# Patient Record
Sex: Female | Born: 1935 | Race: White | Hispanic: No | Smoking: Never smoker
Health system: Southern US, Community
[De-identification: ages and names within clinical notes are randomized; demographics above are authoritative.]

## PROBLEM LIST (undated history)

## (undated) DIAGNOSIS — I10 Essential (primary) hypertension: Secondary | ICD-10-CM

## (undated) DIAGNOSIS — E785 Hyperlipidemia, unspecified: Secondary | ICD-10-CM

## (undated) DIAGNOSIS — H3530 Unspecified macular degeneration: Secondary | ICD-10-CM

## (undated) DIAGNOSIS — M810 Age-related osteoporosis without current pathological fracture: Secondary | ICD-10-CM

## (undated) DIAGNOSIS — K922 Gastrointestinal hemorrhage, unspecified: Secondary | ICD-10-CM

## (undated) DIAGNOSIS — M48061 Spinal stenosis, lumbar region without neurogenic claudication: Secondary | ICD-10-CM

## (undated) HISTORY — PX: JOINT REPLACEMENT: SHX530

## (undated) HISTORY — PX: ABDOMINAL HYSTERECTOMY: SHX81

---

## 2004-11-13 ENCOUNTER — Ambulatory Visit: Payer: Self-pay

## 2005-06-28 ENCOUNTER — Ambulatory Visit: Payer: Self-pay | Admitting: Internal Medicine

## 2006-07-04 ENCOUNTER — Ambulatory Visit: Payer: Self-pay | Admitting: Internal Medicine

## 2007-07-09 ENCOUNTER — Ambulatory Visit: Payer: Self-pay | Admitting: Internal Medicine

## 2008-07-13 ENCOUNTER — Ambulatory Visit: Payer: Self-pay | Admitting: Internal Medicine

## 2009-06-27 ENCOUNTER — Ambulatory Visit: Payer: Self-pay | Admitting: Internal Medicine

## 2009-08-02 ENCOUNTER — Ambulatory Visit: Payer: Self-pay | Admitting: Pain Medicine

## 2009-08-30 ENCOUNTER — Ambulatory Visit: Payer: Self-pay | Admitting: Pain Medicine

## 2009-09-09 ENCOUNTER — Ambulatory Visit: Payer: Self-pay | Admitting: Internal Medicine

## 2009-09-12 ENCOUNTER — Ambulatory Visit: Payer: Self-pay | Admitting: Pain Medicine

## 2010-01-03 ENCOUNTER — Ambulatory Visit: Payer: Self-pay | Admitting: Pain Medicine

## 2010-01-19 ENCOUNTER — Ambulatory Visit: Payer: Self-pay | Admitting: Pain Medicine

## 2010-09-27 ENCOUNTER — Ambulatory Visit: Payer: Self-pay | Admitting: Internal Medicine

## 2011-12-20 LAB — COMPREHENSIVE METABOLIC PANEL
Alkaline Phosphatase: 86 U/L (ref 50–136)
BUN: 23 mg/dL — ABNORMAL HIGH (ref 7–18)
Bilirubin,Total: 0.6 mg/dL (ref 0.2–1.0)
Calcium, Total: 9.9 mg/dL (ref 8.5–10.1)
Co2: 27 mmol/L (ref 21–32)
EGFR (Non-African Amer.): >60
Glucose: 108 mg/dL — ABNORMAL HIGH (ref 65–99)
Osmolality: 284 (ref 275–301)
SGOT(AST): 20 U/L (ref 15–37)
SGPT (ALT): 18 U/L
Total Protein: 8.3 g/dL — ABNORMAL HIGH (ref 6.4–8.2)

## 2011-12-20 LAB — CBC
HCT: 44.7 % (ref 35.0–47.0)
MCV: 93 fL (ref 80–100)

## 2011-12-21 ENCOUNTER — Observation Stay: Payer: Self-pay | Admitting: Internal Medicine

## 2011-12-21 LAB — URINALYSIS, COMPLETE
Blood: NEGATIVE
Glucose,UR: NEGATIVE mg/dL (ref 0–75)
Nitrite: POSITIVE
RBC,UR: 5 /HPF (ref 0–5)
Specific Gravity: 1.009 (ref 1.003–1.030)
Squamous Epithelial: 2

## 2011-12-22 LAB — CBC WITH DIFFERENTIAL/PLATELET
Basophil %: 0.2 %
Eosinophil #: 0 10*3/uL (ref 0.0–0.7)
Eosinophil %: 0.1 %
HCT: 35.2 % (ref 35.0–47.0)
Lymphocyte #: 2 10*3/uL (ref 1.0–3.6)
MCHC: 34.1 g/dL (ref 32.0–36.0)
MCV: 93 fL (ref 80–100)
Monocyte #: 0.8 x10 3/mm (ref 0.2–0.9)
Neutrophil %: 75.3 %
Platelet: 320 10*3/uL (ref 150–440)
RBC: 3.8 10*6/uL (ref 3.80–5.20)
WBC: 11.6 10*3/uL — ABNORMAL HIGH (ref 3.6–11.0)

## 2012-06-03 ENCOUNTER — Inpatient Hospital Stay: Payer: Self-pay | Admitting: Orthopedic Surgery

## 2012-06-03 DIAGNOSIS — I10 Essential (primary) hypertension: Secondary | ICD-10-CM

## 2012-06-03 LAB — BASIC METABOLIC PANEL
Anion Gap: 10 (ref 7–16)
BUN: 29 mg/dL — ABNORMAL HIGH (ref 7–18)
Calcium, Total: 8.9 mg/dL (ref 8.5–10.1)
EGFR (Non-African Amer.): >60
Glucose: 107 mg/dL — ABNORMAL HIGH (ref 65–99)
Osmolality: 284 (ref 275–301)
Potassium: 3.5 mmol/L (ref 3.5–5.1)
Sodium: 139 mmol/L (ref 136–145)

## 2012-06-03 LAB — URINALYSIS, COMPLETE
Bilirubin,UR: NEGATIVE
Blood: NEGATIVE
Glucose,UR: NEGATIVE mg/dL (ref 0–75)
Ketone: NEGATIVE
Leukocyte Esterase: NEGATIVE
Nitrite: POSITIVE
Squamous Epithelial: NONE SEEN

## 2012-06-03 LAB — CBC
MCH: 31.5 pg (ref 26.0–34.0)
MCHC: 34.3 g/dL (ref 32.0–36.0)
Platelet: 251 10*3/uL (ref 150–440)
RBC: 4.6 10*6/uL (ref 3.80–5.20)

## 2012-06-04 LAB — CBC WITH DIFFERENTIAL/PLATELET
Basophil #: 0.1 10*3/uL (ref 0.0–0.1)
Eosinophil %: 0.1 %
HCT: 34.2 % — ABNORMAL LOW (ref 35.0–47.0)
HGB: 12.2 g/dL (ref 12.0–16.0)
Lymphocyte #: 0.8 10*3/uL — ABNORMAL LOW (ref 1.0–3.6)
Lymphocyte %: 7.1 %
MCH: 32.6 pg (ref 26.0–34.0)
MCHC: 35.6 g/dL (ref 32.0–36.0)
Monocyte %: 6.4 %
Neutrophil #: 10.2 10*3/uL — ABNORMAL HIGH (ref 1.4–6.5)
Neutrophil %: 85.9 %
Platelet: 203 10*3/uL (ref 150–440)
RBC: 3.74 10*6/uL — ABNORMAL LOW (ref 3.80–5.20)

## 2012-06-04 LAB — BASIC METABOLIC PANEL
Anion Gap: 10 (ref 7–16)
BUN: 21 mg/dL — ABNORMAL HIGH (ref 7–18)
Creatinine: 0.84 mg/dL (ref 0.60–1.30)
EGFR (African American): >60
EGFR (Non-African Amer.): 59 — ABNORMAL LOW
Glucose: 162 mg/dL — ABNORMAL HIGH (ref 65–99)
Sodium: 142 mmol/L (ref 136–145)

## 2012-06-06 LAB — URINE CULTURE

## 2012-06-07 ENCOUNTER — Encounter: Payer: Self-pay | Admitting: Internal Medicine

## 2012-06-20 LAB — URINALYSIS, COMPLETE
Bilirubin,UR: NEGATIVE
Blood: NEGATIVE
Hyaline Cast: 3
Ketone: NEGATIVE
Ph: 5 (ref 4.5–8.0)
RBC,UR: 6 /HPF (ref 0–5)
WBC UR: 42 /HPF (ref 0–5)

## 2012-06-22 LAB — URINE CULTURE

## 2012-06-27 ENCOUNTER — Encounter: Payer: Self-pay | Admitting: Internal Medicine

## 2012-07-13 LAB — URINALYSIS, COMPLETE
Bacteria: NONE SEEN
Bilirubin,UR: NEGATIVE
Blood: NEGATIVE
Glucose,UR: NEGATIVE mg/dL (ref 0–75)
Nitrite: NEGATIVE
Ph: 7 (ref 4.5–8.0)
RBC,UR: 1 /HPF (ref 0–5)
Specific Gravity: 1.016 (ref 1.003–1.030)
Squamous Epithelial: 3

## 2012-07-14 LAB — URINE CULTURE

## 2012-07-27 ENCOUNTER — Encounter: Payer: Self-pay | Admitting: Internal Medicine

## 2013-01-03 ENCOUNTER — Emergency Department: Payer: Self-pay | Admitting: Emergency Medicine

## 2013-06-04 ENCOUNTER — Emergency Department: Payer: Self-pay | Admitting: Emergency Medicine

## 2014-03-10 ENCOUNTER — Emergency Department: Payer: Self-pay | Admitting: Emergency Medicine

## 2014-12-14 NOTE — Consult Note (Signed)
PATIENT NAME:  SIEDAH, SEDOR MR#:  [id number] DATE OF BIRTH:  [birth year]  DATE OF CONSULTATION:  06/03/2012  REFERRING PHYSICIAN:  Dr. Rosita Kea  CONSULTING PHYSICIAN:  Shaune Pollack, MD  PRIMARY CARE PHYSICIAN: Yates Decamp, III, MD   REASON FOR CONSULTATION: Preop evaluation and medical management.   HISTORY OF PRESENT ILLNESS: The patient is a 81 year old Caucasian female with history of hypertension, hyperlipidemia, osteoporosis, osteoarthritis, glaucoma, and lumbar stenosis who was brought to the ED due to fall and she was noted to have a right hip fracture and Dr. Rosita Kea requested preop evaluation and medical management. I examined the patient at bedside. According to the patient's home healthcare giver, the patient fell by accident this morning. The patient usually uses a walker. She is living alone. Has a good appetite and daily activity recently. The patient denies any fever, chills, headache, or dizziness. No incontinence. No loss of consciousness. No dysphagia, slurred speech, or weakness. The patient has right hip pain. Denies any other symptoms.   PAST MEDICAL HISTORY:  1. Hypertension. 2. Hyperlipidemia.  3. Sarcoidosis.  4. Osteoporosis.  5. Glaucoma. 6. Macular degeneration. 7. Lumbar stenosis.  8. Degenerative disk disease.  9. Chronic back pain.   PAST SURGICAL HISTORY: 1. Repair of vaginal prolapse. 2. Laminectomy in 1995. 3. Bilateral knee replacements.  4. Lens implant.  5. Ovarian tumor resection, unknown benign or malignant.   SOCIAL HISTORY: No smoking, drinking, or illicit drugs. Living alone.   FAMILY HISTORY: Hypertension.   REVIEW OF SYSTEMS: CONSTITUTIONAL: The patient denies any fever, chills. No headache or dizziness. No weakness. EYES: No double vision or blurred vision. ENT: No postnasal drip, slurred speech, or dysphagia. CARDIOVASCULAR: No chest pain, palpitation, orthopnea, or nocturnal dyspnea. PULMONARY: No cough, sputum, shortness of breath, or  hemoptysis. GI: No abdominal pain, nausea, vomiting, or diarrhea. No melena or bloody stools. SKIN: No rash or jaundice. GU: No dysuria, hematuria, or incontinence. NEUROLOGY: No syncope, loss of consciousness, or seizure. HEMATOLOGY: No easy bruising or bleeding.   HOME MEDICATIONS:  1. PreserVision oral capsule one capsule twice daily.   2. Moexipril 15 mg p.o. daily.  3. Latanoprost 0.005% one drop to each eye at bedtime.  4. Fish Oil 1 cap b.i.d.  5. Chlorthalidone 25 mg p.o. once daily.  6. Centrum Silver p.o. tablet 1 tablet p.o. daily.  7. Aspirin 81 mg p.o. daily.  8. Norvasc 5 mg p.o. daily.  9. Acetaminophen/hydrocodone 325 mg/5 mg p.o. tablet 1 tablet once daily at bedtime p.r.n.   PHYSICAL EXAMINATION:   VITAL SIGNS: Temperature 97.6, blood pressure 136/60, pulse 74, respirations 18, oxygen saturation 94% on room air.   GENERAL: The patient is alert, awake, oriented in no acute distress.   HEENT: Pupils round, equal, reactive to light and accommodation. Mild dry oral mucosa. Clear oropharynx.   NECK: Supple. No JVD or carotid bruits. No lymphadenopathy. No thyromegaly.   CARDIOVASCULAR: S1, S2 regular rate and rhythm. No murmurs or gallops.   PULMONARY: Bilateral air entry. No wheezing or rales. No accessory muscles to breathe.   ABDOMEN: Soft. No distention or tenderness. No organomegaly. Bowel sounds present.   EXTREMITIES: No edema, clubbing, or cyanosis. No calf tenderness. The patient cannot move right lower extremity but can move left lower extremity. Sensation intact.   NEUROLOGY: Alert and oriented x3. No focal deficits.   LABORATORY, DIAGNOSTIC, AND RADIOLOGICAL DATA: WBC 17.1, hemoglobin 14.5, platelets 251, glucose 107, BUN 29, creatinine 0.72. Electrolytes are normal. Urinalysis is nitrite positive.  Chest x-ray no acute cardiopulmonary disease.   Pelvis x-ray showed right hip fracture.   Right hip x-ray showed right hip fracture.   CAT scan of head  showed atrophy with chronic microangiopathy. No acute intracranial abnormality.   EKG showed normal sinus rhythm with left axis deviation, right bundle branch block.   IMPRESSION:  1. Right hip fracture.  2. Fall.  3. Dehydration.  4. Leukocytosis.  5. Urinary tract infection.  6. Hypertension.  7. Hyperlipidemia.  8. Osteoarthritis.  9. Osteoporosis.   RECOMMENDATIONS:  1. The patient has hypertension, hyperlipidemia, urinary tract infection, has a moderate risk for hip fracture surgery.   2. The patient is to hold aspirin.  3. Since the patient has dehydration, we will hold chlorthalidone and give normal saline IV and follow-up BMP.  4. For urinary tract infection, leukocytosis, we will give Rocephin IVPB and follow-up CBC, urine culture.  5. For hypertension, will continue Norvasc and moexipril. 6. The patient needs DVT prophylaxis after surgery.  Discussed the patient's situation and recommendations with the patient and the patient's nephew.   TIME SPENT: About 60 minutes.   ____________________________ Shaune PollackQing [name], MD qc:drc D: 06/03/2012 16:20:33 ET T: 06/03/2012 16:46:01 ET JOB#: [id number]  cc: Shaune PollackQing Geselle Cardosa, MD, <Dictator> Shaune PollackQING Ramone Gander MD ELECTRONICALLY SIGNED 06/03/2012 20:34

## 2014-12-14 NOTE — H&P (Signed)
PATIENT NAME:  [name], [name] MR#:  [id number] DATE OF BIRTH:  June 01, 1913  DATE OF ADMISSION:  06/03/2012  CHIEF COMPLAINT: Right hip pain.  HISTORY OF PRESENT ILLNESS: The patient is a 81 year old who has been ambulatory around her home with a walker for some time. She is very alert and active. She has been generally healthy. She does not know why she fell. She thinks the hip may have broken first. She has had problems in the past with hip pain and bilateral hip pain. She had admission back in April because of concerns about possible hip fracture that was negative at that time. She had fall at home. Denies any loss of consciousness, chest pain, and has been evaluated by the ER staff and was found to have a minimally displaced intertrochanteric right hip fracture.   PAST MEDICAL HISTORY:  1. Sarcoidosis.  2. Hyperlipidemia.  3. Hypertension.  4. Osteoporosis.  5. Lumbar spinal stenosis. 6. Glaucoma.   7. Macular degeneration.  8. Prior bilateral knee replacement.  9. Eye surgery.   10. Prior laminectomy.  11. She had ovarian tumor resection in the past.   ALLERGIES: Septra, Celebrex, Naldecon.   MEDICATIONS ON ADMISSION:  1. PreserVision oral capsule one p.o. b.i.d.  2. Xalatan 0.005% eyedrops one drop to each eye at bedtime. 3. Moexipril 15 mg p.o. daily.  4. Fish Oil daily.   5. Chlorthalidone 25 mg daily.  6. Multivitamin daily.  7. Aspirin 81 mg p.o. daily.  8. Amlodipine 5 mg daily.  9. Vicodin 5 one p.o. as needed for pain.   SOCIAL HISTORY: She is a nonsmoker and lives alone.  REVIEW OF SYSTEMS: Positive just for the hip pain. Denies any other problem.   PHYSICAL EXAMINATION:   GENERAL: Slender white female who appears younger than her stated age in mild distress secondary to hip pain.   HEENT: Remarkable for teeth in good condition.   LUNGS: Clear.   HEART: Regular rate and rhythm.   ABDOMEN: Soft, nontender.  EXTREMITIES: Remarkable for external rotation of  the right lower extremity. She does not have significant edema of the right foot. She does have intact pulses and is able to flex and extend the toes. Skin over the right hip is intact.   X-rays show nondisplaced intertrochanteric hip fracture.   CLINICAL IMPRESSION: Right intertrochanteric hip fracture in a previously ambulatory woman.   RECOMMENDATION: Preop medical evaluation and ORIF today. She has not eaten pretty much all day and we could mobilize her tomorrow. Risks, benefits, and possible complications were discussed. Recommend repair so that she can return home in 3 to 6 weeks after rehab stay.    ____________________________ Leitha SchullerMichael J. Dquan Cortopassi, MD mjm:drc D: 06/03/2012 16:49:46 ET T: 06/03/2012 17:27:08 ET JOB#: [id number]  cc: Leitha SchullerMichael J. Almarie Kurdziel, MD, <Dictator> Leitha SchullerMICHAEL J Kaili Castille MD ELECTRONICALLY SIGNED 06/03/2012 17:55

## 2014-12-14 NOTE — Op Note (Signed)
PATIENT NAME:  [name], [name] MR#:  [id number] DATE OF BIRTH:  1913/04/15  DATE OF Della GooROCEDURE:  06/03/2012  PREOPERATIVE DIAGNOSIS: Right intertrochanteric hip fracture.   POSTOPERATIVE DIAGNOSIS: Right intertrochanteric hip fracture.   PROCEDURE: Right femur open reduction and internal fixation with intramedullary rod and screw.   SURGEON: Leitha SchullerMichael J. Ennis Delpozo, M.D.   ANESTHESIA: General.   DESCRIPTION OF PROCEDURE:  The patient was brought to the operating room and after adequate general anesthesia was obtained, the patient was transferred to the fracture table. The left leg was in the well legholder, right leg in the traction boot with minimal traction applied. The C-arm was brought in and essentially anatomic reduction was obtained with closed means. The hip was prepped and draped using the barrier drape method. Appropriate patient identification and time-out procedures were completed. A proximal incision was made proximal to the greater trochanter. A guidewire was inserted in a center position on the lateral to the tip of the trochanter. Proximal reaming was carried out over this with a soft tissue protector in place. A long guidewire was then inserted, and measuring off this the long [employer] rod was obtained. Reaming was carried out to 12 mm and then the right 130-degree 11 x 360-mm rod was inserted to the appropriate depth. A small lateral incision was made and through the guide a guidewire was inserted up into the center of the head in AP and lateral projections. This was measured, drilled, and 100-mm screw was inserted. After this set to the appropriate depth the guidewire was removed and the set screw tightened and then released to allow for compression and loading of the fracture site. The wounds were then thoroughly irrigated. The insertion handle was removed. The deep fascia was closed using #1 Vicryl, 2-0 Vicryl subcutaneously, and skin staples. Xeroform, 4 x 4's, abdomen, and tape were  applied. The patient was sent to the recovery room in stable condition.   ESTIMATED BLOOD LOSS: 100 mL.   COMPLICATIONS: None.   SPECIMEN: None.     ____________________________ Leitha SchullerMichael J. Miria Cappelli, MD mjm:bjt D: 06/03/2012 22:14:18 ET T: 06/04/2012 08:36:32 ET JOB#: [id number]  cc: Leitha SchullerMichael J. Authur Cubit, MD, <Dictator> Leitha SchullerMICHAEL J Philopater Mucha MD ELECTRONICALLY SIGNED 06/04/2012 13:12

## 2014-12-14 NOTE — Discharge Summary (Signed)
PATIENT NAME:  [name], Uriyah L MR#:  [id number] DATE OF BIRTH:  12-16-1912  DATE OF ADMISSION:  06/03/2012 DATE OF DISCHARGE:  06/06/2012  ADMITTING DIAGNOSIS: Right intertrochanteric hip fracture.   DISCHARGE DIAGNOSIS: Right intertrochanteric hip fracture.   PROCEDURE: Right femur open reduction internal fixation with intramedullary rod and screw.   SURGEON: Leitha SchullerMichael J. Menz, MD   HISTORY: The patient is a 81 year old who had been ambulatory around her home with a walker for sometime. She was very alert and active. She was generally healthy. She does not know why she fell. She thinks her hip may have broken first. She has had problems in the past with hip pain and bilateral hip pain. She had admission back in April because of concerns about possible hip fracture that was negative at the time. She had a fall home. She denies any loss of consciousness or chest pain and has been evaluated by the ER staff and was found to have a minimally displaced intertrochanteric right hip fracture.   PHYSICAL EXAMINATION: GENERAL: Slender white female who appears younger than her stated age, in mild distress secondary to hip pain. HEENT: Remarkable for teeth in good condition. LUNGS: Clear. HEART: Regular rate and rhythm. ABDOMEN: Soft, nontender. EXTREMITIES: Remarkable for external rotation of the right lower extremity. She does not have significant edema of the right foot. She does have intact pulses and is able to flex and extend the toes. Skin over the right hip is intact.   HOSPITAL COURSE: The patient was admitted to the hospital on 06/03/2012. She had surgery that same day and was brought to the orthopedic floor from the PAC-U in stable condition. for the next three days the patient had slow progress with physical therapy. Her labs and vital signs remained stable. Her pain was well controlled. On 06/06/2012, the patient was stable and ready for discharge to rehab.   CONDITION AT DISCHARGE: Stable.    DISCHARGE INSTRUCTIONS: The patient should wear TED hose thigh-high bilaterally. She should have log roll every two hours. She should keep the foot of the bed elevated at 35 degrees with knees slightly bent. Physical therapy should consult and evaluate for difficulty walking, right weight-bearing as tolerated. OT should evaluate and treat for muscle weakness and assistance with activities of daily living. She should resume a regular diet and she [facility]   DISCHARGE MEDICATIONS:  1. Multivitamin with minerals tablet 1 tablet oral daily.  2. Vicodin 5/325 mg 1 to 2 tablets oral every 6 hours p.r.n. pain.  3. Tylenol 500 to 1000 mg oral every 4 hours p.r.n. pain or temperature greater than 100.4.  4. Docusate calcium capsule 240 mg oral at bedtime. 5. Dulcolax suppository 10 mg rectal every 6 hours p.r.n. constipation.  6. Latanoprost 0.005% ophthalmic drops one drop to both eyes at bedtime.  7. Amlodipine tablet 5 mg oral daily. 8. Chlorthalidone tablet 25 mg oral daily. 9. Moexipril 15 mg oral daily. 10. Cipro 250 mg oral every 12 hours, stop after seven days.  11. Lovenox 40 mg daily x14 days.  ____________________________ Evon Slackhomas C. Dimples Probus, PA-C tcg:slb D: 06/06/2012 07:50:29 ET T: 06/06/2012 13:02:16 ET JOB#: [id number]  cc: Evon Slackhomas C. Juston Goheen, PA-C, <Dictator> Evon SlackHOMAS C Asia Dusenbury GeorgiaPA ELECTRONICALLY SIGNED 06/10/2012 12:36

## 2014-12-19 NOTE — Consult Note (Signed)
Brief Consult Note: Diagnosis: left hip bursitis.   Patient was seen by consultant.   Recommend further assessment or treatment.   Comments: 81 year old with increased left hip pain yesterday causing inability to walk.  Admitted for evaluation and treatment.  Had injection left hip bursa this AM by Dr Bethann PunchesMark Mardene Lessig and pain has resolved.  Saw Dr Rosita KeaMenz last week and had been taking otc naproxyn sodium bid.   Exam:  alert, oriented.  full range of motion of left hip without pain.  non tender both trochanteric regions now.  circulation/sensation/motor function good distally.  no pelvic or back tenderness.    X-rays: ct scan of hip showed no fractures and mild arthritis.   Rx:  Ice        Switch to mobic 7.5 mg/ day or Celebrex 200 mg/ day.         ambulate with Physical Therapy.        return to  clinic to see me or Dr Rosita KeaMenz as needed.  Electronic Signatures: Valinda HoarMiller, Malike Foglio E (MD)  (Signed 26-Apr-13 14:17)  Authored: Brief Consult Note   Last Updated: 26-Apr-13 14:17 by Valinda HoarMiller, Tressy Kunzman E (MD)

## 2014-12-19 NOTE — Discharge Summary (Signed)
PATIENT NAME:  [name], Leah L MR#:  Yu DATE OF BIRTH:  [birth year]  DATE OF ADMISSION:  12/21/2011 DATE OF DISCHARGE:  12/23/2011  NOTE: Patient was placed in observation, she was not admitted. She was placed in observation 12/21/2011, she was discharged 12/23/2011.   DISCHARGE DIAGNOSES:  1. Hip pain.  2. Urinary tract infection.   DISCHARGE MEDICATIONS:  1. Aspirin daily.  2. Multivitamin daily.  3. Fish oil daily.  4. Naproxen over-the-counter p.r.n.  5. Ceftin 250 b.i.d. for five days for urinary tract infection.   HISTORY AND PHYSICAL: Please see detailed history done on admission.   HOSPITAL COURSE: Patient was admitted by Dr. Hyacinth MeekerMiller and Dr. Tilman NeatWalker's patient. I saw her for a day or two on the weekend. She did well after injection of her hip by Dr. Hyacinth MeekerMiller. CT of her hip was negative for fracture. Urinalysis was done on admission but urine culture was never done. She is ambulating without difficulty by the time of discharge. No other complications.   ____________________________ Marya AmslerMarshall W. Dareen PianoAnderson, MD mwa:cms D: 01/01/2012 07:26:20 ET T: 01/01/2012 12:06:49 ET JOB#: [id number]  cc: Marya AmslerMarshall W. Dareen PianoAnderson, MD, <Dictator> Lauro RegulusMARSHALL W ANDERSON MD ELECTRONICALLY SIGNED 01/07/2012 7:00

## 2014-12-19 NOTE — H&P (Signed)
PATIENT NAME:  [name], [name] MR#:  [id number] DATE OF BIRTH:  04-16-1913  DATE OF ADMISSION:  12/21/2011  REFERRING PHYSICIAN: Dr. Buford Yu PRIMARY CARE PHYSICIAN: Dr. Yates DecampJohn Yu  PRESENTING COMPLAINT: Bilateral hip pain.   HISTORY OF PRESENT ILLNESS: Ms. [name] is a pleasant 81 year old woman with history of lumbar stenosis and degenerative disk disease with chronic low back pain, osteoarthritis, hypertension, hyperlipidemia, sarcoidosis, osteoporosis who presents with reports of bilateral hip pain. Patient reports that approximately 3 to 4 weeks ago she started having right knee pain with extension up to her right hip. She has been tolerating her pain and has been progressively worsened. She presented to Dr. Rosita Yu' office on 12/14/2011 for evaluation of hip pain who recommended symptom control with hydrocodone. Patient reports that last night she was attempting to get up from her chair when her right leg gave out. She was able to support herself where she did not fall to the floor and was able to position herself back on the couch but from that patient had significant pain to her left hip which was the inciting factor for making the EMS call. She reports that typically she ambulates with a Yu and is even able to drive herself to the bank and to the gas station but due to her recent issues has not done that. Currently she is still having left leg and hip pain with some low back pain. Patient denied any chest pain, shortness of breath, presyncope, or syncope.   PAST MEDICAL HISTORY:  1. Lumbar stenosis/degenerative disk disease.  2. Osteoarthritis.  3. Glaucoma.  4. Chronic low back pain.  5. Hypertension.  6. Hyperlipidemia.  7. Sarcoidosis.  8. Osteoporosis.  9. Macular degeneration.   PAST SURGICAL HISTORY:  1. Repair of vaginal prolapse.  2. Laminectomy in 1995.  3. Bilateral knee replacement.  4. Lens implant.  5. Ovarian tumor resection, unknown benign or malignant.   ALLERGIES:  Septra Celebrex, Naldecon.    MEDICATIONS:  1. Hydrocodone as needed.  2. Amlodipine 5 mg daily.  3. Aspirin 81 mg daily.  4. Chlorthalidone 25 mg b.i.d.  5. Moexipril 15 mg daily.  6. Centrum Silver daily.  7. Fish oil 2 tablets daily.  8. Xalatan 0.005% one drop both eyes at bedtime.   FAMILY HISTORY: Mother with hypertension.   SOCIAL HISTORY: She lives in [name] alone. She ambulates with a Yu. Two of her neighbors are in the room. Reports that her nephew who lives in [name] is her only surviving family and is her power of attorney.   REVIEW OF SYSTEMS: CONSTITUTIONAL: No fevers, nausea, or vomiting. EYES: History of glaucoma. ENT: No epistaxis or discharge. She wears a hearing aid. RESPIRATORY: No cough, wheezing, hemoptysis. CARDIOVASCULAR: No chest pain, orthopnea, palpitations, or syncope. GASTROINTESTINAL: No nausea, vomiting, diarrhea. She actually has constipation. No hematemesis or melena. GENITOURINARY: No dysuria, hematuria. ENDO: No polyuria or polydipsia. HEMATOLOGIC: She has easy bruising. SKIN: She has a chronic rash on her right anterior shin. NEUROLOGIC: No history of strokes or seizures. PSYCH: Denies any depression or suicidal ideation.   PHYSICAL EXAMINATION:  VITAL SIGNS: Temperature 98.3, pulse 93, respiratory rate 18, blood pressure 159/72, sating 97% on room air.   GENERAL: Lying in bed in no apparent distress.   HEENT: Normocephalic, atraumatic. Pupils equal, symmetric, nonicteric. Nares without discharge. Moist mucous membrane.   NECK: Soft and supple. No adenopathy or JVP.   CARDIOVASCULAR: Non-tachy. No murmurs, rubs, or gallops.   LUNGS: Clear  to auscultation bilaterally. No use of accessory muscles or increased respiratory effort.   ABDOMEN: Soft. Positive bowel sounds. No mass appreciated.   EXTREMITIES: Edema of the left leg greater than the right which patient reports is chronic. She has decreased mobilization and with  manipulation of her hips has reproducible pain. Dorsal pedis pulses intact.   NEUROLOGIC: No dysarthria or aphasia. Symmetrical strength of upper extremities.   PSYCH: She is alert and oriented. Patient is cooperative.   LABORATORY, DIAGNOSTIC AND RADIOLOGICAL DATA: CT of the hip without any evidence of fracture. Glucose 108, BUN 23, creatinine 0.70, sodium 140, potassium 3.5, chloride 104, carbon dioxide 27, calcium 9.9. LFTs within normal limits. Total protein 8.3, WBC 12.8, hemoglobin 14.9, hematocrit 44.7, platelets 402, MCV 93. Pelvic x-ray with minimal sclerosis in the inferior pubic region on the right. Degenerative changes noted in the lower lumbar spine and lumbosacral region as well as the sacroiliac joints degenerative changes are noted.   ASSESSMENT AND PLAN: Ms. [name] is a 81 year old woman with history of chronic back pain, degenerative disk disease and spinal stenosis, osteoarthritis, glaucoma, hypertension, hyperlipidemia, sarcoidosis, osteoporosis presenting with bilateral hip pain and poor ambulatory effort.  1. Ambulatory dysfunction. Her pain is in the setting of her baseline chronic issues with osteoarthritis and now with acute injury and likely bursitis of the left hip. She will need to be evaluated with PT and obtain care management consultation. Continue with pain control. CT scan does not show any fracture but will get Dr. Rosita Kea to evaluate patient for possibility of joint injection to help with pain control. For now restart her on her Naprosyn and hydrocodone she has been taking this to help with her symptoms and has not had any complications even though she is allergic to Celebrex in [facility] notes. Will also add on morphine as needed. Patient would likely benefit from rehabilitation prior to returning home as she lives alone.  2. Hypertension. Restart amlodipine, chlorthalidone and moexipril.  3. Hyperlipidemia. Restart her fish oil.  4. Glaucoma. Restart Xalatan.   5. Prophylaxis with Lovenox and aspirin   TIME SPENT: Approximately 45 minutes spent on patient care.   ____________________________ Reuel Derby, MD ap:cms D: 12/21/2011 01:26:49 ET T: 12/21/2011 09:31:50 ET JOB#: [id number]  cc: Pearlean Brownie Nakaya Mishkin, MD, <Dictator> [place] B. Danne [place], MD Reuel Derby MD ELECTRONICALLY SIGNED 12/24/2011 0:43

## 2015-01-31 ENCOUNTER — Emergency Department: Payer: Medicare Other

## 2015-01-31 ENCOUNTER — Encounter: Payer: Self-pay | Admitting: Emergency Medicine

## 2015-01-31 ENCOUNTER — Emergency Department
Admission: EM | Admit: 2015-01-31 | Discharge: 2015-01-31 | Disposition: A | Discharge status: Home / Self Care | Payer: Medicare Other | Attending: Emergency Medicine | Admitting: Emergency Medicine

## 2015-01-31 DIAGNOSIS — S0003XA Contusion of scalp, initial encounter: Secondary | ICD-10-CM

## 2015-01-31 DIAGNOSIS — W01198A Fall on same level from slipping, tripping and stumbling with subsequent striking against other object, initial encounter: Secondary | ICD-10-CM | POA: Diagnosis not present

## 2015-01-31 DIAGNOSIS — S51001A Unspecified open wound of right elbow, initial encounter: Secondary | ICD-10-CM | POA: Insufficient documentation

## 2015-01-31 DIAGNOSIS — S81812A Laceration without foreign body, left lower leg, initial encounter: Secondary | ICD-10-CM | POA: Diagnosis not present

## 2015-01-31 DIAGNOSIS — S81811A Laceration without foreign body, right lower leg, initial encounter: Secondary | ICD-10-CM | POA: Diagnosis not present

## 2015-01-31 DIAGNOSIS — Y9389 Activity, other specified: Secondary | ICD-10-CM | POA: Insufficient documentation

## 2015-01-31 DIAGNOSIS — Z90710 Acquired absence of both cervix and uterus: Secondary | ICD-10-CM | POA: Insufficient documentation

## 2015-01-31 DIAGNOSIS — I10 Essential (primary) hypertension: Secondary | ICD-10-CM | POA: Diagnosis not present

## 2015-01-31 DIAGNOSIS — E785 Hyperlipidemia, unspecified: Secondary | ICD-10-CM | POA: Insufficient documentation

## 2015-01-31 DIAGNOSIS — M4806 Spinal stenosis, lumbar region: Secondary | ICD-10-CM | POA: Insufficient documentation

## 2015-01-31 DIAGNOSIS — M810 Age-related osteoporosis without current pathological fracture: Secondary | ICD-10-CM | POA: Insufficient documentation

## 2015-01-31 DIAGNOSIS — Y9289 Other specified places as the place of occurrence of the external cause: Secondary | ICD-10-CM | POA: Diagnosis not present

## 2015-01-31 DIAGNOSIS — S0083XA Contusion of other part of head, initial encounter: Secondary | ICD-10-CM | POA: Diagnosis not present

## 2015-01-31 DIAGNOSIS — H3530 Unspecified macular degeneration: Secondary | ICD-10-CM | POA: Insufficient documentation

## 2015-01-31 DIAGNOSIS — Y998 Other external cause status: Secondary | ICD-10-CM | POA: Diagnosis not present

## 2015-01-31 DIAGNOSIS — S8991XA Unspecified injury of right lower leg, initial encounter: Secondary | ICD-10-CM | POA: Diagnosis present

## 2015-01-31 DIAGNOSIS — S51011A Laceration without foreign body of right elbow, initial encounter: Secondary | ICD-10-CM

## 2015-01-31 HISTORY — DX: Age-related osteoporosis without current pathological fracture: M81.0

## 2015-01-31 HISTORY — DX: Essential (primary) hypertension: I10

## 2015-01-31 HISTORY — DX: Unspecified macular degeneration: H35.30

## 2015-01-31 HISTORY — DX: Spinal stenosis, lumbar region without neurogenic claudication: M48.061

## 2015-01-31 HISTORY — DX: Hyperlipidemia, unspecified: E78.5

## 2015-01-31 MED ORDER — OXYCODONE-ACETAMINOPHEN 5-325 MG PO TABS
0.5000 | ORAL_TABLET | Freq: Once | ORAL | Status: AC
  Administered 2015-01-31: 0.5 via ORAL

## 2015-01-31 MED ORDER — ACETAMINOPHEN 500 MG PO TABS
500.0000 mg | ORAL_TABLET | Freq: Once | ORAL | Status: AC
  Administered 2015-01-31: 500 mg via ORAL

## 2015-01-31 MED ORDER — CEPHALEXIN 500 MG PO CAPS
500.0000 mg | ORAL_CAPSULE | Freq: Once | ORAL | Status: AC
  Administered 2015-01-31: 500 mg via ORAL

## 2015-01-31 MED ORDER — CEPHALEXIN 500 MG PO CAPS
ORAL_CAPSULE | ORAL | Status: AC
  Administered 2015-01-31: 500 mg via ORAL
  Filled 2015-01-31: qty 1

## 2015-01-31 MED ORDER — LIDOCAINE HCL (PF) 1 % IJ SOLN
10.0000 mL | Freq: Once | INTRAMUSCULAR | Status: AC
  Administered 2015-01-31: 10 mL via INTRADERMAL

## 2015-01-31 MED ORDER — CEPHALEXIN 500 MG PO CAPS: 500.0000 mg | ORAL_CAPSULE | Freq: Three times a day (TID) | ORAL | Status: AC

## 2015-01-31 MED ORDER — ACETAMINOPHEN 500 MG PO TABS
ORAL_TABLET | ORAL | Status: AC
  Administered 2015-01-31: 500 mg via ORAL
  Filled 2015-01-31: qty 1

## 2015-01-31 MED ORDER — LIDOCAINE HCL (PF) 1 % IJ SOLN
INTRAMUSCULAR | Status: AC
  Administered 2015-01-31: 10 mL via INTRADERMAL
  Filled 2015-01-31: qty 10

## 2015-01-31 MED ORDER — OXYCODONE-ACETAMINOPHEN 5-325 MG PO TABS
ORAL_TABLET | ORAL | Status: AC
  Administered 2015-01-31: 0.5 via ORAL
  Filled 2015-01-31: qty 1

## 2015-01-31 NOTE — ED Notes (Signed)
Pt here [facility] [place] after fall; laundry door got stuck on walker. Pt with abrasions to bilateral shins, hematoma to forehead, skin tear to right elbow, swelling and bruising to left lower leg. EMS reports pt did not have LOC.

## 2015-01-31 NOTE — ED Notes (Signed)
[facility] regarding transport to home. Spoke with Lanora Manislizabeth who states that they do not transport. We will arrange transport from here. Called POA and updated him on the patient's status.

## 2015-01-31 NOTE — ED Notes (Signed)
Pt using bed pan voided large amount

## 2015-01-31 NOTE — Discharge Instructions (Signed)
Take Keflex 3 times a day for 5 days. Change dressings once a day. The dressing should be breathable and loose. Return to the emergency Department or see her regular doctor for a wound check in 2-3 days. Return sooner if the wounds begin to look red or you have other concerns. The sutures should be removed in approximately 10 days. You may return to the emergency department for suture removal at that time as well.  Laceration Care, Adult A laceration is a cut or lesion that goes through all layers of the skin and into the tissue just beneath the skin. TREATMENT  Some lacerations may not require closure. Some lacerations may not be able to be closed due to an increased risk of infection. It is important to see your caregiver as soon as possible after an injury to minimize the risk of infection and maximize the opportunity for successful closure. If closure is appropriate, pain medicines may be given, if needed. The wound will be cleaned to help prevent infection. Your caregiver will use stitches (sutures), staples, wound glue (adhesive), or skin adhesive strips to repair the laceration. These tools bring the skin edges together to allow for faster healing and a better cosmetic outcome. However, all wounds will heal with a scar. Once the wound has healed, scarring can be minimized by covering the wound with sunscreen during the day for 1 full year. HOME CARE INSTRUCTIONS  For sutures or staples:  Keep the wound clean and dry.  If you were given a bandage (dressing), you should change it at least once a day. Also, change the dressing if it becomes wet or dirty, or as directed by your caregiver.  Wash the wound with soap and water 2 times a day. Rinse the wound off with water to remove all soap. Pat the wound dry with a clean towel.  After cleaning, apply a thin layer of the antibiotic ointment as recommended by your caregiver. This will help prevent infection and keep the dressing from sticking.  You  may shower as usual after the first 24 hours. Do not soak the wound in water until the sutures are removed.  Only take over-the-counter or prescription medicines for pain, discomfort, or fever as directed by your caregiver.  Get your sutures or staples removed as directed by your caregiver. For skin adhesive strips:  Keep the wound clean and dry.  Do not get the skin adhesive strips wet. You may bathe carefully, using caution to keep the wound dry.  If the wound gets wet, pat it dry with a clean towel.  Skin adhesive strips will fall off on their own. You may trim the strips as the wound heals. Do not remove skin adhesive strips that are still stuck to the wound. They will fall off in time. For wound adhesive:  You may briefly wet your wound in the shower or bath. Do not soak or scrub the wound. Do not swim. Avoid periods of heavy perspiration until the skin adhesive has fallen off on its own. After showering or bathing, gently pat the wound dry with a clean towel.  Do not apply liquid medicine, cream medicine, or ointment medicine to your wound while the skin adhesive is in place. This may loosen the film before your wound is healed.  If a dressing is placed over the wound, be careful not to apply tape directly over the skin adhesive. This may cause the adhesive to be pulled off before the wound is healed.  Avoid prolonged  exposure to sunlight or tanning lamps while the skin adhesive is in place. Exposure to ultraviolet light in the first year will darken the scar.  The skin adhesive will usually remain in place for 5 to 10 days, then naturally fall off the skin. Do not pick at the adhesive film. You may need a tetanus shot if:  You cannot remember when you had your last tetanus shot.  You have never had a tetanus shot. If you get a tetanus shot, your arm may swell, get red, and feel warm to the touch. This is common and not a problem. If you need a tetanus shot and you choose not to  have one, there is a rare chance of getting tetanus. Sickness from tetanus can be serious. SEEK MEDICAL CARE IF:   You have redness, swelling, or increasing pain in the wound.  You see a red line that goes away from the wound.  You have yellowish-white fluid (pus) coming from the wound.  You have a fever.  You notice a bad smell coming from the wound or dressing.  Your wound breaks open before or after sutures have been removed.  You notice something coming out of the wound such as wood or glass.  Your wound is on your hand or foot and you cannot move a finger or toe. SEEK IMMEDIATE MEDICAL CARE IF:   Your pain is not controlled with prescribed medicine.  You have severe swelling around the wound causing pain and numbness or a change in color in your arm, hand, leg, or foot.  Your wound splits open and starts bleeding.  You have worsening numbness, weakness, or loss of function of any joint around or beyond the wound.  You develop painful lumps near the wound or on the skin anywhere on your body. MAKE SURE YOU:   Understand these instructions.  Will watch your condition.  Will get help right away if you are not doing well or get worse. Document Released: 08/13/2005 Document Revised: 11/05/2011 Document Reviewed: 02/06/2011 [facility] Patient Information 2015 [place] [place]. This information is not intended to replace advice given to you by your health care provider. Make sure you discuss any questions you have with your health care provider.

## 2015-01-31 NOTE — ED Provider Notes (Signed)
[facility] Emergency Department Provider Note  ____________________________________________  Time seen: 1741  I have reviewed the triage vital signs and the nursing notes.   HISTORY  Chief Complaint Fall     HPI [name] is a 81 y.o. female who fell after her walker became stuck on the door of the laundry machine. With the fall, she hit her shins on her walker leading to lacerations of both shins. She has a skin tear of the right elbow, and she has a notable hematoma of her right forehead. She denies loss of consciousness. She is alert and communicative currently in the emergency department. She denies chest pain or palpitations or shortness of breath. The patient is quite interactive and humerus. She is in no acute distress.    Past Medical History  Diagnosis Date  . Macular degeneration   . Osteoporosis   . Hypertension   . Spinal stenosis of lumbar region   . Hyperlipidemia     There are no active problems to display for this patient.   Past Surgical History  Procedure Laterality Date  . Joint replacement Right     hip  . Abdominal hysterectomy      Current Outpatient Rx  Name  Route  Sig  Dispense  Refill  . cephALEXin (KEFLEX) 500 MG capsule   Oral   Take 1 capsule (500 mg total) by mouth 3 (three) times daily.   15 capsule   0     Allergies Celebrex  No family history on file.  Social History History  Substance Use Topics  . Smoking status: Never Smoker   . Smokeless tobacco: Not on file  . Alcohol Use: No    Review of Systems  Constitutional: Negative for fever. ENT: Negative for facial contusion or malalignment. Positive for hematoma of the right forehead. See history of present illness Cardiovascular: Negative for chest pain. Respiratory: Negative for shortness of breath. Gastrointestinal: Negative for abdominal pain, vomiting and diarrhea. Genitourinary: Negative for dysuria. Musculoskeletal: Negative for  back pain. Skin: Negative for rash. Positive for lacerations of both shins and her right elbow. See history of present illness Neurological: Negative for headaches   10-point ROS otherwise negative.  ____________________________________________   PHYSICAL EXAM:  VITAL SIGNS: ED Triage Vitals  Enc Vitals Group     BP --      Pulse --      Resp --      Temp --      Temp src --      SpO2 --      Weight --      Height --      Head Cir --      Peak Flow --      Pain Score 01/31/15 1737 0     Pain Loc --      Pain Edu? --      Excl. in GC? --     Constitutional: Alert and communicative. She is no acute distress. ENT   Head: Normocephalic and atraumatic.   Nose: No congestion/rhinnorhea.   Mouth/Throat: Mucous membranes are moist. Cardiovascular: Normal rate, regular rhythm. Respiratory: Normal respiratory effort without tachypnea. Breath sounds are clear and equal bilaterally. No wheezes/rales/rhonchi. Gastrointestinal: Soft and nontender. No distention.  Back: No muscle spasm, no tenderness, no CVA tenderness. Musculoskeletal: Nontender with normal range of motion in all extremities.  No noted edema. Neurologic:  Normal speech and language. No gross focal neurologic deficits are appreciated.  Skin:  There  are lacerations on both shins as well as a skin tear on the right elbow The laceration on the right shin has 3 branches to it. These range from 2-1/2 cm to 6 cm. The edges are somewhat irregular. The strength of the skin in the area appears fairly good especially considering her age. The cuts are deep enough to include some adipose and subcutaneous tissue. There are 2 lacerations on the left shin. The upper one is angled across the anterior shin and is approximately 5-6 cm long. The other laceration is just below this one and is approximately 2-3 cm long. The skin tear on the right elbow is fairly minimal and linear. It approximates well. It is approximately 4 cm  long. Psychiatric: Mood and affect are normal. Speech and behavior are normal.  ____________________________________________   RADIOLOGY  CT head IMPRESSION: Large scalp hematoma RIGHT frontal region. No skull fracture or intracranial hemorrhage.  ____________________________________________   PROCEDURES  LACERATION REPAIR Performed by: Darien RamusKAMINSKI,Lorrin Bodner W Authorized by: Darien RamusKAMINSKI,Cameo Schmiesing W Consent: Verbal consent obtained. Risks and benefits: risks, benefits and alternatives were discussed Consent given by: patient Patient identity confirmed: provided demographic data Prepped and Draped in normal sterile fashion Wound explored  Laceration Location: Right lower leg anterior  Laceration Length: 3 links totaling 12 cm  No Foreign Bodies seen or palpated  Anesthesia: local infiltration  Local anesthetic: lidocaine 1% Anesthetic total: 5 ml  Irrigation method: syringe Amount of cleaning: standard  Skin closure: 4-0 nylon   Number of sutures: 9   Technique: Simple interrupted sutures   Patient tolerance: Patient tolerated the procedure well with no immediate complications.  LACERATION REPAIR Performed by: Darien RamusKAMINSKI,Alle Difabio W Authorized by: Darien RamusKAMINSKI,Abdulloh Ullom W Consent: Verbal consent obtained. Risks and benefits: risks, benefits and alternatives were discussed Consent given by: patient Patient identity confirmed: provided demographic data Prepped and Draped in normal sterile fashion Wound explored  Laceration Location: Left anterior lower leg, 2 lacerations  Laceration Length: one laceration is approximately 6 cm, the lower laceration is 3 cm   No Foreign Bodies seen or palpated  Anesthesia: local infiltration  Local anesthetic: lidocaine 1%   Anesthetic total:  5 ml  Irrigation method: syringe Amount of cleaning: standard  Skin closure: 4-0 nylon   Number of sutures: 10   Technique: Simple interrupted sutures   Patient tolerance: Patient tolerated the  procedure well with no immediate complications.  Laceration repair: Right elbow Small skin tear 4 cm Closure with skin adhesive The area was examined and explored. The wound was irrigated. The wound was closed with wound adhesive.   ____________________________________________   INITIAL IMPRESSION / ASSESSMENT AND PLAN / ED COURSE  Patient with mechanical fall. No loss of consciousness. She is alert and has no neuro deficits. She does have a large hematoma on her right frontal scalp. We'll obtain a CT scan to ensure no bleeding underneath given her age [age]).  We will irrigate explore and close the respective lacerations. Emory LogantonEdwards, [place] student, has worked with me during for this patient including assisting with laceration closure.  ----------------------------------------- 8:57 PM on 01/31/2015 -----------------------------------------  Patient has been alert and pleasant throughout her stay. She is no acute distress. The wounds are closed. We will place her on Keflex for 5 days due to the fact that we are not able to do a tight closure on the complex lacerations in her lower legs.  ____________________________________________   FINAL CLINICAL IMPRESSION(S) / ED DIAGNOSES  Final diagnoses:  Laceration of right lower leg, initial encounter  Laceration of left lower leg, initial encounter  Skin tear of right elbow without complication, initial encounter  Right temporal frontal scalp contusions, initial encounter      Darien Ramus, MD 01/31/15 [phone]

## 2015-03-03 ENCOUNTER — Encounter: Payer: Medicare Other | Attending: Surgery | Admitting: Surgery

## 2015-03-03 DIAGNOSIS — M1990 Unspecified osteoarthritis, unspecified site: Secondary | ICD-10-CM | POA: Insufficient documentation

## 2015-03-03 DIAGNOSIS — M810 Age-related osteoporosis without current pathological fracture: Secondary | ICD-10-CM | POA: Insufficient documentation

## 2015-03-03 DIAGNOSIS — L97212 Non-pressure chronic ulcer of right calf with fat layer exposed: Secondary | ICD-10-CM | POA: Diagnosis present

## 2015-03-03 DIAGNOSIS — I10 Essential (primary) hypertension: Secondary | ICD-10-CM | POA: Insufficient documentation

## 2015-03-03 DIAGNOSIS — L97222 Non-pressure chronic ulcer of left calf with fat layer exposed: Secondary | ICD-10-CM | POA: Diagnosis not present

## 2015-03-03 DIAGNOSIS — K219 Gastro-esophageal reflux disease without esophagitis: Secondary | ICD-10-CM | POA: Insufficient documentation

## 2015-03-03 DIAGNOSIS — I890 Lymphedema, not elsewhere classified: Secondary | ICD-10-CM | POA: Diagnosis not present

## 2015-03-03 NOTE — Progress Notes (Addendum)
EIMAN, MARET ([id number]) Visit Report for 03/03/2015 Chief Complaint Document Details Patient Name: [name] Date of Service: 03/03/2015 9:00 AM Medical Record Number: [id number] Patient Account Number: [id number] Date of Birth/Sex: June 18, 1913 (81 y.o. Female) [name]: Primary Care Physician: PATIENT, NO Other Clinician: Referring Physician: Marisue Ivan Treating Physician/Extender: Rudene Re in Treatment: 0 Information Obtained from: Patient Chief Complaint Patient seen for complaints of Non-Healing Wound. pleasant 81 year old patient who had a fall and injured both lower extremities on 01/31/2015 Electronic Signature(s) Signed: 03/03/2015 10:51:55 AM By: Evlyn Kanner MD, FACS Entered By: Evlyn Kanner on 03/03/2015 10:51:55 [name] ([id number]) -------------------------------------------------------------------------------- Debridement Details Patient Name: [name] Date of Service: 03/03/2015 9:00 AM Medical Record Number: [id number] Patient Account Number: [id number] Date of Birth/Sex: 12/19/1912 (81 y.o. Female) [name]: Primary Care Physician: PATIENT, NO Other Clinician: Referring Physician: Marisue Ivan Treating Physician/Extender: Rudene Re in Treatment: 0 Debridement Performed for Wound #1 Left,Anterior Lower Leg Assessment: Performed By: Physician Tristan Schroeder., MD Debridement: Debridement Pre-procedure Yes Verification/Time Out Taken: Start Time: 09:50 Pain Control: Lidocaine 4% Topical Solution Level: Skin/Subcutaneous Tissue Total Area Debrided (L x 8 (cm) x 5.7 (cm) = 45.6 (cm) W): Tissue and other Viable, Non-Viable, Eschar, Exudate, Fibrin/Slough, Subcutaneous material debrided: Instrument: Curette Bleeding: Moderate Hemostasis Achieved: Pressure End Time: 09:54 Procedural Pain: 0 Post Procedural Pain: 0 Response to Treatment: Procedure was tolerated well Post Debridement  Measurements of Total Wound Length: (cm) 8 Width: (cm) 5.7 Depth: (cm) 0.3 Volume: (cm) 10.744 Electronic Signature(s) Signed: 03/03/2015 10:51:15 AM By: Evlyn Kanner MD, FACS Entered By: Evlyn Kanner on 03/03/2015 10:51:15 [name] ([id number]) -------------------------------------------------------------------------------- Debridement Details Patient Name: [name] Date of Service: 03/03/2015 9:00 AM Medical Record Number: [id number] Patient Account Number: [id number] Date of Birth/Sex: Apr 10, 1913 (81 y.o. Female) [name]: Primary Care Physician: PATIENT, NO Other Clinician: Referring Physician: Marisue Ivan Treating Physician/Extender: Rudene Re in Treatment: 0 Debridement Performed for Wound #2 Right,Anterior Lower Leg Assessment: Performed By: Physician Tristan Schroeder., MD Debridement: Debridement Pre-procedure Yes Verification/Time Out Taken: Start Time: 09:46 Pain Control: Lidocaine 4% Topical Solution Level: Skin/Subcutaneous Tissue Total Area Debrided (L x 3.2 (cm) x 2 (cm) = 6.4 (cm) W): Tissue and other Non-Viable, Eschar, Exudate, Fibrin/Slough, Subcutaneous material debrided: Instrument: Curette Bleeding: Moderate Hemostasis Achieved: Pressure End Time: 09:50 Procedural Pain: 0 Post Procedural Pain: 0 Response to Treatment: Procedure was tolerated well Post Debridement Measurements of Total Wound Length: (cm) 3.2 Width: (cm) 2 Depth: (cm) 0.3 Volume: (cm) 1.508 Electronic Signature(s) Signed: 03/03/2015 10:51:23 AM By: Evlyn Kanner MD, FACS Entered By: Evlyn Kanner on 03/03/2015 10:51:23 [name] ([id number]) -------------------------------------------------------------------------------- HPI Details Patient Name: [name] Date of Service: 03/03/2015 9:00 AM Medical Record Number: [id number] Patient Account Number: [id number] Date of Birth/Sex: 1912-10-09 (81 y.o. Female) [name]: Primary  Care Physician: PATIENT, NO Other Clinician: Referring Physician: Marisue Ivan Treating Physician/Extender: Rudene Re in Treatment: 0 History of Present Illness Location: both lower extremities Quality: Patient reports experiencing a dull pain to affected area(s). Severity: Patient states wound (s) are getting better. Duration: Patient has had the wound for < 4 weeks prior to presenting for treatment Timing: Pain in wound is Intermittent (comes and goes Context: The wound occurred when the patient had a fall and lacerated both lower extremities. Modifying Factors: Other treatment(s) tried include:she had sutures applied there for a while and when they were removed the wound opened up. Associated Signs and Symptoms: Patient reports  having difficulty standing for long periods. HPI Description: 81 year old female who fell and had an injury to her right forehead, right elbow and both shins on 01/31/2015. She went to the ER at [facility] and had been treated appropriately with sutures being placed to her right and left lower extremity. Also had a CT scan which showed no intracranial hemorrhage and only a large scalp hematoma on the right frontal region.she was put on Keflex for 5 days. she had also received Bactrim for 14 days.Since then she has been seen by her PCP and he had put her on Augmentin for 14 days and wound culture was obtained. culture reports were reviewed -- she grew an MRSA sensitive to tetracycline, Bactrim and vancomycin. Her past medical history significant for osteoporosis, hypertension, spinal stenosis, right hip replacement, appendectomy and abdomen hysterectomy for ovarian cancer. She has been seen in the past by the vascular surgery group at [facility] and she has an appointment to see them again. She is known to use lymphedema pumps while she has been in her assisted living home but has not been using them for the last month. She does also  have compression stockings which she uses intermittently. Electronic Signature(s) Signed: 03/03/2015 10:53:30 AM By: Evlyn Kanner MD, FACS Previous Signature: 03/03/2015 9:29:43 AM Version By: Evlyn Kanner MD, FACS Previous Signature: 03/03/2015 9:23:20 AM Version By: Evlyn Kanner MD, FACS Previous Signature: 03/03/2015 9:20:17 AM Version By: Evlyn Kanner MD, FACS Entered By: Evlyn Kanner on 03/03/2015 10:53:30 [name] ([id number]) -------------------------------------------------------------------------------- Physical Exam Details Patient Name: [name] Date of Service: 03/03/2015 9:00 AM Medical Record Number: [id number] Patient Account Number: [id number] Date of Birth/Sex: [birth year] (81 y.o. Female) [name]: Primary Care Physician: PATIENT, NO Other Clinician: Referring Physician: Marisue Ivan Treating Physician/Extender: Rudene Re in Treatment: 0 Eyes Nonicteric. Reactive to light. Ears, Nose, Mouth, and Throat Lips, teeth, and gums WNL.Marland Kitchen Moist mucosa without lesions . Neck supple and nontender. No palpable supraclavicular or cervical adenopathy. Normal sized without goiter. Respiratory WNL. No retractions.. Cardiovascular Pedal Pulses WNL. Doppler signals present monophasic and ABI could not be measured.. she has bilateral lymphedema +2 pitting. Gastrointestinal (GI) Abdomen without masses or tenderness.. No liver or spleen enlargement or tenderness.. Musculoskeletal Adexa without tenderness or enlargement.. Digits and nails w/o clubbing, cyanosis, infection, petechiae, ischemia, or inflammatory conditions.. Integumentary (Hair, Skin) No suspicious lesions. No crepitus or fluctuance. No peri-wound warmth or erythema. No masses.Marland Kitchen Psychiatric Judgement and insight Intact.. No evidence of depression, anxiety, or agitation.. Notes bilateral mid shin ulcerations with fat layer exposed to a lot of eschar and slough. Electronic  Signature(s) Signed: 03/03/2015 10:58:25 AM By: Evlyn Kanner MD, FACS Previous Signature: 03/03/2015 10:54:28 AM Version By: Evlyn Kanner MD, FACS Entered By: Evlyn Kanner on 03/03/2015 10:58:24 [name] ([id number]) -------------------------------------------------------------------------------- Physician Orders Details Patient Name: [name] Date of Service: 03/03/2015 9:00 AM Medical Record Patient Account Number: [id number] Number: [name], [name], [name]: [birth year] (81 y.o. [place] Date of Birth/Sex: Female) Other Clinician: Primary Care Physician: PATIENT, NO Treating Trayven Lumadue Referring Physician: Marisue Ivan Physician/Extender: Tania Ade in Treatment: 0 Verbal / Phone Orders: Yes Clinician: [name], [name], [name] Read Back and Verified: Yes Diagnosis Coding Wound Cleansing Wound #1 Left,Anterior Lower Leg o Cleanse wound with mild soap and water o May Shower, gently pat wound dry prior to applying new dressing. o May shower with protection. Wound #2 Right,Anterior Lower Leg o Cleanse wound with mild soap and water o May  Shower, gently pat wound dry prior to applying new dressing. o May shower with protection. Anesthetic Wound #1 Left,Anterior Lower Leg o Topical Lidocaine 4% cream applied to wound bed prior to debridement Wound #2 Right,Anterior Lower Leg o Topical Lidocaine 4% cream applied to wound bed prior to debridement Skin Barriers/Peri-Wound Care Wound #1 Left,Anterior Lower Leg o Skin Prep Wound #2 Right,Anterior Lower Leg o Skin Prep Primary Wound Dressing Wound #1 Left,Anterior Lower Leg o Santyl Ointment Wound #2 Right,Anterior Lower Leg o Santyl Ointment Secondary Dressing Wound #1 Left,Anterior Lower Leg Petz, Wrenly L. ([id number]) o Gauze and Kerlix/Conform Wound #2 Right,Anterior Lower Leg o Gauze and Kerlix/Conform Dressing Change Frequency Wound #1 Left,Anterior Lower Leg o  Change dressing every day. Wound #2 Right,Anterior Lower Leg o Change dressing every day. Follow-up Appointments Wound #1 Left,Anterior Lower Leg o Return Appointment in 1 week. Wound #2 Right,Anterior Lower Leg o Return Appointment in 1 week. Edema Control Wound #1 Left,Anterior Lower Leg o Patient to wear own compression stockings Wound #2 Right,Anterior Lower Leg o Patient to wear own compression stockings Medications-please add to medication list. Wound #1 Left,Anterior Lower Leg o Santyl Enzymatic Ointment Wound #2 Right,Anterior Lower Leg o Santyl Enzymatic Ointment Consults o Vascular - Patient has an appoint today at 1045am to see Dr. Lorretta Harp oooo Patient Medications Allergies: Celebrex Notifications Medication Indication Start End Santyl 03/03/2015 [name], Leah L. ([id number]) Notifications Medication Indication Start End DOSE topical 250 unit/gram ointment - ointment topical as directed Electronic Signature(s) Signed: 03/03/2015 10:57:39 AM By: Evlyn Kanner MD, FACS Entered By: Evlyn Kanner on 03/03/2015 10:57:38 Daffin, [name] ([id number]) -------------------------------------------------------------------------------- Prescription 03/03/2015 Patient Name: [name] Physician: Evlyn Kanner MD Date of Birth: 03/16/1913 NPI#: [id number] Sex: F DEA#: [id number] Phone #: [phone] License #: Patient Address: [facility] [address] [place], [place] [address] [facility] [address], [address] [place], [place] [address] [phone] Allergies Celebrex Physician's Orders Santyl Enzymatic Ointment Signature(s): Date(s): Electronic Signature(s) Signed: 03/03/2015 12:23:30 PM By: Evlyn Kanner MD, FACS Entered By: Evlyn Kanner on 03/03/2015 10:57:41 Runion, [name]  ([id number]) --------------------------------------------------------------------------------  Problem List Details Patient Name: [name] Date of Service: 03/03/2015 9:00 AM Medical Record Number: [id number] Patient Account Number: [id number] Date of Birth/Sex: 25-Jun-1913 (81 y.o. Female) [name]: Primary Care Physician: PATIENT, NO Other Clinician: Referring Physician: Marisue Ivan Treating Physician/Extender: Rudene Re in Treatment: 0 Active Problems ICD-10 Encounter Code Description Active Date Diagnosis L97.212 Non-pressure chronic ulcer of right calf with fat layer 03/03/2015 Yes exposed L97.222 Non-pressure chronic ulcer of left calf with fat layer 03/03/2015 Yes exposed I89.0 Lymphedema, not elsewhere classified 03/03/2015 Yes Inactive Problems Resolved Problems Electronic Signature(s) Signed: 03/03/2015 10:51:03 AM By: Evlyn Kanner MD, FACS Entered By: Evlyn Kanner on 03/03/2015 10:51:03 Mcmorris, [name] ([id number]) -------------------------------------------------------------------------------- Progress Note Details Patient Name: [name] Date of Service: 03/03/2015 9:00 AM Medical Record Number: [id number] Patient Account Number: [id number] Date of Birth/Sex: Apr 10, 1913 (81 y.o. Female) [name]: Primary Care Physician: PATIENT, NO Other Clinician: Referring Physician: Marisue Ivan Treating Physician/Extender: Rudene Re in Treatment: 0 Subjective Chief Complaint Information obtained from Patient Patient seen for complaints of Non-Healing Wound. pleasant 81 year old patient who had a fall and injured both lower extremities on 01/31/2015 History of Present Illness (HPI) The following HPI elements were documented for the patient's wound: Location: both lower extremities Quality: Patient reports experiencing a dull pain to affected area(s). Severity: Patient states wound (s) are  getting better. Duration: Patient has  had the wound for < 4 weeks prior to presenting for treatment Timing: Pain in wound is Intermittent (comes and goes Context: The wound occurred when the patient had a fall and lacerated both lower extremities. Modifying Factors: Other treatment(s) tried include:she had sutures applied there for a while and when they were removed the wound opened up. Associated Signs and Symptoms: Patient reports having difficulty standing for long periods. 81 year old female who fell and had an injury to her right forehead, right elbow and both shins on 01/31/2015. She went to the ER at [facility] and had been treated appropriately with sutures being placed to her right and left lower extremity. Also had a CT scan which showed no intracranial hemorrhage and only a large scalp hematoma on the right frontal region.she was put on Keflex for 5 days. she had also received Bactrim for 14 days.Since then she has been seen by her PCP and he had put her on Augmentin for 14 days and wound culture was obtained. culture reports were reviewed -- she grew an MRSA sensitive to tetracycline, Bactrim and vancomycin. Her past medical history significant for osteoporosis, hypertension, spinal stenosis, right hip replacement, appendectomy and abdomen hysterectomy for ovarian cancer. She has been seen in the past by the vascular surgery group at [facility] and she has an appointment to see them again. She is known to use lymphedema pumps while she has been in her assisted living home but has not been using them for the last month. She does also have compression stockings which she uses intermittently. Wound History Patient presents with 2 open wounds that have been present for approximately 59month. Patient has been treating wounds in the following manner: dry dressing. Laboratory tests have not been performed in the last month. Patient reportedly has not tested positive for an antibiotic resistant organism. Patient  reportedly has had testing performed to evaluate circulation in the legs. Patient experiences the following problems associated with their wounds: infection, swelling. [name] ([id number]) Patient History Information obtained from Patient, Caregiver. Allergies Celebrex Family History Hypertension - Father, No family history of Cancer, Diabetes, Heart Disease, Hereditary Spherocytosis, Kidney Disease, Lung Disease, Seizures, Stroke, Thyroid Problems, Tuberculosis. Social History Never smoker, Marital Status - Single, Alcohol Use - Never, Drug Use - No History, Caffeine Use - Never. Medical History Eyes Denies history of Cataracts, Glaucoma, Optic Neuritis Hematologic/Lymphatic Denies history of Anemia, Hemophilia, Human Immunodeficiency Virus, Lymphedema, Sickle Cell Disease Respiratory Denies history of Aspiration, Asthma, Chronic Obstructive Pulmonary Disease (COPD), Pneumothorax, Sleep Apnea, Tuberculosis Cardiovascular Patient has history of Hypertension Endocrine Denies history of Type I Diabetes, Type II Diabetes Immunological Denies history of Lupus Erythematosus, Raynaud s, Scleroderma Integumentary (Skin) Denies history of History of Burn, History of pressure wounds Musculoskeletal Patient has history of Osteoarthritis Denies history of Gout, Rheumatoid Arthritis, Osteomyelitis Neurologic Denies history of Dementia, Neuropathy, Quadriplegia, Paraplegia, Seizure Disorder Oncologic Denies history of Received Chemotherapy, Received Radiation Psychiatric Denies history of Anorexia/bulimia, Confinement Anxiety Medical And Surgical History Notes Gastrointestinal GERD Genitourinary bladder dysfunction Review of Systems (ROS) Constitutional Symptoms (General Health) The patient has no complaints or symptoms. Barbary, Cynethia L. ([id number]) Eyes Complains or has symptoms of Glasses / Contacts. Ear/Nose/Mouth/Throat The patient has no complaints or symptoms, Hard  of hearing Hematologic/Lymphatic The patient has no complaints or symptoms. Respiratory The patient has no complaints or symptoms. Cardiovascular The patient has no complaints or symptoms. Gastrointestinal The patient has no complaints or symptoms. Endocrine The patient  has no complaints or symptoms. Genitourinary The patient has no complaints or symptoms. Immunological The patient has no complaints or symptoms. Integumentary (Skin) Complains or has symptoms of Wounds, Swelling. Musculoskeletal The patient has no complaints or symptoms. Neurologic The patient has no complaints or symptoms. Oncologic The patient has no complaints or symptoms. Psychiatric The patient has no complaints or symptoms. Medications Mapap (acetaminophen) 325 mg tablet oral 2 2 tablet oral hydrocodone 5 mg-acetaminophen 325 mg tablet oral tablet oral moexipril 15 mg tablet oral tablet oral PreserVision AREDS 2 250 mg-200 unit-40 mg-1 mg capsule oral capsule oral benzonatate 100 mg capsule oral capsule oral Artificial Tears eye drops ophthalmic drops ophthalmic Systane 0.4 %-0.3 % eye drops ophthalmic drops ophthalmic amlodipine 5 mg tablet oral tablet oral Ensure oral liquid oral liquid oral Miralax 17 gram oral powder packet oral powder in packet oral latanoprost 0.005 % eye drops ophthalmic drops ophthalmic Hydromet 5 mg-1.5 mg/5 mL syrup oral syrup oral naproxen 250 mg tablet oral tablet oral amoxicillin 875 mg-potassium clavulanate 125 mg tablet oral 1 1 tablet oral every 12 hours to stop in 14 days omeprazole 20 mg capsule,delayed release oral capsule,delayed release(DR/EC) oral Anusol-HC 2.5 % rectal cream rectal cream rectal [name], Leah L. ([id number]) chlorthalidone 25 mg tablet oral tablet oral triamcinolone acetonide 0.1 % topical cream topical cream topical lidocaine 5 % adhesive patch topical adhesive patch,medicated topical oxybutynin chloride ER 10 mg tablet,extended release 24 hr  oral tablet extended release 24hr oral Vitamin D3 2,000 unit capsule oral capsule oral Objective Constitutional Vitals Time Taken: 9:18 AM, Height: 64 in, Source: Stated, Weight: 147 lbs, Source: Stated, BMI: 25.2, Temperature: 97.8 F, Pulse: 66 bpm, Respiratory Rate: 16 breaths/min, Blood Pressure: 143/60 mmHg. Eyes Nonicteric. Reactive to light. Ears, Nose, Mouth, and Throat Lips, teeth, and gums WNL.Marland Kitchen Moist mucosa without lesions . Neck supple and nontender. No palpable supraclavicular or cervical adenopathy. Normal sized without goiter. Respiratory WNL. No retractions.. Cardiovascular Pedal Pulses WNL. Doppler signals present monophasic and ABI could not be measured.. she has bilateral lymphedema +2 pitting. Gastrointestinal (GI) Abdomen without masses or tenderness.. No liver or spleen enlargement or tenderness.. Musculoskeletal Adexa without tenderness or enlargement.. Digits and nails w/o clubbing, cyanosis, infection, petechiae, ischemia, or inflammatory conditions.Marland Kitchen Psychiatric Judgement and insight Intact.. No evidence of depression, anxiety, or agitation.. General Notes: bilateral mid shin ulcerations with fat layer exposed to a lot of eschar and slough. Integumentary (Hair, Skin) No suspicious lesions. No crepitus or fluctuance. No peri-wound warmth or erythema. No masses.. Wound #1 status is Open. Original cause of wound was Trauma. The wound is located on the Left,Anterior Freeland, Leah L. ([id number]) Lower Leg. The wound measures 8cm length x 5.7cm width x 0.3cm depth; 35.814cm^2 area and 10.744cm^3 volume. The wound is limited to skin breakdown. There is no tunneling or undermining noted. There is a medium amount of serosanguineous drainage noted. The wound margin is distinct with the outline attached to the wound base. There is no granulation within the wound bed. There is a large (67- 100%) amount of necrotic tissue within the wound bed including Eschar and  Adherent Slough. The periwound skin appearance exhibited: Localized Edema, Moist. The periwound skin appearance did not exhibit: Callus, Crepitus, Excoriation, Fluctuance, Friable, Induration, Rash, Scarring, Dry/Scaly, Maceration, Atrophie Blanche, Cyanosis, Ecchymosis, Hemosiderin Staining, Mottled, Pallor, Rubor, Erythema. Periwound temperature was noted as No Abnormality. Wound #2 status is Open. Original cause of wound was Trauma. The wound is located on the Right,Anterior Lower Leg. The wound measures  3.2cm length x 2cm width x 0.3cm depth; 5.027cm^2 area and 1.508cm^3 volume. The wound is limited to skin breakdown. There is no tunneling or undermining noted. There is a medium amount of serosanguineous drainage noted. The wound margin is distinct with the outline attached to the wound base. There is no granulation within the wound bed. There is a large (67- 100%) amount of necrotic tissue within the wound bed including Adherent Slough. The periwound skin appearance exhibited: Localized Edema. The periwound skin appearance did not exhibit: Callus, Crepitus, Excoriation, Fluctuance, Friable, Induration, Rash, Scarring, Dry/Scaly, Maceration, Moist, Atrophie Blanche, Cyanosis, Ecchymosis, Hemosiderin Staining, Mottled, Pallor, Rubor, Erythema. Periwound temperature was noted as No Abnormality. The periwound has tenderness on palpation. Assessment Active Problems ICD-10 L97.212 - Non-pressure chronic ulcer of right calf with fat layer exposed L97.222 - Non-pressure chronic ulcer of left calf with fat layer exposed I89.0 - Lymphedema, not elsewhere classified I have recommended Santyl ointment locally to be applied to the area daily and a bordered foam dressing. Have also recommended she wears her compression stockings and have discussed elevation in great detail with a caregiver. She will be seen back on regular basis on weekly intervals and all management has been discussed with her. She  is also urged to keep her vascular appointment. We will try and obtain the notes from the vascular office. Procedures Wound #1 Wound #1 is a Skin Tear located on the Left,Anterior Lower Leg . There was a Skin/Subcutaneous Tissue Debridement ([id number]) debridement with total area of 45.6 sq cm performed by Tristan Schroeder., MD. [name] ([id number]) with the following instrument(s): Curette to remove Viable and Non-Viable tissue/material including Exudate, Fibrin/Slough, Eschar, and Subcutaneous after achieving pain control using Lidocaine 4% Topical Solution. A time out was conducted prior to the start of the procedure. A Moderate amount of bleeding was controlled with Pressure. The procedure was tolerated well with a pain level of 0 throughout and a pain level of 0 following the procedure. Post Debridement Measurements: 8cm length x 5.7cm width x 0.3cm depth; 10.744cm^3 volume. Wound #2 Wound #2 is a Skin Tear located on the Right,Anterior Lower Leg . There was a Skin/Subcutaneous Tissue Debridement ([id number]) debridement with total area of 6.4 sq cm performed by Tristan Schroeder., MD. with the following instrument(s): Curette to remove Non-Viable tissue/material including Exudate, Fibrin/Slough, Eschar, and Subcutaneous after achieving pain control using Lidocaine 4% Topical Solution. A time out was conducted prior to the start of the procedure. A Moderate amount of bleeding was controlled with Pressure. The procedure was tolerated well with a pain level of 0 throughout and a pain level of 0 following the procedure. Post Debridement Measurements: 3.2cm length x 2cm width x 0.3cm depth; 1.508cm^3 volume. Plan Wound Cleansing: Wound #1 Left,Anterior Lower Leg: Cleanse wound with mild soap and water May Shower, gently pat wound dry prior to applying new dressing. May shower with protection. Wound #2 Right,Anterior Lower Leg: Cleanse wound with mild soap and water May Shower,  gently pat wound dry prior to applying new dressing. May shower with protection. Anesthetic: Wound #1 Left,Anterior Lower Leg: Topical Lidocaine 4% cream applied to wound bed prior to debridement Wound #2 Right,Anterior Lower Leg: Topical Lidocaine 4% cream applied to wound bed prior to debridement Skin Barriers/Peri-Wound Care: Wound #1 Left,Anterior Lower Leg: Skin Prep Wound #2 Right,Anterior Lower Leg: Skin Prep Primary Wound Dressing: Wound #1 Left,Anterior Lower Leg: Santyl Ointment Wound #2 Right,Anterior Lower Leg: Santyl Ointment Secondary Dressing: Wound #1 Left,Anterior Lower  Leg: Gauze and Kerlix/Conform Wound #2 Right,Anterior Lower Leg: Nemes, Hasini L. ([id number]) Gauze and Kerlix/Conform Dressing Change Frequency: Wound #1 Left,Anterior Lower Leg: Change dressing every day. Wound #2 Right,Anterior Lower Leg: Change dressing every day. Follow-up Appointments: Wound #1 Left,Anterior Lower Leg: Return Appointment in 1 week. Wound #2 Right,Anterior Lower Leg: Return Appointment in 1 week. Edema Control: Wound #1 Left,Anterior Lower Leg: Patient to wear own compression stockings Wound #2 Right,Anterior Lower Leg: Patient to wear own compression stockings Medications-please add to medication list.: Wound #1 Left,Anterior Lower Leg: Santyl Enzymatic Ointment Wound #2 Right,Anterior Lower Leg: Santyl Enzymatic Ointment Consults ordered were: Vascular - Patient has an appoint today at 1045am to see Dr. Lorretta Harp The following medication(s) was prescribed: Santyl topical 250 unit/gram ointment ointment topical as directed starting 03/03/2015 I have recommended Santyl ointment locally to be applied to the area daily and a bordered foam dressing. Have also recommended she wears her compression stockings and have discussed elevation in great detail with a caregiver. She will be seen back on regular basis on weekly intervals and all management has been discussed with  her. She is also urged to keep her vascular appointment. We will try and obtain the notes from the vascular office. Electronic Signature(s) Signed: 03/03/2015 10:58:39 AM By: Evlyn Kanner MD, FACS Previous Signature: 03/03/2015 10:55:47 AM Version By: Evlyn Kanner MD, FACS Previous Signature: 03/03/2015 10:55:29 AM Version By: Evlyn Kanner MD, FACS Entered By: Evlyn Kanner on 03/03/2015 10:58:39 [name] ([id number]) -------------------------------------------------------------------------------- ROS/PFSH Details Patient Name: [name] Date of Service: 03/03/2015 9:00 AM Medical Record Number: [id number] Patient Account Number: [id number] Date of Birth/Sex: 1913/03/16 (81 y.o. Female) [name]: Primary Care Physician: PATIENT, NO Other Clinician: Referring Physician: Marisue Ivan Treating Physician/Extender: Rudene Re in Treatment: 0 Information Obtained From Patient Caregiver Wound History Do you currently have one or more open woundso Yes How many open wounds do you currently haveo 2 Approximately how long have you had your woundso 72month How have you been treating your wound(s) until nowo dry dressing Has your wound(s) ever healed and then re-openedo No Have you had any lab work done in the past montho No Have you tested positive for an antibiotic resistant organism (MRSA, VRE)o No Have you had any tests for circulation on your legso Yes Who ordered the testo Dr Edrick Kins Have you had other problems associated with your woundso Infection, Swelling Constitutional Symptoms (General Health) Complaints and Symptoms: No Complaints or Symptoms Complaints and Symptoms: Negative for: Fatigue; Fever; Chills; Marked Weight Change Eyes Complaints and Symptoms: Positive for: Glasses / Contacts Medical History: Negative for: Cataracts; Glaucoma; Optic Neuritis Integumentary (Skin) Complaints and Symptoms: Positive for: Wounds; Swelling Medical  History: Negative for: History of Burn; History of pressure wounds Ear/Nose/Mouth/Throat Complaints and Symptoms: No Complaints or Symptoms Grills, Saudia L. ([id number]) Complaints and Symptoms: Review of System Notes: Hard of hearing Hematologic/Lymphatic Complaints and Symptoms: No Complaints or Symptoms Medical History: Negative for: Anemia; Hemophilia; Human Immunodeficiency Virus; Lymphedema; Sickle Cell Disease Respiratory Complaints and Symptoms: No Complaints or Symptoms Medical History: Negative for: Aspiration; Asthma; Chronic Obstructive Pulmonary Disease (COPD); Pneumothorax; Sleep Apnea; Tuberculosis Cardiovascular Complaints and Symptoms: No Complaints or Symptoms Medical History: Positive for: Hypertension Gastrointestinal Complaints and Symptoms: No Complaints or Symptoms Medical History: Past Medical History Notes: GERD Endocrine Complaints and Symptoms: No Complaints or Symptoms Medical History: Negative for: Type I Diabetes; Type II Diabetes Genitourinary Complaints and Symptoms: No Complaints or Symptoms Karger, Breyonna L. ([id number]) Medical History: Past  Medical History Notes: bladder dysfunction Immunological Complaints and Symptoms: No Complaints or Symptoms Medical History: Negative for: Lupus Erythematosus; Raynaudos; Scleroderma Musculoskeletal Complaints and Symptoms: No Complaints or Symptoms Medical History: Positive for: Osteoarthritis Negative for: Gout; Rheumatoid Arthritis; Osteomyelitis Neurologic Complaints and Symptoms: No Complaints or Symptoms Medical History: Negative for: Dementia; Neuropathy; Quadriplegia; Paraplegia; Seizure Disorder Oncologic Complaints and Symptoms: No Complaints or Symptoms Medical History: Negative for: Received Chemotherapy; Received Radiation Psychiatric Complaints and Symptoms: No Complaints or Symptoms Medical History: Negative for: Anorexia/bulimia; Confinement Anxiety Family and  Social History Cancer: No; Diabetes: No; Heart Disease: No; Hereditary Spherocytosis: No; Hypertension: Yes - Father; Kidney Disease: No; Lung Disease: No; Seizures: No; Stroke: No; Thyroid Problems: No; Tuberculosis: No; Never smoker; Marital Status - Single; Alcohol Use: Never; Drug Use: No History; Caffeine Use: Never; Financial Concerns: No; Food, Clothing or Shelter Needs: No; Support System Lacking: No; Transportation Concerns: No; Advanced Directives: Yes; Living Will: Yes Dehoyos, Huda L. ([id number]) Physician Affirmation I have reviewed and agree with the above information. Electronic Signature(s) Signed: 03/03/2015 9:48:22 AM By: Elpidio EricAfful, [name] BSN, [name] Signed: 03/03/2015 12:23:30 PM By: Evlyn KannerBritto, Jemila Camille MD, FACS Previous Signature: 03/03/2015 9:43:12 AM Version By: Evlyn KannerBritto, Dev Dhondt MD, FACS Previous Signature: 03/03/2015 9:33:40 AM Version By: Evlyn KannerBritto, Babita Amaker MD, FACS Entered By: Elpidio EricAfful, [name] on 03/03/2015 09:48:22 Venson, Leah HuhNANCY L. ([id number]) -------------------------------------------------------------------------------- SuperBill Details Patient Name: Leah GooPENDER, Conna L. Date of Service: 03/03/2015 Medical Record Number: [id number] Patient Account Number: [id number] Date of Birth/Sex: 05-25-1913 (81 y.o. 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 I89.0 Lymphedema, not elsewhere classified Facility Procedures CPT4 Code: [id number] Description: 99213 - WOUND CARE VISIT-LEV 3 EST PT Modifier: Quantity: 1 CPT4 Code: 3244010236100012 Description: 11042 - DEB SUBQ TISSUE 20 SQ CM/< ICD-10 Description Diagnosis L97.212 Non-pressure chronic ulcer of right calf with fat L97.222  Non-pressure chronic ulcer of left calf with fat l I89.0 Lymphedema, not elsewhere classified Modifier: layer exposed ayer exposed Quantity: 1 CPT4 Code: [id number] Description: 11045 - DEB SUBQ TISS EA ADDL 20CM ICD-10 Description Diagnosis L97.212 Non-pressure chronic ulcer of right calf with fat L97.222 Non-pressure chronic ulcer of left calf with fat l I89.0 Lymphedema, not elsewhere classified Modifier: layer exposed ayer exposed Quantity: 2 Physician Procedures CPT4 Code: [id number] Description: 99204 - WC PHYS LEVEL 4 - NEW PT ICD-10 Description Diagnosis L97.212 Non-pressure chronic ulcer of right calf with fat l L97.222 Non-pressure chronic ulcer of left calf with fat la I89.0 Lymphedema, not elsewhere classified Modifier: ayer exposed yer exposed Quantity: 1 CPT4 Code: [id number] Dillenburg, Hayla Description: 11042 - WC PHYS SUBQ TISS 20 SQ CM L. ([id number]) Modifier: Quantity: 1 Electronic Signature(s) Signed: 03/03/2015 10:56:20 AM By: Evlyn KannerBritto, Rynlee Lisbon MD, FACS Entered By: Evlyn KannerBritto, Landry Lookingbill on 03/03/2015 10:56:20

## 2015-03-04 NOTE — Progress Notes (Signed)
ADALIN, VANDERPLOEG ([id number]) Visit Report for 03/03/2015 Allergy List Details Patient Name: [name], [name] Date of Service: 03/03/2015 9:00 AM Medical Record Number: [id number] Patient Account Number: [id number] Date of Birth/Sex: September 19, 1912 (81 y.o. Female) Treating RN: Clover Mealy, RN, BSN, [employer] Primary Care Physician: PATIENT, NO Other Clinician: Referring Physician: Marisue Ivan Treating Physician/Extender: Rudene Re in Treatment: 0 Allergies Active Allergies Celebrex Allergy Notes Electronic Signature(s) Signed: 03/03/2015 4:18:25 PM By: Elpidio Eric BSN, RN Entered By: Elpidio Eric on 03/03/2015 09:33:18 [name], [name] ([id number]) -------------------------------------------------------------------------------- Arrival Information Details Patient Name: [name] Date of Service: 03/03/2015 9:00 AM Medical Record Number: [id number] Patient Account Number: [id number] Date of Birth/Sex: 02-Jun-1913 (81 y.o. Female) Treating RN: [name], RN, BSN, [employer] Primary Care Physician: PATIENT, NO Other Clinician: Referring Physician: Marisue Ivan Treating Physician/Extender: Rudene Re in Treatment: 0 Visit Information Patient Arrived: Wheel Chair Arrival Time: 09:13 Accompanied By: self Transfer Assistance: None Patient Identification Verified: Yes Secondary Verification Process Yes Completed: Patient Requires Transmission- No Based Precautions: Patient Has Alerts: Yes Patient Alerts: ABI not done due to pain. Electronic Signature(s) Signed: 03/03/2015 4:18:25 PM By: Elpidio Eric BSN, RN Entered By: Elpidio Eric on 03/03/2015 09:55:42 [name], [name] ([id number]) -------------------------------------------------------------------------------- Clinic Level of Care Assessment Details Patient Name: [name] Date of Service: 03/03/2015 9:00 AM Medical Record Number: [id number] Patient Account Number: [id number] Date of Birth/Sex: 02-18-1913 (81 y.o.  Female) Treating RN: [name], RN, BSN, [place] [employer] Primary Care Physician: PATIENT, NO Other Clinician: Referring Physician: Marisue Ivan Treating Physician/Extender: Rudene Re in Treatment: 0 Clinic Level of Care Assessment Items TOOL 1 Quantity Score []  - Use when EandM and Procedure is performed on INITIAL visit 0 ASSESSMENTS - Nursing Assessment / Reassessment X - General Physical Exam (combine w/ comprehensive assessment (listed just 1 20 below) when performed on new pt. evals) X - Comprehensive Assessment (HX, ROS, Risk Assessments, Wounds Hx, etc.) 1 25 ASSESSMENTS - Wound and Skin Assessment / Reassessment []  - Dermatologic / Skin Assessment (not related to wound area) 0 ASSESSMENTS - Ostomy and/or Continence Assessment and Care []  - Incontinence Assessment and Management 0 []  - Ostomy Care Assessment and Management (repouching, etc.) 0 PROCESS - Coordination of Care X - Simple Patient / Family Education for ongoing care 1 15 []  - Complex (extensive) Patient / Family Education for ongoing care 0 X - Staff obtains Chiropractor, Records, Test Results / Process Orders 1 10 []  - Staff telephones HHA, Nursing Homes / Clarify orders / etc 0 []  - Routine Transfer to another Facility (non-emergent condition) 0 []  - Routine Hospital Admission (non-emergent condition) 0 X - New Admissions / Manufacturing engineer / Ordering NPWT, Apligraf, etc. 1 15 []  - Emergency Hospital Admission (emergent condition) 0 PROCESS - Special Needs []  - Pediatric / Minor Patient Management 0 []  - Isolation Patient Management 0 Tener, Tabetha L. ([id number]) []  - Hearing / Language / Visual special needs 0 []  - Assessment of Community assistance (transportation, D/C planning, etc.) 0 []  - Additional assistance / Altered mentation 0 []  - Support Surface(s) Assessment (bed, cushion, seat, etc.) 0 INTERVENTIONS - Miscellaneous []  - External ear exam 0 []  - Patient Transfer (multiple staff / Water quality scientist / Similar devices) 0 []  - Simple Staple / Suture removal (25 or less) 0 []  - Complex Staple / Suture removal (26 or more) 0 []  - Hypo/Hyperglycemic Management (do not check if billed separately) 0 []  - Ankle / Brachial Index (ABI) - do not check if billed  separately 0 Has the patient been seen at the hospital within the last three years: Yes Total Score: 85 Level Of Care: New/Established - Level 3 Electronic Signature(s) Signed: 03/03/2015 4:18:25 PM By: Elpidio Eric BSN, RN Entered By: Elpidio Eric on 03/03/2015 10:23:42 [name], [name] ([id number]) -------------------------------------------------------------------------------- Encounter Discharge Information Details Patient Name: [name] Date of Service: 03/03/2015 9:00 AM Medical Record Number: [id number] Patient Account Number: [id number] Date of Birth/Sex: Feb 01, 1913 (81 y.o. Female) Treating RN: Clover Mealy, RN, BSN, [employer] Primary Care Physician: PATIENT, NO Other Clinician: Referring Physician: Marisue Ivan Treating Physician/Extender: Rudene Re in Treatment: 0 Encounter Discharge Information Items Discharge Pain Level: 0 Discharge Condition: Stable Ambulatory Status: Wheelchair Nursing Discharge Destination: Home Transportation: Private Auto Accompanied By: caregiver Schedule Follow-up Appointment: No Medication Reconciliation completed No and provided to Patient/Care Inita Uram: Clinical Summary of Care: Electronic Signature(s) Signed: 03/03/2015 4:18:25 PM By: Elpidio Eric BSN, RN Entered By: Elpidio Eric on 03/03/2015 10:35:28 [name], [name] ([id number]) -------------------------------------------------------------------------------- Lower Extremity Assessment Details Patient Name: [name] Date of Service: 03/03/2015 9:00 AM Medical Record Number: [id number] Patient Account Number: [id number] Date of Birth/Sex: 1913/05/12 (81 y.o. Female) Treating RN: [name], RN, BSN, [employer] Primary Care  Physician: PATIENT, NO Other Clinician: Referring Physician: Marisue Ivan Treating Physician/Extender: Rudene Re in Treatment: 0 Edema Assessment Assessed: [Left: No] [Right: No] E[Left: dema] [Right: :] Calf Left: Right: Point of Measurement: 35 cm From Medial Instep 38.2 cm 35.6 cm Ankle Left: Right: Point of Measurement: 8 cm From Medial Instep 26.6 cm 27.2 cm Vascular Assessment Pulses: Posterior Tibial Palpable: [Left:No] [Right:No] Dorsalis Pedis Palpable: [Left:No] [Right:No] Doppler: [Left:Monophasic] [Right:Monophasic] Extremity colors, hair growth, and conditions: Extremity Color: [Left:Mottled] [Right:Mottled] Hair Growth on Extremity: [Left:No] [Right:No] Temperature of Extremity: [Left:Warm] [Right:Warm] Capillary Refill: [Left:< 3 seconds] [Right:< 3 seconds] Dependent Rubor: [Left:No] [Right:No] Blanched when Elevated: [Left:No] [Right:No] Lipodermatosclerosis: [Left:No] [Right:No] Toe Nail Assessment Left: Right: Thick: Yes Yes Discolored: Yes Yes Deformed: No No Improper Length and Hygiene: No No Viele, Jachelle LMarland Kitchen ([id number]) Electronic Signature(s) Signed: 03/03/2015 4:18:25 PM By: Elpidio Eric BSN, RN Entered By: Elpidio Eric on 03/03/2015 09:22:59 [name], [name] ([id number]) -------------------------------------------------------------------------------- Multi Wound Chart Details Patient Name: [name] Date of Service: 03/03/2015 9:00 AM Medical Record Number: [id number] Patient Account Number: [id number] Date of Birth/Sex: 1913/07/27 (81 y.o. Female) Treating RN: Clover Mealy, RN, BSN, [employer] Primary Care Physician: PATIENT, NO Other Clinician: Referring Physician: Marisue Ivan Treating Physician/Extender: Rudene Re in Treatment: 0 Vital Signs Height(in): 64 Pulse(bpm): 66 Weight(lbs): 147 Blood Pressure 143/60 (mmHg): Body Mass Index(BMI): 25 Temperature(F): 97.8 Respiratory Rate 16 (breaths/min): Photos:  [1:No Photos] [2:No Photos] [N/A:N/A] Wound Location: [1:Left Lower Leg - Anterior] [2:Right Lower Leg - Anterior N/A] Wounding Event: [1:Trauma] [2:Trauma] [N/A:N/A] Primary Etiology: [1:Skin Tear] [2:Skin Tear] [N/A:N/A] Date Acquired: [1:01/31/2015] [2:01/31/2015] [N/A:N/A] Weeks of Treatment: [1:0] [2:0] [N/A:N/A] Wound Status: [1:Open] [2:Open] [N/A:N/A] Measurements L x W x D 8x5.7x0.3 [2:3.2x2x0.3] [N/A:N/A] (cm) Area (cm) : [1:35.814] [2:5.027] [N/A:N/A] Volume (cm) : [1:10.744] [2:1.508] [N/A:N/A] % Reduction in Area: [1:0.00%] [2:0.00%] [N/A:N/A] % Reduction in Volume: 0.00% [2:0.00%] [N/A:N/A] Classification: [1:Full Thickness Without Exposed Support Structures] [2:Full Thickness Without Exposed Support Structures] [N/A:N/A] Exudate Amount: [1:Medium] [2:Medium] [N/A:N/A] Exudate Type: [1:Serosanguineous] [2:Serosanguineous] [N/A:N/A] Exudate Color: [1:red, brown] [2:red, brown] [N/A:N/A] Wound Margin: [1:Distinct, outline attached] [2:Distinct, outline attached] [N/A:N/A] Granulation Amount: [1:None Present (0%)] [2:None Present (0%)] [N/A:N/A] Necrotic Amount: [1:Large (67-100%)] [2:Large (67-100%)] [N/A:N/A] Necrotic Tissue: [1:Eschar, Adherent Slough] [2:Adherent Slough] [N/A:N/A] Exposed Structures: [1:Fascia: No Fat: No  Tendon: No Muscle: No Joint: No Bone: No] [2:Fascia: No Fat: No Tendon: No Muscle: No Joint: No Bone: No] [N/A:N/A] Limited to Skin Limited to Skin Breakdown Breakdown Epithelialization: None None N/A Debridement: Debridement (16109- Debridement (60454- N/A 11047) 11047) Time-Out Taken: Yes Yes N/A Pain Control: Lidocaine 4% Topical Lidocaine 4% Topical N/A Solution Solution Tissue Debrided: Necrotic/Eschar, Necrotic/Eschar, N/A Fibrin/Slough, Exudates, Fibrin/Slough, Exudates, Subcutaneous Subcutaneous Level: Skin/Subcutaneous Skin/Subcutaneous N/A Tissue Tissue Debridement Area (sq 45.6 6.4 N/A cm): Instrument: Curette Curette N/A Bleeding:  Moderate Moderate N/A Hemostasis Achieved: Pressure Pressure N/A Procedural Pain: 0 0 N/A Post Procedural Pain: 0 0 N/A Debridement Treatment Procedure was tolerated Procedure was tolerated N/A Response: well well Post Debridement 8x5.7x0.3 3.2x2x0.3 N/A Measurements L x W x D (cm) Post Debridement 10.744 1.508 N/A Volume: (cm) Periwound Skin Texture: Edema: Yes Edema: Yes N/A Excoriation: No Excoriation: No Induration: No Induration: No Callus: No Callus: No Crepitus: No Crepitus: No Fluctuance: No Fluctuance: No Friable: No Friable: No Rash: No Rash: No Scarring: No Scarring: No Periwound Skin Moist: Yes Maceration: No N/A Moisture: Maceration: No Moist: No [name]/Scaly: No [name]/Scaly: No Periwound Skin Color: Atrophie Blanche: No Atrophie Blanche: No N/A Cyanosis: No Cyanosis: No Ecchymosis: No Ecchymosis: No Erythema: No Erythema: No Hemosiderin Staining: No Hemosiderin Staining: No Mottled: No Mottled: No Pallor: No Pallor: No Rubor: No Rubor: No Temperature: No Abnormality No Abnormality N/A Tenderness on No Yes N/A Palpation: [name], Beola L. ([id number]) Wound Preparation: Ulcer Cleansing: Ulcer Cleansing: N/A Rinsed/Irrigated with Rinsed/Irrigated with Saline Saline Topical Anesthetic Topical Anesthetic Applied: Other: Lidocaine Applied: Other: lidocaine 4% 4% ceam Procedures Performed: Debridement Debridement N/A Treatment Notes Electronic Signature(s) Signed: 03/03/2015 4:18:25 PM By: Elpidio Eric BSN, RN Entered By: Elpidio Eric on 03/03/2015 10:22:49 [name], [name] ([id number]) -------------------------------------------------------------------------------- Multi-Disciplinary Care Plan Details Patient Name: [name] Date of Service: 03/03/2015 9:00 AM Medical Record Number: [id number] Patient Account Number: [id number] Date of Birth/Sex: 06-13-1913 (81 y.o. Female) Treating RN: [name], RN, BSN, [employer] Primary Care Physician: PATIENT,  NO Other Clinician: Referring Physician: Marisue Ivan Treating Physician/Extender: Rudene Re in Treatment: 0 Active Inactive Abuse / Safety / Falls / Self Care Management Nursing Diagnoses: Impaired home maintenance Impaired physical mobility Potential for falls Self care deficit: actual or potential Goals: Patient will remain injury free Date Initiated: 03/03/2015 Goal Status: Active Patient/caregiver will verbalize understanding of skin care regimen Date Initiated: 03/03/2015 Goal Status: Active Patient/caregiver will verbalize/demonstrate measure taken to improve self care Date Initiated: 03/03/2015 Goal Status: Active Patient/caregiver will verbalize/demonstrate measures taken to improve the patient's personal safety Date Initiated: 03/03/2015 Goal Status: Active Patient/caregiver will verbalize/demonstrate measures taken to prevent injury and/or falls Date Initiated: 03/03/2015 Goal Status: Active Patient/caregiver will verbalize/demonstrate understanding of what to do in case of emergency Date Initiated: 03/03/2015 Goal Status: Active Interventions: Assess: immobility, friction, shearing, incontinence upon admission and as needed Assess impairment of mobility on admission and as needed per policy Provide education on basic hygiene Provide education on personal and home safety Provide education on safe transfers ALLYNE, HEBERT ([id number]) Notes: Orientation to the Wound Care Program Nursing Diagnoses: Knowledge deficit related to the wound healing center program Goals: Patient/caregiver will verbalize understanding of the Wound Healing Center Program Date Initiated: 03/03/2015 Goal Status: Active Interventions: Provide education on orientation to the wound center Notes: Venous Leg Ulcer Nursing Diagnoses: Knowledge deficit related to disease process and management Potential for venous Insuffiency (use before diagnosis confirmed) Goals: Patient will  maintain optimal edema control Date Initiated:  03/03/2015 Goal Status: Active Patient/caregiver will verbalize understanding of disease process and disease management Date Initiated: 03/03/2015 Goal Status: Active Verify adequate tissue perfusion prior to therapeutic compression application Date Initiated: 03/03/2015 Goal Status: Active Interventions: Assess peripheral edema status every visit. Compression as ordered Provide education on venous insufficiency Treatment Activities: Test ordered outside of clinic : 03/03/2015 Notes: Wound/Skin Impairment Whisnant, Tamirra L. ([id number]) Nursing Diagnoses: Impaired tissue integrity Knowledge deficit related to ulceration/compromised skin integrity Goals: Patient/caregiver will verbalize understanding of skin care regimen Date Initiated: 03/03/2015 Goal Status: Active Ulcer/skin breakdown will have a volume reduction of 30% by week 4 Date Initiated: 03/03/2015 Goal Status: Active Ulcer/skin breakdown will have a volume reduction of 50% by week 8 Date Initiated: 03/03/2015 Goal Status: Active Ulcer/skin breakdown will have a volume reduction of 80% by week 12 Date Initiated: 03/03/2015 Goal Status: Active Ulcer/skin breakdown will heal within 14 weeks Date Initiated: 03/03/2015 Goal Status: Active Interventions: Assess patient/caregiver ability to perform ulcer/skin care regimen upon admission and as needed Assess ulceration(s) every visit Provide education on ulcer and skin care Notes: Electronic Signature(s) Signed: 03/03/2015 4:18:25 PM By: Elpidio EricAfful, Rita BSN, RN Entered By: Elpidio EricAfful, Rita on 03/03/2015 10:22:19 [name], Leah HuhNANCY L. ([id number]) -------------------------------------------------------------------------------- Pain Assessment Details Patient Name: Leah GooPENDER, Alsie L. Date of Service: 03/03/2015 9:00 AM Medical Record Number: [id number] Patient Account Number: [id number] Date of Birth/Sex: 10-01-1912 (81 y.o. Female) Treating RN: Clover MealyAfful, RN,  BSN, [employer] Primary Care Physician: PATIENT, NO Other Clinician: Referring Physician: Marisue IvanLinthavong, Kanhka Treating Physician/Extender: Rudene ReBritto, Errol Weeks in Treatment: 0 Active Problems Location of Pain Severity and Description of Pain Patient Has Paino No Site Locations Pain Management and Medication Current Pain Management: Electronic Signature(s) Signed: 03/03/2015 4:18:25 PM By: Elpidio EricAfful, Rita BSN, RN Entered By: Elpidio EricAfful, Rita on 03/03/2015 09:17:51 [name], Leah HuhNANCY L. ([id number]) -------------------------------------------------------------------------------- Wound Assessment Details Patient Name: Leah GooPENDER, Luma L. Date of Service: 03/03/2015 9:00 AM Medical Record Number: [id number] Patient Account Number: [id number] Date of Birth/Sex: 10-01-1912 (81 y.o. Female) Treating RN: [name], RN, BSN, Psychologist, clinicalita Primary Care Physician: PATIENT, NO Other Clinician: Referring Physician: Marisue IvanLinthavong, Kanhka Treating Physician/Extender: Rudene ReBritto, Errol Weeks in Treatment: 0 Wound Status Wound Number: 1 Primary Etiology: Skin Tear Wound Location: Left Lower Leg - Anterior Wound Status: Open Wounding Event: Trauma Date Acquired: 01/31/2015 Weeks Of Treatment: 0 Clustered Wound: No Photos Photo Uploaded By: Elpidio EricAfful, Rita on 03/03/2015 16:16:34 Wound Measurements Length: (cm) 8 Width: (cm) 5.7 Depth: (cm) 0.3 Area: (cm) 35.814 Volume: (cm) 10.744 % Reduction in Area: 0% % Reduction in Volume: 0% Epithelialization: None Tunneling: No Undermining: No Wound Description Full Thickness Without Exposed Classification: Support Structures Wound Margin: Distinct, outline attached Exudate Medium Amount: Exudate Type: Serosanguineous Exudate Color: red, brown Foul Odor After Cleansing: No Wound Bed Granulation Amount: None Present (0%) Exposed Structure Necrotic Amount: Large (67-100%) Fascia Exposed: No Necrotic Quality: Eschar, Adherent Slough Fat Layer Exposed: No Mashaw, Zakari L.  ([id number]) Tendon Exposed: No Muscle Exposed: No Joint Exposed: No Bone Exposed: No Limited to Skin Breakdown Periwound Skin Texture Texture Color No Abnormalities Noted: No No Abnormalities Noted: No Callus: No Atrophie Blanche: No Crepitus: No Cyanosis: No Excoriation: No Ecchymosis: No Fluctuance: No Erythema: No Friable: No Hemosiderin Staining: No Induration: No Mottled: No Localized Edema: Yes Pallor: No Rash: No Rubor: No Scarring: No Temperature / Pain Moisture Temperature: No Abnormality No Abnormalities Noted: No [name] / Scaly: No Maceration: No Moist: Yes Wound Preparation Ulcer Cleansing: Rinsed/Irrigated with Saline Topical Anesthetic Applied: Other: Lidocaine 4%, Treatment Notes Wound #1 (Left,  Anterior Lower Leg) 1. Cleansed with: Clean wound with Normal Saline 3. Peri-wound Care: Skin Prep 4. Dressing Applied: Santyl Ointment 5. Secondary Dressing Applied Gauze and Kerlix/Conform 7. Secured with Secretary/administrator) Signed: 03/03/2015 4:18:25 PM By: Elpidio Eric BSN, RN Entered By: Elpidio Eric on 03/03/2015 09:31:33 [name], [name] ([id number]) -------------------------------------------------------------------------------- Wound Assessment Details Patient Name: [name] Date of Service: 03/03/2015 9:00 AM Medical Record Number: [id number] Patient Account Number: [id number] Date of Birth/Sex: [birth year] (81 y.o. Female) Treating RN: [name], RN, BSN, Psychologist, clinical Primary Care Physician: PATIENT, NO Other Clinician: Referring Physician: Marisue Ivan Treating Physician/Extender: Rudene Re in Treatment: 0 Wound Status Wound Number: 2 Primary Etiology: Skin Tear Wound Location: Right Lower Leg - Anterior Wound Status: Open Wounding Event: Trauma Date Acquired: 01/31/2015 Weeks Of Treatment: 0 Clustered Wound: No Photos Photo Uploaded By: Elpidio Eric on 03/03/2015 16:16:50 Wound Measurements Length: (cm) 3.2 Width:  (cm) 2 Depth: (cm) 0.3 Area: (cm) 5.027 Volume: (cm) 1.508 % Reduction in Area: 0% % Reduction in Volume: 0% Epithelialization: None Tunneling: No Undermining: No Wound Description Full Thickness Without Exposed Classification: Support Structures Wound Margin: Distinct, outline attached Exudate Medium Amount: Exudate Type: Serosanguineous Exudate Color: red, brown Foul Odor After Cleansing: No Wound Bed Granulation Amount: None Present (0%) Exposed Structure Necrotic Amount: Large (67-100%) Fascia Exposed: No Necrotic Quality: Adherent Slough Fat Layer Exposed: No Neidlinger, Lucely L. ([id number]) Tendon Exposed: No Muscle Exposed: No Joint Exposed: No Bone Exposed: No Limited to Skin Breakdown Periwound Skin Texture Texture Color No Abnormalities Noted: No No Abnormalities Noted: No Callus: No Atrophie Blanche: No Crepitus: No Cyanosis: No Excoriation: No Ecchymosis: No Fluctuance: No Erythema: No Friable: No Hemosiderin Staining: No Induration: No Mottled: No Localized Edema: Yes Pallor: No Rash: No Rubor: No Scarring: No Temperature / Pain Moisture Temperature: No Abnormality No Abnormalities Noted: No Tenderness on Palpation: Yes [name] / Scaly: No Maceration: No Moist: No Wound Preparation Ulcer Cleansing: Rinsed/Irrigated with Saline Topical Anesthetic Applied: Other: lidocaine 4% ceam, Treatment Notes Wound #2 (Right, Anterior Lower Leg) 1. Cleansed with: Clean wound with Normal Saline 3. Peri-wound Care: Skin Prep 4. Dressing Applied: Santyl Ointment 5. Secondary Dressing Applied Gauze and Kerlix/Conform 7. Secured with Secretary/administrator) Signed: 03/03/2015 4:18:25 PM By: Elpidio Eric BSN, RN Entered By: Elpidio Eric on 03/03/2015 09:33:01 [name], [name] ([id number]) -------------------------------------------------------------------------------- Vitals Details Patient Name: [name] Date of Service: 03/03/2015 9:00  AM Medical Record Number: [id number] Patient Account Number: [id number] Date of Birth/Sex: 1913-04-16 (81 y.o. Female) Treating RN: [name], RN, BSN, Rita Primary Care Physician: PATIENT, NO Other Clinician: Referring Physician: Marisue Ivan Treating Physician/Extender: Rudene Re in Treatment: 0 Vital Signs Time Taken: 09:18 Temperature (F): 97.8 Height (in): 64 Pulse (bpm): 66 Source: Stated Respiratory Rate (breaths/min): 16 Weight (lbs): 147 Blood Pressure (mmHg): 143/60 Source: Stated Reference Range: 80 - 120 mg / dl Body Mass Index (BMI): 25.2 Electronic Signature(s) Signed: 03/03/2015 4:18:25 PM By: Elpidio Eric BSN, RN Entered By: Elpidio Eric on 03/03/2015 09:18:40

## 2015-03-04 NOTE — Progress Notes (Signed)
[name], Ritu L. ([id number]) Visit Report for 03/03/2015 Abuse/Suicide Risk Screen Details Patient Name: [name], Leah L. Date of Service: 03/03/2015 9:00 AM Medical Record Patient Account Number: [id number] Number: [name], RN, BSN, Treating RN: 1913/08/14 (81 y.o. [place] Date of Birth/Sex: Female) Other Clinician: Primary Care Physician: PATIENT, NO Treating Britto, Errol Referring Physician: Marisue IvanLinthavong, Kanhka Physician/Extender: Weeks in Treatment: 0 Abuse/Suicide Risk Screen Items Answer ABUSE/SUICIDE RISK SCREEN: Has anyone close to you tried to hurt or harm you recentlyo No Do you feel uncomfortable with anyone in your familyo No Has anyone forced you do things that you didnot want to doo No Do you have any thoughts of harming yourselfo No Patient displays signs or symptoms of abuse and/or neglect. No Electronic Signature(s) Signed: 03/03/2015 4:18:25 PM By: Elpidio EricAfful, Rita BSN, RN Entered By: Elpidio EricAfful, Rita on 03/03/2015 09:44:00 Macioce, Lelon HuhNANCY L. ([id number]) -------------------------------------------------------------------------------- Activities of Daily Living Details Patient Name: [name], Leah L. Date of Service: 03/03/2015 9:00 AM Medical Record Patient Account Number: [id number] Number: [name], RN, BSN, Treating RN: 1913/08/14 (81 y.o. [place] Date of Birth/Sex: Female) Other Clinician: Primary Care Physician: PATIENT, NO Treating Britto, Errol Referring Physician: Marisue IvanLinthavong, Kanhka Physician/Extender: Weeks in Treatment: 0 Activities of Daily Living Items Answer Activities of Daily Living (Please select one for each item) Drive Automobile Not Able Take Medications Need Assistance Use Telephone Need Assistance Care for Appearance Need Assistance Use Toilet Need Assistance Bath / Shower Need Assistance Dress Self Need Assistance Feed Self Need Assistance Walk Need Assistance Get In / Out Bed Need Assistance Housework Need Assistance Prepare Meals  Need Assistance Handle Money Need Assistance Shop for Self Need Assistance Electronic Signature(s) Signed: 03/03/2015 4:18:25 PM By: Elpidio EricAfful, Rita BSN, RN Entered By: Elpidio EricAfful, Rita on 03/03/2015 09:17:09 Sam, Lelon HuhNANCY L. ([id number]) -------------------------------------------------------------------------------- Education Assessment Details Patient Name: [name], Kailene L. Date of Service: 03/03/2015 9:00 AM Medical Record Patient Account Number: [id number] Number: [name], RN, BSN, Treating RN: 1913/08/14 (81 y.o. [place] S[place]ita Date of Birth/Sex: Female) Other Clinician: Primary Care Physician: PATIENT, NO Treating Britto, Errol Referring Physician: Marisue IvanLinthavong, Kanhka Physician/Extender: Tania AdeWeeks in Treatment: 0 Primary Learner Assessed: Patient Learning Preferences/Education Level/Primary Language Learning Preference: Explanation Highest Education Level: High School Preferred Language: English Cognitive Barrier Assessment/Beliefs Language Barrier: No Physical Barrier Assessment Impaired Vision: Yes Glasses Impaired Hearing: Yes Hearing Aid Decreased Hand dexterity: No Knowledge/Comprehension Assessment Knowledge Level: Medium Comprehension Level: Medium Ability to understand written Medium instructions: Ability to understand verbal Medium instructions: Motivation Assessment Anxiety Level: Calm Cooperation: Cooperative Education Importance: Acknowledges Need Interest in Health Problems: Asks Questions Perception: Coherent Willingness to Engage in Self- Medium Management Activities: Readiness to Engage in Self- Medium Management Activities: Electronic Signature(s) Signed: 03/03/2015 4:18:25 PM By: Elpidio EricAfful, Rita BSN, RN Entered By: Elpidio EricAfful, Rita on 03/03/2015 09:16:35 Ha, Lelon HuhNANCY L. ([id number]) Gawron, Lelon HuhNANCY L. ([id number]) -------------------------------------------------------------------------------- Fall Risk Assessment Details Patient Name: [name], Leah L. Date of  Service: 03/03/2015 9:00 AM Medical Record Patient Account Number: [id number] Number: [name], RN, BSN, Treating RN: 1913/08/14 (81 y.o. [place] Date of Birth/Sex: Female) Other Clinician: Primary Care Physician: PATIENT, NO Treating Britto, Errol Referring Physician: Marisue IvanLinthavong, Kanhka Physician/Extender: Weeks in Treatment: 0 Fall Risk Assessment Items FALL RISK ASSESSMENT: History of falling - immediate or within 3 months 25 Yes Secondary diagnosis 0 No Ambulatory aid None/bed rest/wheelchair/nurse 0 Yes Crutches/cane/walker 0 No Furniture 0 No IV Access/Saline Lock 0 No Gait/Training Normal/bed rest/immobile 0 Yes Weak 10 Yes Impaired 20 Yes Mental Status Oriented to own ability 0 Yes Electronic Signature(s) Signed: 03/03/2015  4:18:25 PM By: Elpidio Eric BSN, RN Entered By: Elpidio Eric on 03/03/2015 09:15:35 Sohm, [name] ([id number]) -------------------------------------------------------------------------------- Foot Assessment Details Patient Name: [name] Date of Service: 03/03/2015 9:00 AM Medical Record Patient Account Number: [id number] Number: [name], RN, BSN, Treating RN: Jan 13, 1913 (81 y.o. [place] Date of Birth/Sex: Female) Other Clinician: Primary Care Physician: PATIENT, NO Treating Britto, Errol Referring Physician: Marisue Ivan Physician/Extender: Weeks in Treatment: 0 Foot Assessment Items Site Locations + = Sensation present, - = Sensation absent, C = Callus, U = Ulcer R = Redness, W = Warmth, M = Maceration, PU = Pre-ulcerative lesion F = Fissure, S = Swelling, D = Dryness Assessment Right: Left: Other Deformity: No No Prior Foot Ulcer: No No Prior Amputation: No No Charcot Joint: No No Ambulatory Status: Ambulatory With Help Assistance Device: Wheelchair Gait: Surveyor, mining) Signed: 03/03/2015 4:18:25 PM By: Elpidio Eric BSN, RN Entered By: Elpidio Eric on 03/03/2015 09:15:02 Stevick, [name]  ([id number]) Tani, [name] ([id number]) -------------------------------------------------------------------------------- Nutrition Risk Assessment Details Patient Name: [name] Date of Service: 03/03/2015 9:00 AM Medical Record Patient Account Number: [id number] Number: [name], RN, BSN, Treating RN: 29-Mar-1913 (81 y.o. [place] Date of Birth/Sex: Female) Other Clinician: Primary Care Physician: PATIENT, NO Treating Britto, Errol Referring Physician: Marisue Ivan Physician/Extender: Weeks in Treatment: 0 Height (in): Weight (lbs): Body Mass Index (BMI): Nutrition Risk Assessment Items NUTRITION RISK SCREEN: I have an illness or condition that made me change the kind and/or 0 No amount of food I eat I eat fewer than two meals per day 0 No I eat few fruits and vegetables, or milk products 0 No I have three or more drinks of beer, liquor or wine almost every day 0 No I have tooth or mouth problems that make it hard for me to eat 0 No I don't always have enough money to buy the food I need 0 No I eat alone most of the time 0 No I take three or more different prescribed or over-the-counter drugs a 0 No day Without wanting to, I have lost or gained 10 pounds in the last six 0 No months I am not always physically able to shop, cook and/or feed myself 0 No Nutrition Protocols Good Risk Protocol 0 No interventions needed Moderate Risk Protocol Electronic Signature(s) Signed: 03/03/2015 4:18:25 PM By: Elpidio Eric BSN, RN Entered By: Elpidio Eric on 03/03/2015 09:15:11

## 2015-03-10 ENCOUNTER — Encounter: Payer: Medicare Other | Admitting: Surgery

## 2015-03-10 DIAGNOSIS — L97212 Non-pressure chronic ulcer of right calf with fat layer exposed: Secondary | ICD-10-CM | POA: Diagnosis not present

## 2015-03-10 NOTE — Progress Notes (Signed)
VONITA, CALLOWAY ([id number]) Visit Report for 03/10/2015 Arrival Information Details Patient Name: [name], [name] Date of Service: 03/10/2015 1:45 PM Medical Record Number: [id number] Patient Account Number: [id number] Date of Birth/Sex: 28-Jan-1913 (81 y.o. Female) Treating RN: [name], RN, BSN, [employer] Primary Care Physician: PATIENT, NO Other Clinician: Referring Physician: Marisue Ivan Treating Physician/Extender: Rudene Re in Treatment: 1 Visit Information History Since Last Visit Any new allergies or adverse reactions: No Patient Arrived: Wheel Chair Had a fall or experienced change in No activities of daily living that may affect Arrival Time: 14:01 risk of falls: Accompanied By: caregiver Signs or symptoms of abuse/neglect since last No Transfer Assistance: Manual visito Patient Identification Verified: Yes Hospitalized since last visit: No Secondary Verification Process Yes Has Dressing in Place as Prescribed: Yes Completed: Pain Present Now: No Patient Requires Transmission-Based No Precautions: Patient Has Alerts: Yes Notes PATIENT takes longer to move from wheelchair to treatment chair due to fear of falling. Needs 2 person assist. Electronic Signature(s) Signed: 03/10/2015 3:08:25 PM By: Elpidio Eric BSN, RN Previous Signature: 03/10/2015 2:01:47 PM Version By: Elpidio Eric BSN, RN Entered By: Elpidio Eric on 03/10/2015 15:08:25 Mccook, [name] ([id number]) -------------------------------------------------------------------------------- Encounter Discharge Information Details Patient Name: [name] Date of Service: 03/10/2015 1:45 PM Medical Record Number: [id number] Patient Account Number: [id number] Date of Birth/Sex: Aug 16, 1913 (81 y.o. Female) Treating RN: Clover Mealy, RN, BSN, [place] [employer] Primary Care Physician: PATIENT, NO Other Clinician: Referring Physician: Marisue Ivan Treating Physician/Extender: Rudene Re in Treatment:  1 Encounter Discharge Information Items Discharge Pain Level: 0 Discharge Condition: Stable Ambulatory Status: Wheelchair Nursing Discharge Destination: Home Transportation: Private Auto Accompanied By: caregiver Schedule Follow-up Appointment: No Medication Reconciliation completed No and provided to Patient/Care Zadkiel Dragan: Clinical Summary of Care: Electronic Signature(s) Signed: 03/10/2015 2:48:28 PM By: Elpidio Eric BSN, RN Entered By: Elpidio Eric on 03/10/2015 14:48:28 Fullenwider, [name] ([id number]) -------------------------------------------------------------------------------- Lower Extremity Assessment Details Patient Name: [name] Date of Service: 03/10/2015 1:45 PM Medical Record Number: [id number] Patient Account Number: [id number] Date of Birth/Sex: Jan 31, 1913 (81 y.o. Female) Treating RN: [name], RN, BSN, [employer] Primary Care Physician: PATIENT, NO Other Clinician: Referring Physician: Marisue Ivan Treating Physician/Extender: Rudene Re in Treatment: 1 Edema Assessment Assessed: [Left: No] [Right: No] E[Left: dema] [Right: :] Calf Left: Right: Point of Measurement: 35 cm From Medial Instep 35 cm 35.2 cm Ankle Left: Right: Point of Measurement: 8 cm From Medial Instep 25.2 cm 25.2 cm Vascular Assessment Pulses: Posterior Tibial Extremity colors, hair growth, and conditions: Extremity Color: [Left:Mottled] [Right:Mottled] Hair Growth on Extremity: [Left:No] [Right:No] Temperature of Extremity: [Left:Warm] [Right:Cool] Capillary Refill: [Left:< 3 seconds] [Right:< 3 seconds] Toe Nail Assessment Left: Right: Thick: Yes Yes Discolored: Yes Yes Deformed: No No Improper Length and Hygiene: No No Electronic Signature(s) Signed: 03/10/2015 2:11:31 PM By: Elpidio Eric BSN, RN Entered By: Elpidio Eric on 03/10/2015 14:11:31 Arenz, [name] ([id number]) -------------------------------------------------------------------------------- Multi Wound  Chart Details Patient Name: [name] Date of Service: 03/10/2015 1:45 PM Medical Record Number: [id number] Patient Account Number: [id number] Date of Birth/Sex: 06/13/1913 (81 y.o. Female) Treating RN: Clover Mealy, RN, BSN, [employer] Primary Care Physician: PATIENT, NO Other Clinician: Referring Physician: Marisue Ivan Treating Physician/Extender: Rudene Re in Treatment: 1 Vital Signs Height(in): 64 Pulse(bpm): 63 Weight(lbs): 147 Blood Pressure 147/60 (mmHg): Body Mass Index(BMI): 25 Temperature(F): 97.7 Respiratory Rate 16 (breaths/min): Photos: [1:No Photos] [2:No Photos] [N/A:N/A] Wound Location: [1:Left Lower Leg - Anterior] [2:Right Lower Leg - Anterior N/A] Wounding Event: [1:Trauma] [2:Trauma] [N/A:N/A] Primary Etiology: [  1:Skin Tear] [2:Skin Tear] [N/A:N/A] Comorbid History: [1:Hypertension, Osteoarthritis] [2:Hypertension, Osteoarthritis] [N/A:N/A] Date Acquired: [1:01/31/2015] [2:01/31/2015] [N/A:N/A] Weeks of Treatment: [1:1] [2:1] [N/A:N/A] Wound Status: [1:Open] [2:Open] [N/A:N/A] Measurements L x W x D 6.5x6x0.4 [2:3x1.5x0.2] [N/A:N/A] (cm) Area (cm) : [1:30.631] [2:3.534] [N/A:N/A] Volume (cm) : [1:12.252] [2:0.707] [N/A:N/A] % Reduction in Area: [1:14.50%] [2:29.70%] [N/A:N/A] % Reduction in Volume: -14.00% [2:53.10%] [N/A:N/A] Classification: [1:Full Thickness Without Exposed Support Structures] [2:Full Thickness Without Exposed Support Structures] [N/A:N/A] Exudate Amount: [1:Medium] [2:Medium] [N/A:N/A] Exudate Type: [1:Serosanguineous] [2:Serosanguineous] [N/A:N/A] Exudate Color: [1:red, brown] [2:red, brown] [N/A:N/A] Wound Margin: [1:Distinct, outline attached] [2:Distinct, outline attached] [N/A:N/A] Granulation Amount: [1:Small (1-33%)] [2:None Present (0%)] [N/A:N/A] Granulation Quality: [1:Pink, Pale] [2:N/A] [N/A:N/A] Necrotic Amount: [1:Large (67-100%)] [2:Large (67-100%)] [N/A:N/A] Necrotic Tissue: [1:Eschar, Adherent Slough]  [2:Adherent Slough] [N/A:N/A] Exposed Structures: [1:Fascia: No Fat: No Tendon: No Muscle: No] [2:Fascia: No Fat: No Tendon: No Muscle: No] [N/A:N/A] Joint: No Joint: No Bone: No Bone: No Limited to Skin Limited to Skin Breakdown Breakdown Epithelialization: Small (1-33%) Small (1-33%) N/A Periwound Skin Texture: Edema: Yes Edema: Yes N/A Excoriation: No Excoriation: No Induration: No Induration: No Callus: No Callus: No Crepitus: No Crepitus: No Fluctuance: No Fluctuance: No Friable: No Friable: No Rash: No Rash: No Scarring: No Scarring: No Periwound Skin Moist: Yes Moist: Yes N/A Moisture: Maceration: No Maceration: No Dry/Scaly: No Dry/Scaly: No Periwound Skin Color: Atrophie Blanche: No Atrophie Blanche: No N/A Cyanosis: No Cyanosis: No Ecchymosis: No Ecchymosis: No Erythema: No Erythema: No Hemosiderin Staining: No Hemosiderin Staining: No Mottled: No Mottled: No Pallor: No Pallor: No Rubor: No Rubor: No Temperature: No Abnormality No Abnormality N/A Tenderness on No Yes N/A Palpation: Wound Preparation: Ulcer Cleansing: Ulcer Cleansing: N/A Rinsed/Irrigated with Rinsed/Irrigated with Saline Saline Topical Anesthetic Topical Anesthetic Applied: Other: Lidocaine Applied: Other: lidocaine 4% 4% ceam Treatment Notes Electronic Signature(s) Signed: 03/10/2015 2:41:30 PM By: Elpidio Eric BSN, RN Entered By: Elpidio Eric on 03/10/2015 14:41:30 Biller, [name] ([id number]) -------------------------------------------------------------------------------- Multi-Disciplinary Care Plan Details Patient Name: [name] Date of Service: 03/10/2015 1:45 PM Medical Record Number: [id number] Patient Account Number: [id number] Date of Birth/Sex: Jul 30, 1913 (81 y.o. Female) Treating RN: [name], RN, BSN, [employer] Primary Care Physician: PATIENT, NO Other Clinician: Referring Physician: Marisue Ivan Treating Physician/Extender: Rudene Re in  Treatment: 1 Active Inactive Abuse / Safety / Falls / Self Care Management Nursing Diagnoses: Impaired home maintenance Impaired physical mobility Potential for falls Self care deficit: actual or potential Goals: Patient will remain injury free Date Initiated: 03/03/2015 Goal Status: Active Patient/caregiver will verbalize understanding of skin care regimen Date Initiated: 03/03/2015 Goal Status: Active Patient/caregiver will verbalize/demonstrate measure taken to improve self care Date Initiated: 03/03/2015 Goal Status: Active Patient/caregiver will verbalize/demonstrate measures taken to improve the patient's personal safety Date Initiated: 03/03/2015 Goal Status: Active Patient/caregiver will verbalize/demonstrate measures taken to prevent injury and/or falls Date Initiated: 03/03/2015 Goal Status: Active Patient/caregiver will verbalize/demonstrate understanding of what to do in case of emergency Date Initiated: 03/03/2015 Goal Status: Active Interventions: Assess: immobility, friction, shearing, incontinence upon admission and as needed Assess impairment of mobility on admission and as needed per policy Provide education on basic hygiene Provide education on personal and home safety Provide education on safe transfers CLARRISA, KAYLOR ([id number]) Notes: Orientation to the Wound Care Program Nursing Diagnoses: Knowledge deficit related to the wound healing center program Goals: Patient/caregiver will verbalize understanding of the Wound Healing Center Program Date Initiated: 03/03/2015 Goal Status: Active Interventions: Provide education on orientation to the wound center Notes: Venous Leg  Ulcer Nursing Diagnoses: Knowledge deficit related to disease process and management Potential for venous Insuffiency (use before diagnosis confirmed) Goals: Patient will maintain optimal edema control Date Initiated: 03/03/2015 Goal Status: Active Patient/caregiver will verbalize  understanding of disease process and disease management Date Initiated: 03/03/2015 Goal Status: Active Verify adequate tissue perfusion prior to therapeutic compression application Date Initiated: 03/03/2015 Goal Status: Active Interventions: Assess peripheral edema status every visit. Compression as ordered Provide education on venous insufficiency Treatment Activities: Test ordered outside of clinic : 03/10/2015 Notes: Wound/Skin Impairment Renfro, Dustyn L. ([id number]) Nursing Diagnoses: Impaired tissue integrity Knowledge deficit related to ulceration/compromised skin integrity Goals: Patient/caregiver will verbalize understanding of skin care regimen Date Initiated: 03/03/2015 Goal Status: Active Ulcer/skin breakdown will have a volume reduction of 30% by week 4 Date Initiated: 03/03/2015 Goal Status: Active Ulcer/skin breakdown will have a volume reduction of 50% by week 8 Date Initiated: 03/03/2015 Goal Status: Active Ulcer/skin breakdown will have a volume reduction of 80% by week 12 Date Initiated: 03/03/2015 Goal Status: Active Ulcer/skin breakdown will heal within 14 weeks Date Initiated: 03/03/2015 Goal Status: Active Interventions: Assess patient/caregiver ability to perform ulcer/skin care regimen upon admission and as needed Assess ulceration(s) every visit Provide education on ulcer and skin care Notes: Electronic Signature(s) Signed: 03/10/2015 2:40:59 PM By: Elpidio EricAfful, Rita BSN, RN Entered By: Elpidio EricAfful, Rita on 03/10/2015 14:40:59 Tiger, Lelon HuhNANCY L. ([id number]) -------------------------------------------------------------------------------- Pain Assessment Details Patient Name: Leah GooPENDER, Lailani L. Date of Service: 03/10/2015 1:45 PM Medical Record Number: [id number] Patient Account Number: [id number] Date of Birth/Sex: Sep 11, 1912 (81 y.o. Female) Treating RN: Clover MealyAfful, RN, BSN, [place] [employer] Primary Care Physician: PATIENT, NO Other Clinician: Referring Physician: Marisue IvanLinthavong,  Kanhka Treating Physician/Extender: Rudene ReBritto, Errol Weeks in Treatment: 1 Active Problems Location of Pain Severity and Description of Pain Patient Has Paino No Site Locations Pain Management and Medication Current Pain Management: Electronic Signature(s) Signed: 03/10/2015 2:01:54 PM By: Elpidio EricAfful, Rita BSN, RN Entered By: Elpidio EricAfful, Rita on 03/10/2015 14:01:53 Mozer, Lelon HuhNANCY L. ([id number]) -------------------------------------------------------------------------------- Patient/Caregiver Education Details Patient Name: Leah GooPENDER, Erilyn L. Date of Service: 03/10/2015 1:45 PM Medical Record Number: [id number] Patient Account Number: [id number] Date of Birth/Gender: Sep 11, 1912 (81 y.o. Female) Treating RN: Clover MealyAfful, RN, BSN, [employer] Primary Care Physician: PATIENT, NO Other Clinician: Referring Physician: Marisue IvanLinthavong, Kanhka Treating Physician/Extender: Rudene ReBritto, Errol Weeks in Treatment: 1 Education Assessment Education Provided To: Patient and Caregiver Education Topics Provided Basic Hygiene: Methods: Explain/Verbal Safety: Methods: Explain/Verbal Responses: State content correctly Venous: Methods: Explain/Verbal Responses: State content correctly Welcome To The Wound Care Center: Methods: Explain/Verbal Wound/Skin Impairment: Methods: Explain/Verbal Responses: State content correctly Electronic Signature(s) Signed: 03/10/2015 2:49:02 PM By: Elpidio EricAfful, Rita BSN, RN Entered By: Elpidio EricAfful, Rita on 03/10/2015 14:49:01 Shankman, Lelon HuhNANCY L. ([id number]) -------------------------------------------------------------------------------- Wound Assessment Details Patient Name: Leah GooPENDER, Khadejah L. Date of Service: 03/10/2015 1:45 PM Medical Record Number: [id number] Patient Account Number: [id number] Date of Birth/Sex: Sep 11, 1912 (81 y.o. Female) Treating RN: [name], RN, BSN, Psychologist, clinicalita Primary Care Physician: PATIENT, NO Other Clinician: Referring Physician: Marisue IvanLinthavong, Kanhka Treating Physician/Extender: Rudene ReBritto,  Errol Weeks in Treatment: 1 Wound Status Wound Number: 1 Primary Etiology: Skin Tear Wound Location: Left Lower Leg - Anterior Wound Status: Open Wounding Event: Trauma Comorbid History: Hypertension, Osteoarthritis Date Acquired: 01/31/2015 Weeks Of Treatment: 1 Clustered Wound: No Photos Photo Uploaded By: Elpidio EricAfful, Rita on 03/10/2015 17:37:42 Wound Measurements Length: (cm) 6.5 Width: (cm) 6 Depth: (cm) 0.4 Area: (cm) 30.631 Volume: (cm) 12.252 % Reduction in Area: 14.5% % Reduction in Volume: -14% Epithelialization: Small (1-33%) Tunneling: No Undermining: No Wound Description Full Thickness Without  Exposed Classification: Support Structures Wound Margin: Distinct, outline attached Exudate Medium Amount: Exudate Type: Serosanguineous Exudate Color: red, brown Foul Odor After Cleansing: No Wound Bed Granulation Amount: Small (1-33%) Exposed Structure Granulation Quality: Pink, Pale Fascia Exposed: No Necrotic Amount: Large (67-100%) Fat Layer Exposed: No Gut, Zalma L. ([id number]) Necrotic Quality: Eschar, Adherent Slough Tendon Exposed: No Muscle Exposed: No Joint Exposed: No Bone Exposed: No Limited to Skin Breakdown Periwound Skin Texture Texture Color No Abnormalities Noted: No No Abnormalities Noted: No Callus: No Atrophie Blanche: No Crepitus: No Cyanosis: No Excoriation: No Ecchymosis: No Fluctuance: No Erythema: No Friable: No Hemosiderin Staining: No Induration: No Mottled: No Localized Edema: Yes Pallor: No Rash: No Rubor: No Scarring: No Temperature / Pain Moisture Temperature: No Abnormality No Abnormalities Noted: No Dry / Scaly: No Maceration: No Moist: Yes Wound Preparation Ulcer Cleansing: Rinsed/Irrigated with Saline Topical Anesthetic Applied: Other: Lidocaine 4%, Treatment Notes Wound #1 (Left, Anterior Lower Leg) 1. Cleansed with: Clean wound with Normal Saline 3. Peri-wound Care: Skin Prep 4. Dressing  Applied: Santyl Ointment 5. Secondary Dressing Applied Gauze and Kerlix/Conform 7. Secured with Secretary/administrator) Signed: 03/10/2015 2:13:57 PM By: Elpidio Eric BSN, RN Entered By: Elpidio Eric on 03/10/2015 14:13:57 Hemsley, [name] ([id number]) -------------------------------------------------------------------------------- Wound Assessment Details Patient Name: [name] Date of Service: 03/10/2015 1:45 PM Medical Record Number: [id number] Patient Account Number: [id number] Date of Birth/Sex: Nov 30, 1912 (81 y.o. Female) Treating RN: [name], RN, BSN, Psychologist, clinical Primary Care Physician: PATIENT, NO Other Clinician: Referring Physician: Marisue Ivan Treating Physician/Extender: Rudene Re in Treatment: 1 Wound Status Wound Number: 2 Primary Etiology: Skin Tear Wound Location: Right Lower Leg - Anterior Wound Status: Open Wounding Event: Trauma Comorbid History: Hypertension, Osteoarthritis Date Acquired: 01/31/2015 Weeks Of Treatment: 1 Clustered Wound: No Photos Photo Uploaded By: Elpidio Eric on 03/10/2015 17:37:43 Wound Measurements Length: (cm) 3 Width: (cm) 1.5 Depth: (cm) 0.2 Area: (cm) 3.534 Volume: (cm) 0.707 % Reduction in Area: 29.7% % Reduction in Volume: 53.1% Epithelialization: Small (1-33%) Tunneling: No Undermining: No Wound Description Full Thickness Without Exposed Classification: Support Structures Wound Margin: Distinct, outline attached Exudate Medium Amount: Exudate Type: Serosanguineous Exudate Color: red, brown Foul Odor After Cleansing: No Wound Bed Granulation Amount: None Present (0%) Exposed Structure Necrotic Amount: Large (67-100%) Fascia Exposed: No Necrotic Quality: Adherent Slough Fat Layer Exposed: No Leis, Manette L. ([id number]) Tendon Exposed: No Muscle Exposed: No Joint Exposed: No Bone Exposed: No Limited to Skin Breakdown Periwound Skin Texture Texture Color No Abnormalities Noted: No No  Abnormalities Noted: No Callus: No Atrophie Blanche: No Crepitus: No Cyanosis: No Excoriation: No Ecchymosis: No Fluctuance: No Erythema: No Friable: No Hemosiderin Staining: No Induration: No Mottled: No Localized Edema: Yes Pallor: No Rash: No Rubor: No Scarring: No Temperature / Pain Moisture Temperature: No Abnormality No Abnormalities Noted: No Tenderness on Palpation: Yes Dry / Scaly: No Maceration: No Moist: Yes Wound Preparation Ulcer Cleansing: Rinsed/Irrigated with Saline Topical Anesthetic Applied: Other: lidocaine 4% ceam, Treatment Notes Wound #2 (Right, Anterior Lower Leg) 1. Cleansed with: Clean wound with Normal Saline 3. Peri-wound Care: Skin Prep 4. Dressing Applied: Santyl Ointment 5. Secondary Dressing Applied Gauze and Kerlix/Conform 7. Secured with Secretary/administrator) Signed: 03/10/2015 2:14:12 PM By: Elpidio Eric BSN, RN Entered By: Elpidio Eric on 03/10/2015 14:14:12 Furey, [name] ([id number]) -------------------------------------------------------------------------------- Vitals Details Patient Name: [name] Date of Service: 03/10/2015 1:45 PM Medical Record Number: [id number] Patient Account Number: [id number] Date of Birth/Sex: 05-May-1913 (81 y.o. Female) Treating RN: [name],  RN, BSN, Psychologist, clinical Primary Care Physician: PATIENT, NO Other Clinician: Referring Physician: Marisue Ivan Treating Physician/Extender: Rudene Re in Treatment: 1 Vital Signs Time Taken: 14:11 Temperature (F): 97.7 Height (in): 64 Pulse (bpm): 63 Weight (lbs): 147 Respiratory Rate (breaths/min): 16 Body Mass Index (BMI): 25.2 Blood Pressure (mmHg): 147/60 Reference Range: 80 - 120 mg / dl Electronic Signature(s) Signed: 03/10/2015 2:12:00 PM By: Elpidio Eric BSN, RN Entered By: Elpidio Eric on 03/10/2015 14:12:00

## 2015-03-11 NOTE — Progress Notes (Signed)
LETTI, TOWELL ([id number]) Visit Report for 03/10/2015 Chief Complaint Document Details Patient Name: [name], [name] Date of Service: 03/10/2015 1:45 PM Medical Record Patient Account Number: [id number] Number: [name], RN, BSN, Treating RN: [birth year] [phone] y.o. [place] Yu Date of Birth/Sex: Female) Other Clinician: Primary Care Physician: PATIENT, NO Treating Elleni Mozingo Referring Physician: Marisue Ivan Physician/Extender: Weeks in Treatment: 1 Information Obtained from: Patient Chief Complaint Patient seen for complaints of Non-Healing Wound. pleasant 81 year old patient who had a fall and injured both lower extremities on 01/31/2015 Electronic Signature(s) Signed: 03/10/2015 3:24:11 PM By: Evlyn Kanner MD, FACS Entered By: Evlyn Kanner on 03/10/2015 15:24:11 [name] ([id number]) -------------------------------------------------------------------------------- Debridement Details Patient Name: [name] Date of Service: 03/10/2015 1:45 PM Medical Record Patient Account Number: [id number] Number: [name], RN, BSN, Treating RN: 1913/02/09 (81 y.o. [place] Yu Date of Birth/Sex: Female) Other Clinician: Primary Care Physician: PATIENT, NO Treating Madoline Bhatt Referring Physician: Marisue Ivan Physician/Extender: Weeks in Treatment: 1 Debridement Performed for Wound #1 Left,Anterior Lower Leg Assessment: Performed By: Physician Tristan Schroeder., MD Debridement: Debridement Pre-procedure Yes Verification/Time Out Taken: Start Time: 14:45 Pain Control: Lidocaine 4% Topical Solution Level: Skin/Subcutaneous Tissue Total Area Debrided (L x 6.5 (cm) x 6 (cm) = 39 (cm) W): Tissue and other Viable, Non-Viable, Exudate, Fibrin/Slough, Subcutaneous material debrided: Instrument: Curette Bleeding: Minimum Hemostasis Achieved: Pressure End Time: 14:50 Procedural Pain: 0 Post Procedural Pain: 0 Response to Treatment: Procedure was  tolerated well Post Debridement Measurements of Total Wound Length: (cm) 6.5 Width: (cm) 6 Depth: (cm) 0.4 Volume: (cm) 12.252 Electronic Signature(s) Signed: 03/10/2015 3:23:53 PM By: Evlyn Kanner MD, FACS Signed: 03/10/2015 5:42:40 PM By: Elpidio Eric BSN, RN Previous Signature: 03/10/2015 2:46:11 PM Version By: Elpidio Eric BSN, RN Entered By: Evlyn Kanner on 03/10/2015 15:23:53 [name] ([id number]) -------------------------------------------------------------------------------- Debridement Details Patient Name: [name] Date of Service: 03/10/2015 1:45 PM Medical Record Patient Account Number: [id number] Number: [name], RN, BSN, Treating RN: Jan 10, 1913 (81 y.o. [place] Yu Date of Birth/Sex: Female) Other Clinician: Primary Care Physician: PATIENT, NO Treating Helmuth Recupero Referring Physician: Marisue Ivan Physician/Extender: Weeks in Treatment: 1 Debridement Performed for Wound #2 Right,Anterior Lower Leg Assessment: Performed By: Physician Tristan Schroeder., MD Debridement: Debridement Pre-procedure Yes Verification/Time Out Taken: Start Time: 14:42 Pain Control: Lidocaine 4% Topical Solution Level: Skin/Subcutaneous Tissue Total Area Debrided (L x 3 (cm) x 1.5 (cm) = 4.5 (cm) W): Tissue and other Viable, Non-Viable, Exudate, Fibrin/Slough, Subcutaneous material debrided: Instrument: Curette Bleeding: Minimum Hemostasis Achieved: Pressure End Time: 14:44 Procedural Pain: 0 Post Procedural Pain: 0 Response to Treatment: Procedure was tolerated well Post Debridement Measurements of Total Wound Length: (cm) 3 Width: (cm) 1.5 Depth: (cm) 0.3 Volume: (cm) 1.06 Electronic Signature(s) Signed: 03/10/2015 3:24:05 PM By: Evlyn Kanner MD, FACS Signed: 03/10/2015 5:42:40 PM By: Elpidio Eric BSN, RN Previous Signature: 03/10/2015 2:44:44 PM Version By: Elpidio Eric BSN, RN Entered By: Evlyn Kanner on 03/10/2015 15:24:05 Reither, Lelon Yu  ([id number]) -------------------------------------------------------------------------------- HPI Details Patient Name: [name] Date of Service: 03/10/2015 1:45 PM Medical Record Patient Account Number: [id number] Number: [name], RN, BSN, Treating RN: 16-Mar-1913 (81 y.o. [name] Date of Birth/Sex: Female) Other Clinician: Primary Care Physician: PATIENT, NO Treating Charley Miske Referring Physician: Marisue Ivan Physician/Extender: Weeks in Treatment: 1 History of Present Illness Location: both lower extremities Quality: Patient reports experiencing a dull pain to affected area(s). Severity: Patient states wound (s) are getting better. Duration: Patient has had the wound for < 4 weeks prior to  presenting for treatment Timing: Pain in wound is Intermittent (comes and goes Context: The wound occurred when the patient had a fall and lacerated both lower extremities. Modifying Factors: Other treatment(s) tried include:she had sutures applied there for a while and when they were removed the wound opened up. Associated Signs and Symptoms: Patient reports having difficulty standing for long periods. HPI Description: 81 year old female who fell and had an injury to her right forehead, right elbow and both shins on 01/31/2015. She went to the ER at [facility] and had been treated appropriately with sutures being placed to her right and left lower extremity. Also had a CT scan which showed no intracranial hemorrhage and only a large scalp hematoma on the right frontal region.she was put on Keflex for 5 days. she had also received Bactrim for 14 days.Since then she has been seen by her PCP and he had put her on Augmentin for 14 days and wound culture was obtained. culture reports were reviewed -- she grew an MRSA sensitive to tetracycline, Bactrim and vancomycin. Her past medical history significant for osteoporosis, hypertension, spinal stenosis, right hip  replacement, appendectomy and abdomen hysterectomy for ovarian cancer. She has been seen in the past by the vascular surgery group at [facility] and she has an appointment to see them again. She is known to use lymphedema pumps while she has been in her assisted living home but has not been using them for the last month. She does also have compression stockings which she uses intermittently. Electronic Signature(s) Signed: 03/10/2015 3:24:16 PM By: Evlyn Kanner MD, FACS Entered By: Evlyn Kanner on 03/10/2015 15:24:16 Mickiewicz, Lelon Yu ([id number]) -------------------------------------------------------------------------------- Physical Exam Details Patient Name: [name] Date of Service: 03/10/2015 1:45 PM Medical Record Patient Account Number: [id number] Number: [name], RN, BSN, Treating RN: 1913-03-25 (81 y.o. [place] Yu Date of Birth/Sex: Female) Other Clinician: Primary Care Physician: PATIENT, NO Treating Tonnie Stillman Referring Physician: Marisue Ivan Physician/Extender: Weeks in Treatment: 1 Constitutional . Pulse regular. Respirations normal and unlabored. Afebrile. . Eyes Nonicteric. Reactive to light. Ears, Nose, Mouth, and Throat Lips, teeth, and gums WNL.Marland Kitchen Moist mucosa without lesions . Neck supple and nontender. No palpable supraclavicular or cervical adenopathy. Normal sized without goiter. Respiratory WNL. No retractions.. Cardiovascular Pedal Pulses WNL. No clubbing, cyanosis or edema. Musculoskeletal Adexa without tenderness or enlargement.. Digits and nails w/o clubbing, cyanosis, infection, petechiae, ischemia, or inflammatory conditions.. Integumentary (Hair, Skin) No suspicious lesions. No crepitus or fluctuance. No peri-wound warmth or erythema. No masses.Marland Kitchen Psychiatric Judgement and insight Intact.. No evidence of depression, anxiety, or agitation.. Notes some of the slough was quite deep and will need sharp debridement with a  curette. Electronic Signature(s) Signed: 03/10/2015 3:24:47 PM By: Evlyn Kanner MD, FACS Entered By: Evlyn Kanner on 03/10/2015 15:24:46 Valade, Lelon Yu ([id number]) -------------------------------------------------------------------------------- Physician Orders Details Patient Name: [name] Date of Service: 03/10/2015 1:45 PM Medical Record Patient Account Number: [id number] Number: [name], RN, BSN, Treating RN: February 07, 1913 (81 y.o.  Yu Date of Birth/Sex: Female) Other Clinician: Primary Care Physician: PATIENT, NO Treating Romulus Hanrahan Referring Physician: Marisue Ivan Physician/Extender: Tania Ade in Treatment: 1 Verbal / Phone Orders: Yes Clinician: [name], RN, BSN, Rita Read Back and Verified: Yes Diagnosis Coding Wound Cleansing Wound #1 Left,Anterior Lower Leg o Cleanse wound with mild soap and water o May Shower, gently pat wound dry prior to applying new dressing. o May shower with protection. Wound #2 Right,Anterior Lower Leg o Cleanse wound with mild soap and water o May  Shower, gently pat wound dry prior to applying new dressing. o May shower with protection. Anesthetic Wound #1 Left,Anterior Lower Leg o Topical Lidocaine 4% cream applied to wound bed prior to debridement Wound #2 Right,Anterior Lower Leg o Topical Lidocaine 4% cream applied to wound bed prior to debridement Skin Barriers/Peri-Wound Care Wound #1 Left,Anterior Lower Leg o Skin Prep Wound #2 Right,Anterior Lower Leg o Skin Prep Primary Wound Dressing Wound #1 Left,Anterior Lower Leg o Santyl Ointment Wound #2 Right,Anterior Lower Leg o Santyl Ointment Secondary Dressing Wound #1 Left,Anterior Lower Leg Shrestha, Ngozi L. ([id number]) o Gauze and Kerlix/Conform Wound #2 Right,Anterior Lower Leg o Gauze and Kerlix/Conform Dressing Change Frequency Wound #1 Left,Anterior Lower Leg o Change dressing every day. Wound #2 Right,Anterior Lower  Leg o Change dressing every day. Follow-up Appointments Wound #1 Left,Anterior Lower Leg o Return Appointment in 1 week. Wound #2 Right,Anterior Lower Leg o Return Appointment in 1 week. Edema Control Wound #1 Left,Anterior Lower Leg o Patient to wear own compression stockings Wound #2 Right,Anterior Lower Leg o Patient to wear own compression stockings Medications-please add to medication list. Wound #1 Left,Anterior Lower Leg o Santyl Enzymatic Ointment Wound #2 Right,Anterior Lower Leg o Santyl Enzymatic Ointment Electronic Signature(s) Signed: 03/10/2015 2:47:05 PM By: Elpidio Eric BSN, RN Signed: 03/10/2015 5:01:54 PM By: Evlyn Kanner MD, FACS Entered By: Elpidio Eric on 03/10/2015 14:47:05 Moncrief, Lelon Yu ([id number]) -------------------------------------------------------------------------------- Prescription 03/10/2015 Patient Name: [name] Physician: Evlyn Kanner MD Date of Birth: 09-02-1912 NPI#: [id number] Sex: F DEA#: [id number] Phone #: [phone] License #: Patient Address: [facility] [address] [place], [place] [address] [facility] [address], [address] [place], [place] [address] [phone] Allergies Celebrex Physician's Orders Santyl Enzymatic Ointment Signature(s): Date(s): Electronic Signature(s) Signed: 03/10/2015 5:01:54 PM By: Evlyn Kanner MD, FACS Signed: 03/10/2015 5:42:40 PM By: Elpidio Eric BSN, RN Entered By: Elpidio Eric on 03/10/2015 14:47:05 Gibas, Lelon Yu ([id number]) --------------------------------------------------------------------------------  Problem List Details Patient Name: [name] Date of Service: 03/10/2015 1:45 PM Medical Record Patient Account Number: [id number] Number: [name], RN, BSN, Treating RN: [birth year] (81 y.o. [place] Yu Date of Birth/Sex: Female) Other Clinician: Primary Care Physician: PATIENT, NO  Treating Chester Sibert Referring Physician: Marisue Ivan Physician/Extender: Weeks in Treatment: 1 Active Problems ICD-10 Encounter Code Description Active Date Diagnosis L97.212 Non-pressure chronic ulcer of right calf with fat layer 03/03/2015 Yes exposed L97.222 Non-pressure chronic ulcer of left calf with fat layer 03/03/2015 Yes exposed I89.0 Lymphedema, not elsewhere classified 03/03/2015 Yes Inactive Problems Resolved Problems Electronic Signature(s) Signed: 03/10/2015 3:23:41 PM By: Evlyn Kanner MD, FACS Entered By: Evlyn Kanner on 03/10/2015 15:23:41 Withrow, Lelon Yu ([id number]) -------------------------------------------------------------------------------- Progress Note Details Patient Name: [name] Date of Service: 03/10/2015 1:45 PM Medical Record Patient Account Number: [id number] Number: [name], RN, BSN, Treating RN: Aug 06, 1913 (81 y.o. [place] Yu Date of Birth/Sex: Female) Other Clinician: Primary Care Physician: PATIENT, NO Treating Sharian Delia Referring Physician: Marisue Ivan Physician/Extender: Weeks in Treatment: 1 Subjective Chief Complaint Information obtained from Patient Patient seen for complaints of Non-Healing Wound. pleasant 81 year old patient who had a fall and injured both lower extremities on 01/31/2015 History of Present Illness (HPI) The following HPI elements were documented for the patient's wound: Location: both lower extremities Quality: Patient reports experiencing a dull pain to affected area(s). Severity: Patient states wound (s) are getting better. Duration: Patient has had the wound for < 4 weeks prior to presenting for treatment Timing: Pain in  wound is Intermittent (comes and goes Context: The wound occurred when the patient had a fall and lacerated both lower extremities. Modifying Factors: Other treatment(s) tried include:she had sutures applied there for a while and when they were removed the wound  opened up. Associated Signs and Symptoms: Patient reports having difficulty standing for long periods. 81 year old female who fell and had an injury to her right forehead, right elbow and both shins on 01/31/2015. She went to the ER at [facility] and had been treated appropriately with sutures being placed to her right and left lower extremity. Also had a CT scan which showed no intracranial hemorrhage and only a large scalp hematoma on the right frontal region.she was put on Keflex for 5 days. she had also received Bactrim for 14 days.Since then she has been seen by her PCP and he had put her on Augmentin for 14 days and wound culture was obtained. culture reports were reviewed -- she grew an MRSA sensitive to tetracycline, Bactrim and vancomycin. Her past medical history significant for osteoporosis, hypertension, spinal stenosis, right hip replacement, appendectomy and abdomen hysterectomy for ovarian cancer. She has been seen in the past by the vascular surgery group at [facility] and she has an appointment to see them again. She is known to use lymphedema pumps while she has been in her assisted living home but has not been using them for the last month. She does also have compression stockings which she uses intermittently. Sevcik, Tinie L. ([id number]) Objective Constitutional Pulse regular. Respirations normal and unlabored. Afebrile. Vitals Time Taken: 2:11 PM, Height: 64 in, Weight: 147 lbs, BMI: 25.2, Temperature: 97.7 F, Pulse: 63 bpm, Respiratory Rate: 16 breaths/min, Blood Pressure: 147/60 mmHg. Eyes Nonicteric. Reactive to light. Ears, Nose, Mouth, and Throat Lips, teeth, and gums WNL.Marland Kitchen. Moist mucosa without lesions . Neck supple and nontender. No palpable supraclavicular or cervical adenopathy. Normal sized without goiter. Respiratory WNL. No retractions.. Cardiovascular Pedal Pulses WNL. No clubbing, cyanosis or edema. Musculoskeletal Adexa without  tenderness or enlargement.. Digits and nails w/o clubbing, cyanosis, infection, petechiae, ischemia, or inflammatory conditions.Marland Kitchen. Psychiatric Judgement and insight Intact.. No evidence of depression, anxiety, or agitation.. General Notes: some of the slough was quite deep and will need sharp debridement with a curette. Integumentary (Hair, Skin) No suspicious lesions. No crepitus or fluctuance. No peri-wound warmth or erythema. No masses.. Wound #1 status is Open. Original cause of wound was Trauma. The wound is located on the Left,Anterior Lower Leg. The wound measures 6.5cm length x 6cm width x 0.4cm depth; 30.631cm^2 area and 12.252cm^3 volume. The wound is limited to skin breakdown. There is no tunneling or undermining noted. There is a medium amount of serosanguineous drainage noted. The wound margin is distinct with the outline attached to the wound base. There is small (1-33%) pink, pale granulation within the wound bed. There is a large (67-100%) amount of necrotic tissue within the wound bed including Eschar and Adherent Slough. The periwound skin appearance exhibited: Localized Edema, Moist. The periwound skin appearance did not exhibit: Callus, Crepitus, Excoriation, Fluctuance, Friable, Induration, Rash, Scarring, Dry/Scaly, Maceration, Atrophie Blanche, Cyanosis, Ecchymosis, Hemosiderin Staining, Mottled, Pallor, Rubor, Erythema. Periwound temperature was noted as No Abnormality. Wound #2 status is Open. Original cause of wound was Trauma. The wound is located on the [facility], Harriett SineANCY L. ([id number]) Right,Anterior Lower Leg. The wound measures 3cm length x 1.5cm width x 0.2cm depth; 3.534cm^2 area and 0.707cm^3 volume. The wound is limited to skin breakdown. There is no tunneling or  undermining noted. There is a medium amount of serosanguineous drainage noted. The wound margin is distinct with the outline attached to the wound base. There is no granulation within the wound bed. There  is a large (67- 100%) amount of necrotic tissue within the wound bed including Adherent Slough. The periwound skin appearance exhibited: Localized Edema, Moist. The periwound skin appearance did not exhibit: Callus, Crepitus, Excoriation, Fluctuance, Friable, Induration, Rash, Scarring, Dry/Scaly, Maceration, Atrophie Blanche, Cyanosis, Ecchymosis, Hemosiderin Staining, Mottled, Pallor, Rubor, Erythema. Periwound temperature was noted as No Abnormality. The periwound has tenderness on palpation. 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 She is encouraged to walk slowly with a walker but not cold long distances. She will come back and see as next week. Procedures Wound #1 Wound #1 is a Skin Tear located on the Left,Anterior Lower Leg . There was a Skin/Subcutaneous Tissue Debridement ([id number]) debridement with total area of 39 sq cm performed by Tristan Schroeder., MD. with the following instrument(s): Curette to remove Viable and Non-Viable tissue/material including Exudate, Fibrin/Slough, and Subcutaneous after achieving pain control using Lidocaine 4% Topical Solution. A time out was conducted prior to the start of the procedure. A Minimum amount of bleeding was controlled with Pressure. The procedure was tolerated well with a pain level of 0 throughout and a pain level of 0 following the procedure. Post Debridement Measurements: 6.5cm length x 6cm width x 0.4cm depth; 12.252cm^3 volume. Wound #2 Wound #2 is a Skin Tear located on the Right,Anterior Lower Leg . There was a Skin/Subcutaneous Tissue Debridement ([id number]) debridement with total area of 4.5 sq cm performed by Laria Grimmett, Ignacia Felling., MD. with the following  instrument(s): Curette to remove Viable and Non-Viable tissue/material including Exudate, Fibrin/Slough, and Subcutaneous after achieving pain control using Lidocaine 4% Topical Solution. A time Soots, Alsha L. ([id number]) out was conducted prior to the start of the procedure. A Minimum amount of bleeding was controlled with Pressure. The procedure was tolerated well with a pain level of 0 throughout and a pain level of 0 following the procedure. Post Debridement Measurements: 3cm length x 1.5cm width x 0.3cm depth; 1.06cm^3 volume. Plan Wound Cleansing: Wound #1 Left,Anterior Lower Leg: Cleanse wound with mild soap and water May Shower, gently pat wound dry prior to applying new dressing. May shower with protection. Wound #2 Right,Anterior Lower Leg: Cleanse wound with mild soap and water May Shower, gently pat wound dry prior to applying new dressing. May shower with protection. Anesthetic: Wound #1 Left,Anterior Lower Leg: Topical Lidocaine 4% cream applied to wound bed prior to debridement Wound #2 Right,Anterior Lower Leg: Topical Lidocaine 4% cream applied to wound bed prior to debridement Skin Barriers/Peri-Wound Care: Wound #1 Left,Anterior Lower Leg: Skin Prep Wound #2 Right,Anterior Lower Leg: Skin Prep Primary Wound Dressing: Wound #1 Left,Anterior Lower Leg: Santyl Ointment Wound #2 Right,Anterior Lower Leg: Santyl Ointment Secondary Dressing: Wound #1 Left,Anterior Lower Leg: Gauze and Kerlix/Conform Wound #2 Right,Anterior Lower Leg: Gauze and Kerlix/Conform Dressing Change Frequency: Wound #1 Left,Anterior Lower Leg: Change dressing every day. Wound #2 Right,Anterior Lower Leg: Change dressing every day. Follow-up Appointments: Wound #1 Left,Anterior Lower Leg: Return Appointment in 1 week. Wound #2 Right,Anterior Lower Leg: Return Appointment in 1 week. Schlemmer, Teresa L. ([id number]) Edema Control: Wound #1  Left,Anterior Lower Leg: Patient to wear own  compression stockings Wound #2 Right,Anterior Lower Leg: Patient to wear own compression stockings Medications-please add to medication list.: Wound #1 Left,Anterior Lower Leg: Santyl Enzymatic Ointment Wound #2 Right,Anterior Lower Leg: Santyl Enzymatic Ointment We will continue with the Santyl ointment locally and an appropriate dressing over this. She is encouraged to walk slowly with a walker but not cold long distances. She will come back and see as next week. Electronic Signature(s) Signed: 03/10/2015 3:25:57 PM By: Evlyn Kanner MD, FACS Entered By: Evlyn Kanner on 03/10/2015 15:25:57 Kue, Lelon Yu ([id number]) -------------------------------------------------------------------------------- SuperBill Details Patient Name: [name] Date of Service: 03/10/2015 Medical Record Patient Account Number: [id number] Number: [name], RN, BSN, Treating RN: 12-Jul-1913 (81 y.o. [place] Yu Date of Birth/Sex: Female) Other Clinician: Primary Care Physician: PATIENT, NO Treating Donye Dauenhauer Referring Physician: Marisue Ivan Physician/Extender: Weeks in Treatment: 1 Diagnosis Coding ICD-10 Codes Code Description [phone] Non-pressure chronic ulcer of right calf with fat layer exposed L97.222 Non-pressure chronic ulcer of left calf with fat layer exposed I89.0 Lymphedema, not elsewhere classified Facility Procedures CPT4 Code: 62952841 Description: 11042 - DEB SUBQ TISSUE 20 SQ CM/< ICD-10 Description Diagnosis L97.212 Non-pressure chronic ulcer of right calf with fat L97.222 Non-pressure chronic ulcer of left calf with fat l I89.0 Lymphedema, not elsewhere classified Modifier: layer exposed ayer exposed Quantity: 1 CPT4 Code: [id number] Description: 11045 - DEB SUBQ TISS EA ADDL 20CM ICD-10 Description Diagnosis L97.212 Non-pressure chronic ulcer of right calf with fat L97.222 Non-pressure chronic ulcer of left calf with fat l I89.0 Lymphedema, not elsewhere  classified Modifier: layer exposed ayer exposed Quantity: 2 Physician Procedures CPT4 Code: [id number] Description: 11042 - WC PHYS SUBQ TISS 20 SQ CM ICD-10 Description Diagnosis L97.212 Non-pressure chronic ulcer of right calf with fat l L97.222 Non-pressure chronic ulcer of left calf with fat la I89.0 Lymphedema, not elsewhere classified Modifier: ayer exposed yer exposed Quantity: 1 CPT4 Code: [id number] Toro, Hisae Description: 11045 - WC PHYS SUBQ TISS EA ADDL 20 CM L. ([id number]) Modifier: Quantity: 2 Electronic Signature(s) Signed: 03/10/2015 3:26:17 PM By: Evlyn Kanner MD, FACS Entered By: Evlyn Kanner on 03/10/2015 15:26:17

## 2015-03-17 ENCOUNTER — Encounter: Payer: Medicare Other | Admitting: Surgery

## 2015-03-17 DIAGNOSIS — L97212 Non-pressure chronic ulcer of right calf with fat layer exposed: Secondary | ICD-10-CM | POA: Diagnosis not present

## 2015-03-17 NOTE — Progress Notes (Addendum)
[name], [name] ([id number]) Visit Report for 03/17/2015 Chief Complaint Document Details Patient Name: [name], [name] 03/17/2015 1:00 Date of Service: PM Medical Record [id number] Number: Patient Account Number: [id number] December 10, 1912 (81 y.o. Treating RN: Date of Birth/Sex: Female) Other Clinician: Primary Care Physician: PATIENT, NO Treating Kahmari Koller Referring Physician: Marisue Ivan Physician/Extender: Weeks in Treatment: 2 Information Obtained from: Patient Chief Complaint Patient seen for complaints of Non-Healing Wound. pleasant 81 year old patient who had a fall and injured both lower extremities on 01/31/2015 Electronic Signature(s) Signed: 03/17/2015 1:48:48 PM By: Evlyn Kanner MD, FACS Entered By: Evlyn Kanner on 03/17/2015 13:48:48 Kauth, [name] ([id number]) -------------------------------------------------------------------------------- Debridement Details Patient Name: Leah Deed L. 03/17/2015 1:00 Date of Service: PM Medical Record [id number] Number: Patient Account Number: [id number] 1912-10-04 (81 y.o. Treating RN: Date of Birth/Sex: Female) Other Clinician: Primary Care Physician: PATIENT, NO Treating Fahd Galea Referring Physician: Marisue Ivan Physician/Extender: Weeks in Treatment: 2 Debridement Performed for Wound #2 Right,Anterior Lower Leg Assessment: Performed By: Physician Tristan Schroeder., MD Debridement: Debridement Pre-procedure Yes Verification/Time Out Taken: Start Time: 13:37 Pain Control: Lidocaine 4% Topical Solution Level: Skin/Subcutaneous Tissue Total Area Debrided (L x 2.5 (cm) x 1.2 (cm) = 3 (cm) W): Tissue and other Viable, Non-Viable, Fibrin/Slough, Subcutaneous material debrided: Instrument: Curette Bleeding: None End Time: 13:38 Procedural Pain: 0 Post Procedural Pain: 0 Response to Treatment: Procedure was tolerated well Post Debridement Measurements of Total Wound Length: (cm)  2.5 Width: (cm) 1.2 Depth: (cm) 0.2 Volume: (cm) 0.471 Electronic Signature(s) Signed: 03/17/2015 1:48:16 PM By: Evlyn Kanner MD, FACS Entered By: Evlyn Kanner on 03/17/2015 13:48:16 Pralle, [name] ([id number]) -------------------------------------------------------------------------------- Debridement Details Patient Name: Leah Deed L. 03/17/2015 1:00 Date of Service: PM Medical Record [id number] Number: Patient Account Number: [id number] 1913/08/13 (81 y.o. Treating RN: Date of Birth/Sex: Female) Other Clinician: Primary Care Physician: PATIENT, NO Treating Lamiracle Chaidez Referring Physician: Marisue Ivan Physician/Extender: Weeks in Treatment: 2 Debridement Performed for Wound #3 Left,Midline,Anterior Lower Leg Assessment: Performed By: Physician Tristan Schroeder., MD Debridement: Debridement Pre-procedure Yes Verification/Time Out Taken: Start Time: 13:38 Pain Control: Lidocaine 4% Topical Solution Level: Skin/Subcutaneous Tissue Total Area Debrided (L x 3 (cm) x 2 (cm) = 6 (cm) W): Tissue and other Viable, Non-Viable, Fibrin/Slough, Subcutaneous material debrided: Instrument: Curette Bleeding: Minimum Hemostasis Achieved: Pressure End Time: 13:43 Procedural Pain: 0 Post Procedural Pain: 0 Response to Treatment: Procedure was tolerated well Post Debridement Measurements of Total Wound Length: (cm) 3 Width: (cm) 2 Depth: (cm) 0.1 Volume: (cm) 0.471 Electronic Signature(s) Signed: 03/17/2015 1:48:38 PM By: Evlyn Kanner MD, FACS Entered By: Evlyn Kanner on 03/17/2015 13:48:38 Hopman, [name] ([id number]) -------------------------------------------------------------------------------- HPI Details Patient Name: [name]. 03/17/2015 1:00 Date of Service: PM Medical Record [id number] Number: Patient Account Number: [id number] 1913/02/08 (81 y.o. Treating RN: Date of Birth/Sex: Female) Other Clinician: Primary Care Physician: PATIENT,  NO Treating Ethyle Tiedt Referring Physician: Marisue Ivan Physician/Extender: Weeks in Treatment: 2 History of Present Illness Location: both lower extremities Quality: Patient reports experiencing a dull pain to affected area(s). Severity: Patient states wound (s) are getting better. Duration: Patient has had the wound for < 4 weeks prior to presenting for treatment Timing: Pain in wound is Intermittent (comes and goes Context: The wound occurred when the patient had a fall and lacerated both lower extremities. Modifying Factors: Other treatment(s) tried include:she had sutures applied there for a while and when they were removed the wound opened up. Associated Signs and Symptoms: Patient reports having difficulty standing for long periods.  HPI Description: 81 year old female who fell and had an injury to her right forehead, right elbow and both shins on 01/31/2015. She went to the ER at [facility] and had been treated appropriately with sutures being placed to her right and left lower extremity. Also had a CT scan which showed no intracranial hemorrhage and only a large scalp hematoma on the right frontal region.she was put on Keflex for 5 days. she had also received Bactrim for 14 days.Since then she has been seen by her PCP and he had put her on Augmentin for 14 days and wound culture was obtained. culture reports were reviewed -- she grew an MRSA sensitive to tetracycline, Bactrim and vancomycin. Her past medical history significant for osteoporosis, hypertension, spinal stenosis, right hip replacement, appendectomy and abdomen hysterectomy for ovarian cancer. She has been seen in the past by the vascular surgery group at [facility] and she has an appointment to see them again. She is known to use lymphedema pumps while she has been in her assisted living home but has not been using them for the last month. She does also have compression stockings which she uses  intermittently. 03/17/2015 -- She was seen by Dr. Gilda Crease on 03/03/2015 -- he reviewed the patientos case and recommended no surgical intervention. He has recommended compression stockings of the 20-30 mmHg variety and also recommended lymphedema pumps. He will see her back in 3 months. The patient has developed some blisters on both lower extremities and this may be due to the fact that she's had a Kerlix gauze wrap on both lower extremities and is also using the lymphedema pumps. Electronic Signature(s) Signed: 03/17/2015 1:49:38 PM By: Evlyn Kanner MD, FACS Previous Signature: 03/17/2015 1:48:57 PM Version By: Evlyn Kanner MD, FACS Previous Signature: 03/17/2015 1:18:58 PM Version By: Evlyn Kanner MD, FACS Entered By: Evlyn Kanner on 03/17/2015 13:49:38 Willadsen, [name] ([id number]) MELBA, ARAKI ([id number]) -------------------------------------------------------------------------------- Physical Exam Details Patient Name: LAURISA, SAHAKIAN 03/17/2015 1:00 Date of Service: PM Medical Record [id number] Number: Patient Account Number: [id number] 27-Dec-1912 (81 y.o. Treating RN: Date of Birth/Sex: Female) Other Clinician: Primary Care Physician: PATIENT, NO Treating Else Habermann Referring Physician: Marisue Ivan Physician/Extender: Weeks in Treatment: 2 Constitutional . Pulse regular. Respirations normal and unlabored. Afebrile. . Eyes Nonicteric. Reactive to light. Ears, Nose, Mouth, and Throat Lips, teeth, and gums WNL.Marland Kitchen Moist mucosa without lesions . Neck supple and nontender. No palpable supraclavicular or cervical adenopathy. Normal sized without goiter. Respiratory WNL. No retractions.. Cardiovascular Heart rhythm and rate regular, no murmur or gallop.. Pedal Pulses WNL. she has +2 pitting edema both lower extremities and has developed some blisters on both lower extremities some of them still filled with serum.Marland Kitchen Lymphatic No adneopathy. No adenopathy. No  adenopathy. Musculoskeletal Adexa without tenderness or enlargement.. Digits and nails w/o clubbing, cyanosis, infection, petechiae, ischemia, or inflammatory conditions.. Integumentary (Hair, Skin) No suspicious lesions. No crepitus or fluctuance. No peri-wound warmth or erythema. No masses.Marland Kitchen Psychiatric Judgement and insight Intact.. No evidence of depression, anxiety, or agitation.. Notes Besides the blisters the wound need sharp debridement with a curette as it has significant amount of slough. Electronic Signature(s) Signed: 03/17/2015 1:50:40 PM By: Evlyn Kanner MD, FACS Entered By: Evlyn Kanner on 03/17/2015 13:50:39 Vanderslice, [name] ([id number]) -------------------------------------------------------------------------------- Physician Orders Details Patient Name: KALIOPE, QUINONEZ 03/17/2015 1:00 Date of Service: PM Medical Record [id number] Number: Patient Account Number: [id number] 08/27/1913 (81 y.o. Treating RN: Curtis Sites Date of Birth/Sex: Female) Other Clinician: Primary Care Physician: PATIENT,  NO Treating Aurel Nguyen Referring Physician: Marisue Ivan Physician/Extender: Tania Ade in Treatment: 2 Verbal / Phone Orders: Yes Clinician: Curtis Sites Read Back and Verified: Yes Diagnosis Coding Wound Cleansing Wound #1 Left,Anterior Lower Leg o Cleanse wound with mild soap and water o May Shower, gently pat wound dry prior to applying new dressing. o May shower with protection. Wound #2 Right,Anterior Lower Leg o Cleanse wound with mild soap and water o May Shower, gently pat wound dry prior to applying new dressing. o May shower with protection. Wound #3 Left,Midline,Anterior Lower Leg o Cleanse wound with mild soap and water o May Shower, gently pat wound dry prior to applying new dressing. o May shower with protection. Wound #4 Right,Midline,Anterior Lower Leg o Cleanse wound with mild soap and water o May Shower, gently  pat wound dry prior to applying new dressing. o May shower with protection. Anesthetic Wound #1 Left,Anterior Lower Leg o Topical Lidocaine 4% cream applied to wound bed prior to debridement Wound #2 Right,Anterior Lower Leg o Topical Lidocaine 4% cream applied to wound bed prior to debridement Wound #3 Left,Midline,Anterior Lower Leg o Topical Lidocaine 4% cream applied to wound bed prior to debridement Wound #4 Right,Midline,Anterior Lower Leg o Topical Lidocaine 4% cream applied to wound bed prior to debridement Skin Barriers/Peri-Wound Care Demonte, Dorisann L. ([id number]) Wound #1 Left,Anterior Lower Leg o Skin Prep Wound #2 Right,Anterior Lower Leg o Skin Prep Wound #3 Left,Midline,Anterior Lower Leg o Skin Prep Wound #4 Right,Midline,Anterior Lower Leg o Skin Prep Primary Wound Dressing Wound #1 Left,Anterior Lower Leg o Santyl Ointment Wound #2 Right,Anterior Lower Leg o Santyl Ointment Wound #3 Left,Midline,Anterior Lower Leg o Xeroform Wound #4 Right,Midline,Anterior Lower Leg o Xeroform Secondary Dressing Wound #1 Left,Anterior Lower Leg o Gauze and Kerlix/Conform - conform please Wound #2 Right,Anterior Lower Leg o Gauze and Kerlix/Conform - conform please Wound #3 Left,Midline,Anterior Lower Leg o Gauze and Kerlix/Conform - conform please Wound #4 Right,Midline,Anterior Lower Leg o Gauze and Kerlix/Conform - conform please Dressing Change Frequency Wound #1 Left,Anterior Lower Leg o Change dressing every day. Wound #2 Right,Anterior Lower Leg o Change dressing every day. Wound #3 Left,Midline,Anterior Lower Leg o Change dressing every day. Serres, Lashunda L. ([id number]) Wound #4 Right,Midline,Anterior Lower Leg o Change dressing every day. Follow-up Appointments Wound #1 Left,Anterior Lower Leg o Return Appointment in 1 week. Wound #2 Right,Anterior Lower Leg o Return Appointment in 1 week. Wound #3  Left,Midline,Anterior Lower Leg o Return Appointment in 1 week. Wound #4 Right,Midline,Anterior Lower Leg o Return Appointment in 1 week. Edema Control Wound #1 Left,Anterior Lower Leg o Patient to wear own compression stockings Wound #2 Right,Anterior Lower Leg o Patient to wear own compression stockings Wound #3 Left,Midline,Anterior Lower Leg o Patient to wear own compression stockings Wound #4 Right,Midline,Anterior Lower Leg o Patient to wear own compression stockings Home Health Wound #1 Left,Anterior Lower Leg o Continue Home Health Visits o Home Health Nurse may visit PRN to address patientos wound care needs. o FACE TO FACE ENCOUNTER: MEDICARE and MEDICAID PATIENTS: I certify that this patient is under my care and that I had a face-to-face encounter that meets the physician face-to-face encounter requirements with this patient on this date. The encounter with the patient was in whole or in part for the following MEDICAL CONDITION: (primary reason for Home Healthcare) MEDICAL NECESSITY: I certify, that based on my findings, NURSING services are a medically necessary home health service. HOME BOUND STATUS: I certify that my clinical findings support that this  patient is homebound (i.e., Due to illness or injury, pt requires aid of supportive devices such as crutches, cane, wheelchairs, walkers, the use of special transportation or the assistance of another person to leave their place of residence. There is a normal inability to leave the home and doing so requires considerable and taxing effort. Other absences are for medical reasons / religious services and are infrequent or of short duration when for other reasons). Cuoco, Izola L. ([id number]) o If current dressing causes regression in wound condition, may D/C ordered dressing product/s and apply Normal Saline Moist Dressing daily until next Wound Healing Center / Other MD appointment. Notify Wound  Healing Center of regression in wound condition at [phone]. o Please direct any NON-WOUND related issues/requests for orders to patient's Primary Care Physician Wound #2 Right,Anterior Lower Leg o Continue Home Health Visits o Home Health Nurse may visit PRN to address patientos wound care needs. o FACE TO FACE ENCOUNTER: MEDICARE and MEDICAID PATIENTS: I certify that this patient is under my care and that I had a face-to-face encounter that meets the physician face-to-face encounter requirements with this patient on this date. The encounter with the patient was in whole or in part for the following MEDICAL CONDITION: (primary reason for Home Healthcare) MEDICAL NECESSITY: I certify, that based on my findings, NURSING services are a medically necessary home health service. HOME BOUND STATUS: I certify that my clinical findings support that this patient is homebound (i.e., Due to illness or injury, pt requires aid of supportive devices such as crutches, cane, wheelchairs, walkers, the use of special transportation or the assistance of another person to leave their place of residence. There is a normal inability to leave the home and doing so requires considerable and taxing effort. Other absences are for medical reasons / religious services and are infrequent or of short duration when for other reasons). o If current dressing causes regression in wound condition, may D/C ordered dressing product/s and apply Normal Saline Moist Dressing daily until next Wound Healing Center / Other MD appointment. Notify Wound Healing Center of regression in wound condition at [phone]. o Please direct any NON-WOUND related issues/requests for orders to patient's Primary Care Physician Wound #3 Left,Midline,Anterior Lower Leg o Continue Home Health Visits o Home Health Nurse may visit PRN to address patientos wound care needs. o FACE TO FACE ENCOUNTER: MEDICARE and MEDICAID  PATIENTS: I certify that this patient is under my care and that I had a face-to-face encounter that meets the physician face-to-face encounter requirements with this patient on this date. The encounter with the patient was in whole or in part for the following MEDICAL CONDITION: (primary reason for Home Healthcare) MEDICAL NECESSITY: I certify, that based on my findings, NURSING services are a medically necessary home health service. HOME BOUND STATUS: I certify that my clinical findings support that this patient is homebound (i.e., Due to illness or injury, pt requires aid of supportive devices such as crutches, cane, wheelchairs, walkers, the use of special transportation or the assistance of another person to leave their place of residence. There is a normal inability to leave the home and doing so requires considerable and taxing effort. Other absences are for medical reasons / religious services and are infrequent or of short duration when for other reasons). o If current dressing causes regression in wound condition, may D/C ordered dressing product/s and apply Normal Saline Moist Dressing daily until next Wound Healing Center / Other MD appointment. Notify Wound Healing Center of  regression in wound condition at [phone]. o Please direct any NON-WOUND related issues/requests for orders to patient's Primary Care Physician Wound #4 Right,Midline,Anterior Lower Leg o Continue Home Health Visits SABRENA, GAVITT ([id number]) o Home Health Nurse may visit PRN to address patientos wound care needs. o FACE TO FACE ENCOUNTER: MEDICARE and MEDICAID PATIENTS: I certify that this patient is under my care and that I had a face-to-face encounter that meets the physician face-to-face encounter requirements with this patient on this date. The encounter with the patient was in whole or in part for the following MEDICAL CONDITION: (primary reason for Home Healthcare) MEDICAL NECESSITY: I  certify, that based on my findings, NURSING services are a medically necessary home health service. HOME BOUND STATUS: I certify that my clinical findings support that this patient is homebound (i.e., Due to illness or injury, pt requires aid of supportive devices such as crutches, cane, wheelchairs, walkers, the use of special transportation or the assistance of another person to leave their place of residence. There is a normal inability to leave the home and doing so requires considerable and taxing effort. Other absences are for medical reasons / religious services and are infrequent or of short duration when for other reasons). o If current dressing causes regression in wound condition, may D/C ordered dressing product/s and apply Normal Saline Moist Dressing daily until next Wound Healing Center / Other MD appointment. Notify Wound Healing Center of regression in wound condition at [phone]. o Please direct any NON-WOUND related issues/requests for orders to patient's Primary Care Physician Medications-please add to medication list. Wound #1 Left,Anterior Lower Leg o Santyl Enzymatic Ointment Wound #2 Right,Anterior Lower Leg o Santyl Enzymatic Ointment Electronic Signature(s) Signed: 03/17/2015 4:15:37 PM By: Evlyn Kanner MD, FACS Signed: 03/17/2015 4:33:06 PM By: Curtis Sites Entered By: Curtis Sites on 03/17/2015 13:45:48 Reels, [name] ([id number]) -------------------------------------------------------------------------------- Prescription 03/17/2015 Patient Name: [name] Physician: Evlyn Kanner MD Date of Birth: [birth year] NPI#: [id number] Sex: F DEA#: [id number] Phone #: [phone] License #: Patient Address: [facility] [address] [place], [place] [address] [facility] [address], [address] [place], [place] [address] [phone] Allergies Celebrex Physician's  Orders Santyl Enzymatic Ointment Signature(s): Date(s): Electronic Signature(s) Signed: 03/17/2015 4:15:37 PM By: Evlyn Kanner MD, FACS Signed: 03/17/2015 4:33:06 PM By: Curtis Sites Entered By: Curtis Sites on 03/17/2015 13:45:49 Craze, [name] ([id number]) --------------------------------------------------------------------------------  Problem List Details Patient Name: MISTEY, HOFFERT 03/17/2015 1:00 Date of Service: PM Medical Record [id number] Number: Patient Account Number: [id number] Oct 30, 1912 (81 y.o. 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 03/17/2015 1:00 Date of Service: PM Medical Record [id number] Number: Patient Account Number: [id number] [birth year] (81 y.o. Treating RN: Date of Birth/Sex: Female) Other Clinician: Primary Care Physician: PATIENT, NO Treating Laree Garron Referring Physician: Marisue Ivan Physician/Extender: Weeks in Treatment: 2 Subjective Chief Complaint Information obtained from Patient Patient seen for complaints of Non-Healing Wound.  pleasant 81 year old patient who had a fall and injured both lower extremities on 01/31/2015 History of Present  Illness (HPI) The following HPI elements were documented for the patient's wound: Location: both lower extremities Quality: Patient reports experiencing a dull pain to affected area(s). Severity: Patient states wound (s) are getting better. Duration: Patient has had the wound for < 4 weeks prior to presenting for treatment Timing: Pain in wound is Intermittent (comes and goes Context: The wound occurred when the patient had a fall and lacerated both lower extremities. Modifying Factors: Other treatment(s) tried include:she had sutures applied there for a while and when they were removed the wound opened up. Associated Signs and Symptoms: Patient reports having difficulty standing for long periods. 81 year old female who fell and had an injury to her right forehead, right elbow and both shins on 01/31/2015. She went to the ER at [facility] and had been treated appropriately with sutures being placed to her right and left lower extremity. Also had a CT scan which showed no intracranial hemorrhage and only a large scalp hematoma on the right frontal region.she was put on Keflex for 5 days. she had also received Bactrim for 14 days.Since then she has been seen by her PCP and he had put her on Augmentin for 14 days and wound culture was obtained. culture reports were reviewed -- she grew an MRSA sensitive to tetracycline, Bactrim and vancomycin. Her past medical history significant for osteoporosis, hypertension, spinal stenosis, right hip replacement, appendectomy and abdomen hysterectomy for ovarian cancer. She has been seen in the past by the vascular surgery group at [facility] and she has an appointment to see them again. She is known to use lymphedema pumps while she has been in her assisted living home but has not been using them for the last month. She does also have compression stockings which she uses intermittently. 03/17/2015 -- She was seen by Dr. Gilda Crease on 03/03/2015  -- he reviewed the patient s case and recommended no surgical intervention. He has recommended compression stockings of the 20-30 mmHg variety and also recommended lymphedema pumps. He will see her back in 3 months. The patient has developed some blisters on both lower extremities and this may be due to the fact that Steidle, Ople L. ([id number]) she's had a Kerlix gauze wrap on both lower extremities and is also using the lymphedema pumps. Objective Constitutional Pulse regular. Respirations normal and unlabored. Afebrile. Vitals Time Taken: 1:12 PM, Height: 64 in, Weight: 147 lbs, BMI: 25.2, Temperature: 97.8 F, Pulse: 67 bpm, Respiratory Rate: 16 breaths/min, Blood Pressure: 141/57 mmHg. Eyes Nonicteric. Reactive to light. Ears, Nose, Mouth, and Throat Lips, teeth, and gums WNL.Marland Kitchen Moist mucosa without lesions . Neck supple and nontender. No palpable supraclavicular or cervical adenopathy. Normal sized without goiter. Respiratory WNL. No retractions.. Cardiovascular Heart rhythm and rate regular, no murmur or gallop.. Pedal Pulses WNL. she has +2 pitting edema both lower extremities and has developed some blisters on both lower extremities some of them still filled with serum.Marland Kitchen Lymphatic No adneopathy. No adenopathy. No adenopathy. Musculoskeletal Adexa without tenderness or enlargement.. Digits and nails w/o clubbing, cyanosis, infection, petechiae, ischemia, or inflammatory conditions.Marland Kitchen Psychiatric Judgement and insight Intact.. No evidence of depression, anxiety, or agitation.. General Notes: Besides the blisters the wound need sharp debridement with a curette as it has significant amount of slough. Integumentary (Hair, Skin) No suspicious lesions. No crepitus or fluctuance. No peri-wound warmth or erythema. No masses.Marland Kitchen Wurtzel, Nilsa L. ([id number]) Wound #1 status  is Open. Original cause of wound was Trauma. The wound is located on the Left,Anterior Lower Leg. The wound  measures 6cm length x 5cm width x 0.4cm depth; 23.562cm^2 area and 9.425cm^3 volume. The wound is limited to skin breakdown. There is a medium amount of serosanguineous drainage noted. The wound margin is distinct with the outline attached to the wound base. There is small (1-33%) pink, pale granulation within the wound bed. There is a large (67-100%) amount of necrotic tissue within the wound bed including Eschar and Adherent Slough. The periwound skin appearance exhibited: Localized Edema, Moist. The periwound skin appearance did not exhibit: Callus, Crepitus, Excoriation, Fluctuance, Friable, Induration, Rash, Scarring, Dry/Scaly, Maceration, Atrophie Blanche, Cyanosis, Ecchymosis, Hemosiderin Staining, Mottled, Pallor, Rubor, Erythema. Periwound temperature was noted as No Abnormality. Wound #2 status is Open. Original cause of wound was Trauma. The wound is located on the Right,Anterior Lower Leg. The wound measures 2.5cm length x 1.2cm width x 0.2cm depth; 2.356cm^2 area and 0.471cm^3 volume. The wound is limited to skin breakdown. There is a medium amount of serosanguineous drainage noted. The wound margin is distinct with the outline attached to the wound base. There is no granulation within the wound bed. There is a large (67-100%) amount of necrotic tissue within the wound bed including Adherent Slough. The periwound skin appearance exhibited: Localized Edema, Moist. The periwound skin appearance did not exhibit: Callus, Crepitus, Excoriation, Fluctuance, Friable, Induration, Rash, Scarring, Dry/Scaly, Maceration, Atrophie Blanche, Cyanosis, Ecchymosis, Hemosiderin Staining, Mottled, Pallor, Rubor, Erythema. Periwound temperature was noted as No Abnormality. The periwound has tenderness on palpation. Wound #3 status is Open. Original cause of wound was Blister. The wound is located on the Left,Midline,Anterior Lower Leg. The wound measures 3cm length x 2cm width x 0.1cm depth;  4.712cm^2 area and 0.471cm^3 volume. The wound is limited to skin breakdown. The wound margin is indistinct and nonvisible. The periwound skin appearance did not exhibit: Callus, Crepitus, Excoriation, Fluctuance, Friable, Induration, Localized Edema, Rash, Scarring, Dry/Scaly, Maceration, Moist, Atrophie Blanche, Cyanosis, Ecchymosis, Hemosiderin Staining, Mottled, Pallor, Rubor, Erythema. Wound #4 status is Open. Original cause of wound was Blister. The wound is located on the Right,Midline,Anterior Lower Leg. The wound measures 8cm length x 3cm width x 0.1cm depth; 18.85cm^2 area and 1.885cm^3 volume. The wound is limited to skin breakdown. The wound margin is indistinct and nonvisible. The periwound skin appearance did not exhibit: Callus, Crepitus, Excoriation, Fluctuance, Friable, Induration, Localized Edema, Rash, Scarring, Dry/Scaly, Maceration, Moist, Atrophie Blanche, Cyanosis, Ecchymosis, Hemosiderin Staining, Mottled, Pallor, Rubor, Erythema. Assessment Active Problems ICD-10 L97.212 - Non-pressure chronic ulcer of right calf with fat layer exposed L97.222 - Non-pressure chronic ulcer of left calf with fat layer exposed I89.0 - Lymphedema, not elsewhere classified Kettles, Frankye L. ([id number]) I have recommended we use some Xeroform gauze over the blisters and use Santyl on the ulcerated areas. She should have a very light wrap placed and she can use the lymphedema pumps though with some caution. She will come back and see as next week. Procedures Wound #2 Wound #2 is a Skin Tear located on the Right,Anterior Lower Leg . There was a Skin/Subcutaneous Tissue Debridement ([id number]) debridement with total area of 3 sq cm performed by Toren Tucholski, Ignacia Felling., MD. with the following instrument(s): Curette to remove Viable and Non-Viable tissue/material including Fibrin/Slough and Subcutaneous after achieving pain control using Lidocaine 4% Topical Solution. A time out was conducted prior  to the start of the procedure. There was no bleeding. The procedure was tolerated well with  a pain level of 0 throughout and a pain level of 0 following the procedure. Post Debridement Measurements: 2.5cm length x 1.2cm width x 0.2cm depth; 0.471cm^3 volume. Wound #3 Wound #3 is a Lymphedema located on the Left,Midline,Anterior Lower Leg . There was a Skin/Subcutaneous Tissue Debridement ([id number]) debridement with total area of 6 sq cm performed by Harpreet Pompey, Ignacia Felling., MD. with the following instrument(s): Curette to remove Viable and Non-Viable tissue/material including Fibrin/Slough and Subcutaneous after achieving pain control using Lidocaine 4% Topical Solution. A time out was conducted prior to the start of the procedure. A Minimum amount of bleeding was controlled with Pressure. The procedure was tolerated well with a pain level of 0 throughout and a pain level of 0 following the procedure. Post Debridement Measurements: 3cm length x 2cm width x 0.1cm depth; 0.471cm^3 volume. Plan Wound Cleansing: Wound #1 Left,Anterior Lower Leg: Cleanse wound with mild soap and water May Shower, gently pat wound dry prior to applying new dressing. May shower with protection. Wound #2 Right,Anterior Lower Leg: Cleanse wound with mild soap and water May Shower, gently pat wound dry prior to applying new dressing. May shower with protection. Wound #3 Left,Midline,Anterior Lower Leg: Cleanse wound with mild soap and water May Shower, gently pat wound dry prior to applying new dressing. May shower with protection. Wound #4 Right,Midline,Anterior Lower Leg: Cleanse wound with mild soap and water Kolker, Latese L. ([id number]) May Shower, gently pat wound dry prior to applying new dressing. May shower with protection. Anesthetic: Wound #1 Left,Anterior Lower Leg: Topical Lidocaine 4% cream applied to wound bed prior to debridement Wound #2 Right,Anterior Lower Leg: Topical Lidocaine 4% cream  applied to wound bed prior to debridement Wound #3 Left,Midline,Anterior Lower Leg: Topical Lidocaine 4% cream applied to wound bed prior to debridement Wound #4 Right,Midline,Anterior Lower Leg: Topical Lidocaine 4% cream applied to wound bed prior to debridement Skin Barriers/Peri-Wound Care: Wound #1 Left,Anterior Lower Leg: Skin Prep Wound #2 Right,Anterior Lower Leg: Skin Prep Wound #3 Left,Midline,Anterior Lower Leg: Skin Prep Wound #4 Right,Midline,Anterior Lower Leg: Skin Prep Primary Wound Dressing: Wound #1 Left,Anterior Lower Leg: Santyl Ointment Wound #2 Right,Anterior Lower Leg: Santyl Ointment Wound #3 Left,Midline,Anterior Lower Leg: Xeroform Wound #4 Right,Midline,Anterior Lower Leg: Xeroform Secondary Dressing: Wound #1 Left,Anterior Lower Leg: Gauze and Kerlix/Conform - conform please Wound #2 Right,Anterior Lower Leg: Gauze and Kerlix/Conform - conform please Wound #3 Left,Midline,Anterior Lower Leg: Gauze and Kerlix/Conform - conform please Wound #4 Right,Midline,Anterior Lower Leg: Gauze and Kerlix/Conform - conform please Dressing Change Frequency: Wound #1 Left,Anterior Lower Leg: Change dressing every day. Wound #2 Right,Anterior Lower Leg: Change dressing every day. Wound #3 Left,Midline,Anterior Lower Leg: Change dressing every day. Wound #4 Right,Midline,Anterior Lower Leg: Change dressing every day. Follow-up Appointments: Wound #1 Left,Anterior Lower Leg: Return Appointment in 1 week. Wound #2 Right,Anterior Lower Leg: Underhill, Elta L. ([id number]) Return Appointment in 1 week. Wound #3 Left,Midline,Anterior Lower Leg: Return Appointment in 1 week. Wound #4 Right,Midline,Anterior Lower Leg: Return Appointment in 1 week. Edema Control: Wound #1 Left,Anterior Lower Leg: Patient to wear own compression stockings Wound #2 Right,Anterior Lower Leg: Patient to wear own compression stockings Wound #3 Left,Midline,Anterior Lower  Leg: Patient to wear own compression stockings Wound #4 Right,Midline,Anterior Lower Leg: Patient to wear own compression stockings Home Health: Wound #1 Left,Anterior Lower Leg: Continue Home Health Visits Home Health Nurse may visit PRN to address patient s wound care needs. FACE TO FACE ENCOUNTER: MEDICARE and MEDICAID PATIENTS: I certify that this patient is under  my care and that I had a face-to-face encounter that meets the physician face-to-face encounter requirements with this patient on this date. The encounter with the patient was in whole or in part for the following MEDICAL CONDITION: (primary reason for Home Healthcare) MEDICAL NECESSITY: I certify, that based on my findings, NURSING services are a medically necessary home health service. HOME BOUND STATUS: I certify that my clinical findings support that this patient is homebound (i.e., Due to illness or injury, pt requires aid of supportive devices such as crutches, cane, wheelchairs, walkers, the use of special transportation or the assistance of another person to leave their place of residence. There is a normal inability to leave the home and doing so requires considerable and taxing effort. Other absences are for medical reasons / religious services and are infrequent or of short duration when for other reasons). If current dressing causes regression in wound condition, may D/C ordered dressing product/s and apply Normal Saline Moist Dressing daily until next Wound Healing Center / Other MD appointment. Notify Wound Healing Center of regression in wound condition at [phone]. Please direct any NON-WOUND related issues/requests for orders to patient's Primary Care Physician Wound #2 Right,Anterior Lower Leg: Continue Home Health Visits Home Health Nurse may visit PRN to address patient s wound care needs. FACE TO FACE ENCOUNTER: MEDICARE and MEDICAID PATIENTS: I certify that this patient is under my care and that I had  a face-to-face encounter that meets the physician face-to-face encounter requirements with this patient on this date. The encounter with the patient was in whole or in part for the following MEDICAL CONDITION: (primary reason for Home Healthcare) MEDICAL NECESSITY: I certify, that based on my findings, NURSING services are a medically necessary home health service. HOME BOUND STATUS: I certify that my clinical findings support that this patient is homebound (i.e., Due to illness or injury, pt requires aid of supportive devices such as crutches, cane, wheelchairs, walkers, the use of special transportation or the assistance of another person to leave their place of residence. There is a normal inability to leave the home and doing so requires considerable and taxing effort. Other absences are for medical reasons / religious services and are infrequent or of short duration when for other reasons). If current dressing causes regression in wound condition, may D/C ordered dressing product/s and apply Normal Saline Moist Dressing daily until next Wound Healing Center / Other MD appointment. Notify Wound Healing Center of regression in wound condition at [phone]. Please direct any NON-WOUND related issues/requests for orders to patient's Primary Care Physician Wound #3 Left,Midline,Anterior Lower Leg: Continue Home Health Visits EVELETTE, HOLLERN ([id number]) Home Health Nurse may visit PRN to address patient s wound care needs. FACE TO FACE ENCOUNTER: MEDICARE and MEDICAID PATIENTS: I certify that this patient is under my care and that I had a face-to-face encounter that meets the physician face-to-face encounter requirements with this patient on this date. The encounter with the patient was in whole or in part for the following MEDICAL CONDITION: (primary reason for Home Healthcare) MEDICAL NECESSITY: I certify, that based on my findings, NURSING services are a medically necessary home health  service. HOME BOUND STATUS: I certify that my clinical findings support that this patient is homebound (i.e., Due to illness or injury, pt requires aid of supportive devices such as crutches, cane, wheelchairs, walkers, the use of special transportation or the assistance of another person to leave their place of residence. There is a normal inability to leave  the home and doing so requires considerable and taxing effort. Other absences are for medical reasons / religious services and are infrequent or of short duration when for other reasons). If current dressing causes regression in wound condition, may D/C ordered dressing product/s and apply Normal Saline Moist Dressing daily until next Wound Healing Center / Other MD appointment. Notify Wound Healing Center of regression in wound condition at [phone]. Please direct any NON-WOUND related issues/requests for orders to patient's Primary Care Physician Wound #4 Right,Midline,Anterior Lower Leg: Continue Home Health Visits Home Health Nurse may visit PRN to address patient s wound care needs. FACE TO FACE ENCOUNTER: MEDICARE and MEDICAID PATIENTS: I certify that this patient is under my care and that I had a face-to-face encounter that meets the physician face-to-face encounter requirements with this patient on this date. The encounter with the patient was in whole or in part for the following MEDICAL CONDITION: (primary reason for Home Healthcare) MEDICAL NECESSITY: I certify, that based on my findings, NURSING services are a medically necessary home health service. HOME BOUND STATUS: I certify that my clinical findings support that this patient is homebound (i.e., Due to illness or injury, pt requires aid of supportive devices such as crutches, cane, wheelchairs, walkers, the use of special transportation or the assistance of another person to leave their place of residence. There is a normal inability to leave the home and doing so  requires considerable and taxing effort. Other absences are for medical reasons / religious services and are infrequent or of short duration when for other reasons). If current dressing causes regression in wound condition, may D/C ordered dressing product/s and apply Normal Saline Moist Dressing daily until next Wound Healing Center / Other MD appointment. Notify Wound Healing Center of regression in wound condition at [phone]. Please direct any NON-WOUND related issues/requests for orders to patient's Primary Care Physician Medications-please add to medication list.: Wound #1 Left,Anterior Lower Leg: Santyl Enzymatic Ointment Wound #2 Right,Anterior Lower Leg: Santyl Enzymatic Ointment I have recommended we use some Xeroform gauze over the blisters and use Santyl on the ulcerated areas. She should have a very light wrap placed and she can use the lymphedema pumps though with some caution. She will come back and see as next week. Electronic Signature(s) GRACIEANN, STANNARD ([id number]) Signed: 03/17/2015 1:51:36 PM By: Evlyn Kanner MD, FACS Entered By: Evlyn Kanner on 03/17/2015 13:51:36 Hagwood, [name] ([id number]) -------------------------------------------------------------------------------- SuperBill Details Patient Name: [name] Date of Service: 03/17/2015 Medical Record Number: [id number] Patient Account Number: [id number] Date of Birth/Sex: [birth year] (81 y.o. Female) Treating RN: Primary Care Physician: PATIENT, NO Other Clinician: Referring Physician: Marisue Ivan Treating Physician/Extender: Rudene Re in Treatment: 2 Diagnosis Coding ICD-10 Codes Code Description [id number] Non-pressure chronic ulcer of right calf with fat layer exposed L97.222 Non-pressure chronic ulcer of left calf with fat layer exposed I89.0 Lymphedema, not elsewhere classified Facility Procedures CPT4 Code: [id number] Description: 11042 - DEB SUBQ TISSUE 20 SQ CM/< ICD-10  Description Diagnosis L97.212 Non-pressure chronic ulcer of right calf with fat L97.222 Non-pressure chronic ulcer of left calf with fat l I89.0 Lymphedema, not elsewhere classified Modifier: layer exposed ayer exposed Quantity: 1 Physician Procedures CPT4 Code: [id number] Description: 11042 - WC PHYS SUBQ TISS 20 SQ CM ICD-10 Description Diagnosis L97.212 Non-pressure chronic ulcer of right calf with fat L97.222 Non-pressure chronic ulcer of left calf with fat l I89.0 Lymphedema, not elsewhere classified Modifier: layer exposed ayer exposed Quantity: 1 Electronic Signature(s) Signed: 03/17/2015 1:51:46 PM  By: Evlyn Kanner MD, FACS Entered By: Evlyn Kanner on 03/17/2015 13:51:46

## 2015-03-18 NOTE — Progress Notes (Signed)
ZANETTA, DEHAAN ([id number]) Visit Report for 03/17/2015 Arrival Information Details Patient Name: BAILLEY, GUILFORD Date of Service: 03/17/2015 1:00 PM Medical Record Number: [id number] Patient Account Number: [id number] Date of Birth/Sex: Mar 18, 1913 (81 y.o. Female) Treating RN: Huel Coventry Primary Care Physician: PATIENT, NO Other Clinician: Referring Physician: Marisue Ivan Treating Physician/Extender: Rudene Re in Treatment: 2 Visit Information History Since Last Visit Added or deleted any medications: No Patient Arrived: Wheel Chair Any new allergies or adverse reactions: No Arrival Time: 13:07 Had a fall or experienced change in No activities of daily living that may affect Accompanied By: caregiver risk of falls: Transfer Assistance: Manual Signs or symptoms of abuse/neglect since last No Patient Identification Verified: Yes visito Secondary Verification Process Yes Hospitalized since last visit: No Completed: Has Dressing in Place as Prescribed: Yes Patient Requires Transmission-Based No Pain Present Now: No Precautions: Patient Has Alerts: Yes Electronic Signature(s) Signed: 03/17/2015 4:42:55 PM By: Elliot Gurney, RN, BSN, Kim RN, BSN Entered By: Elliot Gurney, RN, BSN, Kim on 03/17/2015 13:11:29 Minish, [name] ([id number]) -------------------------------------------------------------------------------- Encounter Discharge Information Details Patient Name: [name] Date of Service: 03/17/2015 1:00 PM Medical Record Number: [id number] Patient Account Number: [id number] Date of Birth/Sex: 07/07/1913 (81 y.o. Female) Treating RN: Huel Coventry Primary Care Physician: PATIENT, NO Other Clinician: Referring Physician: Marisue Ivan Treating Physician/Extender: Rudene Re in Treatment: 2 Encounter Discharge Information Items Discharge Pain Level: 0 Discharge Condition: Stable Ambulatory Status: Wheelchair Discharge Destination:  Home Transportation: Private Auto Ann Accompanied By: Caregiver Schedule Follow-up Appointment: Yes Medication Reconciliation completed Yes and provided to Patient/Care Walfred Bettendorf: Clinical Summary of Care: Electronic Signature(s) Signed: 03/17/2015 4:42:55 PM By: Elliot Gurney RN, BSN, Kim RN, BSN Entered By: Elliot Gurney, RN, BSN, Kim on 03/17/2015 13:55:44 Sebesta, [name] ([id number]) -------------------------------------------------------------------------------- Lower Extremity Assessment Details Patient Name: [name] Date of Service: 03/17/2015 1:00 PM Medical Record Number: [id number] Patient Account Number: [id number] Date of Birth/Sex: September 15, 1912 (81 y.o. Female) Treating RN: Huel Coventry Primary Care Physician: PATIENT, NO Other Clinician: Referring Physician: Marisue Ivan Treating Physician/Extender: Rudene Re in Treatment: 2 Edema Assessment Assessed: [Left: No] [Right: No] E[Left: dema] [Right: :] Calf Left: Right: Point of Measurement: 35 cm From Medial Instep 37 cm 36.8 cm Ankle Left: Right: Point of Measurement: 8 cm From Medial Instep 22.6 cm 22.4 cm Vascular Assessment Pulses: Posterior Tibial Dorsalis Pedis Palpable: [Left:No] [Right:No] Doppler: [Left:Monophasic] [Right:Monophasic] Extremity colors, hair growth, and conditions: Extremity Color: [Left:Hyperpigmented] [Right:Hyperpigmented] Hair Growth on Extremity: [Left:Yes] [Right:Yes] Temperature of Extremity: [Left:Warm] [Right:Warm] Capillary Refill: [Left:< 3 seconds] [Right:< 3 seconds] Toe Nail Assessment Left: Right: Thick: Yes Yes Discolored: No No Deformed: No No Improper Length and Hygiene: No No Electronic Signature(s) Signed: 03/17/2015 4:42:55 PM By: Elliot Gurney, RN, BSN, Kim RN, BSN Entered By: Elliot Gurney, RN, BSN, Kim on 03/17/2015 13:20:34 Tonner, [name] ([id number]) Mcgivern, [name]  ([id number]) -------------------------------------------------------------------------------- Multi Wound Chart Details Patient Name: [name] Date of Service: 03/17/2015 1:00 PM Medical Record Number: [id number] Patient Account Number: [id number] Date of Birth/Sex: 09-29-1912 (81 y.o. Female) Treating RN: Curtis Sites Primary Care Physician: PATIENT, NO Other Clinician: Referring Physician: Marisue Ivan Treating Physician/Extender: Rudene Re in Treatment: 2 Vital Signs Height(in): 64 Pulse(bpm): 67 Weight(lbs): 147 Blood Pressure 141/57 (mmHg): Body Mass Index(BMI): 25 Temperature(F): 97.8 Respiratory Rate 16 (breaths/min): Photos: [1:No Photos] [2:No Photos] [3:No Photos] Wound Location: [1:Left Lower Leg - Anterior Right Lower Leg - Anterior Left Lower Leg - Midline,] [3:Anterior] Wounding Event: [1:Trauma] [2:Trauma] [3:Blister] Primary Etiology: [1:Skin Tear] [2:Skin  Tear] [3:Lymphedema] Comorbid History: [1:Hypertension, Osteoarthritis] [2:Hypertension, Osteoarthritis] [3:Hypertension, Osteoarthritis] Date Acquired: [1:01/31/2015] [2:01/31/2015] [3:03/14/2015] Weeks of Treatment: [1:2] [2:2] [3:0] Wound Status: [1:Open] [2:Open] [3:Open] Clustered Wound: [1:No] [2:No] [3:No] Measurements L x W x D 6x5x0.4 [2:2.5x1.2x0.2] [3:3x2x0.1] (cm) Area (cm) : [1:23.562] [2:2.356] [3:4.712] Volume (cm) : [1:9.425] [2:0.471] [3:0.471] % Reduction in Area: [1:34.20%] [2:53.10%] [3:0.00%] % Reduction in Volume: 12.30% [2:68.80%] [3:0.00%] Classification: [1:Full Thickness Without Exposed Support Structures] [2:Full Thickness Without Exposed Support Structures] [3:Partial Thickness] Exudate Amount: [1:Medium] [2:Medium] [3:N/A] Exudate Type: [1:Serosanguineous] [2:Serosanguineous] [3:N/A] Exudate Color: [1:red, brown] [2:red, brown] [3:N/A] Wound Margin: [1:Distinct, outline attached Distinct, outline attached Indistinct, nonvisible] Granulation Amount:  [1:Small (1-33%)] [2:None Present (0%)] [3:N/A] Granulation Quality: [1:Pink, Pale] [2:N/A] [3:N/A] Necrotic Amount: [1:Large (67-100%)] [2:Large (67-100%)] [3:N/A] Necrotic Tissue: [1:Eschar, Adherent Slough Adherent Slough] [3:N/A] Exposed Structures: [1:Fascia: No Fat: No] [2:Fascia: No Fat: No] [3:Fascia: No Fat: No] Tendon: No Tendon: No Tendon: No Muscle: No Muscle: No Muscle: No Joint: No Joint: No Joint: No Bone: No Bone: No Bone: No Limited to Skin Limited to Skin Limited to Skin Breakdown Breakdown Breakdown Epithelialization: Small (1-33%) Small (1-33%) N/A Periwound Skin Texture: Edema: Yes Edema: Yes Edema: No Excoriation: No Excoriation: No Excoriation: No Induration: No Induration: No Induration: No Callus: No Callus: No Callus: No Crepitus: No Crepitus: No Crepitus: No Fluctuance: No Fluctuance: No Fluctuance: No Friable: No Friable: No Friable: No Rash: No Rash: No Rash: No Scarring: No Scarring: No Scarring: No Periwound Skin Moist: Yes Moist: Yes Maceration: No Moisture: Maceration: No Maceration: No Moist: No Dry/Scaly: No Dry/Scaly: No Dry/Scaly: No Periwound Skin Color: Atrophie Blanche: No Atrophie Blanche: No Atrophie Blanche: No Cyanosis: No Cyanosis: No Cyanosis: No Ecchymosis: No Ecchymosis: No Ecchymosis: No Erythema: No Erythema: No Erythema: No Hemosiderin Staining: No Hemosiderin Staining: No Hemosiderin Staining: No Mottled: No Mottled: No Mottled: No Pallor: No Pallor: No Pallor: No Rubor: No Rubor: No Rubor: No Temperature: No Abnormality No Abnormality N/A Tenderness on No Yes No Palpation: Wound Preparation: Ulcer Cleansing: Ulcer Cleansing: Ulcer Cleansing: Not Rinsed/Irrigated with Rinsed/Irrigated with Cleansed Saline Saline Topical Anesthetic Topical Anesthetic Topical Anesthetic Applied: None Applied: Other: Lidocaine Applied: Other: lidocaine 4% 4% ceam Wound Number: 4 N/A N/A Photos:  No Photos N/A N/A Wound Location: Right Lower Leg - Midline, N/A N/A Anterior Wounding Event: Blister N/A N/A Primary Etiology: Lymphedema N/A N/A Comorbid History: Hypertension, N/A N/A Osteoarthritis Date Acquired: 03/14/2015 N/A N/A Weeks of Treatment: 0 N/A N/A Wound Status: Open N/A N/A Clustered Wound: Yes N/A N/A Popov, Luana L. ([id number]) Measurements L x W x D 8x3x0.1 N/A N/A (cm) Area (cm) : 18.85 N/A N/A Volume (cm) : 1.885 N/A N/A % Reduction in Area: 0.00% N/A N/A % Reduction in Volume: 0.00% N/A N/A Classification: Partial Thickness N/A N/A Exudate Amount: N/A N/A N/A Exudate Type: N/A N/A N/A Exudate Color: N/A N/A N/A Wound Margin: Indistinct, nonvisible N/A N/A Granulation Amount: N/A N/A N/A Granulation Quality: N/A N/A N/A Necrotic Amount: N/A N/A N/A Necrotic Tissue: N/A N/A N/A Exposed Structures: Fascia: No N/A N/A Fat: No Tendon: No Muscle: No Joint: No Bone: No Limited to Skin Breakdown Epithelialization: N/A N/A N/A Periwound Skin Texture: Edema: No N/A N/A Excoriation: No Induration: No Callus: No Crepitus: No Fluctuance: No Friable: No Rash: No Scarring: No Periwound Skin Maceration: No N/A N/A Moisture: Moist: No Dry/Scaly: No Periwound Skin Color: Atrophie Blanche: No N/A N/A Cyanosis: No Ecchymosis: No Erythema: No Hemosiderin Staining: No Mottled: No Pallor: No Rubor: No  Temperature: N/A N/A N/A Tenderness on No N/A N/A Palpation: Wound Preparation: Ulcer Cleansing: Not N/A N/A Cleansed Krontz, Delphine L. ([id number]) Topical Anesthetic Applied: None Treatment Notes Electronic Signature(s) Signed: 03/17/2015 4:33:06 PM By: Curtis Sites Entered By: Curtis Sites on 03/17/2015 13:35:12 Debroux, [name] ([id number]) -------------------------------------------------------------------------------- Multi-Disciplinary Care Plan Details Patient Name: [name] Date of Service: 03/17/2015 1:00 PM Medical Record  Number: [id number] Patient Account Number: [id number] Date of Birth/Sex: 12-25-1912 (81 y.o. Female) Treating RN: Curtis Sites Primary Care Physician: PATIENT, NO Other Clinician: Referring Physician: Marisue Ivan Treating Physician/Extender: Rudene Re in Treatment: 2 Active Inactive Abuse / Safety / Falls / Self Care Management Nursing Diagnoses: Impaired home maintenance Impaired physical mobility Potential for falls Self care deficit: actual or potential Goals: Patient will remain injury free Date Initiated: 03/03/2015 Goal Status: Active Patient/caregiver will verbalize understanding of skin care regimen Date Initiated: 03/03/2015 Goal Status: Active Patient/caregiver will verbalize/demonstrate measure taken to improve self care Date Initiated: 03/03/2015 Goal Status: Active Patient/caregiver will verbalize/demonstrate measures taken to improve the patient's personal safety Date Initiated: 03/03/2015 Goal Status: Active Patient/caregiver will verbalize/demonstrate measures taken to prevent injury and/or falls Date Initiated: 03/03/2015 Goal Status: Active Patient/caregiver will verbalize/demonstrate understanding of what to do in case of emergency Date Initiated: 03/03/2015 Goal Status: Active Interventions: Assess: immobility, friction, shearing, incontinence upon admission and as needed Assess impairment of mobility on admission and as needed per policy Provide education on basic hygiene Provide education on personal and home safety Provide education on safe transfers KHALIAH, BARNICK ([id number]) Treatment Activities: Education provided on Basic Hygiene : 03/10/2015 Notes: Orientation to the Wound Care Program Nursing Diagnoses: Knowledge deficit related to the wound healing center program Goals: Patient/caregiver will verbalize understanding of the Wound Healing Center Program Date Initiated: 03/03/2015 Goal Status: Active Interventions: Provide education  on orientation to the wound center Notes: Venous Leg Ulcer Nursing Diagnoses: Knowledge deficit related to disease process and management Potential for venous Insuffiency (use before diagnosis confirmed) Goals: Patient will maintain optimal edema control Date Initiated: 03/03/2015 Goal Status: Active Patient/caregiver will verbalize understanding of disease process and disease management Date Initiated: 03/03/2015 Goal Status: Active Verify adequate tissue perfusion prior to therapeutic compression application Date Initiated: 03/03/2015 Goal Status: Active Interventions: Assess peripheral edema status every visit. Compression as ordered Provide education on venous insufficiency Treatment Activities: Test ordered outside of clinic : 03/17/2015 Notes: ADRI, SCHLOSS ([id number]) Wound/Skin Impairment Nursing Diagnoses: Impaired tissue integrity Knowledge deficit related to ulceration/compromised skin integrity Goals: Patient/caregiver will verbalize understanding of skin care regimen Date Initiated: 03/03/2015 Goal Status: Active Ulcer/skin breakdown will have a volume reduction of 30% by week 4 Date Initiated: 03/03/2015 Goal Status: Active Ulcer/skin breakdown will have a volume reduction of 50% by week 8 Date Initiated: 03/03/2015 Goal Status: Active Ulcer/skin breakdown will have a volume reduction of 80% by week 12 Date Initiated: 03/03/2015 Goal Status: Active Ulcer/skin breakdown will heal within 14 weeks Date Initiated: 03/03/2015 Goal Status: Active Interventions: Assess patient/caregiver ability to perform ulcer/skin care regimen upon admission and as needed Assess ulceration(s) every visit Provide education on ulcer and skin care Notes: Electronic Signature(s) Signed: 03/17/2015 4:33:06 PM By: Curtis Sites Entered By: Curtis Sites on 03/17/2015 13:34:55 Yochim, [name] ([id number]) -------------------------------------------------------------------------------- Pain  Assessment Details Patient Name: [name] Date of Service: 03/17/2015 1:00 PM Medical Record Number: [id number] Patient Account Number: [id number] Date of Birth/Sex: 1912/09/04 (81 y.o. Female) Treating RN: Huel Coventry Primary Care Physician: PATIENT, NO Other Clinician: Referring  Physician: Marisue Ivan Treating Physician/Extender: Rudene Re in Treatment: 2 Active Problems Location of Pain Severity and Description of Pain Patient Has Paino No Site Locations Pain Management and Medication Current Pain Management: Electronic Signature(s) Signed: 03/17/2015 4:42:55 PM By: Elliot Gurney, RN, BSN, Kim RN, BSN Entered By: Elliot Gurney, RN, BSN, Kim on 03/17/2015 13:11:35 Couchman, [name] ([id number]) -------------------------------------------------------------------------------- Patient/Caregiver Education Details Patient Name: [name] Date of Service: 03/17/2015 1:00 PM Medical Record Number: [id number] Patient Account Number: [id number] Date of Birth/Gender: October 16, 1912 (81 y.o. Female) Treating RN: Curtis Sites Primary Care Physician: PATIENT, NO Other Clinician: Referring Physician: Marisue Ivan Treating Physician/Extender: Rudene Re in Treatment: 2 Education Assessment Education Provided To: Patient Education Topics Provided Wound/Skin Impairment: Handouts: Other: wound care as ordered Methods: Demonstration, Explain/Verbal Responses: State content correctly Electronic Signature(s) Signed: 03/17/2015 4:33:06 PM By: Curtis Sites Entered By: Curtis Sites on 03/17/2015 13:37:07 Lucchesi, [name] ([id number]) -------------------------------------------------------------------------------- Wound Assessment Details Patient Name: [name] Date of Service: 03/17/2015 1:00 PM Medical Record Number: [id number] Patient Account Number: [id number] Date of Birth/Sex: Mar 11, 1913 (81 y.o. Female) Treating RN: Huel Coventry Primary Care Physician:  PATIENT, NO Other Clinician: Referring Physician: Marisue Ivan Treating Physician/Extender: Rudene Re in Treatment: 2 Wound Status Wound Number: 1 Primary Etiology: Skin Tear Wound Location: Left Lower Leg - Anterior Wound Status: Open Wounding Event: Trauma Comorbid History: Hypertension, Osteoarthritis Date Acquired: 01/31/2015 Weeks Of Treatment: 2 Clustered Wound: No Photos Photo Uploaded By: Elliot Gurney, RN, BSN, Kim on 03/17/2015 14:42:55 Wound Measurements Length: (cm) 6 % Reduction in Width: (cm) 5 % Reduction in Depth: (cm) 0.4 Epithelializati Area: (cm) 23.562 Volume: (cm) 9.425 Area: 34.2% Volume: 12.3% on: Small (1-33%) Wound Description Full Thickness Without Exposed Classification: Support Structures Wound Margin: Distinct, outline attached Exudate Medium Amount: Faircloth, Yoali L. ([id number]) Foul Odor After Cleansing: No Exudate Type: Serosanguineous Exudate Color: red, brown Wound Bed Granulation Amount: Small (1-33%) Exposed Structure Granulation Quality: Pink, Pale Fascia Exposed: No Necrotic Amount: Large (67-100%) Fat Layer Exposed: No Necrotic Quality: Eschar, Adherent Slough Tendon Exposed: No Muscle Exposed: No Joint Exposed: No Bone Exposed: No Limited to Skin Breakdown Periwound Skin Texture Texture Color No Abnormalities Noted: No No Abnormalities Noted: No Callus: No Atrophie Blanche: No Crepitus: No Cyanosis: No Excoriation: No Ecchymosis: No Fluctuance: No Erythema: No Friable: No Hemosiderin Staining: No Induration: No Mottled: No Localized Edema: Yes Pallor: No Rash: No Rubor: No Scarring: No Temperature / Pain Moisture Temperature: No Abnormality No Abnormalities Noted: No Dry / Scaly: No Maceration: No Moist: Yes Wound Preparation Ulcer Cleansing: Rinsed/Irrigated with Saline Topical Anesthetic Applied: Other: Lidocaine 4%, Treatment Notes Wound #1 (Left, Anterior Lower Leg) 1. Cleansed  with: Clean wound with Normal Saline 2. Anesthetic Topical Lidocaine 4% cream to wound bed prior to debridement 4. Dressing Applied: Santyl Ointment 5. Secondary Dressing Applied ABD and Kerlix/Conform Electronic Signature(s) CHIEKO, NEISES ([id number]) Signed: 03/17/2015 4:42:55 PM By: Elliot Gurney, RN, BSN, Kim RN, BSN Entered By: Elliot Gurney, RN, BSN, Kim on 03/17/2015 13:25:07 Yeomans, [name] ([id number]) -------------------------------------------------------------------------------- Wound Assessment Details Patient Name: [name] Date of Service: 03/17/2015 1:00 PM Medical Record Number: [id number] Patient Account Number: [id number] Date of Birth/Sex: May 18, 1913 (81 y.o. Female) Treating RN: Huel Coventry Primary Care Physician: PATIENT, NO Other Clinician: Referring Physician: Marisue Ivan Treating Physician/Extender: Rudene Re in Treatment: 2 Wound Status Wound Number: 2 Primary Etiology: Skin Tear Wound Location: Right Lower Leg - Anterior Wound Status: Open Wounding Event: Trauma Comorbid History: Hypertension, Osteoarthritis Date Acquired:  01/31/2015 Weeks Of Treatment: 2 Clustered Wound: No Photos Photo Uploaded By: Elliot Gurney, RN, BSN, Kim on 03/17/2015 14:42:56 Wound Measurements Length: (cm) 2.5 % Reduction in Width: (cm) 1.2 % Reduction in Depth: (cm) 0.2 Epithelializati Area: (cm) 2.356 Volume: (cm) 0.471 Area: 53.1% Volume: 68.8% on: Small (1-33%) Wound Description Full Thickness Without Exposed Classification: Support Structures Wound Margin: Distinct, outline attached Exudate Medium Amount: Exudate Type: Serosanguineous Exudate Color: red, brown Foul Odor After Cleansing: No Wound Bed Granulation Amount: None Present (0%) Exposed Structure Necrotic Amount: Large (67-100%) Fascia Exposed: No Necrotic Quality: Adherent Slough Fat Layer Exposed: No Mackley, Xinyi L. ([id number]) Tendon Exposed: No Muscle Exposed: No Joint Exposed:  No Bone Exposed: No Limited to Skin Breakdown Periwound Skin Texture Texture Color No Abnormalities Noted: No No Abnormalities Noted: No Callus: No Atrophie Blanche: No Crepitus: No Cyanosis: No Excoriation: No Ecchymosis: No Fluctuance: No Erythema: No Friable: No Hemosiderin Staining: No Induration: No Mottled: No Localized Edema: Yes Pallor: No Rash: No Rubor: No Scarring: No Temperature / Pain Moisture Temperature: No Abnormality No Abnormalities Noted: No Tenderness on Palpation: Yes Dry / Scaly: No Maceration: No Moist: Yes Wound Preparation Ulcer Cleansing: Rinsed/Irrigated with Saline Topical Anesthetic Applied: Other: lidocaine 4% ceam, Treatment Notes Wound #2 (Right, Anterior Lower Leg) 1. Cleansed with: Clean wound with Normal Saline 2. Anesthetic Topical Lidocaine 4% cream to wound bed prior to debridement 4. Dressing Applied: Santyl Ointment 5. Secondary Dressing Applied ABD and Kerlix/Conform Electronic Signature(s) Signed: 03/17/2015 4:42:55 PM By: Elliot Gurney, RN, BSN, Kim RN, BSN Entered By: Elliot Gurney, RN, BSN, Kim on 03/17/2015 13:25:16 Towry, [name] ([id number]) -------------------------------------------------------------------------------- Wound Assessment Details Patient Name: [name] Date of Service: 03/17/2015 1:00 PM Medical Record Number: [id number] Patient Account Number: [id number] Date of Birth/Sex: 02-27-1913 (81 y.o. Female) Treating RN: Huel Coventry Primary Care Physician: PATIENT, NO Other Clinician: Referring Physician: Marisue Ivan Treating Physician/Extender: Rudene Re in Treatment: 2 Wound Status Wound Number: 3 Primary Etiology: Lymphedema Wound Location: Left Lower Leg - Midline, Wound Status: Open Anterior Comorbid History: Hypertension, Osteoarthritis Wounding Event: Blister Date Acquired: 03/14/2015 Weeks Of Treatment: 0 Clustered Wound: No Photos Photo Uploaded By: Elliot Gurney, RN, BSN, Kim on  03/17/2015 14:42:57 Wound Measurements Length: (cm) 3 % Reduction in Width: (cm) 2 % Reduction in Depth: (cm) 0.1 Area: (cm) 4.712 Volume: (cm) 0.471 Area: 0% Volume: 0% Wound Description Classification: Partial Thickness Wound Margin: Indistinct, nonvisible Wound Bed Exposed Structure Fascia Exposed: No Fat Layer Exposed: No Tendon Exposed: No Muscle Exposed: No Joint Exposed: No Elliott, Fantasy L. ([id number]) Bone Exposed: No Limited to Skin Breakdown Periwound Skin Texture Texture Color No Abnormalities Noted: No No Abnormalities Noted: No Callus: No Atrophie Blanche: No Crepitus: No Cyanosis: No Excoriation: No Ecchymosis: No Fluctuance: No Erythema: No Friable: No Hemosiderin Staining: No Induration: No Mottled: No Localized Edema: No Pallor: No Rash: No Rubor: No Scarring: No Moisture No Abnormalities Noted: No Dry / Scaly: No Maceration: No Moist: No Wound Preparation Ulcer Cleansing: Not Cleansed Topical Anesthetic Applied: None Treatment Notes Wound #3 (Left, Midline, Anterior Lower Leg) 4. Dressing Applied: Petroleum gauze 5. Secondary Dressing Applied ABD and Kerlix/Conform Electronic Signature(s) Signed: 03/17/2015 4:42:55 PM By: Elliot Gurney, RN, BSN, Kim RN, BSN Entered By: Elliot Gurney, RN, BSN, Kim on 03/17/2015 13:31:16 Pavelko, [name] ([id number]) -------------------------------------------------------------------------------- Wound Assessment Details Patient Name: [name] Date of Service: 03/17/2015 1:00 PM Medical Record Number: [id number] Patient Account Number: [id number] Date of Birth/Sex: [birth year] (81 y.o. Female) Treating RN: Huel Coventry Primary  Care Physician: PATIENT, NO Other Clinician: Referring Physician: Marisue Ivan Treating Physician/Extender: Rudene Re in Treatment: 2 Wound Status Wound Number: 4 Primary Etiology: Lymphedema Wound Location: Right Lower Leg - Midline, Wound Status: Open Anterior  Comorbid History: Hypertension, Osteoarthritis Wounding Event: Blister Date Acquired: 03/14/2015 Weeks Of Treatment: 0 Clustered Wound: Yes Photos Photo Uploaded By: Elliot Gurney, RN, BSN, Kim on 03/17/2015 14:43:50 Wound Measurements Length: (cm) 8 % Reduction in Width: (cm) 3 % Reduction in Depth: (cm) 0.1 Area: (cm) 18.85 Volume: (cm) 1.885 Area: 0% Volume: 0% Wound Description Classification: Partial Thickness Wound Margin: Indistinct, nonvisible Wound Bed Maresh, Deniz L. ([id number]) Exposed Structure Fascia Exposed: No Fat Layer Exposed: No Tendon Exposed: No Muscle Exposed: No Joint Exposed: No Bone Exposed: No Limited to Skin Breakdown Periwound Skin Texture Texture Color No Abnormalities Noted: No No Abnormalities Noted: No Callus: No Atrophie Blanche: No Crepitus: No Cyanosis: No Excoriation: No Ecchymosis: No Fluctuance: No Erythema: No Friable: No Hemosiderin Staining: No Induration: No Mottled: No Localized Edema: No Pallor: No Rash: No Rubor: No Scarring: No Moisture No Abnormalities Noted: No Dry / Scaly: No Maceration: No Moist: No Wound Preparation Ulcer Cleansing: Not Cleansed Topical Anesthetic Applied: None Treatment Notes Wound #4 (Right, Midline, Anterior Lower Leg) 4. Dressing Applied: Petroleum gauze 5. Secondary Dressing Applied ABD and Kerlix/Conform Electronic Signature(s) Signed: 03/17/2015 4:42:55 PM By: Elliot Gurney, RN, BSN, Kim RN, BSN Entered By: Elliot Gurney, RN, BSN, Kim on 03/17/2015 13:30:58 Mcglown, [name] ([id number]) -------------------------------------------------------------------------------- Vitals Details Patient Name: [name] Date of Service: 03/17/2015 1:00 PM Medical Record Number: [id number] Patient Account Number: [id number] Date of Birth/Sex: 03/08/1913 (81 y.o. Female) Treating RN: Huel Coventry Primary Care Physician: PATIENT, NO Other Clinician: Referring Physician: Marisue Ivan Treating  Physician/Extender: Rudene Re in Treatment: 2 Vital Signs Time Taken: 13:12 Temperature (F): 97.8 Height (in): 64 Pulse (bpm): 67 Weight (lbs): 147 Respiratory Rate (breaths/min): 16 Body Mass Index (BMI): 25.2 Blood Pressure (mmHg): 141/57 Reference Range: 80 - 120 mg / dl Electronic Signature(s) Signed: 03/17/2015 4:42:55 PM By: Elliot Gurney, RN, BSN, Kim RN, BSN Entered By: Elliot Gurney, RN, BSN, Kim on 03/17/2015 13:12:19

## 2015-03-24 ENCOUNTER — Encounter: Payer: Self-pay | Admitting: General Surgery

## 2015-03-24 ENCOUNTER — Encounter (HOSPITAL_BASED_OUTPATIENT_CLINIC_OR_DEPARTMENT_OTHER): Payer: Medicare Other | Admitting: General Surgery

## 2015-03-24 DIAGNOSIS — L97222 Non-pressure chronic ulcer of left calf with fat layer exposed: Secondary | ICD-10-CM

## 2015-03-24 DIAGNOSIS — L97212 Non-pressure chronic ulcer of right calf with fat layer exposed: Secondary | ICD-10-CM | POA: Diagnosis not present
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
## 2015-03-24 NOTE — Progress Notes (Signed)
See i heal 

## 2015-03-25 NOTE — Progress Notes (Signed)
[name] ([id number]) Visit Report for 03/24/2015 Arrival Information Details Patient Name: Leah, [name] Date of Service: 03/24/2015 1:00 PM Medical Record Number: [id number] Patient Account Number: [id number] Date of Birth/Sex: [birth year] (81 y.o. Female) Treating RN: [name], RN, BSN, [employer] Primary Care Physician: PATIENT, NO Other Clinician: Referring Physician: Marisue Ivan Treating Physician/Extender: Rudene Re in Treatment: 3 Visit Information History Since Last Visit Any new allergies or adverse reactions: No Patient Arrived: Wheel Chair Had a fall or experienced change in No activities of daily living that may affect Arrival Time: 13:07 risk of falls: Accompanied By: caregiver Signs or symptoms of abuse/neglect since last No Transfer Assistance: Manual visito Patient Identification Verified: Yes Hospitalized since last visit: No Secondary Verification Process Yes Has Dressing in Place as Prescribed: Yes Completed: Pain Present Now: No Patient Requires Transmission-Based No Precautions: Patient Has Alerts: Yes Electronic Signature(s) Signed: 03/24/2015 2:15:03 PM By: Ardath Sax MD Entered By: Ardath Sax on 03/24/2015 14:15:03 [name] ([id number]) -------------------------------------------------------------------------------- Encounter Discharge Information Details Patient Name: [name] Date of Service: 03/24/2015 1:00 PM Medical Record Number: [id number] Patient Account Number: [id number] Date of Birth/Sex: 22-Sep-1912 (81 y.o. Female) Treating RN: Clover Mealy, RN, BSN, [employer] Primary Care Physician: PATIENT, NO Other Clinician: Referring Physician: Marisue Ivan Treating Physician/Extender: Elayne Snare in Treatment: 3 Encounter Discharge Information Items Discharge Pain Level: 0 Discharge Condition: Stable Ambulatory Status: Ambulatory Discharge Destination: Home Private Transportation: Auto Accompanied By:  caregiver Schedule Follow-up Appointment: No Medication Reconciliation completed and No provided to Patient/Care Willma Obando: Clinical Summary of Care: Electronic Signature(s) Signed: 03/24/2015 5:24:15 PM By: Elpidio Eric BSN, RN Previous Signature: 03/24/2015 2:20:33 PM Version By: Ardath Sax MD Entered By: Elpidio Eric on 03/24/2015 15:52:18 [name] ([id number]) -------------------------------------------------------------------------------- Lower Extremity Assessment Details Patient Name: [name] Date of Service: 03/24/2015 1:00 PM Medical Record Number: [id number] Patient Account Number: [id number] Date of Birth/Sex: 1913-08-08 (81 y.o. Female) Treating RN: [name], RN, BSN, Psychologist, clinical Primary Care Physician: PATIENT, NO Other Clinician: Referring Physician: Marisue Ivan Treating Physician/Extender: Rudene Re in Treatment: 3 Edema Assessment Assessed: [Left: No] [Right: No] E[Left: dema] [Right: :] Calf Left: Right: Point of Measurement: 35 cm From Medial Instep 38.5 cm 37.8 cm Ankle Left: Right: Point of Measurement: 8 cm From Medial Instep 22.8 cm 22.8 cm Vascular Assessment Pulses: Posterior Tibial Dorsalis Pedis Palpable: [Left:Yes] [Right:Yes] Extremity colors, hair growth, and conditions: Extremity Color: [Left:Mottled] [Right:Mottled] Hair Growth on Extremity: [Left:No] [Right:No] Temperature of Extremity: [Left:Warm] [Right:Warm] Capillary Refill: [Left:< 3 seconds] [Right:< 3 seconds] Toe Nail Assessment Left: Right: Thick: Yes Yes Discolored: Yes Yes Deformed: No No Improper Length and Hygiene: No No Electronic Signature(s) Signed: 03/24/2015 5:24:15 PM By: Elpidio Eric BSN, RN Entered By: Elpidio Eric on 03/24/2015 13:12:56 [name] ([id number]) -------------------------------------------------------------------------------- Multi Wound Chart Details Patient Name: [name] Date of Service: 03/24/2015 1:00 PM Medical  Record Number: [id number] Patient Account Number: [id number] Date of Birth/Sex: [birth year] (81 y.o. Female) Treating RN: Clover Mealy, RN, BSN, [employer] Primary Care Physician: PATIENT, NO Other Clinician: Referring Physician: Marisue Ivan Treating Physician/Extender: Elayne Snare in Treatment: 3 Vital Signs Height(in): 64 Pulse(bpm): Weight(lbs): 147 Blood Pressure (mmHg): Body Mass Index(BMI): 25 Temperature(F): Respiratory Rate 18 (breaths/min): Photos: [1:No Photos] [2:No Photos] [3:No Photos] Wound Location: [1:Left Lower Leg - Anterior Right Lower Leg - Anterior Left Lower Leg - Midline,] [3:Anterior] Wounding Event: [1:Trauma] [2:Trauma] [3:Blister] Primary Etiology: [1:Skin Tear] [2:Skin Tear] [3:Lymphedema] Comorbid History: [1:Hypertension, Osteoarthritis] [2:Hypertension, Osteoarthritis] [3:Hypertension, Osteoarthritis] Date Acquired: [  1:01/31/2015] [2:01/31/2015] [3:03/14/2015] Weeks of Treatment: [1:3] [2:3] [3:1] Wound Status: [1:Open] [2:Open] [3:Open] Clustered Wound: [1:No] [2:No] [3:No] Measurements L x W x D 1.8x2x0.1 [2:3.3x2x0.1] [3:7.5x6x0.4] (cm) Area (cm) : [1:2.827] [2:5.184] [3:35.343] Volume (cm) : [1:0.283] [2:0.518] [3:14.137] % Reduction in Area: [1:92.10%] [2:-3.10%] [3:-650.10%] % Reduction in Volume: 97.40% [2:65.60%] [3:-2901.50%] Classification: [1:Full Thickness Without Exposed Support Structures] [2:Full Thickness Without Exposed Support Structures] [3:Partial Thickness] Exudate Amount: [1:Medium] [2:Medium] [3:Medium] Exudate Type: [1:Serosanguineous] [2:Serosanguineous] [3:Serosanguineous] Exudate Color: [1:red, brown] [2:red, brown] [3:red, brown] Wound Margin: [1:Distinct, outline attached Distinct, outline attached Indistinct, nonvisible] Granulation Amount: [1:Small (1-33%)] [2:None Present (0%)] [3:Small (1-33%)] Granulation Quality: [1:Pink, Pale] [2:N/A] [3:Pink, Pale] Necrotic Amount: [1:Large (67-100%)] [2:Large (67-100%)]  [3:Medium (34-66%)] Necrotic Tissue: [1:Eschar, Adherent Slough Adherent Slough] [3:Adherent Slough] Exposed Structures: [1:Fascia: No Fat: No] [2:Fascia: No Fat: No] [3:Fascia: No Fat: No] Tendon: No Tendon: No Tendon: No Muscle: No Muscle: No Muscle: No Joint: No Joint: No Joint: No Bone: No Bone: No Bone: No Limited to Skin Limited to Skin Limited to Skin Breakdown Breakdown Breakdown Epithelialization: Small (1-33%) Small (1-33%) N/A Debridement: N/A N/A Debridement ([id number]) Time-Out Taken: N/A N/A Yes Pain Control: N/A N/A Lidocaine 4% Topical Solution Tissue Debrided: N/A N/A Fibrin/Slough, Exudates, Subcutaneous Level: N/A N/A Skin/Subcutaneous Tissue Debridement Area (sq N/A N/A 45 cm): Instrument: N/A N/A Curette Bleeding: N/A N/A Minimum Hemostasis Achieved: N/A N/A Pressure Procedural Pain: N/A N/A 0 Post Procedural Pain: N/A N/A 0 Debridement Treatment N/A N/A Procedure was tolerated Response: well Post Debridement N/A N/A 7.5x6x0.1 Measurements L x W x D (cm) Post Debridement N/A N/A 3.534 Volume: (cm) Periwound Skin Texture: Edema: Yes Edema: Yes Edema: Yes Excoriation: No Excoriation: No Excoriation: No Induration: No Induration: No Induration: No Callus: No Callus: No Callus: No Crepitus: No Crepitus: No Crepitus: No Fluctuance: No Fluctuance: No Fluctuance: No Friable: No Friable: No Friable: No Rash: No Rash: No Rash: No Scarring: No Scarring: No Scarring: No Periwound Skin Moist: Yes Moist: Yes Moist: Yes Moisture: Maceration: No Maceration: No Maceration: No Dry/Scaly: No Dry/Scaly: No Dry/Scaly: No Periwound Skin Color: Atrophie Blanche: No Atrophie Blanche: No Atrophie Blanche: No Cyanosis: No Cyanosis: No Cyanosis: No Ecchymosis: No Ecchymosis: No Ecchymosis: No Erythema: No Erythema: No Erythema: No Hemosiderin Staining: No Hemosiderin Staining: No Hemosiderin Staining: No Mottled: No Mottled:  No Mottled: No Pallor: No Pallor: No Pallor: No Rubor: No Rubor: No Rubor: No [name], Leah L. ([id number]) Temperature: No Abnormality No Abnormality No Abnormality Tenderness on No Yes Yes Palpation: Wound Preparation: Ulcer Cleansing: Ulcer Cleansing: Ulcer Cleansing: Not Rinsed/Irrigated with Rinsed/Irrigated with Cleansed Saline Saline Topical Anesthetic Topical Anesthetic Topical Anesthetic Applied: Other: lidocain Applied: Other: Lidocaine Applied: Other: lidocaine 4% 4% 4% ceam Procedures Performed: N/A N/A Debridement Wound Number: 4 N/A N/A Photos: No Photos N/A N/A Wound Location: Right Lower Leg - Midline, N/A N/A Anterior Wounding Event: Blister N/A N/A Primary Etiology: Lymphedema N/A N/A Comorbid History: Hypertension, N/A N/A Osteoarthritis Date Acquired: 03/14/2015 N/A N/A Weeks of Treatment: 1 N/A N/A Wound Status: Open N/A N/A Clustered Wound: Yes N/A N/A Measurements L x W x D 9x2.5x0.1 N/A N/A (cm) Area (cm) : 17.671 N/A N/A Volume (cm) : 1.767 N/A N/A % Reduction in Area: 6.30% N/A N/A % Reduction in Volume: 6.30% N/A N/A Classification: Partial Thickness N/A N/A Exudate Amount: Medium N/A N/A Exudate Type: Serosanguineous N/A N/A Exudate Color: red, brown N/A N/A Wound Margin: Indistinct, nonvisible N/A N/A Granulation Amount: Small (1-33%) N/A N/A Granulation Quality: Pink,  Pale N/A N/A Necrotic Amount: Medium (34-66%) N/A N/A Necrotic Tissue: Adherent Slough N/A N/A Exposed Structures: Fascia: No N/A N/A Fat: No Tendon: No Muscle: No Joint: No Bone: No Limited to Skin Breakdown Epithelialization: N/A N/A N/A Debridement: N/A N/A [name], Leah L. ([id number]) Debridement ([id number]) Time-Out Taken: Yes N/A N/A Pain Control: Lidocaine 4% Topical N/A N/A Solution Tissue Debrided: Fibrin/Slough, Exudates, N/A N/A Subcutaneous Level: Skin/Subcutaneous N/A N/A Tissue Debridement Area (sq 22.5 N/A N/A cm): Instrument:  Curette N/A N/A Bleeding: Minimum N/A N/A Hemostasis Achieved: Pressure N/A N/A Procedural Pain: 0 N/A N/A Post Procedural Pain: 0 N/A N/A Debridement Treatment Procedure was tolerated N/A N/A Response: well Post Debridement 9x2.5x0.1 N/A N/A Measurements L x W x D (cm) Post Debridement 1.767 N/A N/A Volume: (cm) Periwound Skin Texture: Edema: Yes N/A N/A Excoriation: No Induration: No Callus: No Crepitus: No Fluctuance: No Friable: No Rash: No Scarring: No Periwound Skin Moist: Yes N/A N/A Moisture: Maceration: No Dry/Scaly: No Periwound Skin Color: Atrophie Blanche: No N/A N/A Cyanosis: No Ecchymosis: No Erythema: No Hemosiderin Staining: No Mottled: No Pallor: No Rubor: No Temperature: No Abnormality N/A N/A Tenderness on Yes N/A N/A Palpation: Wound Preparation: Ulcer Cleansing: Not N/A N/A Cleansed Topical Anesthetic [name], Leah L. ([id number]) Applied: Other: lidocaine 4% Procedures Performed: Debridement N/A N/A Treatment Notes Electronic Signature(s) Signed: 03/24/2015 5:24:15 PM By: Elpidio Eric BSN, RN Entered By: Elpidio Eric on 03/24/2015 15:48:56 [name] ([id number]) -------------------------------------------------------------------------------- Multi-Disciplinary Care Plan Details Patient Name: [name] Date of Service: 03/24/2015 1:00 PM Medical Record Number: [id number] Patient Account Number: [id number] Date of Birth/Sex: 04-Feb-1913 (81 y.o. Female) Treating RN: [name], RN, BSN, [employer] Primary Care Physician: PATIENT, NO Other Clinician: Referring Physician: Marisue Ivan Treating Physician/Extender: Elayne Snare in Treatment: 3 Active Inactive Abuse / Safety / Falls / Self Care Management Nursing Diagnoses: Impaired home maintenance Impaired physical mobility Potential for falls Self care deficit: actual or potential Goals: Patient will remain injury free Date Initiated: 03/03/2015 Goal Status:  Active Patient/caregiver will verbalize understanding of skin care regimen Date Initiated: 03/03/2015 Goal Status: Active Patient/caregiver will verbalize/demonstrate measure taken to improve self care Date Initiated: 03/03/2015 Goal Status: Active Patient/caregiver will verbalize/demonstrate measures taken to improve the patient's personal safety Date Initiated: 03/03/2015 Goal Status: Active Patient/caregiver will verbalize/demonstrate measures taken to prevent injury and/or falls Date Initiated: 03/03/2015 Goal Status: Active Patient/caregiver will verbalize/demonstrate understanding of what to do in case of emergency Date Initiated: 03/03/2015 Goal Status: Active Interventions: Assess: immobility, friction, shearing, incontinence upon admission and as needed Assess impairment of mobility on admission and as needed per policy Provide education on basic hygiene Provide education on personal and home safety Provide education on safe transfers KIMIAH, HIBNER ([id number]) Treatment Activities: Education provided on Basic Hygiene : 03/10/2015 Notes: Orientation to the Wound Care Program Nursing Diagnoses: Knowledge deficit related to the wound healing center program Goals: Patient/caregiver will verbalize understanding of the Wound Healing Center Program Date Initiated: 03/03/2015 Goal Status: Active Interventions: Provide education on orientation to the wound center Notes: Venous Leg Ulcer Nursing Diagnoses: Knowledge deficit related to disease process and management Potential for venous Insuffiency (use before diagnosis confirmed) Goals: Patient will maintain optimal edema control Date Initiated: 03/03/2015 Goal Status: Active Patient/caregiver will verbalize understanding of disease process and disease management Date Initiated: 03/03/2015 Goal Status: Active Verify adequate tissue perfusion prior to therapeutic compression application Date Initiated: 03/03/2015 Goal Status:  Active Interventions: Assess peripheral edema status every visit. Compression as ordered Provide  education on venous insufficiency Treatment Activities: Test ordered outside of clinic : 03/24/2015 Notes: ABBIGAILE, ROCKMAN ([id number]) Wound/Skin Impairment Nursing Diagnoses: Impaired tissue integrity Knowledge deficit related to ulceration/compromised skin integrity Goals: Patient/caregiver will verbalize understanding of skin care regimen Date Initiated: 03/03/2015 Goal Status: Active Ulcer/skin breakdown will have a volume reduction of 30% by week 4 Date Initiated: 03/03/2015 Goal Status: Active Ulcer/skin breakdown will have a volume reduction of 50% by week 8 Date Initiated: 03/03/2015 Goal Status: Active Ulcer/skin breakdown will have a volume reduction of 80% by week 12 Date Initiated: 03/03/2015 Goal Status: Active Ulcer/skin breakdown will heal within 14 weeks Date Initiated: 03/03/2015 Goal Status: Active Interventions: Assess patient/caregiver ability to perform ulcer/skin care regimen upon admission and as needed Assess ulceration(s) every visit Provide education on ulcer and skin care Notes: Electronic Signature(s) Signed: 03/24/2015 5:24:15 PM By: Elpidio Eric BSN, RN Entered By: Elpidio Eric on 03/24/2015 15:48:39 Craver, [name] ([id number]) -------------------------------------------------------------------------------- Pain Assessment Details Patient Name: [name] Date of Service: 03/24/2015 1:00 PM Medical Record Number: [id number] Patient Account Number: [id number] Date of Birth/Sex: 10-01-1912 (81 y.o. Female) Treating RN: Clover Mealy, RN, BSN, [place] [employer] Primary Care Physician: PATIENT, NO Other Clinician: Referring Physician: Marisue Ivan Treating Physician/Extender: Rudene Re in Treatment: 3 Active Problems Location of Pain Severity and Description of Pain Patient Has Paino No Site Locations Pain Management and Medication Current Pain  Management: Electronic Signature(s) Signed: 03/24/2015 5:24:15 PM By: Elpidio Eric BSN, RN Entered By: Elpidio Eric on 03/24/2015 13:07:41 [name] ([id number]) -------------------------------------------------------------------------------- Patient/Caregiver Education Details Patient Name: [name] Date of Service: 03/24/2015 1:00 PM Medical Record Number: [id number] Patient Account Number: [id number] Date of Birth/Gender: Jul 27, 1913 (81 y.o. Female) Treating RN: Primary Care Physician: PATIENT, NO Other Clinician: Referring Physician: Marisue Ivan Treating Physician/Extender: Elayne Snare in Treatment: 3 Education Assessment Education Provided To: Patient Education Topics Provided Basic Hygiene: Methods: Explain/Verbal Responses: State content correctly Safety: Methods: Explain/Verbal Responses: State content correctly Electronic Signature(s) Signed: 03/24/2015 5:24:15 PM By: Elpidio Eric BSN, RN Previous Signature: 03/24/2015 2:21:08 PM Version By: Ardath Sax MD Entered By: Elpidio Eric on 03/24/2015 15:52:34 Mastandrea, [name] ([id number]) -------------------------------------------------------------------------------- Wound Assessment Details Patient Name: [name] Date of Service: 03/24/2015 1:00 PM Medical Record Number: [id number] Patient Account Number: [id number] Date of Birth/Sex: 06-Jun-1913 (81 y.o. Female) Treating RN: [name], RN, BSN, Psychologist, clinical Primary Care Physician: PATIENT, NO Other Clinician: Referring Physician: Marisue Ivan Treating Physician/Extender: Rudene Re in Treatment: 3 Wound Status Wound Number: 1 Primary Etiology: Skin Tear Wound Location: Left Lower Leg - Anterior Wound Status: Open Wounding Event: Trauma Comorbid History: Hypertension, Osteoarthritis Date Acquired: 01/31/2015 Weeks Of Treatment: 3 Clustered Wound: No Photos Photo Uploaded By: Elpidio Eric on 03/24/2015 17:17:56 Wound  Measurements Length: (cm) 1.8 Width: (cm) 2 Depth: (cm) 0.1 Area: (cm) 2.827 Volume: (cm) 0.283 % Reduction in Area: 92.1% % Reduction in Volume: 97.4% Epithelialization: Small (1-33%) Tunneling: No Undermining: No Wound Description Full Thickness Without Exposed Classification: Support Structures Wound Margin: Distinct, outline attached Exudate Medium Amount: Exudate Type: Serosanguineous Exudate Color: red, brown Foul Odor After Cleansing: No Wound Bed Granulation Amount: Small (1-33%) Exposed Structure Granulation Quality: Pink, Pale Fascia Exposed: No Necrotic Amount: Large (67-100%) Fat Layer Exposed: No Wyre, Amorina L. ([id number]) Necrotic Quality: Eschar, Adherent Slough Tendon Exposed: No Muscle Exposed: No Joint Exposed: No Bone Exposed: No Limited to Skin Breakdown Periwound Skin Texture Texture Color No Abnormalities Noted: No No Abnormalities Noted: No Callus: No Atrophie Blanche: No  Crepitus: No Cyanosis: No Excoriation: No Ecchymosis: No Fluctuance: No Erythema: No Friable: No Hemosiderin Staining: No Induration: No Mottled: No Localized Edema: Yes Pallor: No Rash: No Rubor: No Scarring: No Temperature / Pain Moisture Temperature: No Abnormality No Abnormalities Noted: No Dry / Scaly: No Maceration: No Moist: Yes Wound Preparation Ulcer Cleansing: Rinsed/Irrigated with Saline Topical Anesthetic Applied: Other: Lidocaine 4%, Treatment Notes Wound #1 (Left, Anterior Lower Leg) 1. Cleansed with: Clean wound with Normal Saline 4. Dressing Applied: Xeroform 5. Secondary Dressing Applied Guaze, ABD and kerlix/Conform 7. Secured with Tape Tubigrip Electronic Signature(s) Signed: 03/24/2015 5:24:15 PM By: Elpidio Eric BSN, RN Entered By: Elpidio Eric on 03/24/2015 13:19:10 Corales, [name] ([id number]) -------------------------------------------------------------------------------- Wound Assessment Details Patient Name: [name] Date of Service: 03/24/2015 1:00 PM Medical Record Number: [id number] Patient Account Number: [id number] Date of Birth/Sex: [birth year] (81 y.o. Female) Treating RN: [name], RN, BSN, Psychologist, clinical Primary Care Physician: PATIENT, NO Other Clinician: Referring Physician: Marisue Ivan Treating Physician/Extender: Rudene Re in Treatment: 3 Wound Status Wound Number: 2 Primary Etiology: Skin Tear Wound Location: Right Lower Leg - Anterior Wound Status: Open Wounding Event: Trauma Comorbid History: Hypertension, Osteoarthritis Date Acquired: 01/31/2015 Weeks Of Treatment: 3 Clustered Wound: No Photos Photo Uploaded By: Elpidio Eric on 03/24/2015 17:17:57 Wound Measurements Length: (cm) 3.3 Width: (cm) 2 Depth: (cm) 0.1 Area: (cm) 5.184 Volume: (cm) 0.518 % Reduction in Area: -3.1% % Reduction in Volume: 65.6% Epithelialization: Small (1-33%) Tunneling: No Undermining: No Wound Description Full Thickness Without Exposed Classification: Support Structures Wound Margin: Distinct, outline attached Exudate Medium Amount: Exudate Type: Serosanguineous Exudate Color: red, brown Foul Odor After Cleansing: No Wound Bed Granulation Amount: None Present (0%) Exposed Structure Necrotic Amount: Large (67-100%) Fascia Exposed: No Necrotic Quality: Adherent Slough Fat Layer Exposed: No [name], Leah L. ([id number]) Tendon Exposed: No Muscle Exposed: No Joint Exposed: No Bone Exposed: No Limited to Skin Breakdown Periwound Skin Texture Texture Color No Abnormalities Noted: No No Abnormalities Noted: No Callus: No Atrophie Blanche: No Crepitus: No Cyanosis: No Excoriation: No Ecchymosis: No Fluctuance: No Erythema: No Friable: No Hemosiderin Staining: No Induration: No Mottled: No Localized Edema: Yes Pallor: No Rash: No Rubor: No Scarring: No Temperature / Pain Moisture Temperature: No Abnormality No Abnormalities Noted: No Tenderness on  Palpation: Yes Dry / Scaly: No Maceration: No Moist: Yes Wound Preparation Ulcer Cleansing: Rinsed/Irrigated with Saline Topical Anesthetic Applied: Other: lidocaine 4% ceam, Treatment Notes Wound #2 (Right, Anterior Lower Leg) 1. Cleansed with: Clean wound with Normal Saline 4. Dressing Applied: Xeroform 5. Secondary Dressing Applied Guaze, ABD and kerlix/Conform 7. Secured with Tape Tubigrip Electronic Signature(s) Signed: 03/24/2015 5:24:15 PM By: Elpidio Eric BSN, RN Entered By: Elpidio Eric on 03/24/2015 13:19:22 Jiggetts, [name] ([id number]) -------------------------------------------------------------------------------- Wound Assessment Details Patient Name: [name] Date of Service: 03/24/2015 1:00 PM Medical Record Number: [id number] Patient Account Number: [id number] Date of Birth/Sex: 07/28/1913 (81 y.o. Female) Treating RN: [name], RN, BSN, Psychologist, clinical Primary Care Physician: PATIENT, NO Other Clinician: Referring Physician: Marisue Ivan Treating Physician/Extender: Rudene Re in Treatment: 3 Wound Status Wound Number: 3 Primary Etiology: Lymphedema Wound Location: Left Lower Leg - Midline, Wound Status: Open Anterior Comorbid History: Hypertension, Osteoarthritis Wounding Event: Blister Date Acquired: 03/14/2015 Weeks Of Treatment: 1 Clustered Wound: No Photos Photo Uploaded By: Elpidio Eric on 03/24/2015 17:17:57 Wound Measurements Length: (cm) 7.5 Width: (cm) 6 Depth: (cm) 0.4 Area: (cm) 35.343 Volume: (cm) 14.137 % Reduction in Area: -650.1% % Reduction in Volume: -2901.5% Tunneling: No Undermining:  No Wound Description Classification: Partial Thickness Wound Margin: Indistinct, nonvisible Exudate Amount: Medium Exudate Type: Serosanguineous Exudate Color: red, brown Foul Odor After Cleansing: No Wound Bed Granulation Amount: Small (1-33%) Exposed Structure Granulation Quality: Pink, Pale Fascia Exposed: No Necrotic  Amount: Medium (34-66%) Fat Layer Exposed: No [name], Leah L. ([id number]) Necrotic Quality: Adherent Slough Tendon Exposed: No Muscle Exposed: No Joint Exposed: No Bone Exposed: No Limited to Skin Breakdown Periwound Skin Texture Texture Color No Abnormalities Noted: No No Abnormalities Noted: No Callus: No Atrophie Blanche: No Crepitus: No Cyanosis: No Excoriation: No Ecchymosis: No Fluctuance: No Erythema: No Friable: No Hemosiderin Staining: No Induration: No Mottled: No Localized Edema: Yes Pallor: No Rash: No Rubor: No Scarring: No Temperature / Pain Moisture Temperature: No Abnormality No Abnormalities Noted: No Tenderness on Palpation: Yes Dry / Scaly: No Maceration: No Moist: Yes Wound Preparation Ulcer Cleansing: Not Cleansed Topical Anesthetic Applied: Other: lidocain 4%, Treatment Notes Wound #3 (Left, Midline, Anterior Lower Leg) 1. Cleansed with: Clean wound with Normal Saline 4. Dressing Applied: Santyl Ointment 5. Secondary Dressing Applied Gauze and Kerlix/Conform 7. Secured with Tape Tubigrip Electronic Signature(s) Signed: 03/24/2015 5:24:15 PM By: Elpidio Eric BSN, RN Entered By: Elpidio Eric on 03/24/2015 13:20:44 Misuraca, [name] ([id number]) -------------------------------------------------------------------------------- Wound Assessment Details Patient Name: [name] Date of Service: 03/24/2015 1:00 PM Medical Record Number: [id number] Patient Account Number: [id number] Date of Birth/Sex: 1913/06/10 (81 y.o. Female) Treating RN: [name], RN, BSN, Psychologist, clinical Primary Care Physician: PATIENT, NO Other Clinician: Referring Physician: Marisue Ivan Treating Physician/Extender: Rudene Re in Treatment: 3 Wound Status Wound Number: 4 Primary Etiology: Lymphedema Wound Location: Right Lower Leg - Midline, Wound Status: Open Anterior Comorbid History: Hypertension, Osteoarthritis Wounding Event: Blister Date Acquired:  03/14/2015 Weeks Of Treatment: 1 Clustered Wound: Yes Photos Photo Uploaded By: Elpidio Eric on 03/24/2015 17:20:16 Wound Measurements Length: (cm) 9 Width: (cm) 2.5 Depth: (cm) 0.1 Area: (cm) 17.671 Volume: (cm) 1.767 % Reduction in Area: 6.3% % Reduction in Volume: 6.3% Tunneling: No Undermining: No Wound Description Classification: Partial Thickness Wound Margin: Indistinct, nonvisible Exudate Amount: Medium Exudate Type: Serosanguineous Exudate Color: red, brown Foul Odor After Cleansing: No Wound Bed Granulation Amount: Small (1-33%) Exposed Structure Granulation Quality: Pink, Pale Fascia Exposed: No Necrotic Amount: Medium (34-66%) Fat Layer Exposed: No [name], Leah L. ([id number]) Necrotic Quality: Adherent Slough Tendon Exposed: No Muscle Exposed: No Joint Exposed: No Bone Exposed: No Limited to Skin Breakdown Periwound Skin Texture Texture Color No Abnormalities Noted: No No Abnormalities Noted: No Callus: No Atrophie Blanche: No Crepitus: No Cyanosis: No Excoriation: No Ecchymosis: No Fluctuance: No Erythema: No Friable: No Hemosiderin Staining: No Induration: No Mottled: No Localized Edema: Yes Pallor: No Rash: No Rubor: No Scarring: No Temperature / Pain Moisture Temperature: No Abnormality No Abnormalities Noted: No Tenderness on Palpation: Yes Dry / Scaly: No Maceration: No Moist: Yes Wound Preparation Ulcer Cleansing: Not Cleansed Topical Anesthetic Applied: Other: lidocaine 4%, Treatment Notes Wound #4 (Right, Midline, Anterior Lower Leg) 1. Cleansed with: Clean wound with Normal Saline 4. Dressing Applied: Santyl Ointment 5. Secondary Dressing Applied Gauze and Kerlix/Conform 7. Secured with Tape Tubigrip Electronic Signature(s) Signed: 03/24/2015 5:24:15 PM By: Elpidio Eric BSN, RN Entered By: Elpidio Eric on 03/24/2015 13:21:24 Mast, [name]  ([id number]) -------------------------------------------------------------------------------- Vitals Details Patient Name: [name] Date of Service: 03/24/2015 1:00 PM Medical Record Number: [id number] Patient Account Number: [id number] Date of Birth/Sex: 1913-07-27 (81 y.o. Female) Treating RN: [name], RN, BSN, [facility] Primary Care Physician: PATIENT, NO Other  Clinician: Referring Physician: Marisue Ivan Treating Physician/Extender: Rudene Re in Treatment: 3 Vital Signs Time Taken: 13:07 Respiratory Rate (breaths/min): 18 Height (in): 64 Reference Range: 80 - 120 mg / dl Weight (lbs): 161 Body Mass Index (BMI): 25.2 Electronic Signature(s) Signed: 03/24/2015 5:24:15 PM By: Elpidio Eric BSN, RN Entered By: Elpidio Eric on 03/24/2015 13:09:09

## 2015-03-28 NOTE — Progress Notes (Addendum)
Leah, Yu ([id number]) Visit Report for 03/24/2015 Chief Complaint Document Details Patient Name: [name], [name] 03/24/2015 1:00 Date of Service: PM Medical Record [id number] Number: Patient Account Number: [id number] 13-Nov-1912 (81 y.o. Treating RN: Date of Birth/Sex: Female) Other Clinician: Primary Care Physician: PATIENT, NO [name] Referring Physician: Marisue Yu Physician/Extender: Weeks in Treatment: 3 Information Obtained from: Patient Chief Complaint Patient seen for complaints of Non-Healing Wound. pleasant 81 year old patient who had a fall and injured both lower extremities on 01/31/2015 Electronic Signature(s) Signed: 03/24/2015 2:16:08 PM By: Leah Sax MD Previous Signature: 03/24/2015 2:15:31 PM Version By: Leah Sax MD Entered By: [name] on 03/24/2015 14:16:07 Mccamish, [name] ([id number]) -------------------------------------------------------------------------------- Debridement Details Patient Name: [name] Date of Service: 03/24/2015 1:00 PM Medical Record Patient Account Number: [id number] Number: [name], Treating RN: 03/30/1913 (81 y.o. [name] Date of Birth/Sex: Female) Other Clinician: Primary Care Physician: PATIENT, NO [name] Referring Physician: Marisue Yu Physician/Extender: Weeks in Treatment: 3 Debridement Performed for Wound #4 Right,Midline,Anterior Lower Leg Assessment: Performed By: Physician [name]., MD Debridement: Debridement Pre-procedure Yes Verification/Time Out Taken: Start Time: 13:33 Pain Control: Lidocaine 4% Topical Solution Level: Skin/Subcutaneous Tissue Total Area Debrided (L x 9 (cm) x 2.5 (cm) = 22.5 (cm) W): Tissue and other Non-Viable, Exudate, Fibrin/Slough, Subcutaneous material debrided: Instrument: Curette Bleeding: Minimum Hemostasis Achieved: Pressure End Time: 13:36 Procedural Pain: 0 Post Procedural  Pain: 0 Response to Treatment: Procedure was tolerated well Post Debridement Measurements of Total Wound Length: (cm) 9 Width: (cm) 2.5 Depth: (cm) 0.1 Volume: (cm) 1.767 Electronic Signature(s) Signed: 03/24/2015 5:24:15 PM By: [name] BSN, RN Signed: 03/28/2015 12:31:06 PM By: Leah Kanner MD, FACS Entered By: [name] on 03/24/2015 13:35:39 Jobe, [name] ([id number]) -------------------------------------------------------------------------------- Debridement Details Patient Name: [name] Date of Service: 03/24/2015 1:00 PM Medical Record Patient Account Number: [id number] Number: [name], Treating RN: 1913-06-19 (81 y.o. [name] Date of Birth/Sex: Female) Other Clinician: Primary Care Physician: PATIENT, NO [name] Referring Physician: Marisue Yu Physician/Extender: Weeks in Treatment: 3 Debridement Performed for Wound #3 Left,Midline,Anterior Lower Leg Assessment: Performed By: Physician [name]., MD Debridement: Debridement Pre-procedure Yes Verification/Time Out Taken: Start Time: 13:36 Pain Control: Lidocaine 4% Topical Solution Level: Skin/Subcutaneous Tissue Total Area Debrided (L x 7.5 (cm) x 6 (cm) = 45 (cm) W): Tissue and other Non-Viable, Exudate, Fibrin/Slough, Subcutaneous material debrided: Instrument: Curette Bleeding: Minimum Hemostasis Achieved: Pressure End Time: 13:40 Procedural Pain: 0 Post Procedural Pain: 0 Response to Treatment: Procedure was tolerated well Post Debridement Measurements of Total Wound Length: (cm) 7.5 Width: (cm) 6 Depth: (cm) 0.1 Volume: (cm) 3.534 Electronic Signature(s) Signed: 03/24/2015 5:24:15 PM By: [name] BSN, RN Signed: 03/28/2015 12:31:06 PM By: Leah Kanner MD, FACS Entered By: [name] on 03/24/2015 13:36:33 [name], [name] ([id number]) -------------------------------------------------------------------------------- HPI  Details Patient Name: Leah Deed L. 03/24/2015 1:00 Date of Service: PM Medical Record [id number] Number: Patient Account Number: [id number] October 25, 1912 (81 y.o. Treating RN: Date of Birth/Sex: Female) Other Clinician: Primary Care Physician: PATIENT, NO [name] Referring Physician: Marisue Yu Physician/Extender: Weeks in Treatment: 3 History of Present Illness Location: both lower extremities Quality: Patient reports experiencing a dull pain to affected area(s). Severity: Patient states wound (s) are getting better. Duration: Patient has had the wound for < 4 weeks prior to presenting for treatment Timing: Pain in wound is Intermittent (comes and goes Context: The wound occurred when the patient had a fall and  lacerated both lower extremities. Modifying Factors: Other treatment(s) tried include:she had sutures applied there for a while and when they were removed the wound opened up. Associated Signs and Symptoms: Patient reports having difficulty standing for long periods. HPI Description: 81 year old female who fell and had an injury to her right forehead, right elbow and both shins on 01/31/2015. She went to the ER at [facility] and had been treated appropriately with sutures being placed to her right and left lower extremity. Also had a CT scan which showed no intracranial hemorrhage and only a large scalp hematoma on the right frontal region.she was put on Keflex for 5 days. she had also received Bactrim for 14 days.Since then she has been seen by her PCP and he had put her on Augmentin for 14 days and wound culture was obtained. culture reports were reviewed -- she grew an MRSA sensitive to tetracycline, Bactrim and vancomycin. Her past medical history significant for osteoporosis, hypertension, spinal stenosis, right hip replacement, appendectomy and abdomen hysterectomy for ovarian cancer. She has been seen in the past by the vascular surgery  group at [facility] and she has an appointment to see them again. She is known to use lymphedema pumps while she has been in her assisted living home but has not been using them for the last month. She does also have compression stockings which she uses intermittently. 03/17/2015 -- She was seen by Dr. Gilda Crease on 03/03/2015 -- he reviewed the patientos case and recommended no surgical intervention. He has recommended compression stockings of the 20-30 mmHg variety and also recommended lymphedema pumps. He will see her back in 3 months. The patient has developed some blisters on both lower extremities and this may be due to the fact that she's had a Kerlix gauze wrap on both lower extremities and is also using the lymphedema pumps. Electronic Signature(s) Signed: 03/24/2015 2:16:22 PM By: Leah Sax MD Entered By: [name] on 03/24/2015 14:16:22 [name], [name] ([id number]) -------------------------------------------------------------------------------- Physical Exam Details Patient Name: [name], [name] 03/24/2015 1:00 Date of Service: PM Medical Record [id number] Number: Patient Account Number: [id number] [birth year] (81 y.o. Treating RN: Date of Birth/Sex: Female) Other Clinician: Primary Care Physician: PATIENT, NO Treating Bunnie Lederman Referring Physician: Marisue Yu Physician/Extender: Weeks in Treatment: 3 Electronic Signature(s) Signed: 03/24/2015 2:16:31 PM By: Leah Sax MD Entered By: [name] on 03/24/2015 14:16:31 Hazelrigg, [name] ([id number]) -------------------------------------------------------------------------------- Physician Orders Details Patient Name: [name] Date of Service: 03/24/2015 1:00 PM Medical Record Patient Account Number: [id number] Number: [name], Treating RN: 1913/05/22 (81 y.o. [place] Yu Date of Birth/Sex: Female) Other Clinician: Primary Care Physician: PATIENT, NO Treating Ashanta Amoroso,  Ugochukwu Chichester Referring Physician: Marisue Yu Physician/Extender: Tania Ade in Treatment: 3 Verbal / Phone Orders: Yes Clinician: [name], RN, Yu, Rita Read Back and Verified: Yes Diagnosis Coding Wound Cleansing Wound #1 Left,Anterior Lower Leg o Cleanse wound with mild soap and water o May Shower, gently pat wound dry prior to applying new dressing. o May shower with protection. Wound #2 Right,Anterior Lower Leg o Cleanse wound with mild soap and water o May Shower, gently pat wound dry prior to applying new dressing. o May shower with protection. Wound #3 Left,Midline,Anterior Lower Leg o Cleanse wound with mild soap and water o May Shower, gently pat wound dry prior to applying new dressing. o May shower with protection. Wound #4 Right,Midline,Anterior Lower Leg o Cleanse wound with mild soap and water o May Shower, gently pat wound dry prior to applying new  dressing. o May shower with protection. Anesthetic Wound #1 Left,Anterior Lower Leg o Topical Lidocaine 4% cream applied to wound bed prior to debridement Wound #2 Right,Anterior Lower Leg o Topical Lidocaine 4% cream applied to wound bed prior to debridement Wound #3 Left,Midline,Anterior Lower Leg o Topical Lidocaine 4% cream applied to wound bed prior to debridement Wound #4 Right,Midline,Anterior Lower Leg o Topical Lidocaine 4% cream applied to wound bed prior to debridement Skin Barriers/Peri-Wound Care Wible, Shyann L. ([id number]) Wound #1 Left,Anterior Lower Leg o Skin Prep Wound #2 Right,Anterior Lower Leg o Skin Prep Wound #3 Left,Midline,Anterior Lower Leg o Skin Prep Wound #4 Right,Midline,Anterior Lower Leg o Skin Prep Primary Wound Dressing Wound #1 Left,Anterior Lower Leg o Santyl Ointment Wound #2 Right,Anterior Lower Leg o Santyl Ointment Wound #3 Left,Midline,Anterior Lower Leg o Xeroform Wound #4 Right,Midline,Anterior Lower Leg o  Xeroform Secondary Dressing Wound #1 Left,Anterior Lower Leg o Gauze and Kerlix/Conform - conform please Wound #2 Right,Anterior Lower Leg o Gauze and Kerlix/Conform - conform please Wound #3 Left,Midline,Anterior Lower Leg o Gauze and Kerlix/Conform - conform please Wound #4 Right,Midline,Anterior Lower Leg o Gauze and Kerlix/Conform - conform please Dressing Change Frequency Wound #1 Left,Anterior Lower Leg o Change dressing every day. Wound #2 Right,Anterior Lower Leg o Change dressing every day. Wound #3 Left,Midline,Anterior Lower Leg o Change dressing every day. [name], Leah L. ([id number]) Wound #4 Right,Midline,Anterior Lower Leg o Change dressing every day. Follow-up Appointments Wound #1 Left,Anterior Lower Leg o Return Appointment in 1 week. Wound #2 Right,Anterior Lower Leg o Return Appointment in 1 week. Wound #3 Left,Midline,Anterior Lower Leg o Return Appointment in 1 week. Wound #4 Right,Midline,Anterior Lower Leg o Return Appointment in 1 week. Edema Control Wound #1 Left,Anterior Lower Leg o Patient to wear own compression stockings Wound #2 Right,Anterior Lower Leg o Patient to wear own compression stockings Wound #3 Left,Midline,Anterior Lower Leg o Patient to wear own compression stockings Wound #4 Right,Midline,Anterior Lower Leg o Patient to wear own compression stockings Home Health Wound #1 Left,Anterior Lower Leg o Continue Home Health Visits o Home Health Nurse may visit PRN to address patientos wound care needs. o FACE TO FACE ENCOUNTER: MEDICARE and MEDICAID PATIENTS: I certify that this patient is under my care and that I had a face-to-face encounter that meets the physician face-to-face encounter requirements with this patient on this date. The encounter with the patient was in whole or in part for the following MEDICAL CONDITION: (primary reason for Home Healthcare) MEDICAL NECESSITY: I certify, that  based on my findings, NURSING services are a medically necessary home health service. HOME BOUND STATUS: I certify that my clinical findings support that this patient is homebound (i.e., Due to illness or injury, pt requires aid of supportive devices such as crutches, cane, wheelchairs, walkers, the use of special transportation or the assistance of another person to leave their place of residence. There is a normal inability to leave the home and doing so requires considerable and taxing effort. Other absences are for medical reasons / religious services and are infrequent or of short duration when for other reasons). [name], Leah L. ([id number]) o If current dressing causes regression in wound condition, may D/C ordered dressing product/s and apply Normal Saline Moist Dressing daily until next Wound Healing Center / Other MD appointment. Notify Wound Healing Center of regression in wound condition at [phone]. o Please direct any NON-WOUND related issues/requests for orders to patient's Primary Care Physician Wound #2 Right,Anterior Lower Leg o Continue Home Health Visits   o Home Health Nurse may visit PRN to address patientos wound care needs. o FACE TO FACE ENCOUNTER: MEDICARE and MEDICAID PATIENTS: I certify that this patient is under my care and that I had a face-to-face encounter that meets the physician face-to-face encounter requirements with this patient on this date. The encounter with the patient was in whole or in part for the following MEDICAL CONDITION: (primary reason for Home Healthcare) MEDICAL NECESSITY: I certify, that based on my findings, NURSING services are a medically necessary home health service. HOME BOUND STATUS: I certify that my clinical findings support that this patient is homebound (i.e., Due to illness or injury, pt requires aid of supportive devices such as crutches, cane, wheelchairs, walkers, the use of special transportation or the  assistance of another person to leave their place of residence. There is a normal inability to leave the home and doing so requires considerable and taxing effort. Other absences are for medical reasons / religious services and are infrequent or of short duration when for other reasons). o If current dressing causes regression in wound condition, may D/C ordered dressing product/s and apply Normal Saline Moist Dressing daily until next Wound Healing Center / Other MD appointment. Notify Wound Healing Center of regression in wound condition at [phone]. o Please direct any NON-WOUND related issues/requests for orders to patient's Primary Care Physician Wound #3 Left,Midline,Anterior Lower Leg o Continue Home Health Visits o Home Health Nurse may visit PRN to address patientos wound care needs. o FACE TO FACE ENCOUNTER: MEDICARE and MEDICAID PATIENTS: I certify that this patient is under my care and that I had a face-to-face encounter that meets the physician face-to-face encounter requirements with this patient on this date. The encounter with the patient was in whole or in part for the following MEDICAL CONDITION: (primary reason for Home Healthcare) MEDICAL NECESSITY: I certify, that based on my findings, NURSING services are a medically necessary home health service. HOME BOUND STATUS: I certify that my clinical findings support that this patient is homebound (i.e., Due to illness or injury, pt requires aid of supportive devices such as crutches, cane, wheelchairs, walkers, the use of special transportation or the assistance of another person to leave their place of residence. There is a normal inability to leave the home and doing so requires considerable and taxing effort. Other absences are for medical reasons / religious services and are infrequent or of short duration when for other reasons). o If current dressing causes regression in wound condition, may D/C ordered  dressing product/s and apply Normal Saline Moist Dressing daily until next Wound Healing Center / Other MD appointment. Notify Wound Healing Center of regression in wound condition at [phone]. o Please direct any NON-WOUND related issues/requests for orders to patient's Primary Care Physician Wound #4 Right,Midline,Anterior Lower Leg o Continue Home Health Visits LEANOR, VORIS ([id number]) o Home Health Nurse may visit PRN to address patientos wound care needs. o FACE TO FACE ENCOUNTER: MEDICARE and MEDICAID PATIENTS: I certify that this patient is under my care and that I had a face-to-face encounter that meets the physician face-to-face encounter requirements with this patient on this date. The encounter with the patient was in whole or in part for the following MEDICAL CONDITION: (primary reason for Home Healthcare) MEDICAL NECESSITY: I certify, that based on my findings, NURSING services are a medically necessary home health service. HOME BOUND STATUS: I certify that my clinical findings support that this patient is homebound (i.e., Due to illness or  injury, pt requires aid of supportive devices such as crutches, cane, wheelchairs, walkers, the use of special transportation or the assistance of another person to leave their place of residence. There is a normal inability to leave the home and doing so requires considerable and taxing effort. Other absences are for medical reasons / religious services and are infrequent or of short duration when for other reasons). o If current dressing causes regression in wound condition, may D/C ordered dressing product/s and apply Normal Saline Moist Dressing daily until next Wound Healing Center / Other MD appointment. Notify Wound Healing Center of regression in wound condition at [phone]. o Please direct any NON-WOUND related issues/requests for orders to patient's Primary Care Physician Electronic Signature(s) Signed:  03/24/2015 5:24:15 PM By: [name] BSN, RN Previous Signature: 03/24/2015 2:01:39 PM Version By: [name] BSN, RN Previous Signature: 03/24/2015 2:00:38 PM Version By: [name] BSN, RN Entered By: [name] on 03/24/2015 15:49:42 Dirocco, [name] ([id number]) -------------------------------------------------------------------------------- Problem List Details Patient Name: TIAUNNA, BUFORD 03/24/2015 1:00 Date of Service: PM Medical Record [id number] Number: Patient Account Number: [id number] [birth year] (81 y.o. 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 03/24/2015 1:00 Date of Service: PM Medical Record [id number] Number: Patient Account Number: [id number] [birth year] (81 y.o. Treating RN: Date of Birth/Sex: Female) Other Clinician: Primary Care Physician: PATIENT, NO Treating Paulmichael Schreck Referring Physician: Marisue Yu Physician/Extender: Weeks in Treatment: 3 Subjective Chief Complaint Information obtained from Patient Patient seen for complaints of Non-Healing Wound. pleasant 81 year old patient who had a fall and  injured both lower extremities on 01/31/2015 History of Present Illness (HPI) The following HPI elements were documented for the patient's wound: Location: both lower extremities Quality: Patient reports experiencing a dull pain to affected area(s). Severity: Patient states wound (s) are getting better. Duration: Patient has had the wound for < 4 weeks prior to presenting for treatment Timing: Pain in wound is Intermittent (comes and goes Context: The wound occurred when the patient had a fall and lacerated both lower extremities. Modifying Factors: Other treatment(s) tried include:she had sutures applied there for a while and when they were removed the wound opened up. Associated Signs and Symptoms: Patient reports having difficulty standing for long periods. 81 year old female who fell and had an injury to her right forehead, right elbow and both shins on 01/31/2015. She went to the ER at [facility] and had been treated appropriately with sutures being placed to her right and left lower extremity. Also had a CT scan which showed no intracranial hemorrhage and only a large scalp hematoma on the right frontal region.she was put on Keflex for 5 days. she had also received Bactrim for 14 days.Since then she has been seen by her PCP and he had put her on Augmentin for 14 days and wound culture was obtained. culture reports were reviewed -- she grew an MRSA sensitive to tetracycline, Bactrim  and vancomycin. Her past medical history significant for osteoporosis, hypertension, spinal stenosis, right hip replacement, appendectomy and abdomen hysterectomy for ovarian cancer. She has been seen in the past by the vascular surgery group at [facility] and she has an appointment to see them again. She is known to use lymphedema pumps while she has been in her assisted living home but has not been using them for the last month. She does also have compression stockings which she uses  intermittently. 03/17/2015 -- She was seen by Dr. Gilda Crease on 03/03/2015 -- he reviewed the patient s case and recommended no surgical intervention. He has recommended compression stockings of the 20-30 mmHg variety and also recommended lymphedema pumps. He will see her back in 3 months. The patient has developed some blisters on both lower extremities and this may be due to the fact that Prosser, Calle L. ([id number]) she's had a Kerlix gauze wrap on both lower extremities and is also using the lymphedema pumps. Objective Constitutional Vitals Time Taken: 1:07 PM, Height: 64 in, Weight: 147 lbs, BMI: 25.2, Respiratory Rate: 18 breaths/min. Integumentary (Hair, Skin) Wound #1 status is Open. Original cause of wound was Trauma. The wound is located on the Left,Anterior Lower Leg. The wound measures 1.8cm length x 2cm width x 0.1cm depth; 2.827cm^2 area and 0.283cm^3 volume. The wound is limited to skin breakdown. There is no tunneling or undermining noted. There is a medium amount of serosanguineous drainage noted. The wound margin is distinct with the outline attached to the wound base. There is small (1-33%) pink, pale granulation within the wound bed. There is a large (67-100%) amount of necrotic tissue within the wound bed including Eschar and Adherent Slough. The periwound skin appearance exhibited: Localized Edema, Moist. The periwound skin appearance did not exhibit: Callus, Crepitus, Excoriation, Fluctuance, Friable, Induration, Rash, Scarring, Dry/Scaly, Maceration, Atrophie Blanche, Cyanosis, Ecchymosis, Hemosiderin Staining, Mottled, Pallor, Rubor, Erythema. Periwound temperature was noted as No Abnormality. Wound #2 status is Open. Original cause of wound was Trauma. The wound is located on the Right,Anterior Lower Leg. The wound measures 3.3cm length x 2cm width x 0.1cm depth; 5.184cm^2 area and 0.518cm^3 volume. The wound is limited to skin breakdown. There is no tunneling or  undermining noted. There is a medium amount of serosanguineous drainage noted. The wound margin is distinct with the outline attached to the wound base. There is no granulation within the wound bed. There is a large (67- 100%) amount of necrotic tissue within the wound bed including Adherent Slough. The periwound skin appearance exhibited: Localized Edema, Moist. The periwound skin appearance did not exhibit: Callus, Crepitus, Excoriation, Fluctuance, Friable, Induration, Rash, Scarring, Dry/Scaly, Maceration, Atrophie Blanche, Cyanosis, Ecchymosis, Hemosiderin Staining, Mottled, Pallor, Rubor, Erythema. Periwound temperature was noted as No Abnormality. The periwound has tenderness on palpation. Wound #3 status is Open. Original cause of wound was Blister. The wound is located on the Right,Midline,Anterior Lower Leg. The wound measures 7.5cm length x 6cm width x 0.4cm depth; 35.343cm^2 area and 14.137cm^3 volume. The wound is limited to skin breakdown. There is no tunneling or undermining noted. There is a medium amount of serosanguineous drainage noted. The wound margin is indistinct and nonvisible. There is small (1-33%) pink, pale granulation within the wound bed. There is a medium (34-66%) amount of necrotic tissue within the wound bed including Adherent Slough. The periwound skin appearance exhibited: Localized Edema, Moist. The periwound skin appearance did not exhibit: Callus, Crepitus, Excoriation, Fluctuance, Friable, Induration, Rash, Scarring, Dry/Scaly, Maceration, Atrophie Blanche, Cyanosis,  Ecchymosis, Hemosiderin Staining, Mottled, Pallor, Rubor, Erythema. Periwound temperature was noted as No Abnormality. The periwound has tenderness on palpation. Wound #4 status is Open. Original cause of wound was Blister. The wound is located on the Left,Midline,Anterior Lower Leg. The wound measures 9cm length x 2.5cm width x 0.1cm depth; [name], Leah L. ([id number]) 17.671cm^2 area and  1.767cm^3 volume. The wound is limited to skin breakdown. There is no tunneling or undermining noted. There is a medium amount of serosanguineous drainage noted. The wound margin is indistinct and nonvisible. There is small (1-33%) pink, pale granulation within the wound bed. There is a medium (34-66%) amount of necrotic tissue within the wound bed including Adherent Slough. The periwound skin appearance exhibited: Localized Edema, Moist. The periwound skin appearance did not exhibit: Callus, Crepitus, Excoriation, Fluctuance, Friable, Induration, Rash, Scarring, Dry/Scaly, Maceration, Atrophie Blanche, Cyanosis, Ecchymosis, Hemosiderin Staining, Mottled, Pallor, Rubor, Erythema. Periwound temperature was noted as No Abnormality. The periwound has tenderness on palpation. Assessment Active Problems ICD-10 L97.212 - Non-pressure chronic ulcer of right calf with fat layer exposed L97.222 - Non-pressure chronic ulcer of left calf with fat layer exposed I89.0 - Lymphedema, not elsewhere classified Diagnoses ICD-10 L97.212: Non-pressure chronic ulcer of right calf with fat layer exposed L97.222: Non-pressure chronic ulcer of left calf with fat layer exposed I89.0: Lymphedema, not elsewhere classified Debrided traumatic and venous ulcers bilateral. Compression wrap Procedures Wound #3 Wound #3 is a Lymphedema located on the Left,Midline,Anterior Lower Leg . There was a Skin/Subcutaneous Tissue Debridement ([id number]) debridement with total area of 45 sq cm performed by [name]., MD. with the following instrument(s): Curette to remove Non-Viable tissue/material including Exudate, Fibrin/Slough, and Subcutaneous after achieving pain control using Lidocaine 4% Topical Solution. A time out was conducted prior to the start of the procedure. A Minimum amount of bleeding was controlled with Pressure. The procedure was tolerated well with a pain level of 0 throughout and a pain level of 0  following the procedure. Post Debridement Measurements: 7.5cm length x 6cm width x 0.1cm depth; 3.534cm^3 volume. Wound #4 Wound #4 is a Lymphedema located on the Right,Midline,Anterior Lower Leg . There was a [place], Jayleen L. ([id number]) Skin/Subcutaneous Tissue Debridement ([id number]) debridement with total area of 22.5 sq cm performed by [name]., MD. with the following instrument(s): Curette to remove Non-Viable tissue/material including Exudate, Fibrin/Slough, and Subcutaneous after achieving pain control using Lidocaine 4% Topical Solution. A time out was conducted prior to the start of the procedure. A Minimum amount of bleeding was controlled with Pressure. The procedure was tolerated well with a pain level of 0 throughout and a pain level of 0 following the procedure. Post Debridement Measurements: 9cm length x 2.5cm width x 0.1cm depth; 1.767cm^3 volume. Plan Wound Cleansing: Wound #1 Left,Anterior Lower Leg: Cleanse wound with mild soap and water May Shower, gently pat wound dry prior to applying new dressing. May shower with protection. Wound #2 Right,Anterior Lower Leg: Cleanse wound with mild soap and water May Shower, gently pat wound dry prior to applying new dressing. May shower with protection. Wound #3 Left,Midline,Anterior Lower Leg: Cleanse wound with mild soap and water May Shower, gently pat wound dry prior to applying new dressing. May shower with protection. Wound #4 Right,Midline,Anterior Lower Leg: Cleanse wound with mild soap and water May Shower, gently pat wound dry prior to applying new dressing. May shower with protection. Anesthetic: Wound #1 Left,Anterior Lower Leg: Topical Lidocaine 4% cream applied to wound bed prior to debridement Wound #2 Right,Anterior  Lower Leg: Topical Lidocaine 4% cream applied to wound bed prior to debridement Wound #3 Left,Midline,Anterior Lower Leg: Topical Lidocaine 4% cream applied to wound bed prior to  debridement Wound #4 Right,Midline,Anterior Lower Leg: Topical Lidocaine 4% cream applied to wound bed prior to debridement Skin Barriers/Peri-Wound Care: Wound #1 Left,Anterior Lower Leg: Skin Prep Wound #2 Right,Anterior Lower Leg: Skin Prep Wound #3 Left,Midline,Anterior Lower Leg: Skin Prep Wound #4 Right,Midline,Anterior Lower Leg: Skin Prep Primary Wound Dressing: [name], Leah L. ([id number]) Wound #1 Left,Anterior Lower Leg: Santyl Ointment Wound #2 Right,Anterior Lower Leg: Santyl Ointment Wound #3 Left,Midline,Anterior Lower Leg: Xeroform Wound #4 Right,Midline,Anterior Lower Leg: Xeroform Secondary Dressing: Wound #1 Left,Anterior Lower Leg: Gauze and Kerlix/Conform - conform please Wound #2 Right,Anterior Lower Leg: Gauze and Kerlix/Conform - conform please Wound #3 Left,Midline,Anterior Lower Leg: Gauze and Kerlix/Conform - conform please Wound #4 Right,Midline,Anterior Lower Leg: Gauze and Kerlix/Conform - conform please Dressing Change Frequency: Wound #1 Left,Anterior Lower Leg: Change dressing every day. Wound #2 Right,Anterior Lower Leg: Change dressing every day. Wound #3 Left,Midline,Anterior Lower Leg: Change dressing every day. Wound #4 Right,Midline,Anterior Lower Leg: Change dressing every day. Follow-up Appointments: Wound #1 Left,Anterior Lower Leg: Return Appointment in 1 week. Wound #2 Right,Anterior Lower Leg: Return Appointment in 1 week. Wound #3 Left,Midline,Anterior Lower Leg: Return Appointment in 1 week. Wound #4 Right,Midline,Anterior Lower Leg: Return Appointment in 1 week. Edema Control: Wound #1 Left,Anterior Lower Leg: Patient to wear own compression stockings Wound #2 Right,Anterior Lower Leg: Patient to wear own compression stockings Wound #3 Left,Midline,Anterior Lower Leg: Patient to wear own compression stockings Wound #4 Right,Midline,Anterior Lower Leg: Patient to wear own compression stockings Home Health: Wound  #1 Left,Anterior Lower Leg: Continue Home Health Visits Home Health Nurse may visit PRN to address patient s wound care needs. FACE TO FACE ENCOUNTER: MEDICARE and MEDICAID PATIENTS: I certify that this patient is under my care and that I had a face-to-face encounter that meets the physician face-to-face encounter requirements with this patient on this date. The encounter with the patient was in whole or in part for the [name], Leah L. ([id number]) following MEDICAL CONDITION: (primary reason for Home Healthcare) MEDICAL NECESSITY: I certify, that based on my findings, NURSING services are a medically necessary home health service. HOME BOUND STATUS: I certify that my clinical findings support that this patient is homebound (i.e., Due to illness or injury, pt requires aid of supportive devices such as crutches, cane, wheelchairs, walkers, the use of special transportation or the assistance of another person to leave their place of residence. There is a normal inability to leave the home and doing so requires considerable and taxing effort. Other absences are for medical reasons / religious services and are infrequent or of short duration when for other reasons). If current dressing causes regression in wound condition, may D/C ordered dressing product/s and apply Normal Saline Moist Dressing daily until next Wound Healing Center / Other MD appointment. Notify Wound Healing Center of regression in wound condition at [phone]. Please direct any NON-WOUND related issues/requests for orders to patient's Primary Care Physician Wound #2 Right,Anterior Lower Leg: Continue Home Health Visits Home Health Nurse may visit PRN to address patient s wound care needs. FACE TO FACE ENCOUNTER: MEDICARE and MEDICAID PATIENTS: I certify that this patient is under my care and that I had a face-to-face encounter that meets the physician face-to-face encounter requirements with this patient on this date. The  encounter with the patient was in whole or in part for  the following MEDICAL CONDITION: (primary reason for Home Healthcare) MEDICAL NECESSITY: I certify, that based on my findings, NURSING services are a medically necessary home health service. HOME BOUND STATUS: I certify that my clinical findings support that this patient is homebound (i.e., Due to illness or injury, pt requires aid of supportive devices such as crutches, cane, wheelchairs, walkers, the use of special transportation or the assistance of another person to leave their place of residence. There is a normal inability to leave the home and doing so requires considerable and taxing effort. Other absences are for medical reasons / religious services and are infrequent or of short duration when for other reasons). If current dressing causes regression in wound condition, may D/C ordered dressing product/s and apply Normal Saline Moist Dressing daily until next Wound Healing Center / Other MD appointment. Notify Wound Healing Center of regression in wound condition at [phone]. Please direct any NON-WOUND related issues/requests for orders to patient's Primary Care Physician Wound #3 Left,Midline,Anterior Lower Leg: Continue Home Health Visits Home Health Nurse may visit PRN to address patient s wound care needs. FACE TO FACE ENCOUNTER: MEDICARE and MEDICAID PATIENTS: I certify that this patient is under my care and that I had a face-to-face encounter that meets the physician face-to-face encounter requirements with this patient on this date. The encounter with the patient was in whole or in part for the following MEDICAL CONDITION: (primary reason for Home Healthcare) MEDICAL NECESSITY: I certify, that based on my findings, NURSING services are a medically necessary home health service. HOME BOUND STATUS: I certify that my clinical findings support that this patient is homebound (i.e., Due to illness or injury, pt requires aid of  supportive devices such as crutches, cane, wheelchairs, walkers, the use of special transportation or the assistance of another person to leave their place of residence. There is a normal inability to leave the home and doing so requires considerable and taxing effort. Other absences are for medical reasons / religious services and are infrequent or of short duration when for other reasons). If current dressing causes regression in wound condition, may D/C ordered dressing product/s and apply Normal Saline Moist Dressing daily until next Wound Healing Center / Other MD appointment. Notify Wound Healing Center of regression in wound condition at [phone]. Please direct any NON-WOUND related issues/requests for orders to patient's Primary Care Physician Wound #4 Right,Midline,Anterior Lower Leg: Continue Home Health Visits Home Health Nurse may visit PRN to address patient s wound care needs. FACE TO FACE ENCOUNTER: MEDICARE and MEDICAID PATIENTS: I certify that this patient is under my care and that I had a face-to-face encounter that meets the physician face-to-face encounter requirements with this patient on this date. The encounter with the patient was in whole or in part for the [name], Leah L. ([id number]) following MEDICAL CONDITION: (primary reason for Home Healthcare) MEDICAL NECESSITY: I certify, that based on my findings, NURSING services are a medically necessary home health service. HOME BOUND STATUS: I certify that my clinical findings support that this patient is homebound (i.e., Due to illness or injury, pt requires aid of supportive devices such as crutches, cane, wheelchairs, walkers, the use of special transportation or the assistance of another person to leave their place of residence. There is a normal inability to leave the home and doing so requires considerable and taxing effort. Other absences are for medical reasons / religious services and are infrequent or of short  duration when for other reasons). If current dressing causes  regression in wound condition, may D/C ordered dressing product/s and apply Normal Saline Moist Dressing daily until next Wound Healing Center / Other MD appointment. Notify Wound Healing Center of regression in wound condition at [phone]. Please direct any NON-WOUND related issues/requests for orders to patient's Primary Care Physician Electronic Signature(s) Signed: 04/05/2015 4:37:28 PM By: Dayton Martes RCP, RRT, CHT Previous Signature: 03/24/2015 2:18:21 PM Version By: Leah Sax MD Entered By: Dayton Martes on 04/05/2015 16:37:27 [name], [name] ([id number]) -------------------------------------------------------------------------------- SuperBill Details Patient Name: [name] Date of Service: 03/24/2015 Medical Record Number: [id number] Patient Account Number: [id number] Date of Birth/Sex: 01/08/1913 (81 y.o. Female) Treating RN: Primary Care Physician: PATIENT, NO Other Clinician: Referring Physician: Marisue Yu Treating Physician/Extender: Elayne Snare in Treatment: 3 Diagnosis Coding ICD-10 Codes Code Description [phone] Non-pressure chronic ulcer of right calf with fat layer exposed L97.222 Non-pressure chronic ulcer of left calf with fat layer exposed I89.0 Lymphedema, not elsewhere classified Facility Procedures CPT4 Code: [id number] Description: 11042 - DEB SUBQ TISSUE 20 SQ CM/< ICD-10 Description Diagnosis L97.212 Non-pressure chronic ulcer of right calf with fat L97.222 Non-pressure chronic ulcer of left calf with fat l Modifier: layer exposed ayer exposed Quantity: 1 CPT4 Code: [id number] Description: 11045 - DEB SUBQ TISS EA ADDL 20CM ICD-10 Description Diagnosis L97.212 Non-pressure chronic ulcer of right calf with fat L97.222 Non-pressure chronic ulcer of left calf with fat l Modifier: layer exposed ayer exposed Quantity: 3 Physician  Procedures CPT4 Code: [id number] Description: 64403 - WC PHYS LEVEL 2 - EST PT ICD-10 Description Diagnosis L97.212 Non-pressure chronic ulcer of right calf with fat l L97.222 Non-pressure chronic ulcer of left calf with fat la Modifier: ayer exposed yer exposed Quantity: 1 CPT4 Code: [id number] Kuyper, Summerlyn Description: 11042 - WC PHYS SUBQ TISS 20 SQ CM ICD-10 Description Diagnosis L97.212 Non-pressure chronic ulcer of right calf with fat l L97.222 Non-pressure chronic ulcer of left calf with fat la L. ([id number]) Modifier: ayer exposed yer exposed Quantity: 1 Electronic Signature(s) Signed: 03/24/2015 2:20:04 PM By: Leah Sax MD Entered By: [name] on 03/24/2015 14:20:04

## 2015-03-31 ENCOUNTER — Encounter: Payer: Medicare Other | Attending: Surgery | Admitting: Surgery

## 2015-03-31 DIAGNOSIS — L97222 Non-pressure chronic ulcer of left calf with fat layer exposed: Secondary | ICD-10-CM | POA: Insufficient documentation

## 2015-03-31 DIAGNOSIS — L97212 Non-pressure chronic ulcer of right calf with fat layer exposed: Secondary | ICD-10-CM | POA: Insufficient documentation

## 2015-03-31 DIAGNOSIS — I890 Lymphedema, not elsewhere classified: Secondary | ICD-10-CM | POA: Diagnosis not present

## 2015-03-31 DIAGNOSIS — I10 Essential (primary) hypertension: Secondary | ICD-10-CM | POA: Insufficient documentation

## 2015-04-01 NOTE — Progress Notes (Signed)
[name], [name] ([id number]) Visit Report for 03/31/2015 Arrival Information Details Patient Name: ANUSHREE, DORSI Date of Service: 03/31/2015 11:00 AM Medical Record Number: [id number] Patient Account Number: [id number] Date of Birth/Sex: 12/31/1912 (81 y.o. Female) Treating RN: Curtis Sites Primary Care Physician: PATIENT, NO Other Clinician: Referring Physician: Marisue Ivan Treating Physician/Extender: Rudene Re in Treatment: 4 Visit Information History Since Last Visit Added or deleted any medications: No Patient Arrived: Wheel Chair Any new allergies or adverse reactions: No Arrival Time: 11:11 Had a fall or experienced change in No activities of daily living that may affect Accompanied By: friend risk of falls: Transfer Assistance: Manual Signs or symptoms of abuse/neglect since last No Patient Identification Verified: Yes visito Secondary Verification Process Yes Hospitalized since last visit: No Completed: Pain Present Now: No Patient Requires Transmission-Based No Precautions: Patient Has Alerts: Yes Electronic Signature(s) Signed: 03/31/2015 3:55:41 PM By: Curtis Sites Entered By: Curtis Sites on 03/31/2015 11:11:49 Mezquita, [name] ([id number]) -------------------------------------------------------------------------------- Encounter Discharge Information Details Patient Name: [name] Date of Service: 03/31/2015 11:00 AM Medical Record Number: [id number] Patient Account Number: [id number] Date of Birth/Sex: 02-Dec-1912 (81 y.o. Female) Treating RN: Primary Care Physician: PATIENT, NO Other Clinician: Referring Physician: Marisue Ivan Treating Physician/Extender: Rudene Re in Treatment: 4 Encounter Discharge Information Items Schedule Follow-up Appointment: No Medication Reconciliation completed No and provided to Patient/Care Hadas Jessop: Provided on Clinical Summary of Care: 03/31/2015 Form Type Recipient Paper  Patient NP Electronic Signature(s) Signed: 03/31/2015 12:06:57 PM By: Gwenlyn Perking Entered By: Gwenlyn Perking on 03/31/2015 12:06:57 Leitz, [name] ([id number]) -------------------------------------------------------------------------------- Lower Extremity Assessment Details Patient Name: [name] Date of Service: 03/31/2015 11:00 AM Medical Record Number: [id number] Patient Account Number: [id number] Date of Birth/Sex: 03-Dec-1912 (81 y.o. Female) Treating RN: Curtis Sites Primary Care Physician: PATIENT, NO Other Clinician: Referring Physician: Marisue Ivan Treating Physician/Extender: Rudene Re in Treatment: 4 Edema Assessment Assessed: [Left: No] [Right: No] Edema: [Left: Yes] [Right: Yes] Calf Left: Right: Point of Measurement: 35 cm From Medial Instep 34.7 cm 36 cm Ankle Left: Right: Point of Measurement: 8 cm From Medial Instep 22 cm 22.2 cm Vascular Assessment Pulses: Posterior Tibial Dorsalis Pedis Palpable: [Left:Yes] [Right:Yes] Extremity colors, hair growth, and conditions: Extremity Color: [Left:Mottled] [Right:Mottled] Hair Growth on Extremity: [Left:No] [Right:No] Temperature of Extremity: [Left:Warm] [Right:Warm] Capillary Refill: [Left:< 3 seconds] [Right:< 3 seconds] Toe Nail Assessment Left: Right: Thick: No No Discolored: No No Deformed: No No Improper Length and Hygiene: No No Electronic Signature(s) Signed: 03/31/2015 3:55:41 PM By: Curtis Sites Entered By: Curtis Sites on 03/31/2015 11:23:16 Buday, [name] ([id number]) -------------------------------------------------------------------------------- Multi Wound Chart Details Patient Name: [name] Date of Service: 03/31/2015 11:00 AM Medical Record Number: [id number] Patient Account Number: [id number] Date of Birth/Sex: [birth year] (81 y.o. Female) Treating RN: Curtis Sites Primary Care Physician: PATIENT, NO Other Clinician: Referring Physician: Marisue Ivan Treating Physician/Extender: Rudene Re in Treatment: 4 Vital Signs Height(in): 64 Pulse(bpm): 64 Weight(lbs): 147 Blood Pressure 117/50 (mmHg): Body Mass Index(BMI): 25 Temperature(F): 97.6 Respiratory Rate 18 (breaths/min): Photos: [1:No Photos] [2:No Photos] [3:No Photos] Wound Location: [1:Left, Anterior Lower Leg] [2:Right, Anterior Lower Leg Left, Midline, Anterior] [3:Lower Leg] Wounding Event: [1:Trauma] [2:Trauma] [3:Blister] Primary Etiology: [1:Skin Tear] [2:Skin Tear] [3:Lymphedema] Date Acquired: [1:01/31/2015] [2:01/31/2015] [3:03/14/2015] Weeks of Treatment: [1:4] [2:4] [3:2] Wound Status: [1:Open] [2:Open] [3:Open] Clustered Wound: [1:No] [2:No] [3:No] Measurements L x W x D 6.5x3.1x0.5 [2:2.7x1.9x0.3] [3:1x0.7x0.1] (cm) Area (cm) : [1:15.826] [2:4.029] [3:0.55] Volume (cm) : [1:7.913] [2:1.209] [3:0.055] % Reduction  in Area: [1:55.80%] [2:19.90%] [3:88.30%] % Reduction in Volume: 26.30% [2:19.80%] [3:88.30%] Classification: [1:Full Thickness Without Exposed Support Structures] [2:Full Thickness Without Exposed Support Structures] [3:Partial Thickness] Periwound Skin Texture: No Abnormalities Noted [2:No Abnormalities Noted] [3:No Abnormalities Noted] Periwound Skin [1:No Abnormalities Noted] [2:No Abnormalities Noted] [3:No Abnormalities Noted] Moisture: Periwound Skin Color: No Abnormalities Noted [2:No Abnormalities Noted] [3:No Abnormalities Noted] Tenderness on [1:No] [2:No] [3:No] Wound Number: 4 N/A N/A Photos: No Photos N/A N/A Wound Location: Left, Midline, Anterior N/A N/A Lower Leg Wounding Event: Blister N/A N/A Gonsalez, Shaney L. ([id number]) Primary Etiology: Lymphedema N/A N/A Date Acquired: 03/14/2015 N/A N/A Weeks of Treatment: 2 N/A N/A Wound Status: Healed - Epithelialized N/A N/A Clustered Wound: Yes N/A N/A Measurements L x W x D 0x0x0 N/A N/A (cm) Area (cm) : 0 N/A N/A Volume (cm) : 0 N/A N/A % Reduction in Area:  100.00% N/A N/A % Reduction in Volume: 100.00% N/A N/A Classification: Partial Thickness N/A N/A Periwound Skin Texture: No Abnormalities Noted N/A N/A Periwound Skin No Abnormalities Noted N/A N/A Moisture: Periwound Skin Color: No Abnormalities Noted N/A N/A Tenderness on No N/A N/A Palpation: Treatment Notes Electronic Signature(s) Signed: 03/31/2015 3:55:41 PM By: Curtis Sites Entered By: Curtis Sites on 03/31/2015 11:33:19 Tucci, [name] ([id number]) -------------------------------------------------------------------------------- Multi-Disciplinary Care Plan Details Patient Name: [name] Date of Service: 03/31/2015 11:00 AM Medical Record Number: [id number] Patient Account Number: [id number] Date of Birth/Sex: 11-20-1912 (81 y.o. Female) Treating RN: Curtis Sites Primary Care Physician: PATIENT, NO Other Clinician: Referring Physician: Marisue Ivan Treating Physician/Extender: Rudene Re in Treatment: 4 Active Inactive Abuse / Safety / Falls / Self Care Management Nursing Diagnoses: Impaired home maintenance Impaired physical mobility Potential for falls Self care deficit: actual or potential Goals: Patient will remain injury free Date Initiated: 03/03/2015 Goal Status: Active Patient/caregiver will verbalize understanding of skin care regimen Date Initiated: 03/03/2015 Goal Status: Active Patient/caregiver will verbalize/demonstrate measure taken to improve self care Date Initiated: 03/03/2015 Goal Status: Active Patient/caregiver will verbalize/demonstrate measures taken to improve the patient's personal safety Date Initiated: 03/03/2015 Goal Status: Active Patient/caregiver will verbalize/demonstrate measures taken to prevent injury and/or falls Date Initiated: 03/03/2015 Goal Status: Active Patient/caregiver will verbalize/demonstrate understanding of what to do in case of emergency Date Initiated: 03/03/2015 Goal Status:  Active Interventions: Assess: immobility, friction, shearing, incontinence upon admission and as needed Assess impairment of mobility on admission and as needed per policy Provide education on basic hygiene Provide education on personal and home safety Provide education on safe transfers NEHAL, WITTING ([id number]) Treatment Activities: Education provided on Basic Hygiene : 03/10/2015 Notes: Orientation to the Wound Care Program Nursing Diagnoses: Knowledge deficit related to the wound healing center program Goals: Patient/caregiver will verbalize understanding of the Wound Healing Center Program Date Initiated: 03/03/2015 Goal Status: Active Interventions: Provide education on orientation to the wound center Notes: Venous Leg Ulcer Nursing Diagnoses: Knowledge deficit related to disease process and management Potential for venous Insuffiency (use before diagnosis confirmed) Goals: Patient will maintain optimal edema control Date Initiated: 03/03/2015 Goal Status: Active Patient/caregiver will verbalize understanding of disease process and disease management Date Initiated: 03/03/2015 Goal Status: Active Verify adequate tissue perfusion prior to therapeutic compression application Date Initiated: 03/03/2015 Goal Status: Active Interventions: Assess peripheral edema status every visit. Compression as ordered Provide education on venous insufficiency Treatment Activities: Test ordered outside of clinic : 03/31/2015 Notes: JOLYNE, LAYE. ([id number]) Wound/Skin Impairment Nursing Diagnoses: Impaired tissue integrity Knowledge deficit related to ulceration/compromised skin integrity Goals: Patient/caregiver  will verbalize understanding of skin care regimen Date Initiated: 03/03/2015 Goal Status: Active Ulcer/skin breakdown will have a volume reduction of 30% by week 4 Date Initiated: 03/03/2015 Goal Status: Active Ulcer/skin breakdown will have a volume reduction of 50% by week  8 Date Initiated: 03/03/2015 Goal Status: Active Ulcer/skin breakdown will have a volume reduction of 80% by week 12 Date Initiated: 03/03/2015 Goal Status: Active Ulcer/skin breakdown will heal within 14 weeks Date Initiated: 03/03/2015 Goal Status: Active Interventions: Assess patient/caregiver ability to perform ulcer/skin care regimen upon admission and as needed Assess ulceration(s) every visit Provide education on ulcer and skin care Notes: Electronic Signature(s) Signed: 03/31/2015 3:55:41 PM By: Curtis Sites Entered By: Curtis Sites on 03/31/2015 11:30:41 Hilgeman, [name] ([id number]) -------------------------------------------------------------------------------- Patient/Caregiver Education Details Patient Name: [name] Date of Service: 03/31/2015 11:00 AM Medical Record Number: [id number] Patient Account Number: [id number] Date of Birth/Gender: 03/07/1913 (81 y.o. Female) Treating RN: Curtis Sites Primary Care Physician: PATIENT, NO Other Clinician: Referring Physician: Marisue Ivan Treating Physician/Extender: Rudene Re in Treatment: 4 Education Assessment Education Provided To: Patient and Caregiver Education Topics Provided Wound/Skin Impairment: Handouts: Other: wound care as ordered Methods: Demonstration, Explain/Verbal Responses: State content correctly Electronic Signature(s) Signed: 03/31/2015 3:55:41 PM By: Curtis Sites Entered By: Curtis Sites on 03/31/2015 11:33:41 Weible, [name] ([id number]) -------------------------------------------------------------------------------- Wound Assessment Details Patient Name: [name] Date of Service: 03/31/2015 11:00 AM Medical Record Number: [id number] Patient Account Number: [id number] Date of Birth/Sex: May 26, 1913 (81 y.o. Female) Treating RN: Curtis Sites Primary Care Physician: PATIENT, NO Other Clinician: Referring Physician: Marisue Ivan Treating Physician/Extender:  Rudene Re in Treatment: 4 Wound Status Wound Number: 1 Primary Etiology: Skin Tear Wound Location: Left Lower Leg - Anterior Wound Status: Open Wounding Event: Trauma Comorbid History: Hypertension, Osteoarthritis Date Acquired: 01/31/2015 Weeks Of Treatment: 4 Clustered Wound: No Photos Photo Uploaded By: Curtis Sites on 03/31/2015 15:39:34 Wound Measurements Length: (cm) 6.5 % Reduction in Width: (cm) 3.1 % Reduction in Depth: (cm) 0.5 Epithelializati Area: (cm) 15.826 Tunneling: Volume: (cm) 7.913 Undermining: Area: 55.8% Volume: 26.3% on: Small (1-33%) No No Wound Description Full Thickness Without Exposed Classification: Support Structures Wound Margin: Distinct, outline attached Exudate Medium Amount: Exudate Type: Serosanguineous Exudate Color: red, brown Foul Odor After Cleansing: No Wound Bed Granulation Amount: Small (1-33%) Exposed Structure Granulation Quality: Pink, Pale Fascia Exposed: No Necrotic Amount: Large (67-100%) Fat Layer Exposed: No Thome, Marasia L. ([id number]) Necrotic Quality: Eschar, Adherent Slough Tendon Exposed: No Muscle Exposed: No Joint Exposed: No Bone Exposed: No Limited to Skin Breakdown Periwound Skin Texture Texture Color No Abnormalities Noted: No No Abnormalities Noted: No Callus: No Atrophie Blanche: No Crepitus: No Cyanosis: No Excoriation: No Ecchymosis: No Fluctuance: No Erythema: No Friable: No Hemosiderin Staining: No Induration: No Mottled: No Localized Edema: Yes Pallor: No Rash: No Rubor: No Scarring: No Temperature / Pain Moisture Temperature: No Abnormality No Abnormalities Noted: No Tenderness on Palpation: Yes Dry / Scaly: No Maceration: No Moist: Yes Wound Preparation Ulcer Cleansing: Rinsed/Irrigated with Saline Topical Anesthetic Applied: Other: Lidocaine 4%, Electronic Signature(s) Signed: 03/31/2015 12:35:55 PM By: Curtis Sites Entered By: Curtis Sites on  03/31/2015 12:35:55 Ourada, [name] ([id number]) -------------------------------------------------------------------------------- Wound Assessment Details Patient Name: [name] Date of Service: 03/31/2015 11:00 AM Medical Record Number: [id number] Patient Account Number: [id number] Date of Birth/Sex: [birth year] (81 y.o. Female) Treating RN: Curtis Sites Primary Care Physician: PATIENT, NO Other Clinician: Referring Physician: Marisue Ivan Treating Physician/Extender: Rudene Re in Treatment: 4 Wound Status Wound  Number: 2 Primary Etiology: Skin Tear Wound Location: Right Lower Leg - Anterior Wound Status: Open Wounding Event: Trauma Comorbid History: Hypertension, Osteoarthritis Date Acquired: 01/31/2015 Weeks Of Treatment: 4 Clustered Wound: No Photos Photo Uploaded By: Curtis Sites on 03/31/2015 15:39:35 Wound Measurements Length: (cm) 2.7 % Reduction in Width: (cm) 1.9 % Reduction in Depth: (cm) 0.3 Epithelializati Area: (cm) 4.029 Tunneling: Volume: (cm) 1.209 Undermining: Area: 19.9% Volume: 19.8% on: Small (1-33%) No No Wound Description Full Thickness Without Exposed Classification: Support Structures Wound Margin: Distinct, outline attached Exudate Medium Amount: Exudate Type: Serosanguineous Exudate Color: red, brown Foul Odor After Cleansing: No Wound Bed Granulation Amount: Small (1-33%) Exposed Structure Granulation Quality: Red Fascia Exposed: No Necrotic Amount: Large (67-100%) Fat Layer Exposed: No Legner, Jadaya L. ([id number]) Necrotic Quality: Adherent Slough Tendon Exposed: No Muscle Exposed: No Joint Exposed: No Bone Exposed: No Limited to Skin Breakdown Periwound Skin Texture Texture Color No Abnormalities Noted: No No Abnormalities Noted: No Callus: No Atrophie Blanche: No Crepitus: No Cyanosis: No Excoriation: No Ecchymosis: No Fluctuance: No Erythema: No Friable: No Hemosiderin Staining:  No Induration: No Mottled: No Localized Edema: Yes Pallor: No Rash: No Rubor: No Scarring: No Temperature / Pain Moisture Temperature: No Abnormality No Abnormalities Noted: No Tenderness on Palpation: Yes Dry / Scaly: No Maceration: No Moist: Yes Wound Preparation Ulcer Cleansing: Rinsed/Irrigated with Saline Topical Anesthetic Applied: Other: lidocaine 4% ceam, Electronic Signature(s) Signed: 03/31/2015 12:36:12 PM By: Curtis Sites Entered By: Curtis Sites on 03/31/2015 12:36:12 Joy, [name] ([id number]) -------------------------------------------------------------------------------- Wound Assessment Details Patient Name: [name] Date of Service: 03/31/2015 11:00 AM Medical Record Number: [id number] Patient Account Number: [id number] Date of Birth/Sex: 1913-04-04 (81 y.o. Female) Treating RN: Curtis Sites Primary Care Physician: PATIENT, NO Other Clinician: Referring Physician: Marisue Ivan Treating Physician/Extender: Rudene Re in Treatment: 4 Wound Status Wound Number: 3 Primary Etiology: Lymphedema Wound Location: Right Lower Leg - Midline, Wound Status: Open Anterior Comorbid History: Hypertension, Osteoarthritis Wounding Event: Blister Date Acquired: 03/14/2015 Weeks Of Treatment: 2 Clustered Wound: No Photos Photo Uploaded By: Curtis Sites on 03/31/2015 15:40:06 Wound Measurements Length: (cm) 1 % Reduction in Width: (cm) 0.7 % Reduction in Depth: (cm) 0.1 Epithelializati Area: (cm) 0.55 Tunneling: Volume: (cm) 0.055 Undermining: Area: 88.3% Volume: 88.3% on: Large (67-100%) No No Wound Description Classification: Partial Thickness Wound Margin: Indistinct, nonvisible Exudate Amount: Small Exudate Type: Serosanguineous Exudate Color: red, brown Foul Odor After Cleansing: No Wound Bed Granulation Amount: Large (67-100%) Exposed Structure Granulation Quality: Pink, Pale Fascia Exposed: No Necrotic Amount:  None Present (0%) Fat Layer Exposed: No Weiand, Jenni L. ([id number]) Tendon Exposed: No Muscle Exposed: No Joint Exposed: No Bone Exposed: No Limited to Skin Breakdown Periwound Skin Texture Texture Color No Abnormalities Noted: No No Abnormalities Noted: No Callus: No Atrophie Blanche: No Crepitus: No Cyanosis: No Excoriation: No Ecchymosis: No Fluctuance: No Erythema: No Friable: No Hemosiderin Staining: No Induration: No Mottled: No Localized Edema: Yes Pallor: No Rash: No Rubor: No Scarring: No Temperature / Pain Moisture Temperature: No Abnormality No Abnormalities Noted: No Tenderness on Palpation: Yes Dry / Scaly: No Maceration: No Moist: Yes Wound Preparation Ulcer Cleansing: Rinsed/Irrigated with Saline Topical Anesthetic Applied: None Electronic Signature(s) Signed: 03/31/2015 12:36:39 PM By: Curtis Sites Entered By: Curtis Sites on 03/31/2015 12:36:39 Delano, [name] ([id number]) -------------------------------------------------------------------------------- Wound Assessment Details Patient Name: [name] Date of Service: 03/31/2015 11:00 AM Medical Record Number: [id number] Patient Account Number: [id number] Date of Birth/Sex: [birth year] (81 y.o. Female) Treating RN: Curtis Sites  Primary Care Physician: PATIENT, NO Other Clinician: Referring Physician: Marisue Ivan Treating Physician/Extender: Rudene Re in Treatment: 4 Wound Status Wound Number: 4 Primary Etiology: Lymphedema Wound Location: Left, Midline, Anterior Lower Wound Status: Healed - Epithelialized Leg Wounding Event: Blister Date Acquired: 03/14/2015 Weeks Of Treatment: 2 Clustered Wound: Yes Photos Photo Uploaded By: Curtis Sites on 03/31/2015 15:40:07 Wound Measurements Length: (cm) 0 % Reduction Width: (cm) 0 % Reduction Depth: (cm) 0 Area: (cm) 0 Volume: (cm) 0 in Area: 100% in Volume: 100% Wound Description Classification: Partial  Thickness Periwound Skin Texture Texture Color No Abnormalities Noted: No No Abnormalities Noted: No Moisture No Abnormalities Noted: No Electronic Signature(s) Signed: 03/31/2015 3:55:41 PM By: Georgiann Mccoy, [name] ([id number]) Entered By: Curtis Sites on 03/31/2015 11:30:32 Territo, [name] ([id number]) -------------------------------------------------------------------------------- Vitals Details Patient Name: [name] Date of Service: 03/31/2015 11:00 AM Medical Record Number: [id number] Patient Account Number: [id number] Date of Birth/Sex: 07/12/1913 (81 y.o. Female) Treating RN: Curtis Sites Primary Care Physician: PATIENT, NO Other Clinician: Referring Physician: Marisue Ivan Treating Physician/Extender: Rudene Re in Treatment: 4 Vital Signs Time Taken: 11:14 Temperature (F): 97.6 Height (in): 64 Pulse (bpm): 64 Weight (lbs): 147 Respiratory Rate (breaths/min): 18 Body Mass Index (BMI): 25.2 Blood Pressure (mmHg): 117/50 Reference Range: 80 - 120 mg / dl Electronic Signature(s) Signed: 03/31/2015 3:55:41 PM By: Curtis Sites Entered By: Curtis Sites on 03/31/2015 11:15:54

## 2015-04-01 NOTE — Progress Notes (Addendum)
VIOLIA, KNOPF ([id number]) Visit Report for 03/31/2015 Chief Complaint Document Details Patient Name: [name], [name] 03/31/2015 11:00 Date of Service: AM Medical Record [id number] Number: Patient Account Number: [id number] [birth year] (81 y.o. Treating RN: Date of Birth/Sex: Female) Other Clinician: Primary Care Physician: PATIENT, NO Treating Mervyn Pflaum Referring Physician: Marisue Ivan Physician/Extender: Weeks in Treatment: 4 Information Obtained from: Patient Chief Complaint Patient seen for complaints of Non-Healing Wound. pleasant 81 year old patient who had a fall and injured both lower extremities on 01/31/2015 Electronic Signature(s) Signed: 03/31/2015 11:52:41 AM By: Evlyn Kanner MD, FACS Entered By: Evlyn Kanner on 03/31/2015 11:52:41 Southwell, [name] ([id number]) -------------------------------------------------------------------------------- Debridement Details Patient Name: [name], [name] 03/31/2015 11:00 Date of Service: AM Medical Record [id number] Number: Patient Account Number: [id number] Jan 12, 1913 (81 y.o. Treating RN: Date of Birth/Sex: Female) Other Clinician: Primary Care Physician: PATIENT, NO Treating Felicidad Sugarman Referring Physician: Marisue Ivan Physician/Extender: Weeks in Treatment: 4 Debridement Performed for Wound #1 Left,Anterior Lower Leg Assessment: Performed By: Physician Tristan Schroeder., MD Debridement: Debridement Pre-procedure Yes Verification/Time Out Taken: Start Time: 11:36 Pain Control: Lidocaine 4% Topical Solution Level: Skin/Subcutaneous Tissue Total Area Debrided (L x 6.5 (cm) x 3.1 (cm) = 20.15 (cm) W): Tissue and other Viable, Non-Viable, Fibrin/Slough, Subcutaneous material debrided: Instrument: Curette Bleeding: Minimum Hemostasis Achieved: Pressure End Time: 11:38 Procedural Pain: 0 Post Procedural Pain: 0 Response to Treatment: Procedure was tolerated well Post Debridement Measurements of  Total Wound Length: (cm) 6.5 Width: (cm) 3.1 Depth: (cm) 0.5 Volume: (cm) 7.913 Electronic Signature(s) Signed: 03/31/2015 11:52:23 AM By: Evlyn Kanner MD, FACS Entered By: Evlyn Kanner on 03/31/2015 11:52:23 Botkins, [name] ([id number]) -------------------------------------------------------------------------------- Debridement Details Patient Name: Leah Deed L. 03/31/2015 11:00 Date of Service: AM Medical Record [id number] Number: Patient Account Number: [id number] 1913-01-11 (81 y.o. Treating RN: Date of Birth/Sex: Female) Other Clinician: Primary Care Physician: PATIENT, NO Treating Rogen Porte Referring Physician: Marisue Ivan Physician/Extender: Weeks in Treatment: 4 Debridement Performed for Wound #2 Right,Anterior Lower Leg Assessment: Performed By: Physician Tristan Schroeder., MD Debridement: Debridement Pre-procedure Yes Verification/Time Out Taken: Start Time: 11:38 Pain Control: Lidocaine 4% Topical Solution Level: Skin/Subcutaneous Tissue Total Area Debrided (L x 2.7 (cm) x 1.9 (cm) = 5.13 (cm) W): Tissue and other Viable, Non-Viable, Fibrin/Slough, Subcutaneous material debrided: Instrument: Curette Bleeding: Minimum Hemostasis Achieved: Pressure End Time: 11:40 Procedural Pain: 0 Post Procedural Pain: 0 Response to Treatment: Procedure was tolerated well Post Debridement Measurements of Total Wound Length: (cm) 2.7 Width: (cm) 1.9 Depth: (cm) 0.3 Volume: (cm) 1.209 Electronic Signature(s) Signed: 03/31/2015 11:52:31 AM By: Evlyn Kanner MD, FACS Entered By: Evlyn Kanner on 03/31/2015 11:52:31 Sigel, [name] ([id number]) -------------------------------------------------------------------------------- HPI Details Patient Name: [name]. 03/31/2015 11:00 Date of Service: AM Medical Record [id number] Number: Patient Account Number: [id number] 1912/09/22 (81 y.o. Treating RN: Date of Birth/Sex: Female) Other  Clinician: Primary Care Physician: PATIENT, NO Treating Teena Mangus Referring Physician: Marisue Ivan Physician/Extender: Weeks in Treatment: 4 History of Present Illness Location: both lower extremities Quality: Patient reports experiencing a dull pain to affected area(s). Severity: Patient states wound (s) are getting better. Duration: Patient has had the wound for < 4 weeks prior to presenting for treatment Timing: Pain in wound is Intermittent (comes and goes Context: The wound occurred when the patient had a fall and lacerated both lower extremities. Modifying Factors: Other treatment(s) tried include:she had sutures applied there for a while and when they were removed the wound opened up. Associated Signs and Symptoms: Patient reports having difficulty standing  for long periods. HPI Description: 81 year old female who fell and had an injury to her right forehead, right elbow and both shins on 01/31/2015. She went to the ER at [facility] and had been treated appropriately with sutures being placed to her right and left lower extremity. Also had a CT scan which showed no intracranial hemorrhage and only a large scalp hematoma on the right frontal region.she was put on Keflex for 5 days. she had also received Bactrim for 14 days.Since then she has been seen by her PCP and he had put her on Augmentin for 14 days and wound culture was obtained. culture reports were reviewed -- she grew an MRSA sensitive to tetracycline, Bactrim and vancomycin. Her past medical history significant for osteoporosis, hypertension, spinal stenosis, right hip replacement, appendectomy and abdomen hysterectomy for ovarian cancer. She has been seen in the past by the vascular surgery group at [facility] and she has an appointment to see them again. She is known to use lymphedema pumps while she has been in her assisted living home but has not been using them for the last month. She does  also have compression stockings which she uses intermittently. 03/17/2015 -- She was seen by Dr. Gilda Crease on 03/03/2015 -- he reviewed the patientos case and recommended no surgical intervention. He has recommended compression stockings of the 20-30 mmHg variety and also recommended lymphedema pumps. He will see her back in 3 months. The patient has developed some blisters on both lower extremities and this may be due to the fact that she's had a Kerlix gauze wrap on both lower extremities and is also using the lymphedema pumps. Electronic Signature(s) Signed: 03/31/2015 11:53:07 AM By: Evlyn Kanner MD, FACS Entered By: Evlyn Kanner on 03/31/2015 11:53:07 Lineman, [name] ([id number]) -------------------------------------------------------------------------------- Physical Exam Details Patient Name: [name], [name] 03/31/2015 11:00 Date of Service: AM Medical Record [id number] Number: Patient Account Number: [id number] [birth year] (81 y.o. Treating RN: Date of Birth/Sex: Female) Other Clinician: Primary Care Physician: PATIENT, NO Treating Nohely Whitehorn Referring Physician: Marisue Ivan Physician/Extender: Weeks in Treatment: 4 Constitutional . Pulse regular. Respirations normal and unlabored. Afebrile. . Eyes Nonicteric. Reactive to light. Ears, Nose, Mouth, and Throat Lips, teeth, and gums WNL.Marland Kitchen Moist mucosa without lesions . Neck supple and nontender. No palpable supraclavicular or cervical adenopathy. Normal sized without goiter. Respiratory WNL. No retractions.. Cardiovascular Pedal Pulses WNL. the edema both lower extremities is much better. Chest Breasts symmetical and no nipple discharge.. Breast tissue WNL, no masses, lumps, or tenderness.. Lymphatic No adneopathy. No adenopathy. No adenopathy. Musculoskeletal Adexa without tenderness or enlargement.. Digits and nails w/o clubbing, cyanosis, infection, petechiae, ischemia, or inflammatory  conditions.. Integumentary (Hair, Skin) No suspicious lesions. No crepitus or fluctuance. No peri-wound warmth or erythema. No masses.Marland Kitchen Psychiatric Judgement and insight Intact.. No evidence of depression, anxiety, or agitation.. Notes Her wounds need some sharp debridement with a curette as they have slough. The blisters have now healed nicely and they are looking good. Electronic Signature(s) Signed: 03/31/2015 11:53:58 AM By: Evlyn Kanner MD, FACS Entered By: Evlyn Kanner on 03/31/2015 11:53:58 Kitchens, [name] ([id number]) -------------------------------------------------------------------------------- Physician Orders Details Patient Name: JUNICE, FEI 03/31/2015 11:00 Date of Service: AM Medical Record [id number] Number: Patient Account Number: [id number] 08-25-1913 (81 y.o. Treating RN: Curtis Sites Date of Birth/Sex: Female) Other Clinician: Primary Care Physician: PATIENT, NO Treating Laneta Guerin Referring Physician: Marisue Ivan Physician/Extender: Tania Ade in Treatment: 4 Verbal / Phone Orders: Yes Clinician: Curtis Sites Read Back and Verified: Yes Diagnosis  Coding Wound Cleansing Wound #1 Left,Anterior Lower Leg o Cleanse wound with mild soap and water o May Shower, gently pat wound dry prior to applying new dressing. o May shower with protection. Wound #2 Right,Anterior Lower Leg o Cleanse wound with mild soap and water o May Shower, gently pat wound dry prior to applying new dressing. o May shower with protection. Wound #3 Right,Midline,Anterior Lower Leg o Cleanse wound with mild soap and water o May Shower, gently pat wound dry prior to applying new dressing. o May shower with protection. Anesthetic Wound #1 Left,Anterior Lower Leg o Topical Lidocaine 4% cream applied to wound bed prior to debridement Wound #2 Right,Anterior Lower Leg o Topical Lidocaine 4% cream applied to wound bed prior to debridement Wound #3  Right,Midline,Anterior Lower Leg o Topical Lidocaine 4% cream applied to wound bed prior to debridement Skin Barriers/Peri-Wound Care Wound #1 Left,Anterior Lower Leg o Skin Prep Wound #2 Right,Anterior Lower Leg o Skin Prep Primary Wound Dressing Wound #1 Left,Anterior Lower Leg Lynam, Analeah L. ([id number]) o Santyl Ointment Wound #2 Right,Anterior Lower Leg o Santyl Ointment Wound #3 Right,Midline,Anterior Lower Leg o Xeroform Secondary Dressing Wound #1 Left,Anterior Lower Leg o ABD pad o Gauze and Kerlix/Conform - conform please Wound #2 Right,Anterior Lower Leg o ABD pad o Gauze and Kerlix/Conform - conform please Wound #3 Right,Midline,Anterior Lower Leg o ABD pad o Gauze and Kerlix/Conform - conform please Dressing Change Frequency Wound #1 Left,Anterior Lower Leg o Change dressing every day. Wound #2 Right,Anterior Lower Leg o Change dressing every day. Wound #3 Right,Midline,Anterior Lower Leg o Change dressing every day. Follow-up Appointments Wound #1 Left,Anterior Lower Leg o Return Appointment in 1 week. Wound #2 Right,Anterior Lower Leg o Return Appointment in 1 week. Wound #3 Right,Midline,Anterior Lower Leg o Return Appointment in 1 week. Edema Control Wound #1 Left,Anterior Lower Leg o Patient to wear own compression stockings Wound #2 Right,Anterior Lower Leg o Patient to wear own compression stockings Pourciau, Sharalee L. ([id number]) Wound #3 Right,Midline,Anterior Lower Leg o Patient to wear own compression stockings Home Health Wound #1 Left,Anterior Lower Leg o Continue Home Health Visits - [facility] o Home Health Nurse may visit PRN to address patientos wound care needs. o FACE TO FACE ENCOUNTER: MEDICARE and MEDICAID PATIENTS: I certify that this patient is under my care and that I had a face-to-face encounter that meets the physician face-to-face encounter requirements with this patient on this  date. The encounter with the patient was in whole or in part for the following MEDICAL CONDITION: (primary reason for Home Healthcare) MEDICAL NECESSITY: I certify, that based on my findings, NURSING services are a medically necessary home health service. HOME BOUND STATUS: I certify that my clinical findings support that this patient is homebound (i.e., Due to illness or injury, pt requires aid of supportive devices such as crutches, cane, wheelchairs, walkers, the use of special transportation or the assistance of another person to leave their place of residence. There is a normal inability to leave the home and doing so requires considerable and taxing effort. Other absences are for medical reasons / religious services and are infrequent or of short duration when for other reasons). o If current dressing causes regression in wound condition, may D/C ordered dressing product/s and apply Normal Saline Moist Dressing daily until next Wound Healing Center / Other MD appointment. Notify Wound Healing Center of regression in wound condition at [phone]. o Please direct any NON-WOUND related issues/requests for orders to patient's Primary Care Physician Wound #  2 Right,Anterior Lower Leg o Continue Home Health Visits - [facility] o Home Health Nurse may visit PRN to address patientos wound care needs. o FACE TO FACE ENCOUNTER: MEDICARE and MEDICAID PATIENTS: I certify that this patient is under my care and that I had a face-to-face encounter that meets the physician face-to-face encounter requirements with this patient on this date. The encounter with the patient was in whole or in part for the following MEDICAL CONDITION: (primary reason for Home Healthcare) MEDICAL NECESSITY: I certify, that based on my findings, NURSING services are a medically necessary home health service. HOME BOUND STATUS: I certify that my clinical findings support that this patient is homebound (i.e., Due to  illness or injury, pt requires aid of supportive devices such as crutches, cane, wheelchairs, walkers, the use of special transportation or the assistance of another person to leave their place of residence. There is a normal inability to leave the home and doing so requires considerable and taxing effort. Other absences are for medical reasons / religious services and are infrequent or of short duration when for other reasons). o If current dressing causes regression in wound condition, may D/C ordered dressing product/s and apply Normal Saline Moist Dressing daily until next Wound Healing Center / Other MD appointment. Notify Wound Healing Center of regression in wound condition at [phone]. o Please direct any NON-WOUND related issues/requests for orders to patient's Primary Care Physician Wound #3 Right,Midline,Anterior Lower Leg o Continue Home Health Visits - [facility] o Home Health Nurse may visit PRN to address patientos wound care needs. SHAKAYLA, HICKOX ([id number]) o FACE TO FACE ENCOUNTER: MEDICARE and MEDICAID PATIENTS: I certify that this patient is under my care and that I had a face-to-face encounter that meets the physician face-to-face encounter requirements with this patient on this date. The encounter with the patient was in whole or in part for the following MEDICAL CONDITION: (primary reason for Home Healthcare) MEDICAL NECESSITY: I certify, that based on my findings, NURSING services are a medically necessary home health service. HOME BOUND STATUS: I certify that my clinical findings support that this patient is homebound (i.e., Due to illness or injury, pt requires aid of supportive devices such as crutches, cane, wheelchairs, walkers, the use of special transportation or the assistance of another person to leave their place of residence. There is a normal inability to leave the home and doing so requires considerable and taxing effort. Other absences are  for medical reasons / religious services and are infrequent or of short duration when for other reasons). o If current dressing causes regression in wound condition, may D/C ordered dressing product/s and apply Normal Saline Moist Dressing daily until next Wound Healing Center / Other MD appointment. Notify Wound Healing Center of regression in wound condition at [phone]. o Please direct any NON-WOUND related issues/requests for orders to patient's Primary Care Physician Medications-please add to medication list. Wound #1 Left,Anterior Lower Leg o Other: - may take Tylenol for mild pain Wound #2 Right,Anterior Lower Leg o Other: - may take Tylenol for mild pain Wound #3 Right,Midline,Anterior Lower Leg o Other: - may take Tylenol for mild pain Electronic Signature(s) Signed: 03/31/2015 12:28:06 PM By: Evlyn Kanner MD, FACS Signed: 03/31/2015 3:55:41 PM By: Curtis Sites Entered By: Curtis Sites on 03/31/2015 11:44:18 Ivins, [name] ([id number]) -------------------------------------------------------------------------------- Problem List Details Patient Name: [name], [name] 03/31/2015 11:00 Date of Service: AM Medical Record [id number] Number: Patient Account Number: [id number] [birth year] (81 y.o. 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 03/31/2015 11:00 Date of Service: AM Medical Record [id number] Number: Patient Account Number: [id number] [birth year] (81 y.o. Treating RN: Date of Birth/Sex: Female) Other Clinician: Primary Care Physician: PATIENT, NO Treating Annalysa Mohammad Referring Physician: Marisue Ivan Physician/Extender: Weeks in Treatment: 4 Subjective Chief Complaint Information obtained from Patient Patient seen for complaints of Non-Healing Wound. pleasant 81 year old patient who had a fall and injured both lower extremities on 01/31/2015 History of Present Illness (HPI) The following HPI elements were documented for the patient's wound: Location: both lower extremities Quality: Patient reports experiencing a dull pain to affected area(s). Severity: Patient states wound (s) are getting better. Duration: Patient has had the wound for < 4 weeks prior to presenting for treatment Timing: Pain in wound is Intermittent (comes and goes Context: The wound occurred when the patient had a fall and lacerated both lower extremities. Modifying Factors: Other treatment(s) tried include:she had sutures applied there for a while and when they were removed the wound opened up. Associated Signs and Symptoms: Patient reports having difficulty standing for long periods. 81 year old female who fell and had an injury to her right forehead, right elbow and both shins on 01/31/2015. She went to the ER at [facility] and had been treated appropriately with sutures being placed to her right and left lower extremity. Also had a CT scan which showed no intracranial hemorrhage and only a large scalp hematoma on the right frontal region.she was put on Keflex for 5 days. she had also received Bactrim for 14 days.Since then she has been seen by her PCP and he had put  her on Augmentin for 14 days and wound culture was obtained. culture reports were reviewed -- she grew an MRSA sensitive to tetracycline, Bactrim and vancomycin. Her past medical history significant for osteoporosis, hypertension, spinal stenosis, right hip replacement, appendectomy and abdomen hysterectomy for ovarian cancer. She has been seen in the past by the vascular surgery group at [facility] and she has an appointment to see them again. She is known to use lymphedema pumps while she has been in her assisted living home but has not been using them for the last month. She does also have compression stockings which she uses intermittently. 03/17/2015 -- She was seen by Dr. Gilda Crease on 03/03/2015 -- he reviewed the patient s case and recommended no surgical intervention. He has recommended compression stockings of the 20-30 mmHg variety and also recommended lymphedema pumps. He will see her back in 3 months. The patient has developed some blisters on both lower extremities and this may be due to the fact that Katayama, Mikayela L. ([id number]) she's had a Kerlix gauze wrap on both lower extremities and is also using the lymphedema pumps. Objective Constitutional Pulse regular. Respirations normal and unlabored. Afebrile. Vitals Time Taken: 11:14 AM, Height: 64 in, Weight: 147 lbs, BMI: 25.2, Temperature: 97.6 F, Pulse: 64 bpm, Respiratory Rate: 18 breaths/min, Blood Pressure: 117/50 mmHg. Eyes Nonicteric. Reactive  to light. Ears, Nose, Mouth, and Throat Lips, teeth, and gums WNL.Marland Kitchen Moist mucosa without lesions . Neck supple and nontender. No palpable supraclavicular or cervical adenopathy. Normal sized without goiter. Respiratory WNL. No retractions.. Cardiovascular Pedal Pulses WNL. the edema both lower extremities is much better. Chest Breasts symmetical and no nipple discharge.. Breast tissue WNL, no masses, lumps, or tenderness.. Lymphatic No adneopathy. No adenopathy. No  adenopathy. Musculoskeletal Adexa without tenderness or enlargement.. Digits and nails w/o clubbing, cyanosis, infection, petechiae, ischemia, or inflammatory conditions.Marland Kitchen Psychiatric Judgement and insight Intact.. No evidence of depression, anxiety, or agitation.. General Notes: Her wounds need some sharp debridement with a curette as they have slough. The blisters have now healed nicely and they are looking good. Integumentary (Hair, Skin) No suspicious lesions. No crepitus or fluctuance. No peri-wound warmth or erythema. No masses.Marland Kitchen Buchler, Daneshia L. ([id number]) Wound #1 status is Open. Original cause of wound was Trauma. The wound is located on the Left,Anterior Lower Leg. The wound measures 6.5cm length x 3.1cm width x 0.5cm depth; 15.826cm^2 area and 7.913cm^3 volume. The wound is limited to skin breakdown. There is no tunneling or undermining noted. There is a medium amount of serosanguineous drainage noted. The wound margin is distinct with the outline attached to the wound base. There is small (1-33%) pink, pale granulation within the wound bed. There is a large (67-100%) amount of necrotic tissue within the wound bed including Eschar and Adherent Slough. The periwound skin appearance exhibited: Localized Edema, Moist. The periwound skin appearance did not exhibit: Callus, Crepitus, Excoriation, Fluctuance, Friable, Induration, Rash, Scarring, Dry/Scaly, Maceration, Atrophie Blanche, Cyanosis, Ecchymosis, Hemosiderin Staining, Mottled, Pallor, Rubor, Erythema. Periwound temperature was noted as No Abnormality. The periwound has tenderness on palpation. Wound #2 status is Open. Original cause of wound was Trauma. The wound is located on the Right,Anterior Lower Leg. The wound measures 2.7cm length x 1.9cm width x 0.3cm depth; 4.029cm^2 area and 1.209cm^3 volume. The wound is limited to skin breakdown. There is no tunneling or undermining noted. There is a medium amount of  serosanguineous drainage noted. The wound margin is distinct with the outline attached to the wound base. There is small (1-33%) red granulation within the wound bed. There is a large (67-100%) amount of necrotic tissue within the wound bed including Adherent Slough. The periwound skin appearance exhibited: Localized Edema, Moist. The periwound skin appearance did not exhibit: Callus, Crepitus, Excoriation, Fluctuance, Friable, Induration, Rash, Scarring, Dry/Scaly, Maceration, Atrophie Blanche, Cyanosis, Ecchymosis, Hemosiderin Staining, Mottled, Pallor, Rubor, Erythema. Periwound temperature was noted as No Abnormality. The periwound has tenderness on palpation. Wound #3 status is Open. Original cause of wound was Blister. The wound is located on the Right,Midline,Anterior Lower Leg. The wound measures 1cm length x 0.7cm width x 0.1cm depth; 0.55cm^2 area and 0.055cm^3 volume. The wound is limited to skin breakdown. There is no tunneling or undermining noted. There is a small amount of serosanguineous drainage noted. The wound margin is indistinct and nonvisible. There is large (67-100%) pink, pale granulation within the wound bed. There is no necrotic tissue within the wound bed. The periwound skin appearance exhibited: Localized Edema, Moist. The periwound skin appearance did not exhibit: Callus, Crepitus, Excoriation, Fluctuance, Friable, Induration, Rash, Scarring, Dry/Scaly, Maceration, Atrophie Blanche, Cyanosis, Ecchymosis, Hemosiderin Staining, Mottled, Pallor, Rubor, Erythema. Periwound temperature was noted as No Abnormality. The periwound has tenderness on palpation. Wound #4 status is Healed - Epithelialized. Original cause of wound was Blister. The wound is located on the Left,Midline,Anterior Lower Leg. The  wound measures 0cm length x 0cm width x 0cm depth; 0cm^2 area and 0cm^3 volume. Assessment Active Problems ICD-10 L97.212 - Non-pressure chronic ulcer of right calf with  fat layer exposed L97.222 - Non-pressure chronic ulcer of left calf with fat layer exposed Ast, Corinda L. ([id number]) I89.0 - Lymphedema, not elsewhere classified The ulcerated areas will need Santyl and we will continue with a light wrap so that she can use her lymphedema pumps regularly. She will come back and see as next week. Procedures Wound #1 Wound #1 is a Skin Tear located on the Left,Anterior Lower Leg . There was a Skin/Subcutaneous Tissue Debridement (40981-19147) debridement with total area of 20.15 sq cm performed by Tristan Schroeder., MD. with the following instrument(s): Curette to remove Viable and Non-Viable tissue/material including Fibrin/Slough and Subcutaneous after achieving pain control using Lidocaine 4% Topical Solution. A time out was conducted prior to the start of the procedure. A Minimum amount of bleeding was controlled with Pressure. The procedure was tolerated well with a pain level of 0 throughout and a pain level of 0 following the procedure. Post Debridement Measurements: 6.5cm length x 3.1cm width x 0.5cm depth; 7.913cm^3 volume. Wound #2 Wound #2 is a Skin Tear located on the Right,Anterior Lower Leg . There was a Skin/Subcutaneous Tissue Debridement ([id number]) debridement with total area of 5.13 sq cm performed by Tristan Schroeder., MD. with the following instrument(s): Curette to remove Viable and Non-Viable tissue/material including Fibrin/Slough and Subcutaneous after achieving pain control using Lidocaine 4% Topical Solution. A time out was conducted prior to the start of the procedure. A Minimum amount of bleeding was controlled with Pressure. The procedure was tolerated well with a pain level of 0 throughout and a pain level of 0 following the procedure. Post Debridement Measurements: 2.7cm length x 1.9cm width x 0.3cm depth; 1.209cm^3 volume. Plan Wound Cleansing: Wound #1 Left,Anterior Lower Leg: Cleanse wound with mild soap and water May  Shower, gently pat wound dry prior to applying new dressing. May shower with protection. Wound #2 Right,Anterior Lower Leg: Cleanse wound with mild soap and water May Shower, gently pat wound dry prior to applying new dressing. May shower with protection. Wound #3 Right,Midline,Anterior Lower Leg: Cleanse wound with mild soap and water Runner, Charmon L. ([id number]) May Shower, gently pat wound dry prior to applying new dressing. May shower with protection. Anesthetic: Wound #1 Left,Anterior Lower Leg: Topical Lidocaine 4% cream applied to wound bed prior to debridement Wound #2 Right,Anterior Lower Leg: Topical Lidocaine 4% cream applied to wound bed prior to debridement Wound #3 Right,Midline,Anterior Lower Leg: Topical Lidocaine 4% cream applied to wound bed prior to debridement Skin Barriers/Peri-Wound Care: Wound #1 Left,Anterior Lower Leg: Skin Prep Wound #2 Right,Anterior Lower Leg: Skin Prep Primary Wound Dressing: Wound #1 Left,Anterior Lower Leg: Santyl Ointment Wound #2 Right,Anterior Lower Leg: Santyl Ointment Wound #3 Right,Midline,Anterior Lower Leg: Xeroform Secondary Dressing: Wound #1 Left,Anterior Lower Leg: ABD pad Gauze and Kerlix/Conform - conform please Wound #2 Right,Anterior Lower Leg: ABD pad Gauze and Kerlix/Conform - conform please Wound #3 Right,Midline,Anterior Lower Leg: ABD pad Gauze and Kerlix/Conform - conform please Dressing Change Frequency: Wound #1 Left,Anterior Lower Leg: Change dressing every day. Wound #2 Right,Anterior Lower Leg: Change dressing every day. Wound #3 Right,Midline,Anterior Lower Leg: Change dressing every day. Follow-up Appointments: Wound #1 Left,Anterior Lower Leg: Return Appointment in 1 week. Wound #2 Right,Anterior Lower Leg: Return Appointment in 1 week. Wound #3 Right,Midline,Anterior Lower Leg: Return Appointment in 1 week. Edema Control:  Wound #1 Left,Anterior Lower Leg: Patient to wear own  compression stockings Wound #2 Right,Anterior Lower Leg: Patient to wear own compression stockings Wound #3 Right,Midline,Anterior Lower Leg: Macchi, Leah L. ([id number]) Patient to wear own compression stockings Home Health: Wound #1 Left,Anterior Lower Leg: Continue Home Health Visits - [facility] Nurse may visit PRN to address patient s wound care needs. FACE TO FACE ENCOUNTER: MEDICARE and MEDICAID PATIENTS: I certify that this patient is under my care and that I had a face-to-face encounter that meets the physician face-to-face encounter requirements with this patient on this date. The encounter with the patient was in whole or in part for the following MEDICAL CONDITION: (primary reason for Home Healthcare) MEDICAL NECESSITY: I certify, that based on my findings, NURSING services are a medically necessary home health service. HOME BOUND STATUS: I certify that my clinical findings support that this patient is homebound (i.e., Due to illness or injury, pt requires aid of supportive devices such as crutches, cane, wheelchairs, walkers, the use of special transportation or the assistance of another person to leave their place of residence. There is a normal inability to leave the home and doing so requires considerable and taxing effort. Other absences are for medical reasons / religious services and are infrequent or of short duration when for other reasons). If current dressing causes regression in wound condition, may D/C ordered dressing product/s and apply Normal Saline Moist Dressing daily until next Wound Healing Center / Other MD appointment. Notify Wound Healing Center of regression in wound condition at [phone]. Please direct any NON-WOUND related issues/requests for orders to patient's Primary Care Physician Wound #2 Right,Anterior Lower Leg: Continue Home Health Visits - [facility] Nurse may visit PRN to address patient s wound care needs. FACE TO  FACE ENCOUNTER: MEDICARE and MEDICAID PATIENTS: I certify that this patient is under my care and that I had a face-to-face encounter that meets the physician face-to-face encounter requirements with this patient on this date. The encounter with the patient was in whole or in part for the following MEDICAL CONDITION: (primary reason for Home Healthcare) MEDICAL NECESSITY: I certify, that based on my findings, NURSING services are a medically necessary home health service. HOME BOUND STATUS: I certify that my clinical findings support that this patient is homebound (i.e., Due to illness or injury, pt requires aid of supportive devices such as crutches, cane, wheelchairs, walkers, the use of special transportation or the assistance of another person to leave their place of residence. There is a normal inability to leave the home and doing so requires considerable and taxing effort. Other absences are for medical reasons / religious services and are infrequent or of short duration when for other reasons). If current dressing causes regression in wound condition, may D/C ordered dressing product/s and apply Normal Saline Moist Dressing daily until next Wound Healing Center / Other MD appointment. Notify Wound Healing Center of regression in wound condition at [phone]. Please direct any NON-WOUND related issues/requests for orders to patient's Primary Care Physician Wound #3 Right,Midline,Anterior Lower Leg: Continue Home Health Visits - [facility] Nurse may visit PRN to address patient s wound care needs. FACE TO FACE ENCOUNTER: MEDICARE and MEDICAID PATIENTS: I certify that this patient is under my care and that I had a face-to-face encounter that meets the physician face-to-face encounter requirements with this patient on this date. The encounter with the patient was in whole or in part for the following MEDICAL CONDITION: (primary reason  for Home Healthcare) MEDICAL NECESSITY: I  certify, that based on my findings, NURSING services are a medically necessary home health service. HOME BOUND STATUS: I certify that my clinical findings support that this patient is homebound (i.e., Due to illness or injury, pt requires aid of supportive devices such as crutches, cane, wheelchairs, walkers, the use of special transportation or the assistance of another person to leave their place of residence. There is a normal inability to leave the home and doing so requires considerable and taxing effort. Other absences are for medical reasons / religious services and are infrequent or of short duration when for other reasons). If current dressing causes regression in wound condition, may D/C ordered dressing product/s and apply Normal Saline Moist Dressing daily until next Wound Healing Center / Other MD appointment. Notify Wound Leder, Leah L. ([id number]) Healing Center of regression in wound condition at [phone]. Please direct any NON-WOUND related issues/requests for orders to patient's Primary Care Physician Medications-please add to medication list.: Wound #1 Left,Anterior Lower Leg: Other: - may take Tylenol for mild pain Wound #2 Right,Anterior Lower Leg: Other: - may take Tylenol for mild pain Wound #3 Right,Midline,Anterior Lower Leg: Other: - may take Tylenol for mild pain The ulcerated areas will need Santyl and we will continue with a light wrap so that she can use her lymphedema pumps regularly. She will come back and see as next week. Electronic Signature(s) Signed: 04/01/2015 8:55:06 AM By: Evlyn Kanner MD, FACS Previous Signature: 03/31/2015 11:54:45 AM Version By: Evlyn Kanner MD, FACS Entered By: Evlyn Kanner on 04/01/2015 08:55:06 [name], [name] ([id number]) -------------------------------------------------------------------------------- SuperBill Details Patient Name: [name] Date of Service: 03/31/2015 Medical Record Number: [id number] Patient  Account Number: [id number] Date of Birth/Sex: [birth year] (81 y.o. Female) Treating RN: Primary Care Physician: PATIENT, NO Other Clinician: Referring Physician: Marisue Ivan Treating Physician/Extender: Rudene Re in Treatment: 4 Diagnosis Coding ICD-10 Codes Code Description [phone] Non-pressure chronic ulcer of right calf with fat layer exposed L97.222 Non-pressure chronic ulcer of left calf with fat layer exposed I89.0 Lymphedema, not elsewhere classified Facility Procedures CPT4 Code: 13244010 Description: 11042 - DEB SUBQ TISSUE 20 SQ CM/< ICD-10 Description Diagnosis L97.212 Non-pressure chronic ulcer of right calf with fat L97.222 Non-pressure chronic ulcer of left calf with fat l I89.0 Lymphedema, not elsewhere classified Modifier: layer exposed ayer exposed Quantity: 1 CPT4 Code: [id number] Description: 11045 - DEB SUBQ TISS EA ADDL 20CM ICD-10 Description Diagnosis L97.212 Non-pressure chronic ulcer of right calf with fat L97.222 Non-pressure chronic ulcer of left calf with fat l I89.0 Lymphedema, not elsewhere classified Modifier: layer exposed ayer exposed Quantity: 1 Physician Procedures CPT4 Code: [id number] Description: 11042 - WC PHYS SUBQ TISS 20 SQ CM ICD-10 Description Diagnosis L97.212 Non-pressure chronic ulcer of right calf with fat l L97.222 Non-pressure chronic ulcer of left calf with fat la I89.0 Lymphedema, not elsewhere classified Modifier: ayer exposed yer exposed Quantity: 1 CPT4 Code: [id number] Mormile, Fotini Description: 11045 - WC PHYS SUBQ TISS EA ADDL 20 CM ICD-10 Description Diagnosis L. ([id number]) Modifier: Quantity: 1 Electronic Signature(s) Signed: 03/31/2015 11:55:01 AM By: Evlyn Kanner MD, FACS Entered By: Evlyn Kanner on 03/31/2015 11:55:01

## 2015-04-07 ENCOUNTER — Encounter: Payer: Medicare Other | Admitting: Surgery

## 2015-04-07 DIAGNOSIS — L97212 Non-pressure chronic ulcer of right calf with fat layer exposed: Secondary | ICD-10-CM | POA: Diagnosis not present

## 2015-04-08 NOTE — Progress Notes (Addendum)
[name], [name] ([id number]) Visit Report for 04/07/2015 Chief Complaint Document Details Patient Name: [name], [name] 04/07/2015 11:00 Date of Service: AM Medical Record [id number] Number: Patient Account Number: [id number] 10/21/1912 (81 y.o. Treating RN: Curtis Sites Date of Birth/Sex: Female) Other Clinician: Primary Care Physician: PATIENT, NO Treating Ysela Hettinger Referring Physician: Marisue Ivan Physician/Extender: Weeks in Treatment: 5 Information Obtained from: Patient Chief Complaint Patient seen for complaints of Non-Healing Wound. pleasant 81 year old patient who had a fall and injured both lower extremities on 01/31/2015 Electronic Signature(s) Signed: 04/07/2015 11:40:00 AM By: Evlyn Kanner MD, FACS Entered By: Evlyn Kanner on 04/07/2015 11:40:00 [name], [name] ([id number]) -------------------------------------------------------------------------------- Debridement Details Patient Name: [name] 04/07/2015 11:00 Date of Service: AM Medical Record [id number] Number: Patient Account Number: [id number] 1913-02-19 (81 y.o. Treating RN: Curtis Sites Date of Birth/Sex: Female) Other Clinician: Primary Care Physician: PATIENT, NO Treating Merna Baldi Referring Physician: Marisue Ivan Physician/Extender: Weeks in Treatment: 5 Debridement Performed for Wound #1 Left,Anterior Lower Leg Assessment: Performed By: Physician Tristan Schroeder., MD Debridement: Debridement Pre-procedure Yes Verification/Time Out Taken: Start Time: 11:33 Pain Control: Lidocaine 4% Topical Solution Level: Skin/Subcutaneous Tissue Total Area Debrided (L x 6.5 (cm) x 2.6 (cm) = 16.9 (cm) W): Tissue and other Viable, Non-Viable, Fibrin/Slough, Subcutaneous material debrided: Instrument: Curette Bleeding: Minimum Hemostasis Achieved: Pressure End Time: 11:35 Procedural Pain: 0 Post Procedural Pain: 0 Response to Treatment: Procedure was tolerated  well Post Debridement Measurements of Total Wound Length: (cm) 6.5 Width: (cm) 2.6 Depth: (cm) 0.5 Volume: (cm) 6.637 Electronic Signature(s) Signed: 04/07/2015 11:39:44 AM By: Evlyn Kanner MD, FACS Signed: 04/07/2015 5:44:32 PM By: Curtis Sites Entered By: Evlyn Kanner on 04/07/2015 11:39:44 [name], [name] ([id number]) -------------------------------------------------------------------------------- Debridement Details Patient Name: [name] 04/07/2015 11:00 Date of Service: AM Medical Record [id number] Number: Patient Account Number: [id number] 20-Apr-1913 (81 y.o. Treating RN: Curtis Sites Date of Birth/Sex: Female) Other Clinician: Primary Care Physician: PATIENT, NO Treating Lorna Strother Referring Physician: Marisue Ivan Physician/Extender: Weeks in Treatment: 5 Debridement Performed for Wound #2 Right,Anterior Lower Leg Assessment: Performed By: Physician Tristan Schroeder., MD Debridement: Debridement Pre-procedure Yes Verification/Time Out Taken: Start Time: 11:35 Pain Control: Lidocaine 4% Topical Solution Level: Skin/Subcutaneous Tissue Total Area Debrided (L x 3.3 (cm) x 1.6 (cm) = 5.28 (cm) W): Tissue and other Viable, Non-Viable, Fibrin/Slough, Subcutaneous material debrided: Instrument: Curette Bleeding: Minimum Hemostasis Achieved: Pressure End Time: 11:36 Procedural Pain: 0 Post Procedural Pain: 0 Response to Treatment: Procedure was tolerated well Post Debridement Measurements of Total Wound Length: (cm) 3.3 Width: (cm) 1.6 Depth: (cm) 0.3 Volume: (cm) 1.244 Electronic Signature(s) Signed: 04/07/2015 11:39:52 AM By: Evlyn Kanner MD, FACS Signed: 04/07/2015 5:44:32 PM By: Curtis Sites Entered By: Evlyn Kanner on 04/07/2015 11:39:52 [name], [name] ([id number]) -------------------------------------------------------------------------------- HPI Details Patient Name: [name] 04/07/2015 11:00 Date of  Service: AM Medical Record [id number] Number: Patient Account Number: [id number] 04-06-1913 (81 y.o. Treating RN: Curtis Sites Date of Birth/Sex: Female) Other Clinician: Primary Care Physician: PATIENT, NO Treating Carsin Randazzo Referring Physician: Marisue Ivan Physician/Extender: Weeks in Treatment: 5 History of Present Illness Location: both lower extremities Quality: Patient reports experiencing a dull pain to affected area(s). Severity: Patient states wound (s) are getting better. Duration: Patient has had the wound for < 4 weeks prior to presenting for treatment Timing: Pain in wound is Intermittent (comes and goes Context: The wound occurred when the patient had a fall and lacerated both lower extremities. Modifying Factors: Other treatment(s) tried include:she had sutures applied  there for a while and when they were removed the wound opened up. Associated Signs and Symptoms: Patient reports having difficulty standing for long periods. HPI Description: 81 year old female who fell and had an injury to her right forehead, right elbow and both shins on 01/31/2015. She went to the ER at [facility] and had been treated appropriately with sutures being placed to her right and left lower extremity. Also had a CT scan which showed no intracranial hemorrhage and only a large scalp hematoma on the right frontal region.she was put on Keflex for 5 days. she had also received Bactrim for 14 days.Since then she has been seen by her PCP and he had put her on Augmentin for 14 days and wound culture was obtained. culture reports were reviewed -- she grew an MRSA sensitive to tetracycline, Bactrim and vancomycin. Her past medical history significant for osteoporosis, hypertension, spinal stenosis, right hip replacement, appendectomy and abdomen hysterectomy for ovarian cancer. She has been seen in the past by the vascular surgery group at [facility] and she has  an appointment to see them again. She is known to use lymphedema pumps while she has been in her assisted living home but has not been using them for the last month. She does also have compression stockings which she uses intermittently. 03/17/2015 -- She was seen by Dr. Gilda Crease on 03/03/2015 -- he reviewed the patientos case and recommended no surgical intervention. He has recommended compression stockings of the 20-30 mmHg variety and also recommended lymphedema pumps. He will see her back in 3 months. The patient has developed some blisters on both lower extremities and this may be due to the fact that she's had a Kerlix gauze wrap on both lower extremities and is also using the lymphedema pumps. Electronic Signature(s) Signed: 04/07/2015 11:40:06 AM By: Evlyn Kanner MD, FACS Entered By: Evlyn Kanner on 04/07/2015 11:40:06 [name], [name] ([id number]) -------------------------------------------------------------------------------- Physical Exam Details Patient Name: [name], [name] 04/07/2015 11:00 Date of Service: AM Medical Record [id number] Number: Patient Account Number: [id number] 10/01/1912 (81 y.o. Treating RN: Curtis Sites Date of Birth/Sex: Female) Other Clinician: Primary Care Physician: PATIENT, NO Treating Kerra Guilfoil Referring Physician: Marisue Ivan Physician/Extender: Weeks in Treatment: 5 Constitutional . Pulse regular. Respirations normal and unlabored. Afebrile. . Eyes Nonicteric. Reactive to light. Ears, Nose, Mouth, and Throat Lips, teeth, and gums WNL.Marland Kitchen Moist mucosa without lesions . Neck supple and nontender. No palpable supraclavicular or cervical adenopathy. Normal sized without goiter. Respiratory WNL. No retractions.. Cardiovascular Pedal Pulses WNL. No clubbing, cyanosis or edema. Chest Breasts symmetical and no nipple discharge.. Breast tissue WNL, no masses, lumps, or tenderness.. Lymphatic No adneopathy. No adenopathy. No  adenopathy. Musculoskeletal Adexa without tenderness or enlargement.. Digits and nails w/o clubbing, cyanosis, infection, petechiae, ischemia, or inflammatory conditions.. Integumentary (Hair, Skin) No suspicious lesions. No crepitus or fluctuance. No peri-wound warmth or erythema. No masses.Marland Kitchen Psychiatric Judgement and insight Intact.. No evidence of depression, anxiety, or agitation.. Notes She has some slough over her wounds and some at the base of ulcerations and sharp debridement has been done with a curette and they seem to look much better after the debris was removed. Electronic Signature(s) Signed: 04/07/2015 11:40:55 AM By: Evlyn Kanner MD, FACS Entered By: Evlyn Kanner on 04/07/2015 11:40:55 Buenger, [name] ([id number]) -------------------------------------------------------------------------------- Physician Orders Details Patient Name: [name], [name] 04/07/2015 11:00 Date of Service: AM Medical Record [id number] Number: Patient Account Number: [id number] 1913-06-07 (81 y.o. Treating RN: Curtis Sites Date of Birth/Sex: Female) Other  Clinician: Primary Care Physician: PATIENT, NO Treating Selenne Coggin Referring Physician: Marisue Ivan Physician/Extender: Weeks in Treatment: 5 Verbal / Phone Orders: Yes Clinician: Curtis Sites Read Back and Verified: Yes Diagnosis Coding Wound Cleansing Wound #1 Left,Anterior Lower Leg o Cleanse wound with mild soap and water o May Shower, gently pat wound dry prior to applying new dressing. o May shower with protection. Wound #2 Right,Anterior Lower Leg o Cleanse wound with mild soap and water o May Shower, gently pat wound dry prior to applying new dressing. o May shower with protection. Wound #3 Right,Midline,Anterior Lower Leg o Cleanse wound with mild soap and water o May Shower, gently pat wound dry prior to applying new dressing. o May shower with protection. Anesthetic Wound #1  Left,Anterior Lower Leg o Topical Lidocaine 4% cream applied to wound bed prior to debridement Wound #2 Right,Anterior Lower Leg o Topical Lidocaine 4% cream applied to wound bed prior to debridement Wound #3 Right,Midline,Anterior Lower Leg o Topical Lidocaine 4% cream applied to wound bed prior to debridement Skin Barriers/Peri-Wound Care Wound #1 Left,Anterior Lower Leg o Skin Prep Wound #2 Right,Anterior Lower Leg o Skin Prep Primary Wound Dressing Wound #1 Left,Anterior Lower Leg Schowalter, Kaneshia L. ([id number]) o Santyl Ointment Wound #2 Right,Anterior Lower Leg o Santyl Ointment Wound #3 Right,Midline,Anterior Lower Leg o Xeroform Secondary Dressing Wound #1 Left,Anterior Lower Leg o ABD pad o Gauze and Kerlix/Conform - conform please Wound #2 Right,Anterior Lower Leg o ABD pad o Gauze and Kerlix/Conform - conform please Wound #3 Right,Midline,Anterior Lower Leg o ABD pad o Gauze and Kerlix/Conform - conform please Dressing Change Frequency Wound #1 Left,Anterior Lower Leg o Change Dressing Monday, Wednesday, Friday Wound #2 Right,Anterior Lower Leg o Change Dressing Monday, Wednesday, Friday Wound #3 Right,Midline,Anterior Lower Leg o Change Dressing Monday, Wednesday, Friday Follow-up Appointments Wound #1 Left,Anterior Lower Leg o Return Appointment in 1 week. Wound #2 Right,Anterior Lower Leg o Return Appointment in 1 week. Wound #3 Right,Midline,Anterior Lower Leg o Return Appointment in 1 week. Edema Control Wound #1 Left,Anterior Lower Leg o Patient to wear own compression stockings Wound #2 Right,Anterior Lower Leg o Patient to wear own compression stockings [name], Leah L. ([id number]) Wound #3 Right,Midline,Anterior Lower Leg o Patient to wear own compression stockings Home Health Wound #1 Left,Anterior Lower Leg o Continue Home Health Visits - [facility] o Home Health Nurse may visit PRN to address  patientos wound care needs. o FACE TO FACE ENCOUNTER: MEDICARE and MEDICAID PATIENTS: I certify that this patient is under my care and that I had a face-to-face encounter that meets the physician face-to-face encounter requirements with this patient on this date. The encounter with the patient was in whole or in part for the following MEDICAL CONDITION: (primary reason for Home Healthcare) MEDICAL NECESSITY: I certify, that based on my findings, NURSING services are a medically necessary home health service. HOME BOUND STATUS: I certify that my clinical findings support that this patient is homebound (i.e., Due to illness or injury, pt requires aid of supportive devices such as crutches, cane, wheelchairs, walkers, the use of special transportation or the assistance of another person to leave their place of residence. There is a normal inability to leave the home and doing so requires considerable and taxing effort. Other absences are for medical reasons / religious services and are infrequent or of short duration when for other reasons). o If current dressing causes regression in wound condition, may D/C ordered dressing product/s and apply Normal Saline Moist Dressing daily  until next Wound Healing Center / Other MD appointment. Notify Wound Healing Center of regression in wound condition at [phone]. o Please direct any NON-WOUND related issues/requests for orders to patient's Primary Care Physician Wound #2 Right,Anterior Lower Leg o Continue Home Health Visits - [facility] o Home Health Nurse may visit PRN to address patientos wound care needs. o FACE TO FACE ENCOUNTER: MEDICARE and MEDICAID PATIENTS: I certify that this patient is under my care and that I had a face-to-face encounter that meets the physician face-to-face encounter requirements with this patient on this date. The encounter with the patient was in whole or in part for the following MEDICAL CONDITION:  (primary reason for Home Healthcare) MEDICAL NECESSITY: I certify, that based on my findings, NURSING services are a medically necessary home health service. HOME BOUND STATUS: I certify that my clinical findings support that this patient is homebound (i.e., Due to illness or injury, pt requires aid of supportive devices such as crutches, cane, wheelchairs, walkers, the use of special transportation or the assistance of another person to leave their place of residence. There is a normal inability to leave the home and doing so requires considerable and taxing effort. Other absences are for medical reasons / religious services and are infrequent or of short duration when for other reasons). o If current dressing causes regression in wound condition, may D/C ordered dressing product/s and apply Normal Saline Moist Dressing daily until next Wound Healing Center / Other MD appointment. Notify Wound Healing Center of regression in wound condition at [phone]. o Please direct any NON-WOUND related issues/requests for orders to patient's Primary Care Physician Wound #3 Right,Midline,Anterior Lower Leg o Continue Home Health Visits - [facility] o Home Health Nurse may visit PRN to address patientos wound care needs. ALIAH, ERIKSSON ([id number]) o FACE TO FACE ENCOUNTER: MEDICARE and MEDICAID PATIENTS: I certify that this patient is under my care and that I had a face-to-face encounter that meets the physician face-to-face encounter requirements with this patient on this date. The encounter with the patient was in whole or in part for the following MEDICAL CONDITION: (primary reason for Home Healthcare) MEDICAL NECESSITY: I certify, that based on my findings, NURSING services are a medically necessary home health service. HOME BOUND STATUS: I certify that my clinical findings support that this patient is homebound (i.e., Due to illness or injury, pt requires aid of supportive devices such  as crutches, cane, wheelchairs, walkers, the use of special transportation or the assistance of another person to leave their place of residence. There is a normal inability to leave the home and doing so requires considerable and taxing effort. Other absences are for medical reasons / religious services and are infrequent or of short duration when for other reasons). o If current dressing causes regression in wound condition, may D/C ordered dressing product/s and apply Normal Saline Moist Dressing daily until next Wound Healing Center / Other MD appointment. Notify Wound Healing Center of regression in wound condition at [phone]. o Please direct any NON-WOUND related issues/requests for orders to patient's Primary Care Physician Medications-please add to medication list. Wound #1 Left,Anterior Lower Leg o Other: - may take Tylenol for mild pain Wound #2 Right,Anterior Lower Leg o Other: - may take Tylenol for mild pain Wound #3 Right,Midline,Anterior Lower Leg o Other: - may take Tylenol for mild pain Electronic Signature(s) Signed: 04/07/2015 11:58:05 AM By: Curtis Sites Signed: 04/07/2015 1:29:04 PM By: Evlyn Kanner MD, FACS Entered By: Curtis Sites on 04/07/2015 11:58:04  [name], [name] ([id number]) -------------------------------------------------------------------------------- Problem List Details Patient Name: [name], [name] 04/07/2015 11:00 Date of Service: AM Medical Record [id number] Number: Patient Account Number: [id number] 1912/12/08 (81 y.o. 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 04/07/2015 11:00 Date of Service: AM Medical Record [id number] Number: Patient Account Number: [id number] Jan 18, 1913 (81 y.o. Treating RN: Curtis Sites Date of Birth/Sex: Female) Other Clinician: Primary Care Physician: PATIENT, NO Treating Mariem Skolnick Referring Physician: Marisue Ivan Physician/Extender: Weeks in Treatment: 5 Subjective Chief Complaint Information obtained from Patient Patient seen for complaints of Non-Healing Wound. pleasant 81 year old patient who had a fall and injured both lower extremities on 01/31/2015 History of Present Illness (HPI) The following HPI elements were documented for the patient's wound: Location: both lower extremities Quality: Patient reports experiencing a dull pain to affected area(s). Severity: Patient states wound (s) are getting better. Duration: Patient has had the wound for < 4 weeks prior to presenting for treatment Timing: Pain in wound is Intermittent (comes and goes Context: The wound occurred when the patient had a fall and lacerated both lower extremities. Modifying Factors: Other treatment(s) tried include:she had sutures applied there for a while and when they were removed the wound opened up. Associated Signs and Symptoms: Patient reports having difficulty standing for long periods. 81 year old female who fell and had an injury to her right forehead, right elbow and both shins on 01/31/2015. She went to the ER at [facility] and had been treated appropriately with sutures being  placed to her right and left lower extremity. Also had a CT scan which showed no intracranial hemorrhage and only a large scalp hematoma on the right frontal region.she was put on Keflex for 5 days. she had also received Bactrim for 14 days.Since then she has been seen by her PCP and he had put her on Augmentin for 14 days and wound culture was obtained. culture reports were reviewed -- she grew an MRSA sensitive to tetracycline, Bactrim and vancomycin. Her past medical history significant for osteoporosis, hypertension, spinal stenosis, right hip replacement, appendectomy and abdomen hysterectomy for ovarian cancer. She has been seen in the past by the vascular surgery group at [facility] and she has an appointment to see them again. She is known to use lymphedema pumps while she has been in her assisted living home but has not been using them for the last month. She does also have compression stockings which she uses intermittently. 03/17/2015 -- She was seen by Dr. Gilda Crease on 03/03/2015 -- he reviewed the patient s case and recommended no surgical intervention. He has recommended compression stockings of the 20-30 mmHg variety and also recommended lymphedema pumps. He will see her back in 3 months. The patient has developed some blisters on both lower extremities and this may be due to the fact that Creson, Ellisyn L. ([id number]) she's had a Kerlix gauze wrap on both lower extremities and is also using the lymphedema pumps.  Objective Constitutional Pulse regular. Respirations normal and unlabored. Afebrile. Vitals Time Taken: 11:06 AM, Height: 64 in, Weight: 147 lbs, BMI: 25.2, Temperature: 97.8 F, Pulse: 68 bpm, Respiratory Rate: 18 breaths/min, Blood Pressure: 138/54 mmHg. Eyes Nonicteric. Reactive to light. Ears, Nose, Mouth, and Throat Lips, teeth, and gums WNL.Marland Kitchen Moist mucosa without lesions . Neck supple and nontender. No palpable supraclavicular or cervical adenopathy.  Normal sized without goiter. Respiratory WNL. No retractions.. Cardiovascular Pedal Pulses WNL. No clubbing, cyanosis or edema. Chest Breasts symmetical and no nipple discharge.. Breast tissue WNL, no masses, lumps, or tenderness.. Lymphatic No adneopathy. No adenopathy. No adenopathy. Musculoskeletal Adexa without tenderness or enlargement.. Digits and nails w/o clubbing, cyanosis, infection, petechiae, ischemia, or inflammatory conditions.Marland Kitchen Psychiatric Judgement and insight Intact.. No evidence of depression, anxiety, or agitation.. General Notes: She has some slough over her wounds and some at the base of ulcerations and sharp debridement has been done with a curette and they seem to look much better after the debris was removed. Integumentary (Hair, Skin) Mahon, Rasheedah L. ([id number]) No suspicious lesions. No crepitus or fluctuance. No peri-wound warmth or erythema. No masses.. Wound #1 status is Open. Original cause of wound was Trauma. The wound is located on the Left,Anterior Lower Leg. The wound measures 6.5cm length x 2.6cm width x 0.5cm depth; 13.273cm^2 area and 6.637cm^3 volume. The wound is limited to skin breakdown. There is no tunneling or undermining noted. There is a medium amount of serosanguineous drainage noted. The wound margin is distinct with the outline attached to the wound base. There is medium (34-66%) pink, pale granulation within the wound bed. There is a medium (34-66%) amount of necrotic tissue within the wound bed including Eschar and Adherent Slough. The periwound skin appearance exhibited: Localized Edema, Moist. The periwound skin appearance did not exhibit: Callus, Crepitus, Excoriation, Fluctuance, Friable, Induration, Rash, Scarring, Dry/Scaly, Maceration, Atrophie Blanche, Cyanosis, Ecchymosis, Hemosiderin Staining, Mottled, Pallor, Rubor, Erythema. Periwound temperature was noted as No Abnormality. The periwound has tenderness on palpation. Wound  #2 status is Open. Original cause of wound was Trauma. The wound is located on the Right,Anterior Lower Leg. The wound measures 3.3cm length x 1.6cm width x 0.3cm depth; 4.147cm^2 area and 1.244cm^3 volume. The wound is limited to skin breakdown. There is no tunneling or undermining noted. There is a medium amount of serosanguineous drainage noted. The wound margin is distinct with the outline attached to the wound base. There is medium (34-66%) red granulation within the wound bed. There is a medium (34-66%) amount of necrotic tissue within the wound bed including Adherent Slough. The periwound skin appearance exhibited: Localized Edema, Moist. The periwound skin appearance did not exhibit: Callus, Crepitus, Excoriation, Fluctuance, Friable, Induration, Rash, Scarring, Dry/Scaly, Maceration, Atrophie Blanche, Cyanosis, Ecchymosis, Hemosiderin Staining, Mottled, Pallor, Rubor, Erythema. Periwound temperature was noted as No Abnormality. The periwound has tenderness on palpation. Wound #3 status is Open. Original cause of wound was Blister. The wound is located on the Right,Midline,Anterior Lower Leg. The wound measures 2.6cm length x 0.6cm width x 0.1cm depth; 1.225cm^2 area and 0.123cm^3 volume. The wound is limited to skin breakdown. There is no tunneling or undermining noted. There is a small amount of serosanguineous drainage noted. The wound margin is indistinct and nonvisible. There is large (67-100%) pink, pale granulation within the wound bed. There is no necrotic tissue within the wound bed. The periwound skin appearance exhibited: Localized Edema, Moist. The periwound skin appearance did not exhibit: Callus, Crepitus, Excoriation, Fluctuance, Friable, Induration, Rash, Scarring, Dry/Scaly,  Maceration, Atrophie Blanche, Cyanosis, Ecchymosis, Hemosiderin Staining, Mottled, Pallor, Rubor, Erythema. Periwound temperature was noted as No Abnormality. The periwound has tenderness on  palpation. Assessment Active Problems ICD-10 L97.212 - Non-pressure chronic ulcer of right calf with fat layer exposed L97.222 - Non-pressure chronic ulcer of left calf with fat layer exposed I89.0 - Lymphedema, not elsewhere classified Cobbins, Ronnika L. ([id number]) We will persist with Santyl ointment locally over the wounds for a few more days and see if we can switch her over to [facility] shortly. Management discussed with her caretakers. Procedures Wound #1 Wound #1 is a Skin Tear located on the Left,Anterior Lower Leg . There was a Skin/Subcutaneous Tissue Debridement ([id number]) debridement with total area of 16.9 sq cm performed by Tristan Schroeder., MD. with the following instrument(s): Curette to remove Viable and Non-Viable tissue/material including Fibrin/Slough and Subcutaneous after achieving pain control using Lidocaine 4% Topical Solution. A time out was conducted prior to the start of the procedure. A Minimum amount of bleeding was controlled with Pressure. The procedure was tolerated well with a pain level of 0 throughout and a pain level of 0 following the procedure. Post Debridement Measurements: 6.5cm length x 2.6cm width x 0.5cm depth; 6.637cm^3 volume. Wound #2 Wound #2 is a Skin Tear located on the Right,Anterior Lower Leg . There was a Skin/Subcutaneous Tissue Debridement ([id number]) debridement with total area of 5.28 sq cm performed by Tristan Schroeder., MD. with the following instrument(s): Curette to remove Viable and Non-Viable tissue/material including Fibrin/Slough and Subcutaneous after achieving pain control using Lidocaine 4% Topical Solution. A time out was conducted prior to the start of the procedure. A Minimum amount of bleeding was controlled with Pressure. The procedure was tolerated well with a pain level of 0 throughout and a pain level of 0 following the procedure. Post Debridement Measurements: 3.3cm length x 1.6cm width x 0.3cm depth;  1.244cm^3 volume. Plan Wound Cleansing: Wound #1 Left,Anterior Lower Leg: Cleanse wound with mild soap and water May Shower, gently pat wound dry prior to applying new dressing. May shower with protection. Wound #2 Right,Anterior Lower Leg: Cleanse wound with mild soap and water May Shower, gently pat wound dry prior to applying new dressing. May shower with protection. Wound #3 Right,Midline,Anterior Lower Leg: Cleanse wound with mild soap and water May Shower, gently pat wound dry prior to applying new dressing. May shower with protection. Anesthetic: Wound #1 Left,Anterior Lower Leg: Lenhoff, Shelbey L. ([id number]) Topical Lidocaine 4% cream applied to wound bed prior to debridement Wound #2 Right,Anterior Lower Leg: Topical Lidocaine 4% cream applied to wound bed prior to debridement Wound #3 Right,Midline,Anterior Lower Leg: Topical Lidocaine 4% cream applied to wound bed prior to debridement Skin Barriers/Peri-Wound Care: Wound #1 Left,Anterior Lower Leg: Skin Prep Wound #2 Right,Anterior Lower Leg: Skin Prep Primary Wound Dressing: Wound #1 Left,Anterior Lower Leg: Santyl Ointment Wound #2 Right,Anterior Lower Leg: Santyl Ointment Wound #3 Right,Midline,Anterior Lower Leg: Xeroform Secondary Dressing: Wound #1 Left,Anterior Lower Leg: ABD pad Gauze and Kerlix/Conform - conform please Wound #2 Right,Anterior Lower Leg: ABD pad Gauze and Kerlix/Conform - conform please Wound #3 Right,Midline,Anterior Lower Leg: ABD pad Gauze and Kerlix/Conform - conform please Dressing Change Frequency: Wound #1 Left,Anterior Lower Leg: Change Dressing Monday, Wednesday, Friday Wound #2 Right,Anterior Lower Leg: Change Dressing Monday, Wednesday, Friday Wound #3 Right,Midline,Anterior Lower Leg: Change Dressing Monday, Wednesday, Friday Follow-up Appointments: Wound #1 Left,Anterior Lower Leg: Return Appointment in 1 week. Wound #2 Right,Anterior Lower Leg: Return  Appointment in 1  week. Wound #3 Right,Midline,Anterior Lower Leg: Return Appointment in 1 week. Edema Control: Wound #1 Left,Anterior Lower Leg: Patient to wear own compression stockings Wound #2 Right,Anterior Lower Leg: Patient to wear own compression stockings Wound #3 Right,Midline,Anterior Lower Leg: Patient to wear own compression stockings Home Health: Wound #1 Left,Anterior Lower Leg: Continue Home Health Visits - [place], [place] ([id number]) Home Health Nurse may visit PRN to address patient s wound care needs. FACE TO FACE ENCOUNTER: MEDICARE and MEDICAID PATIENTS: I certify that this patient is under my care and that I had a face-to-face encounter that meets the physician face-to-face encounter requirements with this patient on this date. The encounter with the patient was in whole or in part for the following MEDICAL CONDITION: (primary reason for Home Healthcare) MEDICAL NECESSITY: I certify, that based on my findings, NURSING services are a medically necessary home health service. HOME BOUND STATUS: I certify that my clinical findings support that this patient is homebound (i.e., Due to illness or injury, pt requires aid of supportive devices such as crutches, cane, wheelchairs, walkers, the use of special transportation or the assistance of another person to leave their place of residence. There is a normal inability to leave the home and doing so requires considerable and taxing effort. Other absences are for medical reasons / religious services and are infrequent or of short duration when for other reasons). If current dressing causes regression in wound condition, may D/C ordered dressing product/s and apply Normal Saline Moist Dressing daily until next Wound Healing Center / Other MD appointment. Notify Wound Healing Center of regression in wound condition at [phone]. Please direct any NON-WOUND related issues/requests for orders to patient's Primary Care  Physician Wound #2 Right,Anterior Lower Leg: Continue Home Health Visits - [facility] Nurse may visit PRN to address patient s wound care needs. FACE TO FACE ENCOUNTER: MEDICARE and MEDICAID PATIENTS: I certify that this patient is under my care and that I had a face-to-face encounter that meets the physician face-to-face encounter requirements with this patient on this date. The encounter with the patient was in whole or in part for the following MEDICAL CONDITION: (primary reason for Home Healthcare) MEDICAL NECESSITY: I certify, that based on my findings, NURSING services are a medically necessary home health service. HOME BOUND STATUS: I certify that my clinical findings support that this patient is homebound (i.e., Due to illness or injury, pt requires aid of supportive devices such as crutches, cane, wheelchairs, walkers, the use of special transportation or the assistance of another person to leave their place of residence. There is a normal inability to leave the home and doing so requires considerable and taxing effort. Other absences are for medical reasons / religious services and are infrequent or of short duration when for other reasons). If current dressing causes regression in wound condition, may D/C ordered dressing product/s and apply Normal Saline Moist Dressing daily until next Wound Healing Center / Other MD appointment. Notify Wound Healing Center of regression in wound condition at [phone]. Please direct any NON-WOUND related issues/requests for orders to patient's Primary Care Physician Wound #3 Right,Midline,Anterior Lower Leg: Continue Home Health Visits - [facility] Nurse may visit PRN to address patient s wound care needs. FACE TO FACE ENCOUNTER: MEDICARE and MEDICAID PATIENTS: I certify that this patient is under my care and that I had a face-to-face encounter that meets the physician face-to-face encounter requirements with this patient on  this date. The encounter with the patient  was in whole or in part for the following MEDICAL CONDITION: (primary reason for Home Healthcare) MEDICAL NECESSITY: I certify, that based on my findings, NURSING services are a medically necessary home health service. HOME BOUND STATUS: I certify that my clinical findings support that this patient is homebound (i.e., Due to illness or injury, pt requires aid of supportive devices such as crutches, cane, wheelchairs, walkers, the use of special transportation or the assistance of another person to leave their place of residence. There is a normal inability to leave the home and doing so requires considerable and taxing effort. Other absences are for medical reasons / religious services and are infrequent or of short duration when for other reasons). If current dressing causes regression in wound condition, may D/C ordered dressing product/s and apply Normal Saline Moist Dressing daily until next Wound Healing Center / Other MD appointment. Notify Wound Healing Center of regression in wound condition at [phone]. Please direct any NON-WOUND related issues/requests for orders to patient's Primary Care Physician Medications-please add to medication list.: Wound #1 Left,Anterior Lower Leg: Bartnick, Ellyson L. ([id number]) Other: - may take Tylenol for mild pain Wound #2 Right,Anterior Lower Leg: Other: - may take Tylenol for mild pain Wound #3 Right,Midline,Anterior Lower Leg: Other: - may take Tylenol for mild pain We will persist with Santyl ointment locally over the wounds for a few more days and see if we can switch her over to [facility] shortly. Management discussed with her caretakers. Electronic Signature(s) Signed: 04/08/2015 4:27:55 PM By: Evlyn Kanner MD, FACS Previous Signature: 04/07/2015 11:41:41 AM Version By: Evlyn Kanner MD, FACS Entered By: Evlyn Kanner on 04/08/2015 16:27:55 Robeck, [name]  ([id number]) -------------------------------------------------------------------------------- SuperBill Details Patient Name: [name] Date of Service: 04/07/2015 Medical Record Number: [id number] Patient Account Number: [id number] Date of Birth/Sex: March 03, 1913 (81 y.o. Female) Treating RN: Curtis Sites Primary Care Physician: PATIENT, NO Other Clinician: Referring Physician: Marisue Ivan Treating Physician/Extender: Rudene Re in Treatment: 5 Diagnosis Coding ICD-10 Codes Code Description [phone] Non-pressure chronic ulcer of right calf with fat layer exposed L97.222 Non-pressure chronic ulcer of left calf with fat layer exposed I89.0 Lymphedema, not elsewhere classified Facility Procedures CPT4 Code: 13244010 Description: 11042 - DEB SUBQ TISSUE 20 SQ CM/< ICD-10 Description Diagnosis L97.212 Non-pressure chronic ulcer of right calf with fat L97.222 Non-pressure chronic ulcer of left calf with fat l Modifier: layer exposed ayer exposed Quantity: 1 CPT4 Code: [id number] Description: 11045 - DEB SUBQ TISS EA ADDL 20CM ICD-10 Description Diagnosis L97.212 Non-pressure chronic ulcer of right calf with fat L97.222 Non-pressure chronic ulcer of left calf with fat l Modifier: layer exposed ayer exposed Quantity: 1 Physician Procedures CPT4 Code: [id number] Description: 11042 - WC PHYS SUBQ TISS 20 SQ CM ICD-10 Description Diagnosis L97.212 Non-pressure chronic ulcer of right calf with fat l L97.222 Non-pressure chronic ulcer of left calf with fat la Modifier: ayer exposed yer exposed Quantity: 1 CPT4 Code: [id number] [place]one[place], Michon Description: 11045 - WC PHYS SUBQ TISS EA ADDL 20 CM ICD-10 Description Diagnosis L97.212 Non-pressure chronic ulcer of right calf with fat l L97.222 Non-pressure chronic ulcer of left calf with fat la L. ([id number]) Modifier: ayer exposed yer exposed Quantity: 1 Electronic Signature(s) Signed: 04/07/2015 11:42:11 AM By: Evlyn Kanner MD,  FACS Entered By: Evlyn Kanner on 04/07/2015 11:42:11

## 2015-04-08 NOTE — Progress Notes (Signed)
[name] ([id number]) Visit Report for 04/07/2015 Arrival Information Details Patient Name: [name], [name] Date of Service: 04/07/2015 11:00 AM Medical Record Number: [id number] Patient Account Number: [id number] Date of Birth/Sex: March 02, 1913 (81 y.o. Female) Treating RN: Curtis Sites Primary Care Physician: PATIENT, NO Other Clinician: Referring Physician: Marisue Ivan Treating Physician/Extender: Rudene Re in Treatment: 5 Visit Information History Since Last Visit Added or deleted any medications: No Patient Arrived: Wheel Chair Any new allergies or adverse reactions: No Arrival Time: 11:08 Had a fall or experienced change in No activities of daily living that may affect Accompanied By: staff risk of falls: Transfer Assistance: Manual Signs or symptoms of abuse/neglect since last No Patient Identification Verified: Yes visito Secondary Verification Process Yes Hospitalized since last visit: No Completed: Pain Present Now: No Patient Requires Transmission-Based No Precautions: Patient Has Alerts: Yes Electronic Signature(s) Signed: 04/07/2015 5:44:32 PM By: Curtis Sites Entered By: Curtis Sites on 04/07/2015 11:08:40 [name] ([id number]) -------------------------------------------------------------------------------- Encounter Discharge Information Details Patient Name: [name] Date of Service: 04/07/2015 11:00 AM Medical Record Number: [id number] Patient Account Number: [id number] Date of Birth/Sex: May 05, 1913 (81 y.o. Female) Treating RN: Curtis Sites Primary Care Physician: PATIENT, NO Other Clinician: Referring Physician: Marisue Ivan Treating Physician/Extender: Rudene Re in Treatment: 5 Encounter Discharge Information Items Discharge Pain Level: 0 Discharge Condition: Stable Ambulatory Status: Wheelchair Discharge Destination: Home Transportation: Private Auto Accompanied By: staff Schedule  Follow-up Appointment: Yes Medication Reconciliation completed and provided to Patient/Care No [name]: Provided on Clinical Summary of Care: 04/07/2015 Form Type Recipient Paper Patient NP Electronic Signature(s) Signed: 04/07/2015 5:44:32 PM By: Curtis Sites Previous Signature: 04/07/2015 11:53:39 AM Version By: Gwenlyn Perking Entered By: Curtis Sites on 04/07/2015 11:54:49 [name] ([id number]) -------------------------------------------------------------------------------- Lower Extremity Assessment Details Patient Name: [name] Date of Service: 04/07/2015 11:00 AM Medical Record Number: [id number] Patient Account Number: [id number] Date of Birth/Sex: Nov 14, 1912 (81 y.o. Female) Treating RN: Curtis Sites Primary Care Physician: PATIENT, NO Other Clinician: Referring Physician: Marisue Ivan Treating Physician/Extender: Rudene Re in Treatment: 5 Edema Assessment Assessed: [Left: No] [Right: No] Edema: [Left: Yes] [Right: Yes] Calf Left: Right: Point of Measurement: 35 cm From Medial Instep 37.2 cm 38.3 cm Ankle Left: Right: Point of Measurement: 8 cm From Medial Instep 25.3 cm 25.7 cm Vascular Assessment Pulses: Posterior Tibial Dorsalis Pedis Palpable: [Left:Yes] [Right:Yes] Extremity colors, hair growth, and conditions: Extremity Color: [Left:Mottled] [Right:Mottled] Hair Growth on Extremity: [Left:No] [Right:No] Temperature of Extremity: [Left:Warm] [Right:Warm] Capillary Refill: [Left:< 3 seconds] [Right:< 3 seconds] Toe Nail Assessment Left: Right: Thick: Yes Yes Discolored: Yes Yes Deformed: Yes Yes Improper Length and Hygiene: No No Electronic Signature(s) Signed: 04/07/2015 5:44:32 PM By: Curtis Sites Entered By: Curtis Sites on 04/07/2015 11:18:15 [name] ([id number]) -------------------------------------------------------------------------------- Multi Wound Chart Details Patient Name: [name] Date of Service: 04/07/2015 11:00 AM Medical Record Number: [id number] Patient Account Number: [id number] Date of Birth/Sex: 1913-03-03 (81 y.o. Female) Treating RN: Curtis Sites Primary Care Physician: PATIENT, NO Other Clinician: Referring Physician: Marisue Ivan Treating Physician/Extender: Rudene Re in Treatment: 5 Vital Signs Height(in): 64 Pulse(bpm): 68 Weight(lbs): 147 Blood Pressure 138/54 (mmHg): Body Mass Index(BMI): 25 Temperature(F): 97.8 Respiratory Rate 18 (breaths/min): Photos: [1:No Photos] [2:No Photos] [3:No Photos] Wound Location: [1:Left, Anterior Lower Leg] [2:Right, Anterior Lower Leg Right, Midline, Anterior] [3:Lower Leg] Wounding Event: [1:Trauma] [2:Trauma] [3:Blister] Primary Etiology: [1:Skin Tear] [2:Skin Tear] [3:Lymphedema] Date Acquired: [1:01/31/2015] [2:01/31/2015] [3:03/14/2015] Weeks of Treatment: [1:5] [2:5] [3:3] Wound Status: [1:Open] [2:Open] [  3:Open] Measurements L x W x D 6.5x2.6x0.5 [2:3.3x1.6x0.3] [3:2.6x0.6x0.1] (cm) Area (cm) : [1:13.273] [2:4.147] [3:1.225] Volume (cm) : [1:6.637] [2:1.244] [3:0.123] % Reduction in Area: [1:62.90%] [2:17.50%] [3:74.00%] % Reduction in Volume: 38.20% [2:17.50%] [3:73.90%] Classification: [1:Full Thickness Without Exposed Support Structures] [2:Full Thickness Without Exposed Support Structures] [3:Partial Thickness] Periwound Skin Texture: No Abnormalities Noted [2:No Abnormalities Noted] [3:No Abnormalities Noted] Periwound Skin [1:No Abnormalities Noted] [2:No Abnormalities Noted] [3:No Abnormalities Noted] Moisture: Periwound Skin Color: No Abnormalities Noted [2:No Abnormalities Noted] [3:No Abnormalities Noted] Tenderness on [1:No] [2:No] [3:No] Treatment Notes Electronic Signature(s) Signed: 04/07/2015 5:44:32 PM By: [name] ([id number]) Entered By: Curtis Sites on 04/07/2015 11:25:24 [name]  ([id number]) -------------------------------------------------------------------------------- Multi-Disciplinary Care Plan Details Patient Name: [name] Date of Service: 04/07/2015 11:00 AM Medical Record Number: [id number] Patient Account Number: [id number] Date of Birth/Sex: August 08, 1913 (81 y.o. Female) Treating RN: Curtis Sites Primary Care Physician: PATIENT, NO Other Clinician: Referring Physician: Marisue Ivan Treating Physician/Extender: Rudene Re in Treatment: 5 Active Inactive Abuse / Safety / Falls / Self Care Management Nursing Diagnoses: Impaired home maintenance Impaired physical mobility Potential for falls Self care deficit: actual or potential Goals: Patient will remain injury free Date Initiated: 03/03/2015 Goal Status: Active Patient/caregiver will verbalize understanding of skin care regimen Date Initiated: 03/03/2015 Goal Status: Active Patient/caregiver will verbalize/demonstrate measure taken to improve self care Date Initiated: 03/03/2015 Goal Status: Active Patient/caregiver will verbalize/demonstrate measures taken to improve the patient's personal safety Date Initiated: 03/03/2015 Goal Status: Active Patient/caregiver will verbalize/demonstrate measures taken to prevent injury and/or falls Date Initiated: 03/03/2015 Goal Status: Active Patient/caregiver will verbalize/demonstrate understanding of what to do in case of emergency Date Initiated: 03/03/2015 Goal Status: Active Interventions: Assess: immobility, friction, shearing, incontinence upon admission and as needed Assess impairment of mobility on admission and as needed per policy Provide education on basic hygiene Provide education on personal and home safety Provide education on safe transfers CRISTINA, CENICEROS ([id number]) Treatment Activities: Education provided on Basic Hygiene : 03/10/2015 Notes: Orientation to the Wound Care Program Nursing Diagnoses: Knowledge deficit  related to the wound healing center program Goals: Patient/caregiver will verbalize understanding of the Wound Healing Center Program Date Initiated: 03/03/2015 Goal Status: Active Interventions: Provide education on orientation to the wound center Notes: Venous Leg Ulcer Nursing Diagnoses: Knowledge deficit related to disease process and management Potential for venous Insuffiency (use before diagnosis confirmed) Goals: Patient will maintain optimal edema control Date Initiated: 03/03/2015 Goal Status: Active Patient/caregiver will verbalize understanding of disease process and disease management Date Initiated: 03/03/2015 Goal Status: Active Verify adequate tissue perfusion prior to therapeutic compression application Date Initiated: 03/03/2015 Goal Status: Active Interventions: Assess peripheral edema status every visit. Compression as ordered Provide education on venous insufficiency Treatment Activities: Test ordered outside of clinic : 04/07/2015 Notes: MELEK, POWNALL ([id number]) Wound/Skin Impairment Nursing Diagnoses: Impaired tissue integrity Knowledge deficit related to ulceration/compromised skin integrity Goals: Patient/caregiver will verbalize understanding of skin care regimen Date Initiated: 03/03/2015 Goal Status: Active Ulcer/skin breakdown will have a volume reduction of 30% by week 4 Date Initiated: 03/03/2015 Goal Status: Active Ulcer/skin breakdown will have a volume reduction of 50% by week 8 Date Initiated: 03/03/2015 Goal Status: Active Ulcer/skin breakdown will have a volume reduction of 80% by week 12 Date Initiated: 03/03/2015 Goal Status: Active Ulcer/skin breakdown will heal within 14 weeks Date Initiated: 03/03/2015 Goal Status: Active Interventions: Assess patient/caregiver ability to perform ulcer/skin care regimen upon admission and as needed Assess ulceration(s) every visit  Provide education on ulcer and skin care Notes: Electronic  Signature(s) Signed: 04/07/2015 5:44:32 PM By: Curtis Sites Entered By: Curtis Sites on 04/07/2015 11:25:11 Mano, Lelon Yu ([id number]) -------------------------------------------------------------------------------- Patient/Caregiver Education Details Patient Name: [name] Date of Service: 04/07/2015 11:00 AM Medical Record Number: [id number] Patient Account Number: [id number] Date of Birth/Gender: 05/29/1913 (81 y.o. Female) Treating RN: Curtis Sites Primary Care Physician: PATIENT, NO Other Clinician: Referring Physician: Marisue Ivan Treating Physician/Extender: Rudene Re in Treatment: 5 Education Assessment Education Provided To: Patient Education Topics Provided Wound/Skin Impairment: Handouts: Other: wound healing Methods: Explain/Verbal Responses: State content correctly Electronic Signature(s) Signed: 04/07/2015 5:44:32 PM By: Curtis Sites Entered By: Curtis Sites on 04/07/2015 11:55:05 Keizer, Lelon Yu ([id number]) -------------------------------------------------------------------------------- Wound Assessment Details Patient Name: [name] Date of Service: 04/07/2015 11:00 AM Medical Record Number: [id number] Patient Account Number: [id number] Date of Birth/Sex: 1913-05-12 (81 y.o. Female) Treating RN: Curtis Sites Primary Care Physician: PATIENT, NO Other Clinician: Referring Physician: Marisue Ivan Treating Physician/Extender: Rudene Re in Treatment: 5 Wound Status Wound Number: 1 Primary Etiology: Skin Tear Wound Location: Left Lower Leg - Anterior Wound Status: Open Wounding Event: Trauma Comorbid History: Hypertension, Osteoarthritis Date Acquired: 01/31/2015 Weeks Of Treatment: 5 Clustered Wound: No Photos Photo Uploaded By: Curtis Sites on 04/07/2015 17:37:41 Wound Measurements Length: (cm) 6.5 % Reduction in Width: (cm) 2.6 % Reduction in Depth: (cm) 0.5 Epithelializati Area: (cm)  13.273 Tunneling: Volume: (cm) 6.637 Undermining: Area: 62.9% Volume: 38.2% on: Small (1-33%) No No Wound Description Full Thickness Without Exposed Classification: Support Structures Wound Margin: Distinct, outline attached Exudate Medium Amount: Exudate Type: Serosanguineous Exudate Color: red, brown Foul Odor After Cleansing: No Wound Bed Granulation Amount: Medium (34-66%) Exposed Structure Granulation Quality: Pink, Pale Fascia Exposed: No Necrotic Amount: Medium (34-66%) Fat Layer Exposed: No Jagodzinski, Niana L. ([id number]) Necrotic Quality: Eschar, Adherent Slough Tendon Exposed: No Muscle Exposed: No Joint Exposed: No Bone Exposed: No Limited to Skin Breakdown Periwound Skin Texture Texture Color No Abnormalities Noted: No No Abnormalities Noted: No Callus: No Atrophie Blanche: No Crepitus: No Cyanosis: No Excoriation: No Ecchymosis: No Fluctuance: No Erythema: No Friable: No Hemosiderin Staining: No Induration: No Mottled: No Localized Edema: Yes Pallor: No Rash: No Rubor: No Scarring: No Temperature / Pain Moisture Temperature: No Abnormality No Abnormalities Noted: No Tenderness on Palpation: Yes Dry / Scaly: No Maceration: No Moist: Yes Wound Preparation Ulcer Cleansing: Rinsed/Irrigated with Saline Topical Anesthetic Applied: Other: Lidocaine 4%, Treatment Notes Wound #1 (Left, Anterior Lower Leg) 1. Cleansed with: Clean wound with Normal Saline 2. Anesthetic Topical Lidocaine 4% cream to wound bed prior to debridement 4. Dressing Applied: Santyl Ointment 5. Secondary Dressing Applied Non-Adherent pad ABD and Kerlix/Conform 7. Secured with Tape Tubigrip Electronic Signature(s) Signed: 04/07/2015 5:44:32 PM By: Curtis Sites Entered By: Curtis Sites on 04/07/2015 11:26:01 Som, Lelon Yu ([id number]) Fasnacht, Lelon Yu ([id number]) -------------------------------------------------------------------------------- Wound  Assessment Details Patient Name: [name] Date of Service: 04/07/2015 11:00 AM Medical Record Number: [id number] Patient Account Number: [id number] Date of Birth/Sex: 08-07-1913 (81 y.o. Female) Treating RN: Curtis Sites Primary Care Physician: PATIENT, NO Other Clinician: Referring Physician: Marisue Ivan Treating Physician/Extender: Rudene Re in Treatment: 5 Wound Status Wound Number: 2 Primary Etiology: Skin Tear Wound Location: Right Lower Leg - Anterior Wound Status: Open Wounding Event: Trauma Comorbid History: Hypertension, Osteoarthritis Date Acquired: 01/31/2015 Weeks Of Treatment: 5 Clustered Wound: No Photos Photo Uploaded By: Curtis Sites on 04/07/2015 17:37:42 Wound Measurements Length: (cm) 3.3 % Reduction in Width: (cm)  1.6 % Reduction in Depth: (cm) 0.3 Epithelializati Area: (cm) 4.147 Tunneling: Volume: (cm) 1.244 Undermining: Area: 17.5% Volume: 17.5% on: Small (1-33%) No No Wound Description Full Thickness Without Exposed Classification: Support Structures Wound Margin: Distinct, outline attached Exudate Medium Amount: Exudate Type: Serosanguineous Exudate Color: red, brown Foul Odor After Cleansing: No Wound Bed Granulation Amount: Medium (34-66%) Exposed Structure Granulation Quality: Red Fascia Exposed: No Necrotic Amount: Medium (34-66%) Fat Layer Exposed: No Scheiber, Lennyx L. ([id number]) Necrotic Quality: Adherent Slough Tendon Exposed: No Muscle Exposed: No Joint Exposed: No Bone Exposed: No Limited to Skin Breakdown Periwound Skin Texture Texture Color No Abnormalities Noted: No No Abnormalities Noted: No Callus: No Atrophie Blanche: No Crepitus: No Cyanosis: No Excoriation: No Ecchymosis: No Fluctuance: No Erythema: No Friable: No Hemosiderin Staining: No Induration: No Mottled: No Localized Edema: Yes Pallor: No Rash: No Rubor: No Scarring: No Temperature / Pain Moisture  Temperature: No Abnormality No Abnormalities Noted: No Tenderness on Palpation: Yes Dry / Scaly: No Maceration: No Moist: Yes Wound Preparation Ulcer Cleansing: Rinsed/Irrigated with Saline Topical Anesthetic Applied: Other: lidocaine 4% ceam, Treatment Notes Wound #2 (Right, Anterior Lower Leg) 1. Cleansed with: Clean wound with Normal Saline 2. Anesthetic Topical Lidocaine 4% cream to wound bed prior to debridement 4. Dressing Applied: Santyl Ointment 5. Secondary Dressing Applied Non-Adherent pad ABD and Kerlix/Conform 7. Secured with Tape Tubigrip Electronic Signature(s) Signed: 04/07/2015 5:44:32 PM By: Curtis Sites Entered By: Curtis Sites on 04/07/2015 11:26:17 Ferdinand, Lelon Yu ([id number]) Shanley, Lelon Yu ([id number]) -------------------------------------------------------------------------------- Wound Assessment Details Patient Name: [name] Date of Service: 04/07/2015 11:00 AM Medical Record Number: [id number] Patient Account Number: [id number] Date of Birth/Sex: 02-06-1913 (81 y.o. Female) Treating RN: Curtis Sites Primary Care Physician: PATIENT, NO Other Clinician: Referring Physician: Marisue Ivan Treating Physician/Extender: Rudene Re in Treatment: 5 Wound Status Wound Number: 3 Primary Etiology: Lymphedema Wound Location: Right Lower Leg - Midline, Wound Status: Open Anterior Comorbid History: Hypertension, Osteoarthritis Wounding Event: Blister Date Acquired: 03/14/2015 Weeks Of Treatment: 3 Clustered Wound: No Photos Photo Uploaded By: Curtis Sites on 04/07/2015 17:37:58 Wound Measurements Length: (cm) 2.6 % Reduction in Width: (cm) 0.6 % Reduction in Depth: (cm) 0.1 Epithelializati Area: (cm) 1.225 Tunneling: Volume: (cm) 0.123 Undermining: Area: 74% Volume: 73.9% on: Large (67-100%) No No Wound Description Classification: Partial Thickness Wound Margin: Indistinct, nonvisible Exudate Amount:  Small Exudate Type: Serosanguineous Exudate Color: red, brown Foul Odor After Cleansing: No Wound Bed Granulation Amount: Large (67-100%) Exposed Structure Granulation Quality: Pink, Pale Fascia Exposed: No Necrotic Amount: None Present (0%) Fat Layer Exposed: No Pizzolato, Nou L. ([id number]) Tendon Exposed: No Muscle Exposed: No Joint Exposed: No Bone Exposed: No Limited to Skin Breakdown Periwound Skin Texture Texture Color No Abnormalities Noted: No No Abnormalities Noted: No Callus: No Atrophie Blanche: No Crepitus: No Cyanosis: No Excoriation: No Ecchymosis: No Fluctuance: No Erythema: No Friable: No Hemosiderin Staining: No Induration: No Mottled: No Localized Edema: Yes Pallor: No Rash: No Rubor: No Scarring: No Temperature / Pain Moisture Temperature: No Abnormality No Abnormalities Noted: No Tenderness on Palpation: Yes Dry / Scaly: No Maceration: No Moist: Yes Wound Preparation Ulcer Cleansing: Rinsed/Irrigated with Saline Topical Anesthetic Applied: None Treatment Notes Wound #3 (Right, Midline, Anterior Lower Leg) 1. Cleansed with: Clean wound with Normal Saline 4. Dressing Applied: Xeroform 5. Secondary Dressing Applied Kerlix/Conform Non-Adherent pad 7. Secured with Tape Tubigrip Electronic Signature(s) Signed: 04/07/2015 5:44:32 PM By: Curtis Sites Entered By: Curtis Sites on 04/07/2015 11:26:32 Brazell, Winda L. ([id number]) -------------------------------------------------------------------------------- Vitals  Details Patient Name: NAMIAH, DUNNAVANT Date of Service: 04/07/2015 11:00 AM Medical Record Number: [id number] Patient Account Number: [id number] Date of Birth/Sex: September 10, 1912 (81 y.o. Female) Treating RN: Curtis Sites Primary Care Physician: PATIENT, NO Other Clinician: Referring Physician: Marisue Ivan Treating Physician/Extender: Rudene Re in Treatment: 5 Vital Signs Time Taken: 11:06 Temperature  (F): 97.8 Height (in): 64 Pulse (bpm): 68 Weight (lbs): 147 Respiratory Rate (breaths/min): 18 Body Mass Index (BMI): 25.2 Blood Pressure (mmHg): 138/54 Reference Range: 80 - 120 mg / dl Electronic Signature(s) Signed: 04/07/2015 5:44:32 PM By: Curtis Sites Entered By: Curtis Sites on 04/07/2015 11:09:35

## 2015-04-14 ENCOUNTER — Encounter: Payer: Medicare Other | Admitting: Surgery

## 2015-04-14 DIAGNOSIS — L97212 Non-pressure chronic ulcer of right calf with fat layer exposed: Secondary | ICD-10-CM | POA: Diagnosis not present

## 2015-04-14 NOTE — Progress Notes (Signed)
[name], [name] ([id number]) Visit Report for 04/14/2015 Arrival Information Details Patient Name: Leah, Yu Date of Service: 04/14/2015 11:30 AM Medical Record Number: [id number] Patient Account Number: [id number] Date of Birth/Sex: 17-Sep-1912 (81 y.o. Female) Treating RN: [name], RN, BSN, [employer] Primary Care Physician: PATIENT, NO Other Clinician: Referring Physician: Marisue Ivan Treating Physician/Extender: Rudene Re in Treatment: 6 Visit Information History Since Last Visit Any new allergies or adverse reactions: No Patient Arrived: Wheel Chair Had a fall or experienced change in No activities of daily living that may affect Arrival Time: 11:39 risk of falls: Accompanied By: caregiver Signs or symptoms of abuse/neglect since last No Transfer Assistance: None visito Patient Identification Verified: Yes Hospitalized since last visit: No Secondary Verification Process Yes Has Dressing in Place as Prescribed: Yes Completed: Pain Present Now: No Patient Requires Transmission-Based No Precautions: Patient Has Alerts: Yes Electronic Signature(s) Signed: 04/14/2015 11:40:08 AM By: Elpidio Eric BSN, RN Entered By: Elpidio Eric on 04/14/2015 11:40:07 [name], [name] ([id number]) -------------------------------------------------------------------------------- Encounter Discharge Information Details Patient Name: [name] Date of Service: 04/14/2015 11:30 AM Medical Record Number: [id number] Patient Account Number: [id number] Date of Birth/Sex: 1912/09/15 (81 y.o. Female) Treating RN: Clover Mealy, RN, BSN, [place] [employer] Primary Care Physician: PATIENT, NO Other Clinician: Referring Physician: Marisue Ivan Treating Physician/Extender: Rudene Re in Treatment: 6 Encounter Discharge Information Items Discharge Pain Level: 0 Discharge Condition: Stable Ambulatory Status: Wheelchair Discharge Destination: Nursing Home Transportation: Other Accompanied  By: caregiver Schedule Follow-up Appointment: No Medication Reconciliation completed and provided to Patient/Care No [name]: Provided on Clinical Summary of Care: 04/14/2015 Form Type Recipient Paper Patient NP Electronic Signature(s) Signed: 04/14/2015 12:22:31 PM By: Gwenlyn Perking Previous Signature: 04/14/2015 12:19:03 PM Version By: Elpidio Eric BSN, RN Entered By: Gwenlyn Perking on 04/14/2015 12:22:31 [name], [name] ([id number]) -------------------------------------------------------------------------------- Lower Extremity Assessment Details Patient Name: [name] Date of Service: 04/14/2015 11:30 AM Medical Record Number: [id number] Patient Account Number: [id number] Date of Birth/Sex: 09-06-1912 (81 y.o. Female) Treating RN: [name], RN, BSN, Psychologist, clinical Primary Care Physician: PATIENT, NO Other Clinician: Referring Physician: Marisue Ivan Treating Physician/Extender: Rudene Re in Treatment: 6 Edema Assessment Assessed: [Left: No] [Right: No] E[Left: dema] [Right: :] Calf Left: Right: Point of Measurement: 35 cm From Medial Instep 37.6 cm 38.7 cm Ankle Left: Right: Point of Measurement: 8 cm From Medial Instep 25.4 cm 25.7 cm Vascular Assessment Pulses: Posterior Tibial Dorsalis Pedis Palpable: [Left:Yes] [Right:Yes] Extremity colors, hair growth, and conditions: Extremity Color: [Left:Mottled] [Right:Mottled] Temperature of Extremity: [Left:Warm] [Right:Warm] Capillary Refill: [Left:< 3 seconds] [Right:< 3 seconds] Toe Nail Assessment Left: Right: Thick: Yes Yes Discolored: Yes Yes Deformed: No No Improper Length and Hygiene: Yes Yes Electronic Signature(s) Signed: 04/14/2015 11:42:08 AM By: Elpidio Eric BSN, RN Entered By: Elpidio Eric on 04/14/2015 11:42:08 [name], [name] ([id number]) -------------------------------------------------------------------------------- Multi Wound Chart Details Patient Name: [name] Date of Service:  04/14/2015 11:30 AM Medical Record Number: [id number] Patient Account Number: [id number] Date of Birth/Sex: 1913-05-17 (81 y.o. Female) Treating RN: Clover Mealy, RN, BSN, [employer] Primary Care Physician: PATIENT, NO Other Clinician: Referring Physician: Marisue Ivan Treating Physician/Extender: Rudene Re in Treatment: 6 Vital Signs Height(in): 64 Pulse(bpm): 70 Weight(lbs): 147 Blood Pressure 110/68 (mmHg): Body Mass Index(BMI): 25 Temperature(F): 97.8 Respiratory Rate 18 (breaths/min): Photos: [1:No Photos] [2:No Photos] [3:No Photos] Wound Location: [1:Left Lower Leg - Anterior Right Lower Leg - Anterior Right Lower Leg - Midline,] [3:Anterior] Wounding Event: [1:Trauma] [2:Trauma] [3:Blister] Primary Etiology: [1:Skin Tear] [2:Skin Tear] [3:Lymphedema] Comorbid History: [1:Hypertension, Osteoarthritis] [  2:Hypertension, Osteoarthritis] [3:Hypertension, Osteoarthritis] Date Acquired: [1:01/31/2015] [2:01/31/2015] [3:03/14/2015] Weeks of Treatment: [1:6] [2:6] [3:4] Wound Status: [1:Open] [2:Open] [3:Open] Measurements L x W x D 7.2x4x0.5 [2:1x1.4x0.1] [3:3.2x1.8x0.2] (cm) Area (cm) : [1:22.619] [2:1.1] [3:4.524] Volume (cm) : [1:11.31] [2:0.11] [3:0.905] % Reduction in Area: [1:36.80%] [2:78.10%] [3:4.00%] % Reduction in Volume: -5.30% [2:92.70%] [3:-92.10%] Classification: [1:Full Thickness Without Exposed Support Structures] [2:Full Thickness Without Exposed Support Structures] [3:Partial Thickness] Exudate Amount: [1:Medium] [2:Medium] [3:Small] Exudate Type: [1:Serosanguineous] [2:Serosanguineous] [3:Serosanguineous] Exudate Color: [1:red, brown] [2:red, brown] [3:red, brown] Wound Margin: [1:Distinct, outline attached Distinct, outline attached Indistinct, nonvisible] Granulation Amount: [1:Medium (34-66%)] [2:Medium (34-66%)] [3:Medium (34-66%)] Granulation Quality: [1:Pink, Pale] [2:Red] [3:Pink, Pale] Necrotic Amount: [1:Medium (34-66%)] [2:Medium (34-66%)]  [3:Small (1-33%)] Necrotic Tissue: [1:Eschar, Adherent Slough Adherent Slough] [3:Adherent Slough] Exposed Structures: [1:Fascia: No Fat: No Tendon: No] [2:Fascia: No Fat: No Tendon: No] [3:Fascia: No Fat: No Tendon: No] Muscle: No Muscle: No Muscle: No Joint: No Joint: No Joint: No Bone: No Bone: No Bone: No Limited to Skin Limited to Skin Limited to Skin Breakdown Breakdown Breakdown Epithelialization: Small (1-33%) Small (1-33%) Large (67-100%) Periwound Skin Texture: Edema: Yes Edema: Yes Edema: Yes Excoriation: No Excoriation: No Excoriation: No Induration: No Induration: No Induration: No Callus: No Callus: No Callus: No Crepitus: No Crepitus: No Crepitus: No Fluctuance: No Fluctuance: No Fluctuance: No Friable: No Friable: No Friable: No Rash: No Rash: No Rash: No Scarring: No Scarring: No Scarring: No Periwound Skin Moist: Yes Moist: Yes Moist: Yes Moisture: Maceration: No Maceration: No Maceration: No Dry/Scaly: No Dry/Scaly: No Dry/Scaly: No Periwound Skin Color: Atrophie Blanche: No Atrophie Blanche: No Atrophie Blanche: No Cyanosis: No Cyanosis: No Cyanosis: No Ecchymosis: No Ecchymosis: No Ecchymosis: No Erythema: No Erythema: No Erythema: No Hemosiderin Staining: No Hemosiderin Staining: No Hemosiderin Staining: No Mottled: No Mottled: No Mottled: No Pallor: No Pallor: No Pallor: No Rubor: No Rubor: No Rubor: No Temperature: No Abnormality No Abnormality No Abnormality Tenderness on Yes Yes Yes Palpation: Wound Preparation: Ulcer Cleansing: Ulcer Cleansing: Ulcer Cleansing: Rinsed/Irrigated with Rinsed/Irrigated with Rinsed/Irrigated with Saline Saline Saline Topical Anesthetic Topical Anesthetic Topical Anesthetic Applied: Other: Lidocaine Applied: Other: lidocaine Applied: None 4% 4% ceam Treatment Notes Electronic Signature(s) Signed: 04/14/2015 11:57:57 AM By: Elpidio Eric BSN, RN Entered By: Elpidio Eric on  04/14/2015 11:57:57 Sosa, [name] ([id number]) -------------------------------------------------------------------------------- Multi-Disciplinary Care Plan Details Patient Name: [name] Date of Service: 04/14/2015 11:30 AM Medical Record Number: [id number] Patient Account Number: [id number] Date of Birth/Sex: 02-27-1913 (81 y.o. Female) Treating RN: [name], RN, BSN, [employer] Primary Care Physician: PATIENT, NO Other Clinician: Referring Physician: Marisue Ivan Treating Physician/Extender: Rudene Re in Treatment: 6 Active Inactive Abuse / Safety / Falls / Self Care Management Nursing Diagnoses: Impaired home maintenance Impaired physical mobility Potential for falls Self care deficit: actual or potential Goals: Patient will remain injury free Date Initiated: 03/03/2015 Goal Status: Active Patient/caregiver will verbalize understanding of skin care regimen Date Initiated: 03/03/2015 Goal Status: Active Patient/caregiver will verbalize/demonstrate measure taken to improve self care Date Initiated: 03/03/2015 Goal Status: Active Patient/caregiver will verbalize/demonstrate measures taken to improve the patient's personal safety Date Initiated: 03/03/2015 Goal Status: Active Patient/caregiver will verbalize/demonstrate measures taken to prevent injury and/or falls Date Initiated: 03/03/2015 Goal Status: Active Patient/caregiver will verbalize/demonstrate understanding of what to do in case of emergency Date Initiated: 03/03/2015 Goal Status: Active Interventions: Assess: immobility, friction, shearing, incontinence upon admission and as needed Assess impairment of mobility on admission and as needed per policy Provide education on basic hygiene Provide  education on personal and home safety Provide education on safe transfers [name], [name] ([id number]) Treatment Activities: Education provided on Basic Hygiene : 03/10/2015 Notes: Orientation to the Wound Care  Program Nursing Diagnoses: Knowledge deficit related to the wound healing center program Goals: Patient/caregiver will verbalize understanding of the Wound Healing Center Program Date Initiated: 03/03/2015 Goal Status: Active Interventions: Provide education on orientation to the wound center Notes: Venous Leg Ulcer Nursing Diagnoses: Knowledge deficit related to disease process and management Potential for venous Insuffiency (use before diagnosis confirmed) Goals: Patient will maintain optimal edema control Date Initiated: 03/03/2015 Goal Status: Active Patient/caregiver will verbalize understanding of disease process and disease management Date Initiated: 03/03/2015 Goal Status: Active Verify adequate tissue perfusion prior to therapeutic compression application Date Initiated: 03/03/2015 Goal Status: Active Interventions: Assess peripheral edema status every visit. Compression as ordered Provide education on venous insufficiency Treatment Activities: Test ordered outside of clinic : 04/14/2015 Notes: [name], [name] ([id number]) Wound/Skin Impairment Nursing Diagnoses: Impaired tissue integrity Knowledge deficit related to ulceration/compromised skin integrity Goals: Patient/caregiver will verbalize understanding of skin care regimen Date Initiated: 03/03/2015 Goal Status: Active Ulcer/skin breakdown will have a volume reduction of 30% by week 4 Date Initiated: 03/03/2015 Goal Status: Active Ulcer/skin breakdown will have a volume reduction of 50% by week 8 Date Initiated: 03/03/2015 Goal Status: Active Ulcer/skin breakdown will have a volume reduction of 80% by week 12 Date Initiated: 03/03/2015 Goal Status: Active Ulcer/skin breakdown will heal within 14 weeks Date Initiated: 03/03/2015 Goal Status: Active Interventions: Assess patient/caregiver ability to perform ulcer/skin care regimen upon admission and as needed Assess ulceration(s) every visit Provide education on ulcer  and skin care Notes: Electronic Signature(s) Signed: 04/14/2015 11:57:24 AM By: Elpidio Eric BSN, RN Entered By: Elpidio Eric on 04/14/2015 11:57:24 Bagent, [name] ([id number]) -------------------------------------------------------------------------------- Pain Assessment Details Patient Name: [name] Date of Service: 04/14/2015 11:30 AM Medical Record Number: [id number] Patient Account Number: [id number] Date of Birth/Sex: 08/14/1913 (81 y.o. Female) Treating RN: Clover Mealy, RN, BSN, [place] [employer] Primary Care Physician: PATIENT, NO Other Clinician: Referring Physician: Marisue Ivan Treating Physician/Extender: Rudene Re in Treatment: 6 Active Problems Location of Pain Severity and Description of Pain Patient Has Paino No Site Locations Pain Management and Medication Current Pain Management: Electronic Signature(s) Signed: 04/14/2015 11:40:16 AM By: Elpidio Eric BSN, RN Entered By: Elpidio Eric on 04/14/2015 11:40:16 Mcroberts, [name] ([id number]) -------------------------------------------------------------------------------- Patient/Caregiver Education Details Patient Name: [name] Date of Service: 04/14/2015 11:30 AM Medical Record Number: [id number] Patient Account Number: [id number] Date of Birth/Gender: 08/03/1913 (81 y.o. Female) Treating RN: Clover Mealy, RN, BSN, [employer] Primary Care Physician: PATIENT, NO Other Clinician: Referring Physician: Marisue Ivan Treating Physician/Extender: Rudene Re in Treatment: 6 Education Assessment Education Provided To: Patient and Caregiver Education Topics Provided Basic Hygiene: Methods: Explain/Verbal Responses: State content correctly Safety: Methods: Explain/Verbal Responses: State content correctly Venous: Methods: Explain/Verbal Responses: See progress note Welcome To The [facility]: Handouts: Welcome To The [facility] Methods: Explain/Verbal Responses: State content  correctly Wound/Skin Impairment: Methods: Explain/Verbal Responses: State content correctly Electronic Signature(s) Signed: 04/14/2015 12:20:34 PM By: Elpidio Eric BSN, RN Entered By: Elpidio Eric on 04/14/2015 12:20:34 Kamer, [name] ([id number]) -------------------------------------------------------------------------------- Wound Assessment Details Patient Name: [name] Date of Service: 04/14/2015 11:30 AM Medical Record Number: [id number] Patient Account Number: [id number] Date of Birth/Sex: [birth year] (81 y.o. Female) Treating RN: [name], RN, BSN, [employer] Primary Care Physician: PATIENT, NO Other Clinician: Referring Physician: Marisue Ivan Treating Physician/Extender: Rudene Re in  Treatment: 6 Wound Status Wound Number: 1 Primary Etiology: Skin Tear Wound Location: Left Lower Leg - Anterior Wound Status: Open Wounding Event: Trauma Comorbid History: Hypertension, Osteoarthritis Date Acquired: 01/31/2015 Weeks Of Treatment: 6 Clustered Wound: No Photos Photo Uploaded By: Elpidio Eric on 04/14/2015 16:19:42 Wound Measurements Length: (cm) 7.2 Width: (cm) 4 Depth: (cm) 0.5 Area: (cm) 22.619 Volume: (cm) 11.31 % Reduction in Area: 36.8% % Reduction in Volume: -5.3% Epithelialization: Small (1-33%) Tunneling: No Undermining: No Wound Description Full Thickness Without Exposed Classification: Support Structures Wound Margin: Distinct, outline attached Exudate Medium Amount: Exudate Type: Serosanguineous Exudate Color: red, brown Foul Odor After Cleansing: No Wound Bed Granulation Amount: Medium (34-66%) Exposed Structure Granulation Quality: Pink, Pale Fascia Exposed: No Necrotic Amount: Medium (34-66%) Fat Layer Exposed: No [name], Leah L. ([id number]) Necrotic Quality: Eschar, Adherent Slough Tendon Exposed: No Muscle Exposed: No Joint Exposed: No Bone Exposed: No Limited to Skin Breakdown Periwound Skin Texture Texture Color No  Abnormalities Noted: No No Abnormalities Noted: No Callus: No Atrophie Blanche: No Crepitus: No Cyanosis: No Excoriation: No Ecchymosis: No Fluctuance: No Erythema: No Friable: No Hemosiderin Staining: No Induration: No Mottled: No Localized Edema: Yes Pallor: No Rash: No Rubor: No Scarring: No Temperature / Pain Moisture Temperature: No Abnormality No Abnormalities Noted: No Tenderness on Palpation: Yes Dry / Scaly: No Maceration: No Moist: Yes Wound Preparation Ulcer Cleansing: Rinsed/Irrigated with Saline Topical Anesthetic Applied: Other: Lidocaine 4%, Treatment Notes Wound #1 (Left, Anterior Lower Leg) 1. Cleansed with: Clean wound with Normal Saline 4. Dressing Applied: Aquacel Ag 5. Secondary Dressing Applied Guaze, ABD and kerlix/Conform 7. Secured with Tape Tubigrip Electronic Signature(s) Signed: 04/14/2015 11:55:32 AM By: Elpidio Eric BSN, RN Entered By: Elpidio Eric on 04/14/2015 11:55:32 Colt, [name] ([id number]) -------------------------------------------------------------------------------- Wound Assessment Details Patient Name: [name] Date of Service: 04/14/2015 11:30 AM Medical Record Number: [id number] Patient Account Number: [id number] Date of Birth/Sex: 24-Jul-1913 (81 y.o. Female) Treating RN: [name], RN, BSN, Psychologist, clinical Primary Care Physician: PATIENT, NO Other Clinician: Referring Physician: Marisue Ivan Treating Physician/Extender: Rudene Re in Treatment: 6 Wound Status Wound Number: 2 Primary Etiology: Skin Tear Wound Location: Right Lower Leg - Anterior Wound Status: Open Wounding Event: Trauma Comorbid History: Hypertension, Osteoarthritis Date Acquired: 01/31/2015 Weeks Of Treatment: 6 Clustered Wound: No Photos Photo Uploaded By: Elpidio Eric on 04/14/2015 16:19:43 Wound Measurements Length: (cm) 1 Width: (cm) 1.4 Depth: (cm) 0.1 Area: (cm) 1.1 Volume: (cm) 0.11 % Reduction in Area: 78.1% %  Reduction in Volume: 92.7% Epithelialization: Small (1-33%) Tunneling: No Undermining: No Wound Description Full Thickness Without Exposed Classification: Support Structures Wound Margin: Distinct, outline attached Exudate Medium Amount: Exudate Type: Serosanguineous Exudate Color: red, brown Foul Odor After Cleansing: No Wound Bed Granulation Amount: Medium (34-66%) Exposed Structure Granulation Quality: Red Fascia Exposed: No Necrotic Amount: Medium (34-66%) Fat Layer Exposed: No Waymire, Leah L. ([id number]) Necrotic Quality: Adherent Slough Tendon Exposed: No Muscle Exposed: No Joint Exposed: No Bone Exposed: No Limited to Skin Breakdown Periwound Skin Texture Texture Color No Abnormalities Noted: No No Abnormalities Noted: No Callus: No Atrophie Blanche: No Crepitus: No Cyanosis: No Excoriation: No Ecchymosis: No Fluctuance: No Erythema: No Friable: No Hemosiderin Staining: No Induration: No Mottled: No Localized Edema: Yes Pallor: No Rash: No Rubor: No Scarring: No Temperature / Pain Moisture Temperature: No Abnormality No Abnormalities Noted: No Tenderness on Palpation: Yes Dry / Scaly: No Maceration: No Moist: Yes Wound Preparation Ulcer Cleansing: Rinsed/Irrigated with Saline Topical Anesthetic Applied: Other: lidocaine 4% ceam, Treatment Notes  Wound #2 (Right, Anterior Lower Leg) 1. Cleansed with: Clean wound with Normal Saline 4. Dressing Applied: Aquacel Ag 5. Secondary Dressing Applied Guaze, ABD and kerlix/Conform 7. Secured with Tape Tubigrip Electronic Signature(s) Signed: 04/14/2015 11:55:50 AM By: Elpidio Eric BSN, RN Entered By: Elpidio Eric on 04/14/2015 11:55:50 Reinhold, [name] ([id number]) -------------------------------------------------------------------------------- Wound Assessment Details Patient Name: [name] Date of Service: 04/14/2015 11:30 AM Medical Record Number: [id number] Patient Account Number:  [id number] Date of Birth/Sex: 24-Oct-1912 (81 y.o. Female) Treating RN: [name], RN, BSN, Psychologist, clinical Primary Care Physician: PATIENT, NO Other Clinician: Referring Physician: Marisue Ivan Treating Physician/Extender: Rudene Re in Treatment: 6 Wound Status Wound Number: 3 Primary Etiology: Lymphedema Wound Location: Right Lower Leg - Midline, Wound Status: Open Anterior Comorbid History: Hypertension, Osteoarthritis Wounding Event: Blister Date Acquired: 03/14/2015 Weeks Of Treatment: 4 Clustered Wound: No Photos Photo Uploaded By: Elpidio Eric on 04/14/2015 16:20:51 Wound Measurements Length: (cm) 3.2 Width: (cm) 1.8 Depth: (cm) 0.2 Area: (cm) 4.524 Volume: (cm) 0.905 % Reduction in Area: 4% % Reduction in Volume: -92.1% Epithelialization: Large (67-100%) Tunneling: No Undermining: No Wound Description Classification: Partial Thickness Wound Margin: Indistinct, nonvisible Exudate Amount: Small Exudate Type: Serosanguineous Exudate Color: red, brown Foul Odor After Cleansing: No Wound Bed Granulation Amount: Medium (34-66%) Exposed Structure Granulation Quality: Pink, Pale Fascia Exposed: No Necrotic Amount: Small (1-33%) Fat Layer Exposed: No Spisak, Leah L. ([id number]) Necrotic Quality: Adherent Slough Tendon Exposed: No Muscle Exposed: No Joint Exposed: No Bone Exposed: No Limited to Skin Breakdown Periwound Skin Texture Texture Color No Abnormalities Noted: No No Abnormalities Noted: No Callus: No Atrophie Blanche: No Crepitus: No Cyanosis: No Excoriation: No Ecchymosis: No Fluctuance: No Erythema: No Friable: No Hemosiderin Staining: No Induration: No Mottled: No Localized Edema: Yes Pallor: No Rash: No Rubor: No Scarring: No Temperature / Pain Moisture Temperature: No Abnormality No Abnormalities Noted: No Tenderness on Palpation: Yes Dry / Scaly: No Maceration: No Moist: Yes Wound Preparation Ulcer Cleansing:  Rinsed/Irrigated with Saline Topical Anesthetic Applied: None Treatment Notes Wound #3 (Right, Midline, Anterior Lower Leg) 1. Cleansed with: Clean wound with Normal Saline 4. Dressing Applied: Aquacel Ag 5. Secondary Dressing Applied Guaze, ABD and kerlix/Conform 7. Secured with Tape Tubigrip Electronic Signature(s) Signed: 04/14/2015 11:56:23 AM By: Elpidio Eric BSN, RN Entered By: Elpidio Eric on 04/14/2015 11:56:23 Paras, [name] ([id number]) -------------------------------------------------------------------------------- Vitals Details Patient Name: [name] Date of Service: 04/14/2015 11:30 AM Medical Record Number: [id number] Patient Account Number: [id number] Date of Birth/Sex: [birth year] (81 y.o. Female) Treating RN: [name], RN, BSN, Rita Primary Care Physician: PATIENT, NO Other Clinician: Referring Physician: Marisue Ivan Treating Physician/Extender: Rudene Re in Treatment: 6 Vital Signs Time Taken: 11:45 Temperature (F): 97.8 Height (in): 64 Pulse (bpm): 70 Weight (lbs): 147 Respiratory Rate (breaths/min): 18 Body Mass Index (BMI): 25.2 Blood Pressure (mmHg): 110/68 Reference Range: 80 - 120 mg / dl Electronic Signature(s) Signed: 04/14/2015 11:47:47 AM By: Elpidio Eric BSN, RN Entered By: Elpidio Eric on 04/14/2015 11:47:47

## 2015-04-15 NOTE — Progress Notes (Addendum)
SEVILLA, MURTAGH ([id number]) Visit Report for 04/14/2015 Chief Complaint Document Details Patient Name: [name], [name] 04/14/2015 11:30 Date of Service: AM Medical Record [id number] Number: Patient Account Number: [id number] [birth year] (81 y.o. [name], RN, BSN, Date of Birth/Sex: Treating RN: Female) Psychologist, clinical Primary Care Physician: PATIENT, NO Other Clinician: Referring Physician: Marisue Ivan Treating Dwane Andres Physician/Extender: Weeks in Treatment: 6 Information Obtained from: Patient Chief Complaint Patient seen for complaints of Non-Healing Wound. pleasant 81 year old patient who had a fall and injured both lower extremities on 01/31/2015 Electronic Signature(s) Signed: 04/14/2015 12:08:45 PM By: Evlyn Kanner MD, FACS Entered By: Evlyn Kanner on 04/14/2015 12:08:45 Fielden, [name] ([id number]) -------------------------------------------------------------------------------- Debridement Details Patient Name: [name], [name] 04/14/2015 11:30 Date of Service: AM Medical Record [id number] Number: Patient Account Number: [id number] 1913-02-24 (81 y.o. [name], RN, BSN, Date of Birth/Sex: Treating RN: Female) Psychologist, clinical Primary Care Physician: PATIENT, NO Other Clinician: Referring Physician: Marisue Ivan Treating Tajuanna Burnett Physician/Extender: Weeks in Treatment: 6 Debridement Performed for Wound #1 Left,Anterior Lower Leg Assessment: Performed By: Physician Tristan Schroeder., MD Debridement: Open Wound/Selective Debridement Selective Description: Pre-procedure Yes Verification/Time Out Taken: Start Time: 12:02 Pain Control: Lidocaine 4% Topical Solution Level: Non-Viable Tissue Total Area Debrided (L x 7.2 (cm) x 4 (cm) = 28.8 (cm) W): Tissue and other Non-Viable, Exudate, Fibrin/Slough material debrided: Instrument: Forceps Bleeding: Minimum Hemostasis Achieved: Pressure End Time: 12:05 Procedural Pain: 0 Post Procedural Pain: 0 Response to  Treatment: Procedure was tolerated well Post Debridement Measurements of Total Wound Length: (cm) 7.2 Width: (cm) 4 Depth: (cm) 0.5 Volume: (cm) 11.31 Electronic Signature(s) Signed: 04/14/2015 12:08:30 PM By: Evlyn Kanner MD, FACS Signed: 04/14/2015 4:21:36 PM By: Elpidio Eric BSN, RN Previous Signature: 04/14/2015 12:01:03 PM Version By: Elpidio Eric BSN, RN Entered By: Evlyn Kanner on 04/14/2015 12:08:30 Denzer, [name] ([id number]) -------------------------------------------------------------------------------- Debridement Details Patient Name: [name], [name] 04/14/2015 11:30 Date of Service: AM Medical Record [id number] Number: Patient Account Number: [id number] 11/04/1912 (81 y.o. [name], RN, BSN, Date of Birth/Sex: Treating RN: Female) Psychologist, clinical Primary Care Physician: PATIENT, NO Other Clinician: Referring Physician: Marisue Ivan Treating Samyah Bilbo Physician/Extender: Weeks in Treatment: 6 Debridement Performed for Wound #3 Right,Midline,Anterior Lower Leg Assessment: Performed By: Physician Tristan Schroeder., MD Debridement: Open Wound/Selective Debridement Selective Description: Pre-procedure Yes Verification/Time Out Taken: Start Time: 11:58 Pain Control: Lidocaine 4% Topical Solution Level: Non-Viable Tissue Total Area Debrided (L x 3.2 (cm) x 1.8 (cm) = 5.76 (cm) W): Tissue and other Fibrin/Slough material debrided: Instrument: Forceps Bleeding: Minimum Hemostasis Achieved: Pressure End Time: 12:02 Procedural Pain: 0 Post Procedural Pain: 0 Response to Treatment: Procedure was tolerated well Post Debridement Measurements of Total Wound Length: (cm) 3.2 Width: (cm) 1.8 Depth: (cm) 0.2 Volume: (cm) 0.905 Electronic Signature(s) Signed: 04/14/2015 12:08:39 PM By: Evlyn Kanner MD, FACS Signed: 04/14/2015 4:21:36 PM By: Elpidio Eric BSN, RN Previous Signature: 04/14/2015 12:00:09 PM Version By: Elpidio Eric BSN, RN Entered By: Evlyn Kanner on  04/14/2015 12:08:39 Pocius, [name] ([id number]) -------------------------------------------------------------------------------- HPI Details Patient Name: [name], [name] 04/14/2015 11:30 Date of Service: AM Medical Record [id number] Number: Patient Account Number: [id number] July 01, 1913 (81 y.o. [name], RN, BSN, Date of Birth/Sex: Treating RN: Female) Psychologist, clinical Primary Care Physician: PATIENT, NO Other Clinician: Referring Physician: Marisue Ivan Treating Anahita Cua Physician/Extender: Weeks in Treatment: 6 History of Present Illness Location: both lower extremities Quality: Patient reports experiencing a dull pain to affected area(s). Severity: Patient states wound (s) are getting better. Duration: Patient has had the wound for < 4 weeks  prior to presenting for treatment Timing: Pain in wound is Intermittent (comes and goes Context: The wound occurred when the patient had a fall and lacerated both lower extremities. Modifying Factors: Other treatment(s) tried include:she had sutures applied there for a while and when they were removed the wound opened up. Associated Signs and Symptoms: Patient reports having difficulty standing for long periods. HPI Description: 81 year old female who fell and had an injury to her right forehead, right elbow and both shins on 01/31/2015. She went to the ER at [facility] and had been treated appropriately with sutures being placed to her right and left lower extremity. Also had a CT scan which showed no intracranial hemorrhage and only a large scalp hematoma on the right frontal region.she was put on Keflex for 5 days. she had also received Bactrim for 14 days.Since then she has been seen by her PCP and he had put her on Augmentin for 14 days and wound culture was obtained. culture reports were reviewed -- she grew an MRSA sensitive to tetracycline, Bactrim and vancomycin. Her past medical history significant for osteoporosis,  hypertension, spinal stenosis, right hip replacement, appendectomy and abdomen hysterectomy for ovarian cancer. She has been seen in the past by the vascular surgery group at [facility] and she has an appointment to see them again. She is known to use lymphedema pumps while she has been in her assisted living home but has not been using them for the last month. She does also have compression stockings which she uses intermittently. 03/17/2015 -- She was seen by Dr. Gilda Crease on 03/03/2015 -- he reviewed the patientos case and recommended no surgical intervention. He has recommended compression stockings of the 20-30 mmHg variety and also recommended lymphedema pumps. He will see her back in 3 months. The patient has developed some blisters on both lower extremities and this may be due to the fact that she's had a Kerlix gauze wrap on both lower extremities and is also using the lymphedema pumps. Electronic Signature(s) Signed: 04/14/2015 12:08:53 PM By: Evlyn Kanner MD, FACS Entered By: Evlyn Kanner on 04/14/2015 12:08:52 Isidro, [name] ([id number]) -------------------------------------------------------------------------------- Physical Exam Details Patient Name: [name], [name] 04/14/2015 11:30 Date of Service: AM Medical Record [id number] Number: Patient Account Number: [id number] 06-Apr-1913 (81 y.o. [name], RN, BSN, Date of Birth/Sex: Treating RN: Female) Psychologist, clinical Primary Care Physician: PATIENT, NO Other Clinician: Referring Physician: Marisue Ivan Treating Maricsa Sammons Physician/Extender: Weeks in Treatment: 6 Constitutional . Pulse regular. Respirations normal and unlabored. Afebrile. . Eyes Nonicteric. Reactive to light. Ears, Nose, Mouth, and Throat Lips, teeth, and gums WNL.Marland Kitchen Moist mucosa without lesions . Neck supple and nontender. No palpable supraclavicular or cervical adenopathy. Normal sized without goiter. Respiratory WNL. No  retractions.. Cardiovascular Pedal Pulses WNL. No clubbing, cyanosis or edema. Chest Breasts symmetical and no nipple discharge.. Breast tissue WNL, no masses, lumps, or tenderness.. Lymphatic No adneopathy. No adenopathy. No adenopathy. Musculoskeletal Adexa without tenderness or enlargement.. Digits and nails w/o clubbing, cyanosis, infection, petechiae, ischemia, or inflammatory conditions.. Integumentary (Hair, Skin) No suspicious lesions. No crepitus or fluctuance. No peri-wound warmth or erythema. No masses.Marland Kitchen Psychiatric Judgement and insight Intact.. No evidence of depression, anxiety, or agitation.. Notes After sharply debriding the wounds on the left lower extremity I have recommended a change in the dressing. the right lower extremity also has minimal slough and sharp debridement will be carried out. Electronic Signature(s) Signed: 04/14/2015 12:09:55 PM By: Evlyn Kanner MD, FACS Entered By: Evlyn Kanner on 04/14/2015 12:09:54 [name],  [name] ([id number]) -------------------------------------------------------------------------------- Physician Orders Details Patient Name: [name], [name] 04/14/2015 11:30 Date of Service: AM Medical Record [id number] Number: Patient Account Number: [id number] May 05, 1913 (81 y.o. [name], RN, BSN, Date of Birth/Sex: Treating RN: Female) Psychologist, clinical Primary Care Physician: PATIENT, NO Other Clinician: Referring Physician: Marisue Ivan Treating Bode Pieper Physician/Extender: Weeks in Treatment: 6 Verbal / Phone Orders: Yes Clinician: [name], RN, BSN, Rita Read Back and Verified: Yes Diagnosis Coding ICD-10 Coding Code Description [phone] Non-pressure chronic ulcer of right calf with fat layer exposed L97.222 Non-pressure chronic ulcer of left calf with fat layer exposed I89.0 Lymphedema, not elsewhere classified Wound Cleansing Wound #1 Left,Anterior Lower Leg o Cleanse wound with mild soap and water o May Shower, gently  pat wound dry prior to applying new dressing. Wound #2 Right,Anterior Lower Leg o Cleanse wound with mild soap and water o May Shower, gently pat wound dry prior to applying new dressing. Wound #3 Right,Midline,Anterior Lower Leg o Cleanse wound with mild soap and water o May Shower, gently pat wound dry prior to applying new dressing. Anesthetic Wound #1 Left,Anterior Lower Leg o Topical Lidocaine 4% cream applied to wound bed prior to debridement Wound #2 Right,Anterior Lower Leg o Topical Lidocaine 4% cream applied to wound bed prior to debridement Wound #3 Right,Midline,Anterior Lower Leg o Topical Lidocaine 4% cream applied to wound bed prior to debridement Primary Wound Dressing Wound #1 Left,Anterior Lower Leg o Aquacel Ag Wound #2 Right,Anterior Lower Leg o Aquacel Ag Rudge, Chanice L. ([id number]) Wound #3 Right,Midline,Anterior Lower Leg o Aquacel Ag Secondary Dressing Wound #1 Left,Anterior Lower Leg o ABD and Kerlix/Conform Wound #2 Right,Anterior Lower Leg o ABD and Kerlix/Conform Wound #3 Right,Midline,Anterior Lower Leg o ABD and Kerlix/Conform Dressing Change Frequency Wound #1 Left,Anterior Lower Leg o Change dressing every other day. Wound #2 Right,Anterior Lower Leg o Change dressing every other day. Wound #3 Right,Midline,Anterior Lower Leg o Change dressing every other day. Follow-up Appointments Wound #1 Left,Anterior Lower Leg o Return Appointment in 1 week. Wound #2 Right,Anterior Lower Leg o Return Appointment in 1 week. Wound #3 Right,Midline,Anterior Lower Leg o Return Appointment in 1 week. Edema Control Wound #1 Left,Anterior Lower Leg o Tubigrip Wound #2 Right,Anterior Lower Leg o Tubigrip Wound #3 Right,Midline,Anterior Lower Leg o Tubigrip Electronic Signature(s) Signed: 04/14/2015 12:15:28 PM By: Elpidio Eric BSN, RN Stavola, [name] ([id number]) Signed: 04/14/2015 1:56:54 PM By: Evlyn Kanner  MD, FACS Entered By: Elpidio Eric on 04/14/2015 12:15:28 Welling, [name] ([id number]) -------------------------------------------------------------------------------- Problem List Details Patient Name: [name], [name] 04/14/2015 11:30 Date of Service: AM Medical Record [id number] Number: Patient Account Number: [id number] September 18, 1912 (81 y.o. [name], RN, BSN, Date of Birth/Sex: Treating RN: Female) Psychologist, clinical Primary Care Physician: PATIENT, NO Other Clinician: Referring Physician: Clelia Croft, Maniyah Moller Physician/Extender: Weeks in Treatment: 6 Active Problems ICD-10 Encounter Code Description Active Date Diagnosis L97.212 Non-pressure chronic ulcer of right calf with fat layer 03/03/2015 Yes exposed L97.222 Non-pressure chronic ulcer of left calf with fat layer 03/03/2015 Yes exposed I89.0 Lymphedema, not elsewhere classified 03/03/2015 Yes Inactive Problems Resolved Problems Electronic Signature(s) Signed: 04/14/2015 12:08:22 PM By: Evlyn Kanner MD, FACS Entered By: Evlyn Kanner on 04/14/2015 12:08:22 Holster, [name] ([id number]) -------------------------------------------------------------------------------- Progress Note Details Patient Name: [name], [name] 04/14/2015 11:30 Date of Service: AM Medical Record [id number] Number: Patient Account Number: [id number] 1913/05/09 (81 y.o. [name], RN, BSN, Date of Birth/Sex: Treating RN: Female) Psychologist, clinical Primary Care Physician: PATIENT, NO Other Clinician: Referring Physician: Clelia Croft, Aleera Gilcrease Physician/Extender:  Weeks in Treatment: 6 Subjective Chief Complaint Information obtained from Patient Patient seen for complaints of Non-Healing Wound. pleasant 81 year old patient who had a fall and injured both lower extremities on 01/31/2015 History of Present Illness (HPI) The following HPI elements were documented for the patient's wound: Location: both lower extremities Quality: Patient  reports experiencing a dull pain to affected area(s). Severity: Patient states wound (s) are getting better. Duration: Patient has had the wound for < 4 weeks prior to presenting for treatment Timing: Pain in wound is Intermittent (comes and goes Context: The wound occurred when the patient had a fall and lacerated both lower extremities. Modifying Factors: Other treatment(s) tried include:she had sutures applied there for a while and when they were removed the wound opened up. Associated Signs and Symptoms: Patient reports having difficulty standing for long periods. 81 year old female who fell and had an injury to her right forehead, right elbow and both shins on 01/31/2015. She went to the ER at [facility] and had been treated appropriately with sutures being placed to her right and left lower extremity. Also had a CT scan which showed no intracranial hemorrhage and only a large scalp hematoma on the right frontal region.she was put on Keflex for 5 days. she had also received Bactrim for 14 days.Since then she has been seen by her PCP and he had put her on Augmentin for 14 days and wound culture was obtained. culture reports were reviewed -- she grew an MRSA sensitive to tetracycline, Bactrim and vancomycin. Her past medical history significant for osteoporosis, hypertension, spinal stenosis, right hip replacement, appendectomy and abdomen hysterectomy for ovarian cancer. She has been seen in the past by the vascular surgery group at [facility] and she has an appointment to see them again. She is known to use lymphedema pumps while she has been in her assisted living home but has not been using them for the last month. She does also have compression stockings which she uses intermittently. 03/17/2015 -- She was seen by Dr. Gilda Crease on 03/03/2015 -- he reviewed the patient s case and recommended no surgical intervention. He has recommended compression stockings of the 20-30  mmHg variety and also recommended lymphedema pumps. He will see her back in 3 months. The patient has developed some blisters on both lower extremities and this may be due to the fact that [name], Leah L. ([id number]) she's had a Kerlix gauze wrap on both lower extremities and is also using the lymphedema pumps. Objective Constitutional Pulse regular. Respirations normal and unlabored. Afebrile. Vitals Time Taken: 11:45 AM, Height: 64 in, Weight: 147 lbs, BMI: 25.2, Temperature: 97.8 F, Pulse: 70 bpm, Respiratory Rate: 18 breaths/min, Blood Pressure: 110/68 mmHg. Eyes Nonicteric. Reactive to light. Ears, Nose, Mouth, and Throat Lips, teeth, and gums WNL.Marland Kitchen Moist mucosa without lesions . Neck supple and nontender. No palpable supraclavicular or cervical adenopathy. Normal sized without goiter. Respiratory WNL. No retractions.. Cardiovascular Pedal Pulses WNL. No clubbing, cyanosis or edema. Chest Breasts symmetical and no nipple discharge.. Breast tissue WNL, no masses, lumps, or tenderness.. Lymphatic No adneopathy. No adenopathy. No adenopathy. Musculoskeletal Adexa without tenderness or enlargement.. Digits and nails w/o clubbing, cyanosis, infection, petechiae, ischemia, or inflammatory conditions.Marland Kitchen Psychiatric Judgement and insight Intact.. No evidence of depression, anxiety, or agitation.. General Notes: After sharply debriding the wounds on the left lower extremity I have recommended a change in the dressing. the right lower extremity also has minimal slough and sharp debridement will be carried out. Integumentary (Hair, Skin) [name],  Leah L. ([id number]) No suspicious lesions. No crepitus or fluctuance. No peri-wound warmth or erythema. No masses.. Wound #1 status is Open. Original cause of wound was Trauma. The wound is located on the Left,Anterior Lower Leg. The wound measures 7.2cm length x 4cm width x 0.5cm depth; 22.619cm^2 area and 11.31cm^3 volume. The wound is  limited to skin breakdown. There is no tunneling or undermining noted. There is a medium amount of serosanguineous drainage noted. The wound margin is distinct with the outline attached to the wound base. There is medium (34-66%) pink, pale granulation within the wound bed. There is a medium (34-66%) amount of necrotic tissue within the wound bed including Eschar and Adherent Slough. The periwound skin appearance exhibited: Localized Edema, Moist. The periwound skin appearance did not exhibit: Callus, Crepitus, Excoriation, Fluctuance, Friable, Induration, Rash, Scarring, Dry/Scaly, Maceration, Atrophie Blanche, Cyanosis, Ecchymosis, Hemosiderin Staining, Mottled, Pallor, Rubor, Erythema. Periwound temperature was noted as No Abnormality. The periwound has tenderness on palpation. Wound #2 status is Open. Original cause of wound was Trauma. The wound is located on the Right,Anterior Lower Leg. The wound measures 1cm length x 1.4cm width x 0.1cm depth; 1.1cm^2 area and 0.11cm^3 volume. The wound is limited to skin breakdown. There is no tunneling or undermining noted. There is a medium amount of serosanguineous drainage noted. The wound margin is distinct with the outline attached to the wound base. There is medium (34-66%) red granulation within the wound bed. There is a medium (34-66%) amount of necrotic tissue within the wound bed including Adherent Slough. The periwound skin appearance exhibited: Localized Edema, Moist. The periwound skin appearance did not exhibit: Callus, Crepitus, Excoriation, Fluctuance, Friable, Induration, Rash, Scarring, Dry/Scaly, Maceration, Atrophie Blanche, Cyanosis, Ecchymosis, Hemosiderin Staining, Mottled, Pallor, Rubor, Erythema. Periwound temperature was noted as No Abnormality. The periwound has tenderness on palpation. Wound #3 status is Open. Original cause of wound was Blister. The wound is located on the Right,Midline,Anterior Lower Leg. The wound  measures 3.2cm length x 1.8cm width x 0.2cm depth; 4.524cm^2 area and 0.905cm^3 volume. The wound is limited to skin breakdown. There is no tunneling or undermining noted. There is a small amount of serosanguineous drainage noted. The wound margin is indistinct and nonvisible. There is medium (34-66%) pink, pale granulation within the wound bed. There is a small (1-33%) amount of necrotic tissue within the wound bed including Adherent Slough. The periwound skin appearance exhibited: Localized Edema, Moist. The periwound skin appearance did not exhibit: Callus, Crepitus, Excoriation, Fluctuance, Friable, Induration, Rash, Scarring, Dry/Scaly, Maceration, Atrophie Blanche, Cyanosis, Ecchymosis, Hemosiderin Staining, Mottled, Pallor, Rubor, Erythema. Periwound temperature was noted as No Abnormality. The periwound has tenderness on palpation. 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 ([id number]) I have recommended changing the dressing to silver alginate to all the wounds and we will continue to use a light dressing in place to keep the gauze and position. She will come back and see me next week. Procedures Wound #1 Wound #1 is a Skin Tear located on the Left,Anterior Lower Leg . There was a Non-Viable Tissue Open Wound/Selective [phone]) debridement with total area of 28.8 sq cm performed by Zarius Furr, Ignacia Felling., MD. with the following instrument(s): Forceps to remove Non-Viable tissue/material including Exudate and Fibrin/Slough after achieving pain control using Lidocaine 4% Topical Solution. A time out was conducted prior to the  start of the procedure. A Minimum amount of bleeding was controlled with Pressure. The procedure was tolerated well with a pain level of 0 throughout and a pain  level of 0 following the procedure. Post Debridement Measurements: 7.2cm length x 4cm width x 0.5cm depth; 11.31cm^3 volume. Wound #3 Wound #3 is a Lymphedema located on the Right,Midline,Anterior Lower Leg . There was a Non-Viable Tissue Open Wound/Selective [phone]) debridement with total area of 5.76 sq cm performed by Kima Malenfant, Ignacia Felling., MD. with the following instrument(s): Forceps including Fibrin/Slough after achieving pain control using Lidocaine 4% Topical Solution. A time out was conducted prior to the start of the procedure. A Minimum amount of bleeding was controlled with Pressure. The procedure was tolerated well with a pain level of 0 throughout and a pain level of 0 following the procedure. Post Debridement Measurements: 3.2cm length x 1.8cm width x 0.2cm depth; 0.905cm^3 volume. Plan Wound Cleansing: Wound #1 Left,Anterior Lower Leg: Cleanse wound with mild soap and water May Shower, gently pat wound dry prior to applying new dressing. Wound #2 Right,Anterior Lower Leg: Cleanse wound with mild soap and water May Shower, gently pat wound dry prior to applying new dressing. Wound #3 Right,Midline,Anterior Lower Leg: Cleanse wound with mild soap and water May Shower, gently pat wound dry prior to applying new dressing. Anesthetic: Wound #1 Left,Anterior Lower Leg: Topical Lidocaine 4% cream applied to wound bed prior to debridement Wound #2 Right,Anterior Lower Leg: Topical Lidocaine 4% cream applied to wound bed prior to debridement Abed, Casaundra L. ([id number]) Wound #3 Right,Midline,Anterior Lower Leg: Topical Lidocaine 4% cream applied to wound bed prior to debridement Primary Wound Dressing: Wound #1 Left,Anterior Lower Leg: Aquacel Ag Wound #2 Right,Anterior Lower Leg: Aquacel Ag Wound #3 Right,Midline,Anterior Lower Leg: Aquacel Ag Secondary Dressing: Wound #1 Left,Anterior Lower Leg: ABD and Kerlix/Conform Wound #2 Right,Anterior Lower Leg: ABD and  Kerlix/Conform Wound #3 Right,Midline,Anterior Lower Leg: ABD and Kerlix/Conform Dressing Change Frequency: Wound #1 Left,Anterior Lower Leg: Change dressing every other day. Wound #2 Right,Anterior Lower Leg: Change dressing every other day. Wound #3 Right,Midline,Anterior Lower Leg: Change dressing every other day. Follow-up Appointments: Wound #1 Left,Anterior Lower Leg: Return Appointment in 1 week. Wound #2 Right,Anterior Lower Leg: Return Appointment in 1 week. Wound #3 Right,Midline,Anterior Lower Leg: Return Appointment in 1 week. Edema Control: Wound #1 Left,Anterior Lower Leg: Tubigrip Wound #2 Right,Anterior Lower Leg: Tubigrip Wound #3 Right,Midline,Anterior Lower Leg: Tubigrip I have recommended changing the dressing to silver alginate to all the wounds and we will continue to use a light dressing in place to keep the gauze and position. She will come back and see me next week. SUBRINA, VECCHIARELLI ([id number]) Electronic Signature(s) Signed: 04/14/2015 4:30:59 PM By: Evlyn Kanner MD, FACS Previous Signature: 04/14/2015 12:10:50 PM Version By: Evlyn Kanner MD, FACS Entered By: Evlyn Kanner on 04/14/2015 16:30:58 Gammon, [name] ([id number]) -------------------------------------------------------------------------------- SuperBill Details Patient Name: [name] Date of Service: 04/14/2015 Medical Record Patient Account Number: [id number] Number: [name], RN, BSN, Treating RN: 1913/05/18 (81 y.o. [place] Date of Birth/Sex: Female) Other Clinician: Primary Care Physician: PATIENT, NO Treating Capria Cartaya Referring Physician: Marisue Ivan Physician/Extender: Weeks in Treatment: 6 Diagnosis Coding ICD-10 Codes Code Description [phone] Non-pressure chronic ulcer of right calf with fat layer exposed L97.222 Non-pressure chronic ulcer of left calf with fat layer exposed I89.0 Lymphedema, not elsewhere classified Facility Procedures CPT4  Code: 88416606 Description: 97597 - DEBRIDE WOUND 1ST 20 SQ CM OR < ICD-10 Description Diagnosis L97.212 Non-pressure chronic  ulcer of right calf with fat l L97.222 Non-pressure chronic ulcer of left calf with fat la I89.0 Lymphedema, not elsewhere classified Modifier: ayer exposed yer exposed Quantity: 1 CPT4 Code: 81191478 Description: 97598 - DEBRIDE WOUND EA ADDL 20 SQ CM ICD-10 Description Diagnosis L97.212 Non-pressure chronic ulcer of right calf with fat l L97.222 Non-pressure chronic ulcer of left calf with fat la I89.0 Lymphedema, not elsewhere classified Modifier: ayer exposed yer exposed Quantity: 1 Physician Procedures CPT4 Code: 2956213 Description: 97597 - WC PHYS DEBR WO ANESTH 20 SQ CM ICD-10 Description Diagnosis L97.212 Non-pressure chronic ulcer of right calf with fat l L97.222 Non-pressure chronic ulcer of left calf with fat la I89.0 Lymphedema, not elsewhere classified Modifier: ayer exposed yer exposed Quantity: 1 CPT4 Code: 0865784 Felten, Derhonda Description: 97598 - WC PHYS DEBR WO ANESTH EA ADD 20 CM L. ([id number]) Modifier: Quantity: 1 Electronic Signature(s) Signed: 04/14/2015 12:11:05 PM By: Evlyn Kanner MD, FACS Entered By: Evlyn Kanner on 04/14/2015 12:11:05

## 2015-04-21 ENCOUNTER — Ambulatory Visit: Payer: Medicare Other | Admitting: Surgery

## 2015-04-22 ENCOUNTER — Encounter: Payer: Medicare Other | Admitting: Surgery

## 2015-04-22 DIAGNOSIS — L97212 Non-pressure chronic ulcer of right calf with fat layer exposed: Secondary | ICD-10-CM | POA: Diagnosis not present

## 2015-04-22 NOTE — Progress Notes (Signed)
[name], [name] ([id number]) Visit Report for 04/22/2015 Chief Complaint Document Details Patient Name: [name], [name] 04/22/2015 11:30 Date of Service: AM Medical Record [id number] Number: Patient Account Number: [id number] [birth year] (81 y.o. [name]: [name] Date of Birth/Sex: Female) Other Clinician: Primary Care Physician: [name] [name] Referring Physician: Marisue Yu Physician/Extender: [name]: 7 Information Obtained from: Patient Chief Complaint Patient seen for complaints of Non-Healing Wound. pleasant 81 year old patient who had a fall and injured both lower extremities on 01/31/2015 Electronic Signature(s) Signed: 04/22/2015 12:05:45 PM By: Leah Kanner MD, FACS Entered By: [name] on 04/22/2015 12:05:45 [name], [name] ([id number]) -------------------------------------------------------------------------------- HPI Details Patient Name: [name], [name] 04/22/2015 11:30 Date of Service: AM Medical Record [id number] Number: Patient Account Number: [id number] 08-28-1912 (81 y.o. [name]: [name] Date of Birth/Sex: Female) Other Clinician: Primary Care Physician: [name] [name] Referring Physician: Marisue Yu Physician/Extender: [name]: 7 History of Present Illness Location: both lower extremities Quality: Patient reports experiencing a dull pain to affected area(s). Severity: Patient states wound (s) are getting better. Duration: Patient has had the wound for < 4 Leah prior to presenting for Yu Timing: Pain in wound is Intermittent (comes and goes Context: The wound occurred when the patient had a fall and lacerated both lower extremities. Modifying Factors: Other Yu(s) tried include:she had sutures applied there for a while and when they were removed the wound opened up. Associated Signs and Symptoms: Patient reports having difficulty standing  for long periods. HPI Description: 81 year old female who fell and had an injury to her right forehead, right elbow and both shins on 01/31/2015. She went to the ER at [facility] and had been treated appropriately with sutures being placed to her right and left lower extremity. Also had a CT scan which showed Yu intracranial hemorrhage and only a large scalp hematoma on the right frontal region.she was put on Keflex for 5 days. she had also received Bactrim for 14 days.Since then she has been seen by her PCP and he had put her on Augmentin for 14 days and wound culture was obtained. culture reports were reviewed -- she grew an MRSA sensitive to tetracycline, Bactrim and vancomycin. Her past medical history significant for osteoporosis, hypertension, spinal stenosis, right hip replacement, appendectomy and abdomen hysterectomy for ovarian cancer. She has been seen in the past by the vascular surgery group at [facility] and she has an appointment to see them again. She is known to use lymphedema pumps while she has been in her assisted living home but has not been using them for the last month. She does also have compression stockings which she uses intermittently. 03/17/2015 -- She was seen by Dr. Gilda Yu on 03/03/2015 -- he reviewed the patientos case and recommended Yu surgical intervention. He has recommended compression stockings of the 20-30 mmHg variety and also recommended lymphedema pumps. He will see her back in 3 months. The patient has developed some blisters on both lower extremities and this may be due to the fact that she's had a Kerlix gauze wrap on both lower extremities and is also using the lymphedema pumps. Electronic Signature(s) Signed: 04/22/2015 12:05:56 PM By: Leah Kanner MD, FACS Entered By: [name] on 04/22/2015 12:05:56 [name], [name] ([id number]) -------------------------------------------------------------------------------- Physical Exam  Details Patient Name: [name], [name] 04/22/2015 11:30 Date of Service: AM Medical Record [id number] Number: Patient Account Number: [id number] October 03, 1912 (81 y.o. [name]: [name] Date of Birth/Sex: Female) Other Clinician:  Primary Care Physician: [name] [name] Referring Physician: Marisue Yu Physician/Extender: [name]: 7 Constitutional . Pulse regular. Respirations normal and unlabored. Afebrile. . Eyes Nonicteric. Reactive to light. Ears, Nose, Mouth, and Throat Lips, teeth, and gums WNL.Marland Kitchen Moist mucosa without lesions . Neck supple and nontender. Yu palpable supraclavicular or cervical adenopathy. Normal sized without goiter. Respiratory WNL. Yu retractions.. Cardiovascular Pedal Pulses WNL. Yu clubbing, cyanosis or edema. Chest Breasts symmetical and Yu nipple discharge.. Breast tissue WNL, Yu masses, lumps, or tenderness.. Lymphatic Yu adneopathy. Yu adenopathy. Yu adenopathy. Musculoskeletal Adexa without tenderness or enlargement.. Digits and nails w/o clubbing, cyanosis, infection, petechiae, ischemia, or inflammatory conditions.. Integumentary (Hair, Skin) Yu suspicious lesions. Yu crepitus or fluctuance. Yu peri-wound warmth or erythema. Yu masses.Marland Kitchen Psychiatric Judgement and insight Intact.. Yu evidence of depression, anxiety, or agitation.. Notes The wounds are much cleaner today and there is not much of slough to debride sharply. I washed it out with a saline gauze. She has a new blister on the dorsum of her right foot which has been evacuated. Electronic Signature(s) Signed: 04/22/2015 12:06:42 PM By: Leah Kanner MD, FACS Entered By: [name] on 04/22/2015 12:06:41 [name], [name] ([id number]) -------------------------------------------------------------------------------- Physician Orders Details Patient Name: [name], [name] 04/22/2015 11:30 Date of Service: AM Medical  Record [id number] Number: Patient Account Number: [id number] 03/07/1913 (81 y.o. [name], [name], [name], Date of Birth/Sex: [name]: Female) Psychologist, clinical Primary Care Physician: [name] Other Clinician: Referring Physician: Marisue Yu Treating Dicky Boer Physician/Extender: [name]: 7 Verbal / Phone Orders: Yes Clinician: [name], [name], [name], Rita Read Back and Verified: Yes Diagnosis Coding Wound Cleansing Wound #1 Left,Anterior Lower Leg o Cleanse wound with mild soap and water o May Shower, gently Leah wound dry prior to applying new dressing. Wound #3 Right,Midline,Anterior Lower Leg o Cleanse wound with mild soap and water o May Shower, gently Leah wound dry prior to applying new dressing. Anesthetic Wound #1 Left,Anterior Lower Leg o Topical Lidocaine 4% cream applied to wound bed prior to debridement Wound #3 Right,Midline,Anterior Lower Leg o Topical Lidocaine 4% cream applied to wound bed prior to debridement Primary Wound Dressing Wound #1 Left,Anterior Lower Leg o Aquacel Ag Wound #3 Right,Midline,Anterior Lower Leg o Aquacel Ag Secondary Dressing Wound #1 Left,Anterior Lower Leg o ABD and Kerlix/Conform Wound #3 Right,Midline,Anterior Lower Leg o ABD and Kerlix/Conform Dressing Change Frequency Wound #1 Left,Anterior Lower Leg o Change dressing every other day. [name], Leah L. ([id number]) Wound #3 Right,Midline,Anterior Lower Leg o Change dressing every other day. Follow-up Appointments Wound #1 Left,Anterior Lower Leg o Return Appointment in 1 week. Wound #3 Right,Midline,Anterior Lower Leg o Return Appointment in 1 week. Edema Control Wound #1 Left,Anterior Lower Leg o Tubigrip Wound #3 Right,Midline,Anterior Lower Leg o Tubigrip Electronic Signature(s) Signed: 04/22/2015 12:02:28 PM By: Elpidio Eric BSN, [name] Signed: 04/22/2015 2:53:21 PM By: Leah Kanner MD, FACS Entered By: Elpidio Eric on 04/22/2015  12:02:28 [place], [name] ([id number]) -------------------------------------------------------------------------------- Problem List Details Patient Name: [name], [name] 04/22/2015 11:30 Date of Service: AM Medical Record [id number] Number: Patient Account Number: [id number] Feb 15, 1913 (81 y.o. [name]: [name] Date of Birth/Sex: Female) Other Clinician: Primary Care Physician: [name] Treating Hoyle Barkdull Referring Physician: Marisue Yu Physician/Extender: [name]: 7 Active Problems ICD-10 Encounter Code Description Active Date Diagnosis L97.212 Non-pressure chronic ulcer of right calf with fat layer 03/03/2015 Yes exposed L97.222 Non-pressure chronic ulcer of left calf with fat layer 03/03/2015 Yes exposed I89.0 Lymphedema, not elsewhere classified 03/03/2015  Yes Inactive Problems Resolved Problems Electronic Signature(s) Signed: 04/22/2015 12:05:39 PM By: Leah Kanner MD, FACS Entered By: [name] on 04/22/2015 12:05:39 Nanna, [name] ([id number]) -------------------------------------------------------------------------------- Progress Note Details Patient Name: [name], [name] 04/22/2015 11:30 Date of Service: AM Medical Record [id number] Number: Patient Account Number: [id number] [birth year] (81 y.o. [name]: [name] Date of Birth/Sex: Female) Other Clinician: Primary Care Physician: [name] Treating Ferdinando Lodge Referring Physician: Marisue Yu Physician/Extender: [name]: 7 Subjective Chief Complaint Information obtained from Patient Patient seen for complaints of Non-Healing Wound. pleasant 81 year old patient who had a fall and injured both lower extremities on 01/31/2015 History of Present Illness (HPI) The following HPI elements were documented for the patient's wound: Location: both lower extremities Quality: Patient reports experiencing a dull pain to affected area(s). Severity: Patient  states wound (s) are getting better. Duration: Patient has had the wound for < 4 Leah prior to presenting for Yu Timing: Pain in wound is Intermittent (comes and goes Context: The wound occurred when the patient had a fall and lacerated both lower extremities. Modifying Factors: Other Yu(s) tried include:she had sutures applied there for a while and when they were removed the wound opened up. Associated Signs and Symptoms: Patient reports having difficulty standing for long periods. 81 year old female who fell and had an injury to her right forehead, right elbow and both shins on 01/31/2015. She went to the ER at [facility] and had been treated appropriately with sutures being placed to her right and left lower extremity. Also had a CT scan which showed Yu intracranial hemorrhage and only a large scalp hematoma on the right frontal region.she was put on Keflex for 5 days. she had also received Bactrim for 14 days.Since then she has been seen by her PCP and he had put her on Augmentin for 14 days and wound culture was obtained. culture reports were reviewed -- she grew an MRSA sensitive to tetracycline, Bactrim and vancomycin. Her past medical history significant for osteoporosis, hypertension, spinal stenosis, right hip replacement, appendectomy and abdomen hysterectomy for ovarian cancer. She has been seen in the past by the vascular surgery group at [facility] and she has an appointment to see them again. She is known to use lymphedema pumps while she has been in her assisted living home but has not been using them for the last month. She does also have compression stockings which she uses intermittently. 03/17/2015 -- She was seen by Dr. Gilda Yu on 03/03/2015 -- he reviewed the patient s case and recommended Yu surgical intervention. He has recommended compression stockings of the 20-30 mmHg variety and also recommended lymphedema pumps. He will see her back in 3  months. The patient has developed some blisters on both lower extremities and this may be due to the fact that [name], Leah L. ([id number]) she's had a Kerlix gauze wrap on both lower extremities and is also using the lymphedema pumps. Objective Constitutional Pulse regular. Respirations normal and unlabored. Afebrile. Vitals Time Taken: 11:40 AM, Height: 64 in, Weight: 147 lbs, BMI: 25.2, Temperature: 97.9 F, Pulse: 66 bpm, Respiratory Rate: 17 breaths/min, Blood Pressure: 143/52 mmHg. Eyes Nonicteric. Reactive to light. Ears, Nose, Mouth, and Throat Lips, teeth, and gums WNL.Marland Kitchen Moist mucosa without lesions . Neck supple and nontender. Yu palpable supraclavicular or cervical adenopathy. Normal sized without goiter. Respiratory WNL. Yu retractions.. Cardiovascular Pedal Pulses WNL. Yu clubbing, cyanosis or edema. Chest Breasts symmetical and Yu nipple discharge.. Breast tissue WNL, Yu masses, lumps, or tenderness.Marland Kitchen  Lymphatic Yu adneopathy. Yu adenopathy. Yu adenopathy. Musculoskeletal Adexa without tenderness or enlargement.. Digits and nails w/o clubbing, cyanosis, infection, petechiae, ischemia, or inflammatory conditions.Marland Kitchen Psychiatric Judgement and insight Intact.. Yu evidence of depression, anxiety, or agitation.. General Notes: The wounds are much cleaner today and there is not much of slough to debride sharply. I washed it out with a saline gauze. She has a new blister on the dorsum of her right foot which has been evacuated. Integumentary (Hair, Skin) [name], [name] L. ([id number]) Yu suspicious lesions. Yu crepitus or fluctuance. Yu peri-wound warmth or erythema. Yu masses.. Wound #1 status is Open. Original cause of wound was Trauma. The wound is located on the Left,Anterior Lower Leg. The wound measures 8cm length x 3cm width x 0.4cm depth; 18.85cm^2 area and 7.54cm^3 volume. The wound is limited to skin breakdown. There is Yu tunneling or undermining noted. There is  a medium amount of serosanguineous drainage noted. The wound margin is distinct with the outline attached to the wound base. There is medium (34-66%) pink, pale granulation within the wound bed. There is a medium (34-66%) amount of necrotic tissue within the wound bed including Eschar and Adherent Slough. The periwound skin appearance exhibited: Localized Edema, Moist. The periwound skin appearance did not exhibit: Callus, Crepitus, Excoriation, Fluctuance, Friable, Induration, Rash, Scarring, Dry/Scaly, Maceration, Atrophie Blanche, Cyanosis, Ecchymosis, Hemosiderin Staining, Mottled, Pallor, Rubor, Erythema. Periwound temperature was noted as Yu Abnormality. The periwound has tenderness on palpation. Wound #2 status is Healed - Epithelialized. Original cause of wound was Trauma. The wound is located on the Right,Anterior Lower Leg. The wound measures 0cm length x 0cm width x 0cm depth; 0cm^2 area and 0cm^3 volume. The wound is limited to skin breakdown. There is Yu tunneling or undermining noted. There is a none present amount of drainage noted. The wound margin is distinct with the outline attached to the wound base. There is medium (34-66%) red granulation within the wound bed. There is Yu necrotic tissue within the wound bed. The periwound skin appearance exhibited: Localized Edema, Dry/Scaly. The periwound skin appearance did not exhibit: Callus, Crepitus, Excoriation, Fluctuance, Friable, Induration, Rash, Scarring, Maceration, Moist, Atrophie Blanche, Cyanosis, Ecchymosis, Hemosiderin Staining, Mottled, Pallor, Rubor, Erythema. Periwound temperature was noted as Yu Abnormality. Wound #3 status is Open. Original cause of wound was Blister. The wound is located on the Right,Midline,Anterior Lower Leg. The wound measures 3.1cm length x 1.8cm width x 0.2cm depth; 4.383cm^2 area and 0.877cm^3 volume. The wound is limited to skin breakdown. There is Yu tunneling or undermining noted. There is  a small amount of serosanguineous drainage noted. The wound margin is indistinct and nonvisible. There is medium (34-66%) pink, pale granulation within the wound bed. There is a small (1-33%) amount of necrotic tissue within the wound bed including Adherent Slough. The periwound skin appearance exhibited: Localized Edema, Moist. The periwound skin appearance did not exhibit: Callus, Crepitus, Excoriation, Fluctuance, Friable, Induration, Rash, Scarring, Dry/Scaly, Maceration, Atrophie Blanche, Cyanosis, Ecchymosis, Hemosiderin Staining, Mottled, Pallor, Rubor, Erythema. Periwound temperature was noted as Yu Abnormality. The periwound has tenderness on palpation. Assessment Active Problems ICD-10 L97.212 - Non-pressure chronic ulcer of right calf with fat layer exposed L97.222 - Non-pressure chronic ulcer of left calf with fat layer exposed I89.0 - Lymphedema, not elsewhere classified [place], Annalisa L. ([id number]) I have recommended dressing with silver alginate to all the wounds and we will continue to use a light dressing in place to keep the gauze and position. She will come back and see me  next week. Plan Wound Cleansing: Wound #1 Left,Anterior Lower Leg: Cleanse wound with mild soap and water May Shower, gently Leah wound dry prior to applying new dressing. Wound #3 Right,Midline,Anterior Lower Leg: Cleanse wound with mild soap and water May Shower, gently Leah wound dry prior to applying new dressing. Anesthetic: Wound #1 Left,Anterior Lower Leg: Topical Lidocaine 4% cream applied to wound bed prior to debridement Wound #3 Right,Midline,Anterior Lower Leg: Topical Lidocaine 4% cream applied to wound bed prior to debridement Primary Wound Dressing: Wound #1 Left,Anterior Lower Leg: Aquacel Ag Wound #3 Right,Midline,Anterior Lower Leg: Aquacel Ag Secondary Dressing: Wound #1 Left,Anterior Lower Leg: ABD and Kerlix/Conform Wound #3 Right,Midline,Anterior Lower Leg: ABD and  Kerlix/Conform Dressing Change Frequency: Wound #1 Left,Anterior Lower Leg: Change dressing every other day. Wound #3 Right,Midline,Anterior Lower Leg: Change dressing every other day. Follow-up Appointments: Wound #1 Left,Anterior Lower Leg: Return Appointment in 1 week. Wound #3 Right,Midline,Anterior Lower Leg: Return Appointment in 1 week. Edema Control: Wound #1 Left,Anterior Lower Leg: Tubigrip Wound #3 Right,Midline,Anterior Lower Leg: Tubigrip [place], Dalina L. ([id number]) I have recommended dressing with silver alginate to all the wounds and we will continue to use a light dressing in place to keep the gauze and position. She will come back and see me next week. Electronic Signature(s) Signed: 04/22/2015 12:07:47 PM By: Leah Kanner MD, FACS Entered By: [name] on 04/22/2015 12:07:47 [place], [name] ([id number]) -------------------------------------------------------------------------------- SuperBill Details Patient Name: [name] Date of Service: 04/22/2015 Medical Record Patient Account Number: [id number] Number: [name], [name], [name], [name]: Nov 17, 1912 (81 y.o. [place] Date of Birth/Sex: Female) Other Clinician: Primary Care Physician: [name] Treating Demarri Elie Referring Physician: Marisue Yu Physician/Extender: [name]: 7 Diagnosis Coding ICD-10 Codes Code Description [phone] Non-pressure chronic ulcer of right calf with fat layer exposed L97.222 Non-pressure chronic ulcer of left calf with fat layer exposed I89.0 Lymphedema, not elsewhere classified Facility Procedures CPT4 Code: [id number] Description: 99213 - WOUND CARE VISIT-LEV 3 EST PT Modifier: Quantity: 1 Physician Procedures CPT4 Code: 3664403 Description: 99213 - WC PHYS LEVEL 3 - EST PT ICD-10 Description Diagnosis L97.212 Non-pressure chronic ulcer of right calf with fat L97.222 Non-pressure chronic ulcer of left calf with fat I89.0 Lymphedema,  not elsewhere classified Modifier: layer exposed layer exposed Quantity: 1 Electronic Signature(s) Signed: 04/22/2015 2:58:33 PM By: Elpidio Eric BSN, [name] Previous Signature: 04/22/2015 12:40:14 PM Version By: Elpidio Eric BSN, [name] Previous Signature: 04/22/2015 2:53:21 PM Version By: Leah Kanner MD, FACS Previous Signature: 04/22/2015 12:08:02 PM Version By: Leah Kanner MD, FACS Entered By: Elpidio Eric on 04/22/2015 14:58:33

## 2015-04-22 NOTE — Progress Notes (Signed)
MAVERY, MILLING ([id number]) Visit Report for 04/22/2015 Arrival Information Details Patient Name: [name], [name] Date of Service: 04/22/2015 11:30 AM Medical Record Number: [id number] Patient Account Number: [id number] Date of Birth/Sex: 1912-12-27 (81 y.o. Female) Treating RN: [name], RN, BSN, [employer] Primary Care Physician: PATIENT, NO Other Clinician: Referring Physician: Marisue Ivan Treating Physician/Extender: Rudene Re in Treatment: 7 Visit Information History Since Last Visit Added or deleted any medications: No Patient Arrived: Wheel Chair Any new allergies or adverse reactions: No Arrival Time: 11:38 Had a fall or experienced change in No activities of daily living that may affect Accompanied By: caregiver risk of falls: Transfer Assistance: Manual Signs or symptoms of abuse/neglect since last No Patient Identification Verified: Yes visito Secondary Verification Process Yes Hospitalized since last visit: No Completed: Has Dressing in Place as Prescribed: Yes Patient Requires Transmission-Based No Pain Present Now: No Precautions: Patient Has Alerts: Yes Electronic Signature(s) Signed: 04/22/2015 11:38:38 AM By: Elpidio Eric BSN, RN Entered By: Elpidio Eric on 04/22/2015 11:38:38 Rede, [name] ([id number]) -------------------------------------------------------------------------------- Clinic Level of Care Assessment Details Patient Name: [name] Date of Service: 04/22/2015 11:30 AM Medical Record Number: [id number] Patient Account Number: [id number] Date of Birth/Sex: [birth year] (81 y.o. Female) Treating RN: [name], RN, BSN, Psychologist, clinical Primary Care Physician: PATIENT, NO Other Clinician: Referring Physician: Marisue Ivan Treating Physician/Extender: Rudene Re in Treatment: 7 Clinic Level of Care Assessment Items TOOL 4 Quantity Score []  - Use when only an EandM is performed on FOLLOW-UP visit 0 ASSESSMENTS - Nursing Assessment /  Reassessment X - Reassessment of Co-morbidities (includes updates in patient status) 1 10 X - Reassessment of Adherence to Treatment Plan 1 5 ASSESSMENTS - Wound and Skin Assessment / Reassessment []  - Simple Wound Assessment / Reassessment - one wound 0 X - Complex Wound Assessment / Reassessment - multiple wounds 2 5 []  - Dermatologic / Skin Assessment (not related to wound area) 0 ASSESSMENTS - Focused Assessment X - Circumferential Edema Measurements - multi extremities 2 5 []  - Nutritional Assessment / Counseling / Intervention 0 X - Lower Extremity Assessment (monofilament, tuning fork, pulses) 1 5 []  - Peripheral Arterial Disease Assessment (using hand held doppler) 0 ASSESSMENTS - Ostomy and/or Continence Assessment and Care []  - Incontinence Assessment and Management 0 []  - Ostomy Care Assessment and Management (repouching, etc.) 0 PROCESS - Coordination of Care X - Simple Patient / Family Education for ongoing care 1 15 []  - Complex (extensive) Patient / Family Education for ongoing care 0 []  - Staff obtains Chiropractor, Records, Test Results / Process Orders 0 []  - Staff telephones HHA, Nursing Homes / Clarify orders / etc 0 []  - Routine Transfer to another Facility (non-emergent condition) 0 Dell, Deetta L. ([id number]) []  - Routine Hospital Admission (non-emergent condition) 0 []  - New Admissions / Manufacturing engineer / Ordering NPWT, Apligraf, etc. 0 []  - Emergency Hospital Admission (emergent condition) 0 X - Simple Discharge Coordination 1 10 []  - Complex (extensive) Discharge Coordination 0 PROCESS - Special Needs []  - Pediatric / Minor Patient Management 0 []  - Isolation Patient Management 0 []  - Hearing / Language / Visual special needs 0 []  - Assessment of Community assistance (transportation, D/C planning, etc.) 0 []  - Additional assistance / Altered mentation 0 []  - Support Surface(s) Assessment (bed, cushion, seat, etc.) 0 INTERVENTIONS - Wound Cleansing /  Measurement []  - Simple Wound Cleansing - one wound 0 X - Complex Wound Cleansing - multiple wounds 2 5 X - Wound Imaging (photographs -  any number of wounds) 1 5 []  - Wound Tracing (instead of photographs) 0 []  - Simple Wound Measurement - one wound 0 X - Complex Wound Measurement - multiple wounds 2 5 INTERVENTIONS - Wound Dressings X - Small Wound Dressing one or multiple wounds 2 10 []  - Medium Wound Dressing one or multiple wounds 0 []  - Large Wound Dressing one or multiple wounds 0 []  - Application of Medications - topical 0 []  - Application of Medications - injection 0 INTERVENTIONS - Miscellaneous []  - External ear exam 0 Santoyo, Alba L. ([id number]) []  - Specimen Collection (cultures, biopsies, blood, body fluids, etc.) 0 []  - Specimen(s) / Culture(s) sent or taken to Lab for analysis 0 []  - Patient Transfer (multiple staff / Michiel Sites Lift / Similar devices) 0 []  - Simple Staple / Suture removal (25 or less) 0 []  - Complex Staple / Suture removal (26 or more) 0 []  - Hypo / Hyperglycemic Management (close monitor of Blood Glucose) 0 []  - Ankle / Brachial Index (ABI) - do not check if billed separately 0 X - Vital Signs 1 5 Has the patient been seen at the hospital within the last three years: Yes Total Score: 115 Level Of Care: New/Established - Level 3 Electronic Signature(s) Signed: 04/22/2015 12:39:58 PM By: Elpidio Eric BSN, RN Entered By: Elpidio Eric on 04/22/2015 12:39:58 Cosey, [name] ([id number]) -------------------------------------------------------------------------------- Encounter Discharge Information Details Patient Name: [name] Date of Service: 04/22/2015 11:30 AM Medical Record Number: [id number] Patient Account Number: [id number] Date of Birth/Sex: [birth year] (81 y.o. Female) Treating RN: Clover Mealy, RN, BSN, [employer] Primary Care Physician: PATIENT, NO Other Clinician: Referring Physician: Marisue Ivan Treating Physician/Extender: Rudene Re in Treatment: 7 Encounter Discharge Information Items Discharge Pain Level: 0 Discharge Condition: Stable Ambulatory Status: Wheelchair Discharge Destination: Nursing Home Transportation: Other Accompanied By: caregiver Schedule Follow-up Appointment: No Medication Reconciliation completed and provided to Patient/Care No Llewelyn Sheaffer: Provided on Clinical Summary of Care: 04/22/2015 Form Type Recipient Paper Patient NP Electronic Signature(s) Signed: 04/22/2015 1:06:17 PM By: Elpidio Eric BSN, RN Previous Signature: 04/22/2015 12:12:28 PM Version By: Gwenlyn Perking Entered By: Elpidio Eric on 04/22/2015 13:06:16 Anguiano, [name] ([id number]) -------------------------------------------------------------------------------- Lower Extremity Assessment Details Patient Name: [name] Date of Service: 04/22/2015 11:30 AM Medical Record Number: [id number] Patient Account Number: [id number] Date of Birth/Sex: 1912-12-29 (81 y.o. Female) Treating RN: [name], RN, BSN, Psychologist, clinical Primary Care Physician: PATIENT, NO Other Clinician: Referring Physician: Marisue Ivan Treating Physician/Extender: Rudene Re in Treatment: 7 Edema Assessment Assessed: [Left: No] [Right: No] Edema: [Left: Yes] [Right: Yes] Calf Left: Right: Point of Measurement: 35 cm From Medial Instep 34.6 cm 34.6 cm Ankle Left: Right: Point of Measurement: 8 cm From Medial Instep 23.3 cm 23.8 cm Vascular Assessment Pulses: Posterior Tibial Dorsalis Pedis Palpable: [Left:Yes] [Right:Yes] Extremity colors, hair growth, and conditions: Extremity Color: [Left:Mottled] [Right:Mottled] Hair Growth on Extremity: [Left:No] [Right:No] Temperature of Extremity: [Left:Warm] [Right:Warm] Capillary Refill: [Left:< 3 seconds] [Right:< 3 seconds] Dependent Rubor: [Left:No] [Right:No] Blanched when Elevated: [Left:No] [Right:No] Lipodermatosclerosis: [Left:No] [Right:No] Toe Nail Assessment Left:  Right: Thick: Yes Yes Discolored: Yes Yes Deformed: No No Improper Length and Hygiene: No No Electronic Signature(s) Signed: 04/22/2015 11:48:11 AM By: Elpidio Eric BSN, RN Rogowski, [name] ([id number]) Entered By: Elpidio Eric on 04/22/2015 11:48:11 Ramcharan, [name] ([id number]) -------------------------------------------------------------------------------- Multi Wound Chart Details Patient Name: [name] Date of Service: 04/22/2015 11:30 AM Medical Record Number: [id number] Patient Account Number: [id number] Date of Birth/Sex: Aug 26, 1913 (81 y.o. Female) Treating  RN: Clover Mealy, RN, BSN, [employer] Primary Care Physician: PATIENT, NO Other Clinician: Referring Physician: Marisue Ivan Treating Physician/Extender: Rudene Re in Treatment: 7 Vital Signs Height(in): 64 Pulse(bpm): 66 Weight(lbs): 147 Blood Pressure 143/52 (mmHg): Body Mass Index(BMI): 25 Temperature(F): 97.9 Respiratory Rate 17 (breaths/min): Photos: [1:No Photos] [2:No Photos] [3:No Photos] Wound Location: [1:Left Lower Leg - Anterior Right Lower Leg - Anterior Right Lower Leg - Midline,] [3:Anterior] Wounding Event: [1:Trauma] [2:Trauma] [3:Blister] Primary Etiology: [1:Skin Tear] [2:Skin Tear] [3:Lymphedema] Comorbid History: [1:Hypertension, Osteoarthritis] [2:Hypertension, Osteoarthritis] [3:Hypertension, Osteoarthritis] Date Acquired: [1:01/31/2015] [2:01/31/2015] [3:03/14/2015] Weeks of Treatment: [1:7] [2:7] [3:5] Wound Status: [1:Open] [2:Healed - Epithelialized] [3:Open] Measurements L x W x D 8x3x0.4 [2:0x0x0] [3:3.1x1.8x0.2] (cm) Area (cm) : [1:18.85] [2:0] [3:4.383] Volume (cm) : [1:7.54] [2:0] [3:0.877] % Reduction in Area: [1:47.40%] [2:100.00%] [3:7.00%] % Reduction in Volume: 29.80% [2:100.00%] [3:-86.20%] Classification: [1:Full Thickness Without Exposed Support Structures] [2:Full Thickness Without Exposed Support Structures] [3:Partial Thickness] Exudate Amount: [1:Medium]  [2:None Present] [3:Small] Exudate Type: [1:Serosanguineous] [2:N/A] [3:Serosanguineous] Exudate Color: [1:red, brown] [2:N/A] [3:red, brown] Wound Margin: [1:Distinct, outline attached Distinct, outline attached Indistinct, nonvisible] Granulation Amount: [1:Medium (34-66%)] [2:Medium (34-66%)] [3:Medium (34-66%)] Granulation Quality: [1:Pink, Pale] [2:Red] [3:Pink, Pale] Necrotic Amount: [1:Medium (34-66%)] [2:None Present (0%)] [3:Small (1-33%)] Necrotic Tissue: [1:Eschar, Adherent Slough N/A] [3:Adherent Slough] Exposed Structures: [1:Fascia: No Fat: No Tendon: No] [2:Fascia: No Fat: No Tendon: No] [3:Fascia: No Fat: No Tendon: No] Muscle: No Muscle: No Muscle: No Joint: No Joint: No Joint: No Bone: No Bone: No Bone: No Limited to Skin Limited to Skin Limited to Skin Breakdown Breakdown Breakdown Epithelialization: Small (1-33%) Large (67-100%) Large (67-100%) Periwound Skin Texture: Edema: Yes Edema: Yes Edema: Yes Excoriation: No Excoriation: No Excoriation: No Induration: No Induration: No Induration: No Callus: No Callus: No Callus: No Crepitus: No Crepitus: No Crepitus: No Fluctuance: No Fluctuance: No Fluctuance: No Friable: No Friable: No Friable: No Rash: No Rash: No Rash: No Scarring: No Scarring: No Scarring: No Periwound Skin Moist: Yes Dry/Scaly: Yes Moist: Yes Moisture: Maceration: No Maceration: No Maceration: No Dry/Scaly: No Moist: No Dry/Scaly: No Periwound Skin Color: Atrophie Blanche: No Atrophie Blanche: No Atrophie Blanche: No Cyanosis: No Cyanosis: No Cyanosis: No Ecchymosis: No Ecchymosis: No Ecchymosis: No Erythema: No Erythema: No Erythema: No Hemosiderin Staining: No Hemosiderin Staining: No Hemosiderin Staining: No Mottled: No Mottled: No Mottled: No Pallor: No Pallor: No Pallor: No Rubor: No Rubor: No Rubor: No Temperature: No Abnormality No Abnormality No Abnormality Tenderness on Yes No  Yes Palpation: Wound Preparation: Ulcer Cleansing: Ulcer Cleansing: Ulcer Cleansing: Rinsed/Irrigated with Rinsed/Irrigated with Rinsed/Irrigated with Saline Saline Saline Topical Anesthetic Topical Anesthetic Applied: Other: Lidocaine Applied: None 4% Treatment Notes Electronic Signature(s) Signed: 04/22/2015 11:57:22 AM By: Elpidio Eric BSN, RN Entered By: Elpidio Eric on 04/22/2015 11:57:21 Redmon, [name] ([id number]) -------------------------------------------------------------------------------- Multi-Disciplinary Care Plan Details Patient Name: [name] Date of Service: 04/22/2015 11:30 AM Medical Record Number: [id number] Patient Account Number: [id number] Date of Birth/Sex: 1912/11/18 (81 y.o. Female) Treating RN: [name], RN, BSN, [employer] Primary Care Physician: PATIENT, NO Other Clinician: Referring Physician: Marisue Ivan Treating Physician/Extender: Rudene Re in Treatment: 7 Active Inactive Abuse / Safety / Falls / Self Care Management Nursing Diagnoses: Impaired home maintenance Impaired physical mobility Potential for falls Self care deficit: actual or potential Goals: Patient will remain injury free Date Initiated: 03/03/2015 Goal Status: Active Patient/caregiver will verbalize understanding of skin care regimen Date Initiated: 03/03/2015 Goal Status: Active Patient/caregiver will verbalize/demonstrate measure taken to improve self care Date Initiated: 03/03/2015  Goal Status: Active Patient/caregiver will verbalize/demonstrate measures taken to improve the patient's personal safety Date Initiated: 03/03/2015 Goal Status: Active Patient/caregiver will verbalize/demonstrate measures taken to prevent injury and/or falls Date Initiated: 03/03/2015 Goal Status: Active Patient/caregiver will verbalize/demonstrate understanding of what to do in case of emergency Date Initiated: 03/03/2015 Goal Status: Active Interventions: Assess: immobility, friction,  shearing, incontinence upon admission and as needed Assess impairment of mobility on admission and as needed per policy Provide education on basic hygiene Provide education on personal and home safety Provide education on safe transfers HYDIE, LANGAN ([id number]) Treatment Activities: Education provided on Basic Hygiene : 03/10/2015 Notes: Orientation to the Wound Care Program Nursing Diagnoses: Knowledge deficit related to the wound healing center program Goals: Patient/caregiver will verbalize understanding of the Wound Healing Center Program Date Initiated: 03/03/2015 Goal Status: Active Interventions: Provide education on orientation to the wound center Notes: Venous Leg Ulcer Nursing Diagnoses: Knowledge deficit related to disease process and management Potential for venous Insuffiency (use before diagnosis confirmed) Goals: Patient will maintain optimal edema control Date Initiated: 03/03/2015 Goal Status: Active Patient/caregiver will verbalize understanding of disease process and disease management Date Initiated: 03/03/2015 Goal Status: Active Verify adequate tissue perfusion prior to therapeutic compression application Date Initiated: 03/03/2015 Goal Status: Active Interventions: Assess peripheral edema status every visit. Compression as ordered Provide education on venous insufficiency Treatment Activities: Test ordered outside of clinic : 04/22/2015 Notes: RUCHAMA, KUBICEK ([id number]) Wound/Skin Impairment Nursing Diagnoses: Impaired tissue integrity Knowledge deficit related to ulceration/compromised skin integrity Goals: Patient/caregiver will verbalize understanding of skin care regimen Date Initiated: 03/03/2015 Goal Status: Active Ulcer/skin breakdown will have a volume reduction of 30% by week 4 Date Initiated: 03/03/2015 Goal Status: Active Ulcer/skin breakdown will have a volume reduction of 50% by week 8 Date Initiated: 03/03/2015 Goal Status:  Active Ulcer/skin breakdown will have a volume reduction of 80% by week 12 Date Initiated: 03/03/2015 Goal Status: Active Ulcer/skin breakdown will heal within 14 weeks Date Initiated: 03/03/2015 Goal Status: Active Interventions: Assess patient/caregiver ability to perform ulcer/skin care regimen upon admission and as needed Assess ulceration(s) every visit Provide education on ulcer and skin care Notes: Electronic Signature(s) Signed: 04/22/2015 11:57:09 AM By: Elpidio Eric BSN, RN Entered By: Elpidio Eric on 04/22/2015 11:57:08 Mirsky, [name] ([id number]) -------------------------------------------------------------------------------- Pain Assessment Details Patient Name: [name] Date of Service: 04/22/2015 11:30 AM Medical Record Number: [id number] Patient Account Number: [id number] Date of Birth/Sex: 03/23/1913 (81 y.o. Female) Treating RN: Clover Mealy, RN, BSN, [employer] Primary Care Physician: PATIENT, NO Other Clinician: Referring Physician: Marisue Ivan Treating Physician/Extender: Rudene Re in Treatment: 7 Active Problems Location of Pain Severity and Description of Pain Patient Has Paino No Site Locations Pain Management and Medication Current Pain Management: Electronic Signature(s) Signed: 04/22/2015 11:38:44 AM By: Elpidio Eric BSN, RN Entered By: Elpidio Eric on 04/22/2015 11:38:44 Boutelle, [name] ([id number]) -------------------------------------------------------------------------------- Patient/Caregiver Education Details Patient Name: [name] Date of Service: 04/22/2015 11:30 AM Medical Record Number: [id number] Patient Account Number: [id number] Date of Birth/Gender: 1912-11-18 (81 y.o. Female) Treating RN: [name], RN, BSN, [employer] Primary Care Physician: PATIENT, NO Other Clinician: Referring Physician: Marisue Ivan Treating Physician/Extender: Rudene Re in Treatment: 7 Education Assessment Education Provided To: Patient  and Caregiver Education Topics Provided Basic Hygiene: Methods: Explain/Verbal Responses: State content correctly Safety: Methods: Explain/Verbal Responses: State content correctly Venous: Methods: Explain/Verbal Responses: State content correctly Welcome To The [facility]: Methods: Explain/Verbal Responses: State content correctly Wound/Skin Impairment: Methods: Explain/Verbal Responses: State content correctly  Electronic Signature(s) Signed: 04/22/2015 1:06:46 PM By: Elpidio Eric BSN, RN Entered By: Elpidio Eric on 04/22/2015 13:06:46 Mcloud, [name] ([id number]) -------------------------------------------------------------------------------- Wound Assessment Details Patient Name: [name] Date of Service: 04/22/2015 11:30 AM Medical Record Number: [id number] Patient Account Number: [id number] Date of Birth/Sex: [birth year] (81 y.o. Female) Treating RN: [name], RN, BSN, Psychologist, clinical Primary Care Physician: PATIENT, NO Other Clinician: Referring Physician: Marisue Ivan Treating Physician/Extender: Rudene Re in Treatment: 7 Wound Status Wound Number: 1 Primary Etiology: Skin Tear Wound Location: Left Lower Leg - Anterior Wound Status: Open Wounding Event: Trauma Comorbid History: Hypertension, Osteoarthritis Date Acquired: 01/31/2015 Weeks Of Treatment: 7 Clustered Wound: No Photos Photo Uploaded By: Elpidio Eric on 04/22/2015 15:09:46 Wound Measurements Length: (cm) 8 Width: (cm) 3 Depth: (cm) 0.4 Area: (cm) 18.85 Volume: (cm) 7.54 % Reduction in Area: 47.4% % Reduction in Volume: 29.8% Epithelialization: Small (1-33%) Tunneling: No Undermining: No Wound Description Full Thickness Without Exposed Classification: Support Structures Wound Margin: Distinct, outline attached Exudate Medium Amount: Exudate Type: Serosanguineous Exudate Color: red, brown Foul Odor After Cleansing: No Wound Bed Granulation Amount: Medium (34-66%) Exposed  Structure Granulation Quality: Pink, Pale Fascia Exposed: No Necrotic Amount: Medium (34-66%) Fat Layer Exposed: No Escalona, Mayela L. ([id number]) Necrotic Quality: Eschar, Adherent Slough Tendon Exposed: No Muscle Exposed: No Joint Exposed: No Bone Exposed: No Limited to Skin Breakdown Periwound Skin Texture Texture Color No Abnormalities Noted: No No Abnormalities Noted: No Callus: No Atrophie Blanche: No Crepitus: No Cyanosis: No Excoriation: No Ecchymosis: No Fluctuance: No Erythema: No Friable: No Hemosiderin Staining: No Induration: No Mottled: No Localized Edema: Yes Pallor: No Rash: No Rubor: No Scarring: No Temperature / Pain Moisture Temperature: No Abnormality No Abnormalities Noted: No Tenderness on Palpation: Yes Dry / Scaly: No Maceration: No Moist: Yes Wound Preparation Ulcer Cleansing: Rinsed/Irrigated with Saline Topical Anesthetic Applied: Other: Lidocaine 4%, Treatment Notes Wound #1 (Left, Anterior Lower Leg) 1. Cleansed with: Clean wound with Normal Saline 4. Dressing Applied: Aquacel Ag 5. Secondary Dressing Applied Guaze, ABD and kerlix/Conform 7. Secured with Tape Tubigrip Electronic Signature(s) Signed: 04/22/2015 11:52:17 AM By: Elpidio Eric BSN, RN Entered By: Elpidio Eric on 04/22/2015 11:52:17 Droege, [name] ([id number]) -------------------------------------------------------------------------------- Wound Assessment Details Patient Name: [name] Date of Service: 04/22/2015 11:30 AM Medical Record Number: [id number] Patient Account Number: [id number] Date of Birth/Sex: 05-08-1913 (81 y.o. Female) Treating RN: [name], RN, BSN, Psychologist, clinical Primary Care Physician: PATIENT, NO Other Clinician: Referring Physician: Marisue Ivan Treating Physician/Extender: Rudene Re in Treatment: 7 Wound Status Wound Number: 2 Primary Etiology: Skin Tear Wound Location: Right Lower Leg - Anterior Wound Status: Healed -  Epithelialized Wounding Event: Trauma Comorbid History: Hypertension, Osteoarthritis Date Acquired: 01/31/2015 Weeks Of Treatment: 7 Clustered Wound: No Photos Photo Uploaded By: Elpidio Eric on 04/22/2015 15:09:47 Wound Measurements Length: (cm) 0 % Reduction Width: (cm) 0 % Reduction Depth: (cm) 0 Epithelializ Area: (cm) 0 Tunneling: Volume: (cm) 0 Undermining in Area: 100% in Volume: 100% ation: Large (67-100%) No : No Wound Description Full Thickness Without Exposed Classification: Support Structures Wound Margin: Distinct, outline attached Exudate None Present Amount: Foul Odor After Cleansing: No Wound Bed Granulation Amount: Medium (34-66%) Exposed Structure Granulation Quality: Red Fascia Exposed: No Necrotic Amount: None Present (0%) Fat Layer Exposed: No Tendon Exposed: No Muscle Exposed: No Davisson, Yulisa L. ([id number]) Joint Exposed: No Bone Exposed: No Limited to Skin Breakdown Periwound Skin Texture Texture Color No Abnormalities Noted: No No Abnormalities Noted: No Callus: No Atrophie Blanche: No Crepitus:  No Cyanosis: No Excoriation: No Ecchymosis: No Fluctuance: No Erythema: No Friable: No Hemosiderin Staining: No Induration: No Mottled: No Localized Edema: Yes Pallor: No Rash: No Rubor: No Scarring: No Temperature / Pain Moisture Temperature: No Abnormality No Abnormalities Noted: No Dry / Scaly: Yes Maceration: No Moist: No Wound Preparation Ulcer Cleansing: Rinsed/Irrigated with Saline Electronic Signature(s) Signed: 04/22/2015 11:54:27 AM By: Elpidio Eric BSN, RN Previous Signature: 04/22/2015 11:53:19 AM Version By: Elpidio Eric BSN, RN Entered By: Elpidio Eric on 04/22/2015 11:54:27 Dedrick, [name] ([id number]) -------------------------------------------------------------------------------- Wound Assessment Details Patient Name: [name] Date of Service: 04/22/2015 11:30 AM Medical Record Number: [id number] Patient  Account Number: [id number] Date of Birth/Sex: 08-08-1913 (81 y.o. Female) Treating RN: [name], RN, BSN, Psychologist, clinical Primary Care Physician: PATIENT, NO Other Clinician: Referring Physician: Marisue Ivan Treating Physician/Extender: Rudene Re in Treatment: 7 Wound Status Wound Number: 3 Primary Etiology: Lymphedema Wound Location: Right Lower Leg - Midline, Wound Status: Open Anterior Comorbid History: Hypertension, Osteoarthritis Wounding Event: Blister Date Acquired: 03/14/2015 Weeks Of Treatment: 5 Clustered Wound: No Photos Photo Uploaded By: Elpidio Eric on 04/22/2015 15:10:15 Wound Measurements Length: (cm) 3.1 Width: (cm) 1.8 Depth: (cm) 0.2 Area: (cm) 4.383 Volume: (cm) 0.877 % Reduction in Area: 7% % Reduction in Volume: -86.2% Epithelialization: Large (67-100%) Tunneling: No Undermining: No Wound Description Classification: Partial Thickness Wound Margin: Indistinct, nonvisible Exudate Amount: Small Exudate Type: Serosanguineous Exudate Color: red, brown Foul Odor After Cleansing: No Wound Bed Granulation Amount: Medium (34-66%) Exposed Structure Granulation Quality: Pink, Pale Fascia Exposed: No Necrotic Amount: Small (1-33%) Fat Layer Exposed: No Raburn, Kala L. ([id number]) Necrotic Quality: Adherent Slough Tendon Exposed: No Muscle Exposed: No Joint Exposed: No Bone Exposed: No Limited to Skin Breakdown Periwound Skin Texture Texture Color No Abnormalities Noted: No No Abnormalities Noted: No Callus: No Atrophie Blanche: No Crepitus: No Cyanosis: No Excoriation: No Ecchymosis: No Fluctuance: No Erythema: No Friable: No Hemosiderin Staining: No Induration: No Mottled: No Localized Edema: Yes Pallor: No Rash: No Rubor: No Scarring: No Temperature / Pain Moisture Temperature: No Abnormality No Abnormalities Noted: No Tenderness on Palpation: Yes Dry / Scaly: No Maceration: No Moist: Yes Wound Preparation Ulcer  Cleansing: Rinsed/Irrigated with Saline Topical Anesthetic Applied: None Treatment Notes Wound #3 (Right, Midline, Anterior Lower Leg) 1. Cleansed with: Clean wound with Normal Saline 4. Dressing Applied: Aquacel Ag 5. Secondary Dressing Applied Guaze, ABD and kerlix/Conform 7. Secured with Tape Tubigrip Electronic Signature(s) Signed: 04/22/2015 11:53:31 AM By: Elpidio Eric BSN, RN Entered By: Elpidio Eric on 04/22/2015 11:53:31 Crowell, [name] ([id number]) -------------------------------------------------------------------------------- Vitals Details Patient Name: [name] Date of Service: 04/22/2015 11:30 AM Medical Record Number: [id number] Patient Account Number: [id number] Date of Birth/Sex: 11-Mar-1913 (81 y.o. Female) Treating RN: [name], RN, BSN, Rita Primary Care Physician: PATIENT, NO Other Clinician: Referring Physician: Marisue Ivan Treating Physician/Extender: Rudene Re in Treatment: 7 Vital Signs Time Taken: 11:40 Temperature (F): 97.9 Height (in): 64 Pulse (bpm): 66 Weight (lbs): 147 Respiratory Rate (breaths/min): 17 Body Mass Index (BMI): 25.2 Blood Pressure (mmHg): 143/52 Reference Range: 80 - 120 mg / dl Electronic Signature(s) Signed: 04/22/2015 11:42:12 AM By: Elpidio Eric BSN, RN Entered By: Elpidio Eric on 04/22/2015 11:42:12

## 2015-04-28 ENCOUNTER — Ambulatory Visit: Payer: Medicare Other | Admitting: Surgery

## 2015-04-29 ENCOUNTER — Encounter: Payer: Medicare Other | Attending: Surgery | Admitting: Surgery

## 2015-04-29 DIAGNOSIS — L97511 Non-pressure chronic ulcer of other part of right foot limited to breakdown of skin: Secondary | ICD-10-CM | POA: Diagnosis not present

## 2015-04-29 DIAGNOSIS — I10 Essential (primary) hypertension: Secondary | ICD-10-CM | POA: Insufficient documentation

## 2015-04-29 DIAGNOSIS — L97212 Non-pressure chronic ulcer of right calf with fat layer exposed: Secondary | ICD-10-CM | POA: Insufficient documentation

## 2015-04-29 DIAGNOSIS — L97222 Non-pressure chronic ulcer of left calf with fat layer exposed: Secondary | ICD-10-CM | POA: Diagnosis not present

## 2015-04-29 DIAGNOSIS — M1990 Unspecified osteoarthritis, unspecified site: Secondary | ICD-10-CM | POA: Diagnosis not present

## 2015-04-29 DIAGNOSIS — I890 Lymphedema, not elsewhere classified: Secondary | ICD-10-CM | POA: Insufficient documentation

## 2015-04-30 NOTE — Progress Notes (Signed)
ADELL, KOVAL ([id number]) Visit Report for 04/29/2015 Chief Complaint Document Details Patient Name: [name], [name] Date of Service: 04/29/2015 3:30 PM Medical Record Number: [id number] Patient Account Number: [id number] Date of Birth/Sex: Jan 26, 1913 (81 y.o. Female) Treating RN: Primary Care Physician: PATIENT, NO Other Clinician: Referring Physician: Marisue Ivan Treating Physician/Extender: Rudene Re in Treatment: 8 Information Obtained from: Patient Chief Complaint Patient seen for complaints of Non-Healing Wound. pleasant 81 year old patient who had a fall and injured both lower extremities on 01/31/2015 Electronic Signature(s) Signed: 04/29/2015 4:17:29 PM By: Evlyn Kanner MD, FACS Entered By: Evlyn Kanner on 04/29/2015 16:17:29 Nield, [name] ([id number]) -------------------------------------------------------------------------------- HPI Details Patient Name: [name] Date of Service: 04/29/2015 3:30 PM Medical Record Number: [id number] Patient Account Number: [id number] Date of Birth/Sex: September 05, 1912 (81 y.o. Female) Treating RN: Primary Care Physician: PATIENT, NO Other Clinician: Referring Physician: Marisue Ivan Treating Physician/Extender: Rudene Re in Treatment: 8 History of Present Illness Location: both lower extremities Quality: Patient reports experiencing a dull pain to affected area(s). Severity: Patient states wound (s) are getting better. Duration: Patient has had the wound for < 4 weeks prior to presenting for treatment Timing: Pain in wound is Intermittent (comes and goes Context: The wound occurred when the patient had a fall and lacerated both lower extremities. Modifying Factors: Other treatment(s) tried include:she had sutures applied there for a while and when they were removed the wound opened up. Associated Signs and Symptoms: Patient reports having difficulty standing for long periods. HPI Description:  81 year old female who fell and had an injury to her right forehead, right elbow and both shins on 01/31/2015. She went to the ER at [facility] and had been treated appropriately with sutures being placed to her right and left lower extremity. Also had a CT scan which showed no intracranial hemorrhage and only a large scalp hematoma on the right frontal region.she was put on Keflex for 5 days. she had also received Bactrim for 14 days.Since then she has been seen by her PCP and he had put her on Augmentin for 14 days and wound culture was obtained. culture reports were reviewed -- she grew an MRSA sensitive to tetracycline, Bactrim and vancomycin. Her past medical history significant for osteoporosis, hypertension, spinal stenosis, right hip replacement, appendectomy and abdomen hysterectomy for ovarian cancer. She has been seen in the past by the vascular surgery group at [facility] and she has an appointment to see them again. She is known to use lymphedema pumps while she has been in her assisted living home but has not been using them for the last month. She does also have compression stockings which she uses intermittently. 03/17/2015 -- She was seen by Dr. Gilda Crease on 03/03/2015 -- he reviewed the patientos case and recommended no surgical intervention. He has recommended compression stockings of the 20-30 mmHg variety and also recommended lymphedema pumps. He will see her back in 3 months. The patient has developed some blisters on both lower extremities and this may be due to the fact that she's had a Kerlix gauze wrap on both lower extremities and is also using the lymphedema pumps. 05/09/2015 -- she has a new wound on the dorsum of the right foot just proximal to the toes but this was probably a blister which opened out and is very superficial. Electronic Signature(s) Signed: 04/29/2015 4:17:53 PM By: Evlyn Kanner MD, FACS Entered By: Evlyn Kanner on 04/29/2015  16:17:53 Entwistle, [name] ([id number]) -------------------------------------------------------------------------------- Physical Exam Details Patient Name: [name]  L. Date of Service: 04/29/2015 3:30 PM Medical Record Number: [id number] Patient Account Number: [id number] Date of Birth/Sex: June 24, 1913 (81 y.o. Female) Treating RN: Primary Care Physician: PATIENT, NO Other Clinician: Referring Physician: Marisue Ivan Treating Physician/Extender: Rudene Re in Treatment: 8 Constitutional . Pulse regular. Respirations normal and unlabored. Afebrile. . Eyes Nonicteric. Reactive to light. Ears, Nose, Mouth, and Throat Lips, teeth, and gums WNL.Marland Kitchen Moist mucosa without lesions . Neck supple and nontender. No palpable supraclavicular or cervical adenopathy. Normal sized without goiter. Respiratory WNL. No retractions.. Cardiovascular Pedal Pulses WNL. No clubbing, cyanosis or edema. Chest Breasts symmetical and no nipple discharge.. Breast tissue WNL, no masses, lumps, or tenderness.. Lymphatic No adneopathy. No adenopathy. No adenopathy. Musculoskeletal Adexa without tenderness or enlargement.. Digits and nails w/o clubbing, cyanosis, infection, petechiae, ischemia, or inflammatory conditions.. Integumentary (Hair, Skin) No suspicious lesions. No crepitus or fluctuance. No peri-wound warmth or erythema. No masses.Marland Kitchen Psychiatric Judgement and insight Intact.. No evidence of depression, anxiety, or agitation.. Notes The wounds are looking clean especially both the left and the right lower extremity. She has a small superficial breakdown of skin on the dorsum of the right foot just proximal to the toes. Electronic Signature(s) Signed: 04/29/2015 4:18:28 PM By: Evlyn Kanner MD, FACS Entered By: Evlyn Kanner on 04/29/2015 16:18:28 Levay, [name] ([id number]) -------------------------------------------------------------------------------- Physician Orders  Details Patient Name: [name] Date of Service: 04/29/2015 3:30 PM Medical Record Number: [id number] Patient Account Number: [id number] Date of Birth/Sex: Jan 19, 1913 (81 y.o. Female) Treating RN: Curtis Sites Primary Care Physician: PATIENT, NO Other Clinician: Referring Physician: Marisue Ivan Treating Physician/Extender: Rudene Re in Treatment: 8 Verbal / Phone Orders: Yes Clinician: Curtis Sites Read Back and Verified: Yes Diagnosis Coding Wound Cleansing Wound #1 Left,Anterior Lower Leg o Cleanse wound with mild soap and water o May Shower, gently pat wound dry prior to applying new dressing. Wound #3 Right,Midline,Anterior Lower Leg o Cleanse wound with mild soap and water o May Shower, gently pat wound dry prior to applying new dressing. Wound #5 Right,Dorsal Foot o Cleanse wound with mild soap and water o May Shower, gently pat wound dry prior to applying new dressing. Anesthetic Wound #1 Left,Anterior Lower Leg o Topical Lidocaine 4% cream applied to wound bed prior to debridement Wound #3 Right,Midline,Anterior Lower Leg o Topical Lidocaine 4% cream applied to wound bed prior to debridement Wound #5 Right,Dorsal Foot o Topical Lidocaine 4% cream applied to wound bed prior to debridement Primary Wound Dressing Wound #1 Left,Anterior Lower Leg o Aquacel Ag Wound #3 Right,Midline,Anterior Lower Leg o Aquacel Ag Wound #5 Right,Dorsal Foot o Prisma Ag Secondary Dressing Wound #1 Left,Anterior Lower Leg o ABD and Kerlix/Conform Taffe, Marquasha L. ([id number]) Wound #3 Right,Midline,Anterior Lower Leg o ABD and Kerlix/Conform Wound #5 Right,Dorsal Foot o ABD and Kerlix/Conform Dressing Change Frequency Wound #1 Left,Anterior Lower Leg o Change dressing every other day. Wound #3 Right,Midline,Anterior Lower Leg o Change dressing every other day. Wound #5 Right,Dorsal Foot o Change dressing every other  day. Follow-up Appointments Wound #1 Left,Anterior Lower Leg o Return Appointment in 1 week. Wound #3 Right,Midline,Anterior Lower Leg o Return Appointment in 1 week. Wound #5 Right,Dorsal Foot o Return Appointment in 1 week. Edema Control Wound #1 Left,Anterior Lower Leg o Tubigrip Wound #3 Right,Midline,Anterior Lower Leg o Tubigrip Wound #5 Right,Dorsal Foot o Tubigrip Electronic Signature(s) Signed: 04/29/2015 4:20:06 PM By: Evlyn Kanner MD, FACS Signed: 04/29/2015 5:09:34 PM By: Curtis Sites Entered By: Curtis Sites on 04/29/2015 16:14:14 Tetterton, Patrisia L. (  [id number]) -------------------------------------------------------------------------------- Problem List Details Patient Name: CHELCEY, CAPUTO Date of Service: 04/29/2015 3:30 PM Medical Record Number: [id number] Patient Account Number: [id number] Date of Birth/Sex: 08-03-1913 (81 y.o. 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/03/2015 Yes exposed L97.222 Non-pressure chronic ulcer of left calf with fat layer 03/03/2015 Yes exposed I89.0 Lymphedema, not elsewhere classified 03/03/2015 Yes L97.511 Non-pressure chronic ulcer of other part of right foot 04/29/2015 Yes limited to breakdown of skin Inactive Problems Resolved Problems Electronic Signature(s) Signed: 04/29/2015 4:17:21 PM By: Evlyn Kanner MD, FACS Entered By: Evlyn Kanner on 04/29/2015 16:17:21 Bisono, [name] ([id number]) -------------------------------------------------------------------------------- Progress Note Details Patient Name: [name] Date of Service: 04/29/2015 3:30 PM Medical Record Number: [id number] Patient Account Number: [id number] Date of Birth/Sex:  05-13-1913 (81 y.o. Female) Treating RN: Primary Care Physician: PATIENT, NO Other Clinician: Referring Physician: Marisue Ivan Treating Physician/Extender: Rudene Re in Treatment: 8 Subjective Chief Complaint Information obtained from Patient Patient seen for complaints of Non-Healing Wound. pleasant 81 year old patient who had a fall and injured both lower extremities on 01/31/2015 History of Present Illness (HPI) The following HPI elements were documented for the patient's wound: Location: both lower extremities Quality: Patient reports experiencing a dull pain to affected area(s). Severity: Patient states wound (s) are getting better. Duration: Patient has had the wound for < 4 weeks prior to presenting for treatment Timing: Pain in wound is Intermittent (comes and goes Context: The wound occurred when the patient had a fall and lacerated both lower extremities. Modifying Factors: Other treatment(s) tried include:she had sutures applied there for a while and when they were removed the wound opened up. Associated Signs and Symptoms: Patient reports having difficulty standing for long periods. 81 year old female who fell and had an injury to her right forehead, right elbow and both shins on 01/31/2015. She went to the ER at [facility] and had been treated appropriately with sutures being placed to her right and left lower extremity. Also had a CT scan which showed no intracranial hemorrhage and only a large scalp hematoma on the right frontal region.she was put on Keflex for 5 days. she had also received Bactrim for 14 days.Since then she has been seen by her PCP and he had put her on Augmentin for 14 days and wound culture was obtained. culture reports were reviewed -- she grew an MRSA sensitive to tetracycline, Bactrim and vancomycin. Her past medical history significant for osteoporosis, hypertension, spinal stenosis, right hip replacement, appendectomy and  abdomen hysterectomy for ovarian cancer. She has been seen in the past by the vascular surgery group at [facility] and she has an appointment to see them again. She is known to use lymphedema pumps while she has been in her assisted living home but has not been using them for the last month. She does also have compression stockings which she uses intermittently. 03/17/2015 -- She was seen by Dr. Gilda Crease on 03/03/2015 -- he reviewed the patient s case and recommended no surgical intervention. He has recommended compression stockings of the 20-30 mmHg variety and also recommended lymphedema pumps. He will see her back in 3 months. The patient has developed some blisters on both lower extremities and this may be due to the fact that she's had a Kerlix gauze wrap on both lower extremities and  is also using the lymphedema pumps. 05/09/2015 -- she has a new wound on the dorsum of the right foot just proximal to the toes but this was Acrey, Kenidi L. ([id number]) probably a blister which opened out and is very superficial. Objective Constitutional Pulse regular. Respirations normal and unlabored. Afebrile. Vitals Time Taken: 3:40 PM, Height: 64 in, Weight: 147 lbs, BMI: 25.2, Temperature: 97.5 F, Pulse: 72 bpm, Respiratory Rate: 16 breaths/min, Blood Pressure: 102/73 mmHg. Eyes Nonicteric. Reactive to light. Ears, Nose, Mouth, and Throat Lips, teeth, and gums WNL.Marland Kitchen Moist mucosa without lesions . Neck supple and nontender. No palpable supraclavicular or cervical adenopathy. Normal sized without goiter. Respiratory WNL. No retractions.. Cardiovascular Pedal Pulses WNL. No clubbing, cyanosis or edema. Chest Breasts symmetical and no nipple discharge.. Breast tissue WNL, no masses, lumps, or tenderness.. Lymphatic No adneopathy. No adenopathy. No adenopathy. Musculoskeletal Adexa without tenderness or enlargement.. Digits and nails w/o clubbing, cyanosis, infection,  petechiae, ischemia, or inflammatory conditions.Marland Kitchen Psychiatric Judgement and insight Intact.. No evidence of depression, anxiety, or agitation.. General Notes: The wounds are looking clean especially both the left and the right lower extremity. She has a small superficial breakdown of skin on the dorsum of the right foot just proximal to the toes. Integumentary (Hair, Skin) No suspicious lesions. No crepitus or fluctuance. No peri-wound warmth or erythema. No masses.Marland Kitchen Havlin, Janese L. ([id number]) Wound #1 status is Open. Original cause of wound was Trauma. The wound is located on the Left,Anterior Lower Leg. The wound measures 6cm length x 2.4cm width x 0.4cm depth; 11.31cm^2 area and 4.524cm^3 volume. The wound is limited to skin breakdown. There is no tunneling or undermining noted. There is a medium amount of serosanguineous drainage noted. The wound margin is distinct with the outline attached to the wound base. There is medium (34-66%) pink, pale granulation within the wound bed. There is a medium (34-66%) amount of necrotic tissue within the wound bed including Eschar and Adherent Slough. The periwound skin appearance exhibited: Localized Edema, Moist. The periwound skin appearance did not exhibit: Callus, Crepitus, Excoriation, Fluctuance, Friable, Induration, Rash, Scarring, Dry/Scaly, Maceration, Atrophie Blanche, Cyanosis, Ecchymosis, Hemosiderin Staining, Mottled, Pallor, Rubor, Erythema. Periwound temperature was noted as No Abnormality. The periwound has tenderness on palpation. Wound #3 status is Open. Original cause of wound was Blister. The wound is located on the Right,Midline,Anterior Lower Leg. The wound measures 2.4cm length x 1.7cm width x 0.2cm depth; 3.204cm^2 area and 0.641cm^3 volume. The wound is limited to skin breakdown. There is no tunneling or undermining noted. There is a small amount of serosanguineous drainage noted. The wound margin is indistinct and  nonvisible. There is medium (34-66%) pink, pale granulation within the wound bed. There is a small (1-33%) amount of necrotic tissue within the wound bed including Adherent Slough. The periwound skin appearance exhibited: Localized Edema, Moist. The periwound skin appearance did not exhibit: Callus, Crepitus, Excoriation, Fluctuance, Friable, Induration, Rash, Scarring, Dry/Scaly, Maceration, Atrophie Blanche, Cyanosis, Ecchymosis, Hemosiderin Staining, Mottled, Pallor, Rubor, Erythema. Periwound temperature was noted as No Abnormality. The periwound has tenderness on palpation. Wound #5 status is Open. Original cause of wound was Blister. The wound is located on the Right,Dorsal Foot. The wound measures 1.5cm length x 1.5cm width x 0.1cm depth; 1.767cm^2 area and 0.177cm^3 volume. The wound is limited to skin breakdown. There is no tunneling or undermining noted. There is a medium amount of serous drainage noted. The wound margin is flat and intact. There is large (67-100%) red granulation within the wound  bed. There is no necrotic tissue within the wound bed. The periwound skin appearance did not exhibit: Callus, Crepitus, Excoriation, Fluctuance, Friable, Induration, Localized Edema, Rash, Scarring, Dry/Scaly, Maceration, Moist, Atrophie Blanche, Cyanosis, Ecchymosis, Hemosiderin Staining, Mottled, Pallor, Rubor, Erythema. Periwound temperature was noted as No Abnormality. Assessment Active Problems ICD-10 L97.212 - Non-pressure chronic ulcer of right calf with fat layer exposed L97.222 - Non-pressure chronic ulcer of left calf with fat layer exposed I89.0 - Lymphedema, not elsewhere classified L97.511 - Non-pressure chronic ulcer of other part of right foot limited to breakdown of skin Taras, Lygia L. ([id number]) I have recommended using silver alginate on both the lower extremities and a piece of Prisma over the dorsum of the right foot. I have recommended she use her lymphedema pumps  only once a day as she is concerned that this is causing her trauma to the wounds. She will come back and see me next week. Plan Wound Cleansing: Wound #1 Left,Anterior Lower Leg: Cleanse wound with mild soap and water May Shower, gently pat wound dry prior to applying new dressing. Wound #3 Right,Midline,Anterior Lower Leg: Cleanse wound with mild soap and water May Shower, gently pat wound dry prior to applying new dressing. Wound #5 Right,Dorsal Foot: Cleanse wound with mild soap and water May Shower, gently pat wound dry prior to applying new dressing. Anesthetic: Wound #1 Left,Anterior Lower Leg: Topical Lidocaine 4% cream applied to wound bed prior to debridement Wound #3 Right,Midline,Anterior Lower Leg: Topical Lidocaine 4% cream applied to wound bed prior to debridement Wound #5 Right,Dorsal Foot: Topical Lidocaine 4% cream applied to wound bed prior to debridement Primary Wound Dressing: Wound #1 Left,Anterior Lower Leg: Aquacel Ag Wound #3 Right,Midline,Anterior Lower Leg: Aquacel Ag Wound #5 Right,Dorsal Foot: Prisma Ag Secondary Dressing: Wound #1 Left,Anterior Lower Leg: ABD and Kerlix/Conform Wound #3 Right,Midline,Anterior Lower Leg: ABD and Kerlix/Conform Wound #5 Right,Dorsal Foot: ABD and Kerlix/Conform Dressing Change Frequency: Wound #1 Left,Anterior Lower Leg: Change dressing every other day. Wound #3 Right,Midline,Anterior Lower Leg: Change dressing every other day. Wound #5 Right,Dorsal Foot: Change dressing every other day. Follow-up Appointments: Wound #1 Left,Anterior Lower Leg: Return Appointment in 1 week. Jasmer, Benicia L. ([id number]) Wound #3 Right,Midline,Anterior Lower Leg: Return Appointment in 1 week. Wound #5 Right,Dorsal Foot: Return Appointment in 1 week. Edema Control: Wound #1 Left,Anterior Lower Leg: Tubigrip Wound #3 Right,Midline,Anterior Lower Leg: Tubigrip Wound #5 Right,Dorsal Foot: Tubigrip I have recommended using  silver alginate on both the lower extremities and a piece of Prisma over the dorsum of the right foot. I have recommended she use her lymphedema pumps only once a day as she is concerned that this is causing her trauma to the wounds. She will come back and see me next week. Electronic Signature(s) Signed: 04/29/2015 4:19:22 PM By: Evlyn Kanner MD, FACS Entered By: Evlyn Kanner on 04/29/2015 16:19:21 Carrithers, [name] ([id number]) -------------------------------------------------------------------------------- SuperBill Details Patient Name: [name] Date of Service: 04/29/2015 Medical Record Number: [id number] Patient Account Number: [id number] Date of Birth/Sex: 02-Dec-1912 (81 y.o. Female) Treating RN: Primary Care Physician: PATIENT, NO Other Clinician: Referring Physician: Marisue Ivan Treating Physician/Extender: Rudene Re in Treatment: 8 Diagnosis Coding ICD-10 Codes Code Description [phone] Non-pressure chronic ulcer of right calf with fat layer exposed L97.222 Non-pressure chronic ulcer of left calf with fat layer exposed I89.0 Lymphedema, not elsewhere classified L97.511 Non-pressure chronic ulcer of other part of right foot limited to breakdown of skin Facility Procedures CPT4 Code: [id number] Description: 99214 - WOUND CARE VISIT-LEV 4  EST PT Modifier: Quantity: 1 Physician Procedures CPT4: Description Modifier Quantity Code [id number] 99213 - WC PHYS LEVEL 3 - EST PT 1 ICD-10 Description Diagnosis L97.212 Non-pressure chronic ulcer of right calf with fat layer exposed L97.222 Non-pressure chronic ulcer of left calf with fat layer  exposed I89.0 Lymphedema, not elsewhere classified L97.511 Non-pressure chronic ulcer of other part of right foot limited to breakdown of skin Electronic Signature(s) Signed: 04/29/2015 4:19:37 PM By: Evlyn Kanner MD, FACS Entered By: Evlyn Kanner on 04/29/2015 16:19:37

## 2015-04-30 NOTE — Progress Notes (Signed)
SHAUNTE, [name] ([id number]) Visit Report for 04/29/2015 Arrival Information Details Patient Name: [name], [name] Date of Service: 04/29/2015 3:30 PM Medical Record Number: [id number] Patient Account Number: [id number] Date of Birth/Sex: 03/31/1913 (81 y.o. Female) Treating RN: [name] Primary Care Physician: PATIENT, NO Other Clinician: Referring Physician: Marisue [name] Treating Physician/Extender: [name] in Treatment: 8 Visit Information History Since Last Visit Added or deleted any medications: No Patient Arrived: Wheel Chair Any new allergies or adverse reactions: No Arrival Time: 15:38 Had a fall or experienced change in No activities of daily living that may affect Accompanied By: friend risk of falls: Transfer Assistance: Manual Signs or symptoms of abuse/neglect since last No Patient Identification Verified: Yes visito Secondary Verification Process Yes Hospitalized since last visit: No Completed: Pain Present Now: No Patient Requires Transmission-Based No Precautions: Patient Has Alerts: Yes Electronic Signature(s) Signed: 04/29/2015 5:09:34 PM By: [name] Entered By: [name] on 04/29/2015 15:40:24 [name], [name] ([id number]) -------------------------------------------------------------------------------- Clinic Level of Care Assessment Details Patient Name: [name] Date of Service: 04/29/2015 3:30 PM Medical Record Number: [id number] Patient Account Number: [id number] Date of Birth/Sex: 1913/04/25 (81 y.o. Female) Treating RN: [name] Primary Care Physician: PATIENT, NO Other Clinician: Referring Physician: Marisue [name] Treating Physician/Extender: [name] in Treatment: 8 Clinic Level of Care Assessment Items TOOL 4 Quantity Score []  - Use when only an EandM is performed on FOLLOW-UP visit 0 ASSESSMENTS - Nursing Assessment / Reassessment X - Reassessment of Co-morbidities (includes  updates in patient status) 1 10 X - Reassessment of Adherence to Treatment Plan 1 5 ASSESSMENTS - Wound and Skin Assessment / Reassessment []  - Simple Wound Assessment / Reassessment - one wound 0 X - Complex Wound Assessment / Reassessment - multiple wounds 3 5 []  - Dermatologic / Skin Assessment (not related to wound area) 0 ASSESSMENTS - Focused Assessment X - Circumferential Edema Measurements - multi extremities 1 5 []  - Nutritional Assessment / Counseling / Intervention 0 X - Lower Extremity Assessment (monofilament, tuning fork, pulses) 1 5 []  - Peripheral Arterial Disease Assessment (using hand held doppler) 0 ASSESSMENTS - Ostomy and/or Continence Assessment and Care []  - Incontinence Assessment and Management 0 []  - Ostomy Care Assessment and Management (repouching, etc.) 0 PROCESS - Coordination of Care X - Simple Patient / Family Education for ongoing care 1 15 []  - Complex (extensive) Patient / Family Education for ongoing care 0 []  - Staff obtains Chiropractor, Records, Test Results / Process Orders 0 []  - Staff telephones HHA, Nursing Homes / Clarify orders / etc 0 []  - Routine Transfer to another Facility (non-emergent condition) 0 [name], [name]. ([id number]) []  - Routine Hospital Admission (non-emergent condition) 0 []  - New Admissions / Manufacturing engineer / Ordering NPWT, Apligraf, etc. 0 []  - Emergency Hospital Admission (emergent condition) 0 X - Simple Discharge Coordination 1 10 []  - Complex (extensive) Discharge Coordination 0 PROCESS - Special Needs []  - Pediatric / Minor Patient Management 0 []  - Isolation Patient Management 0 []  - Hearing / Language / Visual special needs 0 []  - Assessment of Community assistance (transportation, D/C planning, etc.) 0 []  - Additional assistance / Altered mentation 0 []  - Support Surface(s) Assessment (bed, cushion, seat, etc.) 0 INTERVENTIONS - Wound Cleansing / Measurement []  - Simple Wound Cleansing - one wound 0 X -  Complex Wound Cleansing - multiple wounds 3 5 X - Wound Imaging (photographs - any number of wounds) 1 5 []  - Wound Tracing (instead of photographs)  0 []  - Simple Wound Measurement - one wound 0 X - Complex Wound Measurement - multiple wounds 3 5 INTERVENTIONS - Wound Dressings X - Small Wound Dressing one or multiple wounds 3 10 []  - Medium Wound Dressing one or multiple wounds 0 []  - Large Wound Dressing one or multiple wounds 0 []  - Application of Medications - topical 0 []  - Application of Medications - injection 0 INTERVENTIONS - Miscellaneous []  - External ear exam 0 [name], [name]. ([id number]) []  - Specimen Collection (cultures, biopsies, blood, body fluids, etc.) 0 []  - Specimen(s) / Culture(s) sent or taken to Lab for analysis 0 []  - Patient Transfer (multiple staff / Michiel [name] Lift / Similar devices) 0 []  - Simple Staple / Suture removal (25 or less) 0 []  - Complex Staple / Suture removal (26 or more) 0 []  - Hypo / Hyperglycemic Management (close monitor of Blood Glucose) 0 []  - Ankle / Brachial Index (ABI) - do not check if billed separately 0 X - Vital Signs 1 5 Has the patient been seen at the hospital within the last three years: Yes Total Score: 135 Level Of Care: New/Established - Level 4 Electronic Signature(s) Signed: 04/29/2015 5:09:34 PM By: [name] Entered By: [name] on 04/29/2015 16:14:54 [name], [name] ([id number]) -------------------------------------------------------------------------------- Encounter Discharge Information Details Patient Name: [name] Date of Service: 04/29/2015 3:30 PM Medical Record Number: [id number] Patient Account Number: [id number] Date of Birth/Sex: 1913/07/19 (81 y.o. Female) Treating RN: Primary Care Physician: PATIENT, NO Other Clinician: Referring Physician: Marisue [name] Treating Physician/Extender: [name] in Treatment: 8 Encounter Discharge Information Items Schedule Follow-up  Appointment: No Medication Reconciliation completed No and provided to Patient/Care [name]: Provided on Clinical Summary of Care: 04/29/2015 Form Type Recipient Paper Patient NP Electronic Signature(s) Signed: 04/29/2015 4:31:05 PM By: [name] Entered By: [name] on 04/29/2015 16:31:05 [name], [name] ([id number]) -------------------------------------------------------------------------------- Lower Extremity Assessment Details Patient Name: [name] Date of Service: 04/29/2015 3:30 PM Medical Record Number: [id number] Patient Account Number: [id number] Date of Birth/Sex: 1912-12-30 (81 y.o. Female) Treating RN: [name] Primary Care Physician: PATIENT, NO Other Clinician: Referring Physician: Marisue [name] Treating Physician/Extender: [name] in Treatment: 8 Edema Assessment Assessed: [Left: No] [Right: No] Edema: [Left: Yes] [Right: Yes] Calf Left: Right: Point of Measurement: 35 cm From Medial Instep 35 cm 34.9 cm Ankle Left: Right: Point of Measurement: 8 cm From Medial Instep 25.4 cm 26.5 cm Vascular Assessment Pulses: Posterior Tibial Dorsalis Pedis Palpable: [Left:Yes] [Right:Yes] Extremity colors, hair growth, and conditions: Extremity Color: [Left:Mottled] [Right:Mottled] Hair Growth on Extremity: [Left:No] [Right:No] Temperature of Extremity: [Left:Warm] [Right:Warm] Capillary Refill: [Left:< 3 seconds] [Right:< 3 seconds] Electronic Signature(s) Signed: 04/29/2015 5:09:34 PM By: [name] Entered By: [name] on 04/29/2015 15:52:52 [name], [name] ([id number]) -------------------------------------------------------------------------------- Multi Wound Chart Details Patient Name: [name] Date of Service: 04/29/2015 3:30 PM Medical Record Number: [id number] Patient Account Number: [id number] Date of Birth/Sex: 1913-07-04 (81 y.o. Female) Treating RN: [name] Primary Care Physician: PATIENT,  NO Other Clinician: Referring Physician: Marisue [name] Treating Physician/Extender: [name] in Treatment: 8 Vital Signs Height(in): 64 Pulse(bpm): 72 Weight(lbs): 147 Blood Pressure 102/73 (mmHg): Body Mass Index(BMI): 25 Temperature(F): 97.5 Respiratory Rate 16 (breaths/min): Photos: [1:No Photos] [3:No Photos] [5:No Photos] Wound Location: [1:Left Lower Leg - Anterior Right Lower Leg - Midline, Right Foot - Dorsal] [3:Anterior] Wounding Event: [1:Trauma] [3:Blister] [5:Blister] Primary Etiology: [1:Skin Tear] [3:Lymphedema] [5:To be determined] Comorbid History: [1:Hypertension, Osteoarthritis] [3:Hypertension, Osteoarthritis] [5:Hypertension, Osteoarthritis]  Date Acquired: [1:01/31/2015] [3:03/14/2015] [5:04/29/2015] Weeks of Treatment: [1:8] [3:6] [5:0] Wound Status: [1:Open] [3:Open] [5:Open] Measurements [name] x W x D 6x2.4x0.4 [3:2.4x1.7x0.2] [5:1.5x1.5x0.1] (cm) Area (cm) : [1:11.31] [3:3.204] [5:1.767] Volume (cm) : [1:4.524] [3:0.641] [5:0.177] % Reduction in Area: [1:68.40%] [3:32.00%] [5:0.00%] % Reduction in Volume: 57.90% [3:-36.10%] [5:0.00%] Classification: [1:Full Thickness Without Exposed Support Structures] [3:Partial Thickness] [5:Partial Thickness] Exudate Amount: [1:Medium] [3:Small] [5:Medium] Exudate Type: [1:Serosanguineous] [3:Serosanguineous] [5:Serous] Exudate Color: [1:red, brown] [3:red, brown] [5:amber] Wound Margin: [1:Distinct, outline attached Indistinct, nonvisible] [5:Flat and Intact] Granulation Amount: [1:Medium (34-66%)] [3:Medium (34-66%)] [5:Large (67-100%)] Granulation Quality: [1:Pink, Pale] [3:Pink, Pale] [5:Red] Necrotic Amount: [1:Medium (34-66%)] [3:Small (1-33%)] [5:None Present (0%)] Necrotic Tissue: [1:Eschar, Adherent Slough Adherent Slough] [5:N/A] Exposed Structures: [1:Fascia: No Fat: No Tendon: No] [3:Fascia: No Fat: No Tendon: No] [5:Fascia: No Fat: No Tendon: No] Muscle: No Muscle: No Muscle: No Joint:  No Joint: No Joint: No Bone: No Bone: No Bone: No Limited to Skin Limited to Skin Limited to Skin Breakdown Breakdown Breakdown Epithelialization: Small (1-33%) Large (67-100%) Small (1-33%) Periwound Skin Texture: Edema: Yes Edema: Yes Edema: No Excoriation: No Excoriation: No Excoriation: No Induration: No Induration: No Induration: No Callus: No Callus: No Callus: No Crepitus: No Crepitus: No Crepitus: No Fluctuance: No Fluctuance: No Fluctuance: No Friable: No Friable: No Friable: No Rash: No Rash: No Rash: No Scarring: No Scarring: No Scarring: No Periwound Skin Moist: Yes Moist: Yes Maceration: No Moisture: Maceration: No Maceration: No Moist: No Dry/Scaly: No Dry/Scaly: No Dry/Scaly: No Periwound Skin Color: Atrophie Blanche: No Atrophie Blanche: No Atrophie Blanche: No Cyanosis: No Cyanosis: No Cyanosis: No Ecchymosis: No Ecchymosis: No Ecchymosis: No Erythema: No Erythema: No Erythema: No Hemosiderin Staining: No Hemosiderin Staining: No Hemosiderin Staining: No Mottled: No Mottled: No Mottled: No Pallor: No Pallor: No Pallor: No Rubor: No Rubor: No Rubor: No Temperature: No Abnormality No Abnormality No Abnormality Tenderness on Yes Yes No Palpation: Wound Preparation: Ulcer Cleansing: Ulcer Cleansing: Ulcer Cleansing: Rinsed/Irrigated with Rinsed/Irrigated with Rinsed/Irrigated with Saline Saline Saline Topical Anesthetic Topical Anesthetic Topical Anesthetic Applied: Other: Lidocaine Applied: Other: lidocaine Applied: None 4% 4% Treatment Notes Electronic Signature(s) Signed: 04/29/2015 5:09:34 PM By: [name] Entered By: [name] on 04/29/2015 15:58:11 [name], [name] ([id number]) -------------------------------------------------------------------------------- Multi-Disciplinary Care Plan Details Patient Name: [name] Date of Service: 04/29/2015 3:30 PM Medical Record Number: [id number] Patient Account  Number: [id number] Date of Birth/Sex: [birth year] (81 y.o. Female) Treating RN: [name] Primary Care Physician: PATIENT, NO Other Clinician: Referring Physician: Marisue [name] Treating Physician/Extender: [name] in Treatment: 8 Active Inactive Abuse / Safety / Falls / Self Care Management Nursing Diagnoses: Impaired home maintenance Impaired physical mobility Potential for falls Self care deficit: actual or potential Goals: Patient will remain injury free Date Initiated: 03/03/2015 Goal Status: Active Patient/caregiver will verbalize understanding of skin care regimen Date Initiated: 03/03/2015 Goal Status: Active Patient/caregiver will verbalize/demonstrate measure taken to improve self care Date Initiated: 03/03/2015 Goal Status: Active Patient/caregiver will verbalize/demonstrate measures taken to improve the patient's personal safety Date Initiated: 03/03/2015 Goal Status: Active Patient/caregiver will verbalize/demonstrate measures taken to prevent injury and/or falls Date Initiated: 03/03/2015 Goal Status: Active Patient/caregiver will verbalize/demonstrate understanding of what to do in case of emergency Date Initiated: 03/03/2015 Goal Status: Active Interventions: Assess: immobility, friction, shearing, incontinence upon admission and as needed Assess impairment of mobility on admission and as needed per policy Provide education on basic hygiene Provide education on personal and home safety Provide education on safe transfers [name], Danah [name]. (  [id number]) Treatment Activities: Education provided on Basic Hygiene : 03/10/2015 Notes: Orientation to the Wound Care Program Nursing Diagnoses: Knowledge deficit related to the wound healing center program Goals: Patient/caregiver will verbalize understanding of the Wound Healing Center Program Date Initiated: 03/03/2015 Goal Status: Active Interventions: Provide education on orientation to the wound  center Notes: Venous Leg Ulcer Nursing Diagnoses: Knowledge deficit related to disease process and management Potential for venous Insuffiency (use before diagnosis confirmed) Goals: Patient will maintain optimal edema control Date Initiated: 03/03/2015 Goal Status: Active Patient/caregiver will verbalize understanding of disease process and disease management Date Initiated: 03/03/2015 Goal Status: Active Verify adequate tissue perfusion prior to therapeutic compression application Date Initiated: 03/03/2015 Goal Status: Active Interventions: Assess peripheral edema status every visit. Compression as ordered Provide education on venous insufficiency Treatment Activities: Test ordered outside of clinic : 04/29/2015 Notes: [name], [name] ([id number]) Wound/Skin Impairment Nursing Diagnoses: Impaired tissue integrity Knowledge deficit related to ulceration/compromised skin integrity Goals: Patient/caregiver will verbalize understanding of skin care regimen Date Initiated: 03/03/2015 Goal Status: Active Ulcer/skin breakdown will have a volume reduction of 30% by week 4 Date Initiated: 03/03/2015 Goal Status: Active Ulcer/skin breakdown will have a volume reduction of 50% by week 8 Date Initiated: 03/03/2015 Goal Status: Active Ulcer/skin breakdown will have a volume reduction of 80% by week 12 Date Initiated: 03/03/2015 Goal Status: Active Ulcer/skin breakdown will heal within 14 weeks Date Initiated: 03/03/2015 Goal Status: Active Interventions: Assess patient/caregiver ability to perform ulcer/skin care regimen upon admission and as needed Assess ulceration(s) every visit Provide education on ulcer and skin care Notes: Electronic Signature(s) Signed: 04/29/2015 5:09:34 PM By: [name] Entered By: [name] on 04/29/2015 15:58:04 Dapper, [name] ([id number]) -------------------------------------------------------------------------------- Wound Assessment Details Patient  Name: [name] Date of Service: 04/29/2015 3:30 PM Medical Record Number: [id number] Patient Account Number: [id number] Date of Birth/Sex: [birth year] (81 y.o. Female) Treating RN: [name] Primary Care Physician: PATIENT, NO Other Clinician: Referring Physician: Marisue [name] Treating Physician/Extender: [name] in Treatment: 8 Wound Status Wound Number: 1 Primary Etiology: Skin Tear Wound Location: Left Lower Leg - Anterior Wound Status: Open Wounding Event: Trauma Comorbid History: Hypertension, Osteoarthritis Date Acquired: 01/31/2015 Weeks Of Treatment: 8 Clustered Wound: No Photos Photo Uploaded By: [name] on 04/29/2015 17:07:59 Wound Measurements Length: (cm) 6 % Reduction in Width: (cm) 2.4 % Reduction in Depth: (cm) 0.4 Epithelializati Area: (cm) 11.31 Tunneling: Volume: (cm) 4.524 Undermining: Area: 68.4% Volume: 57.9% on: Small (1-33%) No No Wound Description Full Thickness Without Exposed Classification: Support Structures Wound Margin: Distinct, outline attached Exudate Medium Amount: Exudate Type: Serosanguineous Exudate Color: red, brown Foul Odor After Cleansing: No Wound Bed Granulation Amount: Medium (34-66%) Exposed Structure Granulation Quality: Pink, Pale Fascia Exposed: No Necrotic Amount: Medium (34-66%) Fat Layer Exposed: No [name], [name]. ([id number]) Necrotic Quality: Eschar, Adherent Slough Tendon Exposed: No Muscle Exposed: No Joint Exposed: No Bone Exposed: No Limited to Skin Breakdown Periwound Skin Texture Texture Color No Abnormalities Noted: No No Abnormalities Noted: No Callus: No Atrophie Blanche: No Crepitus: No Cyanosis: No Excoriation: No Ecchymosis: No Fluctuance: No Erythema: No Friable: No Hemosiderin Staining: No Induration: No Mottled: No Localized Edema: Yes Pallor: No Rash: No Rubor: No Scarring: No Temperature / Pain Moisture Temperature: No  Abnormality No Abnormalities Noted: No Tenderness on Palpation: Yes Dry / Scaly: No Maceration: No Moist: Yes Wound Preparation Ulcer Cleansing: Rinsed/Irrigated with Saline Topical Anesthetic Applied: Other: Lidocaine 4%, Electronic Signature(s) Signed: 04/29/2015 5:09:34 PM By: [name] Entered  By: [name] on 04/29/2015 15:56:42 Cwik, [name] ([id number]) -------------------------------------------------------------------------------- Wound Assessment Details Patient Name: CELISE, BAZAR Date of Service: 04/29/2015 3:30 PM Medical Record Number: [id number] Patient Account Number: [id number] Date of Birth/Sex: 11/16/1912 (81 y.o. Female) Treating RN: [name] Primary Care Physician: PATIENT, NO Other Clinician: Referring Physician: Marisue [name] Treating Physician/Extender: [name] in Treatment: 8 Wound Status Wound Number: 3 Primary Etiology: Lymphedema Wound Location: Right Lower Leg - Midline, Wound Status: Open Anterior Comorbid History: Hypertension, Osteoarthritis Wounding Event: Blister Date Acquired: 03/14/2015 Weeks Of Treatment: 6 Clustered Wound: No Photos Photo Uploaded By: [name] on 04/29/2015 17:08:24 Wound Measurements Length: (cm) 2.4 % Reduction in Width: (cm) 1.7 % Reduction in Depth: (cm) 0.2 Epithelializati Area: (cm) 3.204 Tunneling: Volume: (cm) 0.641 Undermining: Area: 32% Volume: -36.1% on: Large (67-100%) No No Wound Description Classification: Partial Thickness Wound Margin: Indistinct, nonvisible Exudate Amount: Small Exudate Type: Serosanguineous Exudate Color: red, brown Foul Odor After Cleansing: No Wound Bed Granulation Amount: Medium (34-66%) Exposed Structure Granulation Quality: Pink, Pale Fascia Exposed: No Necrotic Amount: Small (1-33%) Fat Layer Exposed: No [name], [name]. ([id number]) Necrotic Quality: Adherent Slough Tendon Exposed: No Muscle Exposed: No Joint  Exposed: No Bone Exposed: No Limited to Skin Breakdown Periwound Skin Texture Texture Color No Abnormalities Noted: No No Abnormalities Noted: No Callus: No Atrophie Blanche: No Crepitus: No Cyanosis: No Excoriation: No Ecchymosis: No Fluctuance: No Erythema: No Friable: No Hemosiderin Staining: No Induration: No Mottled: No Localized Edema: Yes Pallor: No Rash: No Rubor: No Scarring: No Temperature / Pain Moisture Temperature: No Abnormality No Abnormalities Noted: No Tenderness on Palpation: Yes Dry / Scaly: No Maceration: No Moist: Yes Wound Preparation Ulcer Cleansing: Rinsed/Irrigated with Saline Topical Anesthetic Applied: Other: lidocaine 4%, Electronic Signature(s) Signed: 04/29/2015 5:09:34 PM By: [name] Entered By: [name] on 04/29/2015 15:57:30 Brame, [name] ([id number]) -------------------------------------------------------------------------------- Wound Assessment Details Patient Name: [name] Date of Service: 04/29/2015 3:30 PM Medical Record Number: [id number] Patient Account Number: [id number] Date of Birth/Sex: 12-28-1912 (81 y.o. Female) Treating RN: [name] Primary Care Physician: PATIENT, NO Other Clinician: Referring Physician: Marisue [name] Treating Physician/Extender: [name] in Treatment: 8 Wound Status Wound Number: 5 Primary Etiology: To be determined Wound Location: Right Foot - Dorsal Wound Status: Open Wounding Event: Blister Comorbid History: Hypertension, Osteoarthritis Date Acquired: 04/29/2015 Weeks Of Treatment: 0 Clustered Wound: No Photos Photo Uploaded By: [name] on 04/29/2015 17:08:25 Wound Measurements Length: (cm) 1.5 % Reduction in Width: (cm) 1.5 % Reduction in Depth: (cm) 0.1 Epithelializati Area: (cm) 1.767 Tunneling: Volume: (cm) 0.177 Undermining: Area: 0% Volume: 0% on: Small (1-33%) No No Wound Description Classification: Partial  Thickness Wound Margin: Flat and Intact Exudate Amount: Medium Exudate Type: Serous Exudate Color: amber Foul Odor After Cleansing: No Wound Bed Granulation Amount: Large (67-100%) Exposed Structure Granulation Quality: Red Fascia Exposed: No Necrotic Amount: None Present (0%) Fat Layer Exposed: No Tendon Exposed: No Coy, [name]. ([id number]) Muscle Exposed: No Joint Exposed: No Bone Exposed: No Limited to Skin Breakdown Periwound Skin Texture Texture Color No Abnormalities Noted: No No Abnormalities Noted: No Callus: No Atrophie Blanche: No Crepitus: No Cyanosis: No Excoriation: No Ecchymosis: No Fluctuance: No Erythema: No Friable: No Hemosiderin Staining: No Induration: No Mottled: No Localized Edema: No Pallor: No Rash: No Rubor: No Scarring: No Temperature / Pain Moisture Temperature: No Abnormality No Abnormalities Noted: No Dry / Scaly: No Maceration: No Moist: No Wound Preparation Ulcer Cleansing: Rinsed/Irrigated with Saline Topical Anesthetic  Applied: None Electronic Signature(s) Signed: 04/29/2015 5:09:34 PM By: [name] Entered By: [name] on 04/29/2015 15:57:56 Monarch, [name] ([id number]) -------------------------------------------------------------------------------- Vitals Details Patient Name: [name] Date of Service: 04/29/2015 3:30 PM Medical Record Number: [id number] Patient Account Number: [id number] Date of Birth/Sex: 04-08-1913 (81 y.o. Female) Treating RN: [name] Primary Care Physician: PATIENT, NO Other Clinician: Referring Physician: Marisue [name] Treating Physician/Extender: [name] in Treatment: 8 Vital Signs Time Taken: 15:40 Temperature (F): 97.5 Height (in): 64 Pulse (bpm): 72 Weight (lbs): 147 Respiratory Rate (breaths/min): 16 Body Mass Index (BMI): 25.2 Blood Pressure (mmHg): 102/73 Reference Range: 80 - 120 mg / dl Electronic Signature(s) Signed: 04/29/2015 5:09:34  PM By: [name] Entered By: [name] on 04/29/2015 15:42:36

## 2015-05-05 ENCOUNTER — Encounter: Payer: Medicare Other | Admitting: Surgery

## 2015-05-05 DIAGNOSIS — L97212 Non-pressure chronic ulcer of right calf with fat layer exposed: Secondary | ICD-10-CM | POA: Diagnosis not present

## 2015-05-06 NOTE — Progress Notes (Signed)
[name] ([id number]) Visit Report for 05/05/2015 Arrival Information Details Patient Name: [name] Date of Service: 05/05/2015 11:30 AM Medical Record Number: [id number] Patient Account Number: [id number] Date of Birth/Sex: 11/13/1912 (81 y.o. Female) Treating RN: [name], RN, BSN, [employer] Primary Care Physician: PATIENT, NO Other Clinician: Referring Physician: Marisue Ivan Treating Physician/Extender: Rudene Re in Treatment: 9 Visit Information History Since Last Visit Added or deleted any medications: No Patient Arrived: Wheel Chair Any new allergies or adverse reactions: No Arrival Time: 11:07 Had a fall or experienced change in No Accompanied By: CAREGIVER activities of daily living that may affect Transfer Assistance: EasyPivot Patient risk of falls: Lift Signs or symptoms of abuse/neglect since last No Patient Identification Verified: Yes visito Secondary Verification Process Yes Hospitalized since last visit: No Completed: Has Dressing in Place as Prescribed: Yes Patient Requires Transmission- No Pain Present Now: No Based Precautions: Patient Has Alerts: Yes Electronic Signature(s) Signed: 05/05/2015 11:08:34 AM By: Elpidio Eric BSN, RN Entered By: Elpidio Eric on 05/05/2015 11:08:34 [name] ([id number]) -------------------------------------------------------------------------------- Clinic Level of Care Assessment Details Patient Name: [name] Date of Service: 05/05/2015 11:30 AM Medical Record Number: [id number] Patient Account Number: [id number] Date of Birth/Sex: [birth year] (81 y.o. Female) Treating RN: [name], RN, BSN, Psychologist, clinical Primary Care Physician: PATIENT, NO Other Clinician: Referring Physician: Marisue Ivan Treating Physician/Extender: Rudene Re in Treatment: 9 Clinic Level of Care Assessment Items TOOL 4 Quantity Score []  - Use when only an EandM is performed on FOLLOW-UP visit 0 ASSESSMENTS -  Nursing Assessment / Reassessment []  - Reassessment of Co-morbidities (includes updates in patient status) 0 []  - Reassessment of Adherence to Treatment Plan 0 ASSESSMENTS - Wound and Skin Assessment / Reassessment []  - Simple Wound Assessment / Reassessment - one wound 0 X - Complex Wound Assessment / Reassessment - multiple wounds 3 5 []  - Dermatologic / Skin Assessment (not related to wound area) 0 ASSESSMENTS - Focused Assessment []  - Circumferential Edema Measurements - multi extremities 0 []  - Nutritional Assessment / Counseling / Intervention 0 X - Lower Extremity Assessment (monofilament, tuning fork, pulses) 1 5 []  - Peripheral Arterial Disease Assessment (using hand held doppler) 0 ASSESSMENTS - Ostomy and/or Continence Assessment and Care []  - Incontinence Assessment and Management 0 []  - Ostomy Care Assessment and Management (repouching, etc.) 0 PROCESS - Coordination of Care X - Simple Patient / Family Education for ongoing care 1 15 []  - Complex (extensive) Patient / Family Education for ongoing care 0 []  - Staff obtains Chiropractor, Records, Test Results / Process Orders 0 []  - Staff telephones HHA, Nursing Homes / Clarify orders / etc 0 []  - Routine Transfer to another Facility (non-emergent condition) 0 Dumler, Brittyn L. ([id number]) []  - Routine Hospital Admission (non-emergent condition) 0 []  - New Admissions / Manufacturing engineer / Ordering NPWT, Apligraf, etc. 0 []  - Emergency Hospital Admission (emergent condition) 0 []  - Simple Discharge Coordination 0 []  - Complex (extensive) Discharge Coordination 0 PROCESS - Special Needs []  - Pediatric / Minor Patient Management 0 []  - Isolation Patient Management 0 []  - Hearing / Language / Visual special needs 0 []  - Assessment of Community assistance (transportation, D/C planning, etc.) 0 []  - Additional assistance / Altered mentation 0 []  - Support Surface(s) Assessment (bed, cushion, seat, etc.) 0 INTERVENTIONS -  Wound Cleansing / Measurement []  - Simple Wound Cleansing - one wound 0 X - Complex Wound Cleansing - multiple wounds 3 5 X - Wound Imaging (photographs - any  number of wounds) 1 5 []  - Wound Tracing (instead of photographs) 0 []  - Simple Wound Measurement - one wound 0 []  - Complex Wound Measurement - multiple wounds 0 INTERVENTIONS - Wound Dressings X - Small Wound Dressing one or multiple wounds 3 10 []  - Medium Wound Dressing one or multiple wounds 0 []  - Large Wound Dressing one or multiple wounds 0 []  - Application of Medications - topical 0 []  - Application of Medications - injection 0 INTERVENTIONS - Miscellaneous []  - External ear exam 0 Breeland, Anaisabel L. ([id number]) []  - Specimen Collection (cultures, biopsies, blood, body fluids, etc.) 0 []  - Specimen(s) / Culture(s) sent or taken to Lab for analysis 0 []  - Patient Transfer (multiple staff / Michiel Sites Lift / Similar devices) 0 []  - Simple Staple / Suture removal (25 or less) 0 []  - Complex Staple / Suture removal (26 or more) 0 []  - Hypo / Hyperglycemic Management (close monitor of Blood Glucose) 0 []  - Ankle / Brachial Index (ABI) - do not check if billed separately 0 X - Vital Signs 1 5 Has the patient been seen at the hospital within the last three years: Yes Total Score: 90 Level Of Care: New/Established - Level 3 Electronic Signature(s) Signed: 05/05/2015 11:30:41 AM By: Elpidio Eric BSN, RN Entered By: Elpidio Eric on 05/05/2015 11:30:40 [name] ([id number]) -------------------------------------------------------------------------------- Encounter Discharge Information Details Patient Name: [name] Date of Service: 05/05/2015 11:30 AM Medical Record Number: [id number] Patient Account Number: [id number] Date of Birth/Sex: 25-Nov-1912 (81 y.o. Female) Treating RN: [name] Primary Care Physician: PATIENT, NO Other Clinician: Referring Physician: Marisue Ivan Treating Physician/Extender:  Rudene Re in Treatment: 9 Encounter Discharge Information Items Discharge Pain Level: 0 Discharge Condition: Stable Ambulatory Status: Wheelchair Discharge Destination: Nursing Home Transportation: Private Auto Accompanied By: caregiver Schedule Follow-up Appointment: No Medication Reconciliation completed No and provided to Patient/Care Sarahi Borland: Provided on Clinical Summary of Care: 05/05/2015 Form Type Recipient Paper Patient NP Electronic Signature(s) Signed: 05/05/2015 11:34:37 AM By: Gwenlyn Perking Previous Signature: 05/05/2015 11:34:12 AM Version By: Elpidio Eric BSN, RN Entered By: Gwenlyn Perking on 05/05/2015 11:34:37 Wilber, Lelon Yu ([id number]) -------------------------------------------------------------------------------- Lower Extremity Assessment Details Patient Name: [name] Date of Service: 05/05/2015 11:30 AM Medical Record Number: [id number] Patient Account Number: [id number] Date of Birth/Sex: 12-02-1912 (81 y.o. Female) Treating RN: [name], RN, BSN, Psychologist, clinical Primary Care Physician: PATIENT, NO Other Clinician: Referring Physician: Marisue Ivan Treating Physician/Extender: Rudene Re in Treatment: 9 Edema Assessment Assessed: [Left: No] [Right: No] E[Left: dema] [Right: :] Calf Left: Right: Point of Measurement: 35 cm From Medial Instep 34.5 cm 34.8 cm Ankle Left: Right: Point of Measurement: 8 cm From Medial Instep 25.2 cm 25.6 cm Vascular Assessment Claudication: Claudication Assessment [Left:None] [Right:None] Pulses: Posterior Tibial Dorsalis Pedis Palpable: [Left:Yes] [Right:Yes] Extremity colors, hair growth, and conditions: Extremity Color: [Left:Mottled] [Right:Mottled] Hair Growth on Extremity: [Left:No] [Right:No] Temperature of Extremity: [Left:Warm] [Right:Warm] Capillary Refill: [Left:< 3 seconds] [Right:< 3 seconds] Toe Nail Assessment Left: Right: Thick: Yes Yes Discolored: Yes Yes Deformed: No  No Improper Length and Hygiene: No No Electronic Signature(s) Signed: 05/05/2015 11:12:50 AM By: Elpidio Eric BSN, RN Entered By: Elpidio Eric on 05/05/2015 11:12:49 Tapp, Lelon Yu ([id number]) Kakar, Lelon Yu ([id number]) -------------------------------------------------------------------------------- Multi Wound Chart Details Patient Name: [name] Date of Service: 05/05/2015 11:30 AM Medical Record Number: [id number] Patient Account Number: [id number] Date of Birth/Sex: [birth year] (81 y.o. Female) Treating RN: [name], RN, BSN, [facility] Primary Care Physician: PATIENT,  NO Other Clinician: Referring Physician: Marisue Ivan Treating Physician/Extender: Rudene Re in Treatment: 9 Vital Signs Height(in): 64 Pulse(bpm): 74 Weight(lbs): 147 Blood Pressure 124/58 (mmHg): Body Mass Index(BMI): 25 Temperature(F): 97.9 Respiratory Rate 16 (breaths/min): Photos: [1:No Photos] [3:No Photos] [5:No Photos] Wound Location: [1:Left Lower Leg - Anterior Right Lower Leg - Midline, Right Foot - Dorsal] [3:Anterior] Wounding Event: [1:Trauma] [3:Blister] [5:Blister] Primary Etiology: [1:Skin Tear] [3:Lymphedema] [5:To be determined] Comorbid History: [1:Hypertension, Osteoarthritis] [3:Hypertension, Osteoarthritis] [5:Hypertension, Osteoarthritis] Date Acquired: [1:01/31/2015] [3:03/14/2015] [5:04/29/2015] Weeks of Treatment: [1:9] [3:7] [5:0] Wound Status: [1:Open] [3:Open] [5:Open] Measurements L x W x D 6x2.5x0.1 [3:3x1.5x0.2] [5:1x1.4x0.1] (cm) Area (cm) : [1:11.781] [3:3.534] [5:1.1] Volume (cm) : [1:1.178] [3:0.707] [5:0.11] % Reduction in Area: [1:67.10%] [3:25.00%] [5:37.70%] % Reduction in Volume: 89.00% [3:-50.10%] [5:37.90%] Classification: [1:Full Thickness Without Exposed Support Structures] [3:Partial Thickness] [5:Partial Thickness] Exudate Amount: [1:Medium] [3:Small] [5:Medium] Exudate Type: [1:Serosanguineous] [3:Serosanguineous] [5:Serous] Exudate Color:  [1:red, brown] [3:red, brown] [5:amber] Wound Margin: [1:Distinct, outline attached Indistinct, nonvisible] [5:Flat and Intact] Granulation Amount: [1:Medium (34-66%)] [3:Medium (34-66%)] [5:Large (67-100%)] Granulation Quality: [1:Pink, Pale] [3:Pink, Pale] [5:Red] Necrotic Amount: [1:Medium (34-66%)] [3:Small (1-33%)] [5:None Present (0%)] Necrotic Tissue: [1:Eschar, Adherent Slough Adherent Slough] [5:N/A] Exposed Structures: [1:Fascia: No Fat: No Tendon: No] [3:Fascia: No Fat: No Tendon: No] [5:Fascia: No Fat: No Tendon: No] Muscle: No Muscle: No Muscle: No Joint: No Joint: No Joint: No Bone: No Bone: No Bone: No Limited to Skin Limited to Skin Limited to Skin Breakdown Breakdown Breakdown Epithelialization: Small (1-33%) Large (67-100%) Small (1-33%) Periwound Skin Texture: Edema: Yes Edema: Yes Edema: No Excoriation: No Excoriation: No Excoriation: No Induration: No Induration: No Induration: No Callus: No Callus: No Callus: No Crepitus: No Crepitus: No Crepitus: No Fluctuance: No Fluctuance: No Fluctuance: No Friable: No Friable: No Friable: No Rash: No Rash: No Rash: No Scarring: No Scarring: No Scarring: No Periwound Skin Moist: Yes Moist: Yes Moist: Yes Moisture: Maceration: No Maceration: No Maceration: No Dry/Scaly: No Dry/Scaly: No Dry/Scaly: No Periwound Skin Color: Atrophie Blanche: No Atrophie Blanche: No Atrophie Blanche: No Cyanosis: No Cyanosis: No Cyanosis: No Ecchymosis: No Ecchymosis: No Ecchymosis: No Erythema: No Erythema: No Erythema: No Hemosiderin Staining: No Hemosiderin Staining: No Hemosiderin Staining: No Mottled: No Mottled: No Mottled: No Pallor: No Pallor: No Pallor: No Rubor: No Rubor: No Rubor: No Temperature: No Abnormality No Abnormality No Abnormality Tenderness on Yes Yes No Palpation: Wound Preparation: Ulcer Cleansing: Ulcer Cleansing: Ulcer Cleansing: Rinsed/Irrigated with Rinsed/Irrigated  with Rinsed/Irrigated with Saline Saline Saline Topical Anesthetic Topical Anesthetic Topical Anesthetic Applied: Other: Lidocaine Applied: Other: lidocaine Applied: None 4% 4% Treatment Notes Electronic Signature(s) Signed: 05/05/2015 11:21:28 AM By: Elpidio Eric BSN, RN Entered By: Elpidio Eric on 05/05/2015 11:21:28 Pinkus, Lelon Yu ([id number]) -------------------------------------------------------------------------------- Multi-Disciplinary Care Plan Details Patient Name: [name] Date of Service: 05/05/2015 11:30 AM Medical Record Number: [id number] Patient Account Number: [id number] Date of Birth/Sex: 11-21-1912 (81 y.o. Female) Treating RN: [name], RN, BSN, [employer] Primary Care Physician: PATIENT, NO Other Clinician: Referring Physician: Marisue Ivan Treating Physician/Extender: Rudene Re in Treatment: 9 Active Inactive Abuse / Safety / Falls / Self Care Management Nursing Diagnoses: Impaired home maintenance Impaired physical mobility Potential for falls Self care deficit: actual or potential Goals: Patient will remain injury free Date Initiated: 03/03/2015 Goal Status: Active Patient/caregiver will verbalize understanding of skin care regimen Date Initiated: 03/03/2015 Goal Status: Active Patient/caregiver will verbalize/demonstrate measure taken to improve self care Date Initiated: 03/03/2015 Goal Status: Active Patient/caregiver will verbalize/demonstrate measures taken to improve  the patient's personal safety Date Initiated: 03/03/2015 Goal Status: Active Patient/caregiver will verbalize/demonstrate measures taken to prevent injury and/or falls Date Initiated: 03/03/2015 Goal Status: Active Patient/caregiver will verbalize/demonstrate understanding of what to do in case of emergency Date Initiated: 03/03/2015 Goal Status: Active Interventions: Assess: immobility, friction, shearing, incontinence upon admission and as needed Assess impairment of mobility  on admission and as needed per policy Provide education on basic hygiene Provide education on personal and home safety Provide education on safe transfers DELSA, WALDER ([id number]) Treatment Activities: Education provided on Basic Hygiene : 03/10/2015 Notes: Orientation to the Wound Care Program Nursing Diagnoses: Knowledge deficit related to the wound healing center program Goals: Patient/caregiver will verbalize understanding of the Wound Healing Center Program Date Initiated: 03/03/2015 Goal Status: Active Interventions: Provide education on orientation to the wound center Notes: Venous Leg Ulcer Nursing Diagnoses: Knowledge deficit related to disease process and management Potential for venous Insuffiency (use before diagnosis confirmed) Goals: Patient will maintain optimal edema control Date Initiated: 03/03/2015 Goal Status: Active Patient/caregiver will verbalize understanding of disease process and disease management Date Initiated: 03/03/2015 Goal Status: Active Verify adequate tissue perfusion prior to therapeutic compression application Date Initiated: 03/03/2015 Goal Status: Active Interventions: Assess peripheral edema status every visit. Compression as ordered Provide education on venous insufficiency Treatment Activities: Test ordered outside of clinic : 05/05/2015 Notes: ALYNA, STENSLAND ([id number]) Wound/Skin Impairment Nursing Diagnoses: Impaired tissue integrity Knowledge deficit related to ulceration/compromised skin integrity Goals: Patient/caregiver will verbalize understanding of skin care regimen Date Initiated: 03/03/2015 Goal Status: Active Ulcer/skin breakdown will have a volume reduction of 30% by week 4 Date Initiated: 03/03/2015 Goal Status: Active Ulcer/skin breakdown will have a volume reduction of 50% by week 8 Date Initiated: 03/03/2015 Goal Status: Active Ulcer/skin breakdown will have a volume reduction of 80% by week 12 Date Initiated:  03/03/2015 Goal Status: Active Ulcer/skin breakdown will heal within 14 weeks Date Initiated: 03/03/2015 Goal Status: Active Interventions: Assess patient/caregiver ability to perform ulcer/skin care regimen upon admission and as needed Assess ulceration(s) every visit Provide education on ulcer and skin care Notes: Electronic Signature(s) Signed: 05/05/2015 11:21:04 AM By: Elpidio Eric BSN, RN Entered By: Elpidio Eric on 05/05/2015 11:21:04 Mccullum, Lelon Yu ([id number]) -------------------------------------------------------------------------------- Pain Assessment Details Patient Name: [name] Date of Service: 05/05/2015 11:30 AM Medical Record Number: [id number] Patient Account Number: [id number] Date of Birth/Sex: 1913/05/26 (81 y.o. Female) Treating RN: Leah Mealy, RN, BSN, [place] [facility] Primary Care Physician: PATIENT, NO Other Clinician: Referring Physician: Marisue Ivan Treating Physician/Extender: Rudene Re in Treatment: 9 Active Problems Location of Pain Severity and Description of Pain Patient Has Paino No Site Locations Pain Management and Medication Current Pain Management: Electronic Signature(s) Signed: 05/05/2015 11:08:40 AM By: Elpidio Eric BSN, RN Entered By: Elpidio Eric on 05/05/2015 11:08:40 Gracey, Lelon Yu ([id number]) -------------------------------------------------------------------------------- Patient/Caregiver Education Details Patient Name: [name] Date of Service: 05/05/2015 11:30 AM Medical Record Number: [id number] Patient Account Number: [id number] Date of Birth/Gender: 1912/10/05 (81 y.o. Female) Treating RN: [name], RN, BSN, [employer] Primary Care Physician: PATIENT, NO Other Clinician: Referring Physician: Marisue Ivan Treating Physician/Extender: Rudene Re in Treatment: 9 Education Assessment Education Provided To: Patient and Caregiver Education Topics Provided Basic Hygiene: Methods: Explain/Verbal Responses:  State content correctly Safety: Methods: Explain/Verbal Responses: State content correctly Venous: Methods: Explain/Verbal Responses: State content correctly Welcome To The Wound [facility] Center: Methods: Explain/Verbal Responses: State content correctly Wound/Skin Impairment: Methods: Explain/Verbal Responses: State content correctly Electronic Signature(s) Signed: 05/05/2015 11:34:34 AM By: Elpidio Eric BSN,  RN Entered By: Elpidio Eric on 05/05/2015 11:34:34 Dahlem, Lelon Yu ([id number]) -------------------------------------------------------------------------------- Wound Assessment Details Patient Name: MAYTE, DIERS Date of Service: 05/05/2015 11:30 AM Medical Record Number: [id number] Patient Account Number: [id number] Date of Birth/Sex: 1913/06/18 (81 y.o. Female) Treating RN: [name], RN, BSN, Psychologist, clinical Primary Care Physician: PATIENT, NO Other Clinician: Referring Physician: Marisue Ivan Treating Physician/Extender: Rudene Re in Treatment: 9 Wound Status Wound Number: 1 Primary Etiology: Skin Tear Wound Location: Left Lower Leg - Anterior Wound Status: Open Wounding Event: Trauma Comorbid History: Hypertension, Osteoarthritis Date Acquired: 01/31/2015 Weeks Of Treatment: 9 Clustered Wound: No Photos Photo Uploaded By: Elpidio Eric on 05/05/2015 16:36:32 Wound Measurements Length: (cm) 6 Width: (cm) 2.5 Depth: (cm) 0.1 Area: (cm) 11.781 Volume: (cm) 1.178 % Reduction in Area: 67.1% % Reduction in Volume: 89% Epithelialization: Small (1-33%) Tunneling: No Undermining: No Wound Description Full Thickness Without Exposed Classification: Support Structures Wound Margin: Distinct, outline attached Exudate Medium Amount: Exudate Type: Serosanguineous Exudate Color: red, brown Foul Odor After Cleansing: No Wound Bed Granulation Amount: Medium (34-66%) Exposed Structure Granulation Quality: Pink, Pale Fascia Exposed: No Necrotic Amount: Medium  (34-66%) Fat Layer Exposed: No Mullenbach, Keundra L. ([id number]) Necrotic Quality: Eschar, Adherent Slough Tendon Exposed: No Muscle Exposed: No Joint Exposed: No Bone Exposed: No Limited to Skin Breakdown Periwound Skin Texture Texture Color No Abnormalities Noted: No No Abnormalities Noted: No Callus: No Atrophie Blanche: No Crepitus: No Cyanosis: No Excoriation: No Ecchymosis: No Fluctuance: No Erythema: No Friable: No Hemosiderin Staining: No Induration: No Mottled: No Localized Edema: Yes Pallor: No Rash: No Rubor: No Scarring: No Temperature / Pain Moisture Temperature: No Abnormality No Abnormalities Noted: No Tenderness on Palpation: Yes Dry / Scaly: No Maceration: No Moist: Yes Wound Preparation Ulcer Cleansing: Rinsed/Irrigated with Saline Topical Anesthetic Applied: Other: Lidocaine 4%, Treatment Notes Wound #1 (Left, Anterior Lower Leg) 1. Cleansed with: Clean wound with Normal Saline 4. Dressing Applied: Aquacel Ag 5. Secondary Dressing Applied Guaze, ABD and kerlix/Conform 7. Secured with Tape Tubigrip Notes prisma to right dorsal foot Electronic Signature(s) Signed: 05/05/2015 11:18:04 AM By: Elpidio Eric BSN, RN Entered By: Elpidio Eric on 05/05/2015 11:18:04 Cilento, Lelon Yu ([id number]) Cuartas, Lelon Yu ([id number]) -------------------------------------------------------------------------------- Wound Assessment Details Patient Name: [name] Date of Service: 05/05/2015 11:30 AM Medical Record Number: [id number] Patient Account Number: [id number] Date of Birth/Sex: 23-Jan-1913 (81 y.o. Female) Treating RN: [name], RN, BSN, Psychologist, clinical Primary Care Physician: PATIENT, NO Other Clinician: Referring Physician: Marisue Ivan Treating Physician/Extender: Rudene Re in Treatment: 9 Wound Status Wound Number: 3 Primary Etiology: Lymphedema Wound Location: Right Lower Leg - Midline, Wound Status: Open Anterior Comorbid History:  Hypertension, Osteoarthritis Wounding Event: Blister Date Acquired: 03/14/2015 Weeks Of Treatment: 7 Clustered Wound: No Photos Photo Uploaded By: Elpidio Eric on 05/05/2015 16:37:21 Wound Measurements Length: (cm) 3 Width: (cm) 1.5 Depth: (cm) 0.2 Area: (cm) 3.534 Volume: (cm) 0.707 % Reduction in Area: 25% % Reduction in Volume: -50.1% Epithelialization: Large (67-100%) Tunneling: No Undermining: No Wound Description Classification: Partial Thickness Wound Margin: Indistinct, nonvisible Exudate Amount: Small Exudate Type: Serosanguineous Exudate Color: red, brown Foul Odor After Cleansing: No Wound Bed Granulation Amount: Medium (34-66%) Exposed Structure Granulation Quality: Pink, Pale Fascia Exposed: No Necrotic Amount: Small (1-33%) Fat Layer Exposed: No Petion, Mattelyn L. ([id number]) Necrotic Quality: Adherent Slough Tendon Exposed: No Muscle Exposed: No Joint Exposed: No Bone Exposed: No Limited to Skin Breakdown Periwound Skin Texture Texture Color No Abnormalities Noted: No No Abnormalities Noted: No Callus: No Atrophie Blanche: No  Crepitus: No Cyanosis: No Excoriation: No Ecchymosis: No Fluctuance: No Erythema: No Friable: No Hemosiderin Staining: No Induration: No Mottled: No Localized Edema: Yes Pallor: No Rash: No Rubor: No Scarring: No Temperature / Pain Moisture Temperature: No Abnormality No Abnormalities Noted: No Tenderness on Palpation: Yes Dry / Scaly: No Maceration: No Moist: Yes Wound Preparation Ulcer Cleansing: Rinsed/Irrigated with Saline Topical Anesthetic Applied: Other: lidocaine 4%, Treatment Notes Wound #3 (Right, Midline, Anterior Lower Leg) 1. Cleansed with: Clean wound with Normal Saline 4. Dressing Applied: Aquacel Ag 5. Secondary Dressing Applied Guaze, ABD and kerlix/Conform 7. Secured with Tape Tubigrip Notes prisma to right dorsal foot Electronic Signature(s) Signed: 05/05/2015 11:18:30 AM By:  Elpidio Eric BSN, RN Entered By: Elpidio Eric on 05/05/2015 11:18:30 Cortner, Lelon Yu ([id number]) Nardone, Lelon Yu ([id number]) -------------------------------------------------------------------------------- Wound Assessment Details Patient Name: [name] Date of Service: 05/05/2015 11:30 AM Medical Record Number: [id number] Patient Account Number: [id number] Date of Birth/Sex: 1913/01/30 (81 y.o. Female) Treating RN: Leah Mealy, RN, BSN, [employer] Primary Care Physician: PATIENT, NO Other Clinician: Referring Physician: Marisue Ivan Treating Physician/Extender: Rudene Re in Treatment: 9 Wound Status Wound Number: 5 Primary Etiology: To be determined Wound Location: Right Foot - Dorsal Wound Status: Open Wounding Event: Blister Comorbid History: Hypertension, Osteoarthritis Date Acquired: 04/29/2015 Weeks Of Treatment: 0 Clustered Wound: No Photos Photo Uploaded By: Elpidio Eric on 05/05/2015 16:37:22 Wound Measurements Length: (cm) 1 Width: (cm) 1.4 Depth: (cm) 0.1 Area: (cm) 1.1 Volume: (cm) 0.11 % Reduction in Area: 37.7% % Reduction in Volume: 37.9% Epithelialization: Small (1-33%) Tunneling: No Undermining: No Wound Description Classification: Partial Thickness Wound Margin: Flat and Intact Exudate Amount: Medium Exudate Type: Serous Exudate Color: amber Foul Odor After Cleansing: No Wound Bed Granulation Amount: Large (67-100%) Exposed Structure Granulation Quality: Red Fascia Exposed: No Necrotic Amount: None Present (0%) Fat Layer Exposed: No Tendon Exposed: No [place], Joycelyn L. ([id number]) Muscle Exposed: No Joint Exposed: No Bone Exposed: No Limited to Skin Breakdown Periwound Skin Texture Texture Color No Abnormalities Noted: No No Abnormalities Noted: No Callus: No Atrophie Blanche: No Crepitus: No Cyanosis: No Excoriation: No Ecchymosis: No Fluctuance: No Erythema: No Friable: No Hemosiderin Staining: No Induration:  No Mottled: No Localized Edema: No Pallor: No Rash: No Rubor: No Scarring: No Temperature / Pain Moisture Temperature: No Abnormality No Abnormalities Noted: No Dry / Scaly: No Maceration: No Moist: Yes Wound Preparation Ulcer Cleansing: Rinsed/Irrigated with Saline Topical Anesthetic Applied: None Treatment Notes Wound #5 (Right, Dorsal Foot) 1. Cleansed with: Clean wound with Normal Saline 4. Dressing Applied: Aquacel Ag 5. Secondary Dressing Applied Guaze, ABD and kerlix/Conform 7. Secured with Tape Tubigrip Notes prisma to right dorsal foot Electronic Signature(s) Signed: 05/05/2015 11:20:24 AM By: Elpidio Eric BSN, RN Entered By: Elpidio Eric on 05/05/2015 11:20:24 Cassaday, Lelon Yu ([id number]) -------------------------------------------------------------------------------- Vitals Details Patient Name: [name] Date of Service: 05/05/2015 11:30 AM Medical Record Number: [id number] Patient Account Number: [id number] Date of Birth/Sex: 11-18-1912 (81 y.o. Female) Treating RN: [name], RN, BSN, Rita Primary Care Physician: PATIENT, NO Other Clinician: Referring Physician: Marisue Ivan Treating Physician/Extender: Rudene Re in Treatment: 9 Vital Signs Time Taken: 11:08 Temperature (F): 97.9 Height (in): 64 Pulse (bpm): 74 Weight (lbs): 147 Respiratory Rate (breaths/min): 16 Body Mass Index (BMI): 25.2 Blood Pressure (mmHg): 124/58 Reference Range: 80 - 120 mg / dl Electronic Signature(s) Signed: 05/05/2015 11:11:39 AM By: Elpidio Eric BSN, RN Entered By: Elpidio Eric on 05/05/2015 11:11:39

## 2015-05-06 NOTE — Progress Notes (Signed)
[name], [name] ([id number]) Visit Report for 05/05/2015 Chief Complaint Document Details Patient Name: [name], [name] Date of Service: 05/05/2015 11:30 AM Medical Record Patient Account Number: [id number] Number: [name], RN, BSN, Treating RN: 06/18/1913 (81 y.o. [place] Sink Date of Birth/Sex: Female) Other Clinician: Primary Care Physician: PATIENT, NO Treating Bayan Kushnir Referring Physician: Marisue Ivan Physician/Extender: Weeks in Treatment: 9 Information Obtained from: Patient Chief Complaint Patient seen for complaints of Non-Healing Wound. pleasant 81 year old patient who had a fall and injured both lower extremities on 01/31/2015 Electronic Signature(s) Signed: 05/05/2015 11:28:39 AM By: Evlyn Kanner MD, FACS Entered By: Evlyn Kanner on 05/05/2015 11:28:39 Blacklock, [name] ([id number]) -------------------------------------------------------------------------------- HPI Details Patient Name: [name] Date of Service: 05/05/2015 11:30 AM Medical Record Patient Account Number: [id number] Number: [name], RN, BSN, Treating RN: [birth year] (81 y.o. [place] Sink Date of Birth/Sex: Female) Other Clinician: Primary Care Physician: PATIENT, NO Treating Kallista Pae Referring Physician: Marisue Ivan Physician/Extender: Weeks in Treatment: 9 History of Present Illness Location: both lower extremities Quality: Patient reports experiencing a dull pain to affected area(s). Severity: Patient states wound (s) are getting better. Duration: Patient has had the wound for < 4 weeks prior to presenting for treatment Timing: Pain in wound is Intermittent (comes and goes Context: The wound occurred when the patient had a fall and lacerated both lower extremities. Modifying Factors: Other treatment(s) tried include:she had sutures applied there for a while and when they were removed the wound opened up. Associated Signs and Symptoms: Patient reports having  difficulty standing for long periods. HPI Description: 81 year old female who fell and had an injury to her right forehead, right elbow and both shins on 01/31/2015. She went to the ER at [facility] and had been treated appropriately with sutures being placed to her right and left lower extremity. Also had a CT scan which showed no intracranial hemorrhage and only a large scalp hematoma on the right frontal region.she was put on Keflex for 5 days. she had also received Bactrim for 14 days.Since then she has been seen by her PCP and he had put her on Augmentin for 14 days and wound culture was obtained. culture reports were reviewed -- she grew an MRSA sensitive to tetracycline, Bactrim and vancomycin. Her past medical history significant for osteoporosis, hypertension, spinal stenosis, right hip replacement, appendectomy and abdomen hysterectomy for ovarian cancer. She has been seen in the past by the vascular surgery group at [facility] and she has an appointment to see them again. She is known to use lymphedema pumps while she has been in her assisted living home but has not been using them for the last month. She does also have compression stockings which she uses intermittently. 03/17/2015 -- She was seen by Dr. Gilda Crease on 03/03/2015 -- he reviewed the patientos case and recommended no surgical intervention. He has recommended compression stockings of the 20-30 mmHg variety and also recommended lymphedema pumps. He will see her back in 3 months. The patient has developed some blisters on both lower extremities and this may be due to the fact that she's had a Kerlix gauze wrap on both lower extremities and is also using the lymphedema pumps. 05/09/2015 -- she has a new wound on the dorsum of the right foot just proximal to the toes but this was probably a blister which opened out and is very superficial. Electronic Signature(s) Signed: 05/05/2015 11:28:50 AM By: Evlyn Kanner MD,  FACS Entered By: Evlyn Kanner on 05/05/2015 11:28:49 [name], Leah L. ([id number])  [name], [name] ([id number]) -------------------------------------------------------------------------------- Physical Exam Details Patient Name: [name], [name] Date of Service: 05/05/2015 11:30 AM Medical Record Patient Account Number: [id number] Number: [name], RN, BSN, Treating RN: 10-21-1912 [phone] y.o. [place] Sink Date of Birth/Sex: Female) Other Clinician: Primary Care Physician: PATIENT, NO Treating Marke Goodwyn Referring Physician: Marisue Ivan Physician/Extender: Weeks in Treatment: 9 Constitutional . Pulse regular. Respirations normal and unlabored. Afebrile. . Eyes Nonicteric. Reactive to light. Ears, Nose, Mouth, and Throat Lips, teeth, and gums WNL.Marland Kitchen Moist mucosa without lesions . Neck supple and nontender. No palpable supraclavicular or cervical adenopathy. Normal sized without goiter. Respiratory WNL. No retractions.. Cardiovascular Pedal Pulses WNL. she continues to have +1 pitting edema both lower extremities. Lymphatic No adneopathy. No adenopathy. No adenopathy. Musculoskeletal Adexa without tenderness or enlargement.. Digits and nails w/o clubbing, cyanosis, infection, petechiae, ischemia, or inflammatory conditions.. Integumentary (Hair, Skin) No suspicious lesions. No crepitus or fluctuance. No peri-wound warmth or erythema. No masses.Marland Kitchen Psychiatric Judgement and insight Intact.. No evidence of depression, anxiety, or agitation.. Notes The wound on her right forefoot looks very clean and we will continue to dress it appropriately. The wounds on the right lower extremity and the left lower extremity have minimal slough and will continue to use silver alginate on these. Electronic Signature(s) Signed: 05/05/2015 11:29:46 AM By: Evlyn Kanner MD, FACS Entered By: Evlyn Kanner on 05/05/2015 11:29:46 [name], [name]  ([id number]) -------------------------------------------------------------------------------- Physician Orders Details Patient Name: [name] Date of Service: 05/05/2015 11:30 AM Medical Record Patient Account Number: [id number] Number: [name], RN, BSN, Treating RN: 27-Mar-1913 (81 y.o. [place] Sink Date of Birth/Sex: Female) Other Clinician: Primary Care Physician: PATIENT, NO Treating Taffie Eckmann Referring Physician: Marisue Ivan Physician/Extender: Weeks in Treatment: 9 Verbal / Phone Orders: Yes Clinician: [name], RN, BSN, Rita Read Back and Verified: Yes Diagnosis Coding Wound Cleansing Wound #1 Left,Anterior Lower Leg o Cleanse wound with mild soap and water o May Shower, gently pat wound dry prior to applying new dressing. Wound #3 Right,Midline,Anterior Lower Leg o Cleanse wound with mild soap and water o May Shower, gently pat wound dry prior to applying new dressing. Wound #5 Right,Dorsal Foot o Cleanse wound with mild soap and water o May Shower, gently pat wound dry prior to applying new dressing. Anesthetic Wound #1 Left,Anterior Lower Leg o Topical Lidocaine 4% cream applied to wound bed prior to debridement Wound #3 Right,Midline,Anterior Lower Leg o Topical Lidocaine 4% cream applied to wound bed prior to debridement Wound #5 Right,Dorsal Foot o Topical Lidocaine 4% cream applied to wound bed prior to debridement Primary Wound Dressing Wound #1 Left,Anterior Lower Leg o Aquacel Ag Wound #3 Right,Midline,Anterior Lower Leg o Aquacel Ag Wound #5 Right,Dorsal Foot o Prisma Ag Secondary Dressing Wound #1 Left,Anterior Lower Leg [name], Leah L. ([id number]) o ABD and Kerlix/Conform Wound #3 Right,Midline,Anterior Lower Leg o ABD and Kerlix/Conform Wound #5 Right,Dorsal Foot o ABD and Kerlix/Conform Dressing Change Frequency Wound #1 Left,Anterior Lower Leg o Change dressing every other day. Wound #3  Right,Midline,Anterior Lower Leg o Change dressing every other day. Wound #5 Right,Dorsal Foot o Change dressing every other day. Follow-up Appointments Wound #1 Left,Anterior Lower Leg o Return Appointment in 1 week. Wound #3 Right,Midline,Anterior Lower Leg o Return Appointment in 1 week. Wound #5 Right,Dorsal Foot o Return Appointment in 1 week. Edema Control Wound #1 Left,Anterior Lower Leg o Tubigrip Wound #3 Right,Midline,Anterior Lower Leg o Tubigrip Wound #5 Right,Dorsal Foot o Tubigrip Electronic Signature(s) Signed: 05/05/2015 11:24:28 AM By: Elpidio Eric BSN, RN  Signed: 05/05/2015 4:23:40 PM By: Evlyn Kanner MD, FACS Entered By: Elpidio Eric on 05/05/2015 11:24:27 [name], [name] ([id number]) -------------------------------------------------------------------------------- Problem List Details Patient Name: [name] Date of Service: 05/05/2015 11:30 AM Medical Record Patient Account Number: [id number] Number: [name], RN, BSN, Treating RN: 1913/03/01 (81 y.o. 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/03/2015 Yes exposed L97.222 Non-pressure chronic ulcer of left calf with fat layer 03/03/2015 Yes exposed I89.0 Lymphedema, not elsewhere classified 03/03/2015 Yes L97.511 Non-pressure chronic ulcer of other part of right foot 04/29/2015 Yes limited to breakdown of skin Inactive Problems Resolved Problems Electronic Signature(s) Signed: 05/05/2015 11:28:32 AM By: Evlyn Kanner MD, FACS Entered By: Evlyn Kanner on 05/05/2015 11:28:31 Seguin, [name] ([id number]) -------------------------------------------------------------------------------- Progress Note  Details Patient Name: [name] Date of Service: 05/05/2015 11:30 AM Medical Record Patient Account Number: [id number] Number: [name], RN, BSN, Treating RN: 08/19/1913 (81 y.o. [place] Sink Date of Birth/Sex: Female) Other Clinician: Primary Care Physician: PATIENT, NO Treating Tawnia Schirm Referring Physician: Marisue Ivan Physician/Extender: Weeks in Treatment: 9 Subjective Chief Complaint Information obtained from Patient Patient seen for complaints of Non-Healing Wound. pleasant 81 year old patient who had a fall and injured both lower extremities on 01/31/2015 History of Present Illness (HPI) The following HPI elements were documented for the patient's wound: Location: both lower extremities Quality: Patient reports experiencing a dull pain to affected area(s). Severity: Patient states wound (s) are getting better. Duration: Patient has had the wound for < 4 weeks prior to presenting for treatment Timing: Pain in wound is Intermittent (comes and goes Context: The wound occurred when the patient had a fall and lacerated both lower extremities. Modifying Factors: Other treatment(s) tried include:she had sutures applied there for a while and when they were removed the wound opened up. Associated Signs and Symptoms: Patient reports having difficulty standing for long periods. 81 year old female who fell and had an injury to her right forehead, right elbow and both shins on 01/31/2015. She went to the ER at [facility] and had been treated appropriately with sutures being placed to her right and left lower extremity. Also had a CT scan which showed no intracranial hemorrhage and only a large scalp hematoma on the right frontal region.she was put on Keflex for 5 days. she had also received Bactrim for 14 days.Since then she has been seen by her PCP and he had put her on Augmentin for 14 days and wound culture was obtained. culture reports were reviewed -- she  grew an MRSA sensitive to tetracycline, Bactrim and vancomycin. Her past medical history significant for osteoporosis, hypertension, spinal stenosis, right hip replacement, appendectomy and abdomen hysterectomy for ovarian cancer. She has been seen in the past by the vascular surgery group at [facility] and she has an appointment to see them again. She is known to use lymphedema pumps while she has been in her assisted living home but has not been using them for the last month. She does also have compression stockings which she uses intermittently. 03/17/2015 -- She was seen by Dr. Gilda Crease on 03/03/2015 -- he reviewed the patient s case and recommended no surgical intervention. He has recommended compression stockings of the 20-30 mmHg variety and also recommended lymphedema pumps. He will see her back in 3 months. The patient has  developed some blisters on both lower extremities and this may be due to the fact that [name], Leah L. ([id number]) she's had a Kerlix gauze wrap on both lower extremities and is also using the lymphedema pumps. 05/09/2015 -- she has a new wound on the dorsum of the right foot just proximal to the toes but this was probably a blister which opened out and is very superficial. Objective Constitutional Pulse regular. Respirations normal and unlabored. Afebrile. Vitals Time Taken: 11:08 AM, Height: 64 in, Weight: 147 lbs, BMI: 25.2, Temperature: 97.9 F, Pulse: 74 bpm, Respiratory Rate: 16 breaths/min, Blood Pressure: 124/58 mmHg. Eyes Nonicteric. Reactive to light. Ears, Nose, Mouth, and Throat Lips, teeth, and gums WNL.Marland Kitchen Moist mucosa without lesions . Neck supple and nontender. No palpable supraclavicular or cervical adenopathy. Normal sized without goiter. Respiratory WNL. No retractions.. Cardiovascular Pedal Pulses WNL. she continues to have +1 pitting edema both lower extremities. Lymphatic No adneopathy. No adenopathy. No  adenopathy. Musculoskeletal Adexa without tenderness or enlargement.. Digits and nails w/o clubbing, cyanosis, infection, petechiae, ischemia, or inflammatory conditions.Marland Kitchen Psychiatric Judgement and insight Intact.. No evidence of depression, anxiety, or agitation.. General Notes: The wound on her right forefoot looks very clean and we will continue to dress it appropriately. The wounds on the right lower extremity and the left lower extremity have minimal slough and will continue to use silver alginate on these. Integumentary (Hair, Skin) No suspicious lesions. No crepitus or fluctuance. No peri-wound warmth or erythema. No masses.Marland Kitchen [name], Leah L. ([id number]) Wound #1 status is Open. Original cause of wound was Trauma. The wound is located on the Left,Anterior Lower Leg. The wound measures 6cm length x 2.5cm width x 0.1cm depth; 11.781cm^2 area and 1.178cm^3 volume. The wound is limited to skin breakdown. There is no tunneling or undermining noted. There is a medium amount of serosanguineous drainage noted. The wound margin is distinct with the outline attached to the wound base. There is medium (34-66%) pink, pale granulation within the wound bed. There is a medium (34-66%) amount of necrotic tissue within the wound bed including Eschar and Adherent Slough. The periwound skin appearance exhibited: Localized Edema, Moist. The periwound skin appearance did not exhibit: Callus, Crepitus, Excoriation, Fluctuance, Friable, Induration, Rash, Scarring, Dry/Scaly, Maceration, Atrophie Blanche, Cyanosis, Ecchymosis, Hemosiderin Staining, Mottled, Pallor, Rubor, Erythema. Periwound temperature was noted as No Abnormality. The periwound has tenderness on palpation. Wound #3 status is Open. Original cause of wound was Blister. The wound is located on the Right,Midline,Anterior Lower Leg. The wound measures 3cm length x 1.5cm width x 0.2cm depth; 3.534cm^2 area and 0.707cm^3 volume. The wound is  limited to skin breakdown. There is no tunneling or undermining noted. There is a small amount of serosanguineous drainage noted. The wound margin is indistinct and nonvisible. There is medium (34-66%) pink, pale granulation within the wound bed. There is a small (1-33%) amount of necrotic tissue within the wound bed including Adherent Slough. The periwound skin appearance exhibited: Localized Edema, Moist. The periwound skin appearance did not exhibit: Callus, Crepitus, Excoriation, Fluctuance, Friable, Induration, Rash, Scarring, Dry/Scaly, Maceration, Atrophie Blanche, Cyanosis, Ecchymosis, Hemosiderin Staining, Mottled, Pallor, Rubor, Erythema. Periwound temperature was noted as No Abnormality. The periwound has tenderness on palpation. Wound #5 status is Open. Original cause of wound was Blister. The wound is located on the Right,Dorsal Foot. The wound measures 1cm length x 1.4cm width x 0.1cm depth; 1.1cm^2 area and 0.11cm^3 volume. The wound is limited to skin breakdown. There is no tunneling or undermining noted. There  is a medium amount of serous drainage noted. The wound margin is flat and intact. There is large (67-100%) red granulation within the wound bed. There is no necrotic tissue within the wound bed. The periwound skin appearance exhibited: Moist. The periwound skin appearance did not exhibit: Callus, Crepitus, Excoriation, Fluctuance, Friable, Induration, Localized Edema, Rash, Scarring, Dry/Scaly, Maceration, Atrophie Blanche, Cyanosis, Ecchymosis, Hemosiderin Staining, Mottled, Pallor, Rubor, Erythema. Periwound temperature was noted as No Abnormality. Assessment Active Problems ICD-10 L97.212 - Non-pressure chronic ulcer of right calf with fat layer exposed L97.222 - Non-pressure chronic ulcer of left calf with fat layer exposed I89.0 - Lymphedema, not elsewhere classified L97.511 - Non-pressure chronic ulcer of other part of right foot limited to breakdown of  skin [name], Leah L. ([id number]) She will continue to use silver alginate on alternate days on her 2 lower extremities and Prisma AG on her right foot. She will continue to use the lymphedema pump once a day. She will come back to see me next week. Plan Wound Cleansing: Wound #1 Left,Anterior Lower Leg: Cleanse wound with mild soap and water May Shower, gently pat wound dry prior to applying new dressing. Wound #3 Right,Midline,Anterior Lower Leg: Cleanse wound with mild soap and water May Shower, gently pat wound dry prior to applying new dressing. Wound #5 Right,Dorsal Foot: Cleanse wound with mild soap and water May Shower, gently pat wound dry prior to applying new dressing. Anesthetic: Wound #1 Left,Anterior Lower Leg: Topical Lidocaine 4% cream applied to wound bed prior to debridement Wound #3 Right,Midline,Anterior Lower Leg: Topical Lidocaine 4% cream applied to wound bed prior to debridement Wound #5 Right,Dorsal Foot: Topical Lidocaine 4% cream applied to wound bed prior to debridement Primary Wound Dressing: Wound #1 Left,Anterior Lower Leg: Aquacel Ag Wound #3 Right,Midline,Anterior Lower Leg: Aquacel Ag Wound #5 Right,Dorsal Foot: Prisma Ag Secondary Dressing: Wound #1 Left,Anterior Lower Leg: ABD and Kerlix/Conform Wound #3 Right,Midline,Anterior Lower Leg: ABD and Kerlix/Conform Wound #5 Right,Dorsal Foot: ABD and Kerlix/Conform Dressing Change Frequency: Wound #1 Left,Anterior Lower Leg: Change dressing every other day. Wound #3 Right,Midline,Anterior Lower Leg: Change dressing every other day. Wound #5 Right,Dorsal Foot: Change dressing every other day. Follow-up Appointments: CHRISTIE, VISCOMI ([id number]) Wound #1 Left,Anterior Lower Leg: Return Appointment in 1 week. Wound #3 Right,Midline,Anterior Lower Leg: Return Appointment in 1 week. Wound #5 Right,Dorsal Foot: Return Appointment in 1 week. Edema Control: Wound #1 Left,Anterior Lower  Leg: Tubigrip Wound #3 Right,Midline,Anterior Lower Leg: Tubigrip Wound #5 Right,Dorsal Foot: Tubigrip She will continue to use silver alginate on alternate days on her 2 lower extremities and Prisma AG on her right foot. She will continue to use the lymphedema pump once a day. She will come back to see me next week. Electronic Signature(s) Signed: 05/05/2015 11:30:39 AM By: Evlyn Kanner MD, FACS Entered By: Evlyn Kanner on 05/05/2015 11:30:39 [name], [name] ([id number]) -------------------------------------------------------------------------------- SuperBill Details Patient Name: [name] Date of Service: 05/05/2015 Medical Record Patient Account Number: [id number] Number: [name], RN, BSN, Treating RN: 1913/06/24 (81 y.o.  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 I89.0 Lymphedema, not elsewhere classified L97.511 Non-pressure chronic ulcer of other part of right foot limited to breakdown of skin Facility Procedures CPT4 Code: [id number] Description: 99213 - WOUND  CARE VISIT-LEV 3 EST PT Modifier: Quantity: 1 Physician Procedures CPT4: Description Modifier Quantity Code [id number] 99213 - WC PHYS LEVEL 3 - EST PT 1 ICD-10 Description Diagnosis L97.212 Non-pressure chronic ulcer of right calf with fat layer exposed L97.222 Non-pressure chronic ulcer of left calf with fat layer  exposed I89.0 Lymphedema, not elsewhere classified L97.511 Non-pressure chronic ulcer of other part of right foot limited to breakdown of skin Electronic Signature(s) Signed: 05/05/2015 4:48:50 PM By: Elpidio Eric BSN, RN Previous Signature: 05/05/2015 11:30:57  AM Version By: Evlyn Kanner MD, FACS Entered By: Elpidio Eric on 05/05/2015 16:48:50

## 2015-05-12 ENCOUNTER — Encounter: Payer: Medicare Other | Admitting: Surgery

## 2015-05-12 DIAGNOSIS — L97212 Non-pressure chronic ulcer of right calf with fat layer exposed: Secondary | ICD-10-CM | POA: Diagnosis not present

## 2015-05-12 NOTE — Progress Notes (Addendum)
VICTORIOUS, COSIO ([id number]) Visit Report for 05/12/2015 Arrival Information Details Patient Name: [name], [name] Date of Service: 05/12/2015 11:30 AM Medical Record Number: [id number] Patient Account Number: [id number] Date of Birth/Sex: [birth year] (81 y.o. Female) Treating RN: [name], RN, BSN, [employer] Primary Care Physician: PATIENT, NO Other Clinician: Referring Physician: Marisue Ivan Treating Physician/Extender: Rudene Re in Treatment: 10 Visit Information History Since Last Visit Any new allergies or adverse reactions: No Patient Arrived: Wheel Chair Had a fall or experienced change in No Arrival Time: 11:33 activities of daily living that may affect Accompanied By: caregiver risk of falls: Transfer Assistance: EasyPivot Has Dressing in Place as Prescribed: Yes Patient Lift Pain Present Now: No Patient Identification Verified: Yes Secondary Verification Process Yes Completed: Patient Requires Transmission- No Based Precautions: Patient Has Alerts: Yes Electronic Signature(s) Signed: 05/12/2015 11:34:10 AM By: Elpidio Eric BSN, RN Entered By: Elpidio Eric on 05/12/2015 11:34:10 Hiltz, [name] ([id number]) -------------------------------------------------------------------------------- Clinic Level of Care Assessment Details Patient Name: [name] Date of Service: 05/12/2015 11:30 AM Medical Record Number: [id number] Patient Account Number: [id number] Date of Birth/Sex: 03/02/1913 (81 y.o. Female) Treating RN: [name], RN, BSN, Psychologist, clinical Primary Care Physician: PATIENT, NO Other Clinician: Referring Physician: Marisue Ivan Treating Physician/Extender: Rudene Re in Treatment: 10 Clinic Level of Care Assessment Items TOOL 4 Quantity Score []  - Use when only an EandM is performed on FOLLOW-UP visit 0 ASSESSMENTS - Nursing Assessment / Reassessment X - Reassessment of Co-morbidities (includes updates in patient status) 1 10 X - Reassessment  of Adherence to Treatment Plan 1 5 ASSESSMENTS - Wound and Skin Assessment / Reassessment []  - Simple Wound Assessment / Reassessment - one wound 0 X - Complex Wound Assessment / Reassessment - multiple wounds 2 5 []  - Dermatologic / Skin Assessment (not related to wound area) 0 ASSESSMENTS - Focused Assessment X - Circumferential Edema Measurements - multi extremities 2 5 []  - Nutritional Assessment / Counseling / Intervention 0 X - Lower Extremity Assessment (monofilament, tuning fork, pulses) 1 5 []  - Peripheral Arterial Disease Assessment (using hand held doppler) 0 ASSESSMENTS - Ostomy and/or Continence Assessment and Care []  - Incontinence Assessment and Management 0 []  - Ostomy Care Assessment and Management (repouching, etc.) 0 PROCESS - Coordination of Care X - Simple Patient / Family Education for ongoing care 1 15 []  - Complex (extensive) Patient / Family Education for ongoing care 0 []  - Staff obtains Chiropractor, Records, Test Results / Process Orders 0 []  - Staff telephones HHA, Nursing Homes / Clarify orders / etc 0 []  - Routine Transfer to another Facility (non-emergent condition) 0 Grindstaff, Amberlee L. ([id number]) []  - Routine Hospital Admission (non-emergent condition) 0 []  - New Admissions / Manufacturing engineer / Ordering NPWT, Apligraf, etc. 0 []  - Emergency Hospital Admission (emergent condition) 0 []  - Simple Discharge Coordination 0 []  - Complex (extensive) Discharge Coordination 0 PROCESS - Special Needs []  - Pediatric / Minor Patient Management 0 []  - Isolation Patient Management 0 []  - Hearing / Language / Visual special needs 0 []  - Assessment of Community assistance (transportation, D/C planning, etc.) 0 []  - Additional assistance / Altered mentation 0 []  - Support Surface(s) Assessment (bed, cushion, seat, etc.) 0 INTERVENTIONS - Wound Cleansing / Measurement []  - Simple Wound Cleansing - one wound 0 X - Complex Wound Cleansing - multiple wounds 2 5 X -  Wound Imaging (photographs - any number of wounds) 1 5 []  - Wound Tracing (instead of photographs) 0 []  - Simple Wound  Measurement - one wound 0 X - Complex Wound Measurement - multiple wounds 2 5 INTERVENTIONS - Wound Dressings X - Small Wound Dressing one or multiple wounds 2 10 []  - Medium Wound Dressing one or multiple wounds 0 []  - Large Wound Dressing one or multiple wounds 0 []  - Application of Medications - topical 0 []  - Application of Medications - injection 0 INTERVENTIONS - Miscellaneous []  - External ear exam 0 Goldfarb, Ripley L. ([id number]) []  - Specimen Collection (cultures, biopsies, blood, body fluids, etc.) 0 []  - Specimen(s) / Culture(s) sent or taken to Lab for analysis 0 []  - Patient Transfer (multiple staff / Michiel Sites Lift / Similar devices) 0 []  - Simple Staple / Suture removal (25 or less) 0 []  - Complex Staple / Suture removal (26 or more) 0 []  - Hypo / Hyperglycemic Management (close monitor of Blood Glucose) 0 []  - Ankle / Brachial Index (ABI) - do not check if billed separately 0 X - Vital Signs 1 5 Has the patient been seen at the hospital within the last three years: Yes Total Score: 105 Level Of Care: New/Established - Level 3 Electronic Signature(s) Signed: 05/12/2015 12:07:26 PM By: Elpidio Eric BSN, RN Entered By: Elpidio Eric on 05/12/2015 12:07:25 Neider, [name] ([id number]) -------------------------------------------------------------------------------- Encounter Discharge Information Details Patient Name: [name] Date of Service: 05/12/2015 11:30 AM Medical Record Number: [id number] Patient Account Number: [id number] Date of Birth/Sex: Jun 18, 1913 (81 y.o. Female) Treating RN: Clover Mealy, RN, BSN, [place] [employer] Primary Care Physician: PATIENT, NO Other Clinician: Referring Physician: Marisue Ivan Treating Physician/Extender: Rudene Re in Treatment: 10 Encounter Discharge Information Items Discharge Pain Level: 0 Discharge Condition:  Stable Ambulatory Status: Wheelchair Discharge Destination: Nursing Home Transportation: Other Accompanied By: caregiver Schedule Follow-up Appointment: No Medication Reconciliation completed and provided to Patient/Care No Reyana Leisey: Provided on Clinical Summary of Care: 05/12/2015 Form Type Recipient Paper Patient NP Electronic Signature(s) Signed: 05/12/2015 12:20:23 PM By: Elpidio Eric BSN, RN Previous Signature: 05/12/2015 12:13:59 PM Version By: Gwenlyn Perking Entered By: Elpidio Eric on 05/12/2015 12:20:23 Monarch, [name] ([id number]) -------------------------------------------------------------------------------- Lower Extremity Assessment Details Patient Name: [name] Date of Service: 05/12/2015 11:30 AM Medical Record Number: [id number] Patient Account Number: [id number] Date of Birth/Sex: 1913-05-16 (81 y.o. Female) Treating RN: [name], RN, BSN, Psychologist, clinical Primary Care Physician: PATIENT, NO Other Clinician: Referring Physician: Marisue Ivan Treating Physician/Extender: Rudene Re in Treatment: 10 Edema Assessment Assessed: [Left: No] [Right: No] Edema: [Left: Yes] [Right: Yes] Calf Left: Right: Point of Measurement: 35 cm From Medial Instep 34.6 cm 34.6 cm Ankle Left: Right: Point of Measurement: 8 cm From Medial Instep 25.2 cm 25.6 cm Vascular Assessment Claudication: Claudication Assessment [Left:None] [Right:None] Pulses: Posterior Tibial Dorsalis Pedis Palpable: [Left:Yes] [Right:Yes] Extremity colors, hair growth, and conditions: Extremity Color: [Left:Mottled] [Right:Mottled] Hair Growth on Extremity: [Left:No] [Right:No] Temperature of Extremity: [Left:Warm] [Right:Warm] Capillary Refill: [Left:< 3 seconds] [Right:< 3 seconds] Electronic Signature(s) Signed: 05/12/2015 11:35:38 AM By: Elpidio Eric BSN, RN Entered By: Elpidio Eric on 05/12/2015 11:35:38 Larabee, [name]  ([id number]) -------------------------------------------------------------------------------- Multi Wound Chart Details Patient Name: [name] Date of Service: 05/12/2015 11:30 AM Medical Record Number: [id number] Patient Account Number: [id number] Date of Birth/Sex: 1913-02-08 (81 y.o. Female) Treating RN: Clover Mealy, RN, BSN, [employer] Primary Care Physician: PATIENT, NO Other Clinician: Referring Physician: Marisue Ivan Treating Physician/Extender: Rudene Re in Treatment: 10 Vital Signs Height(in): 64 Pulse(bpm): 67 Weight(lbs): 147 Blood Pressure 125/47 (mmHg): Body Mass Index(BMI): 25 Temperature(F): 97.6 Respiratory Rate 18 (breaths/min): Photos: [1:No Photos] [  3:No Photos] [5:No Photos] Wound Location: [1:Left Lower Leg - Anterior Right Lower Leg - Midline, Right Foot - Dorsal] [3:Anterior] Wounding Event: [1:Trauma] [3:Blister] [5:Blister] Primary Etiology: [1:Skin Tear] [3:Lymphedema] [5:To be determined] Comorbid History: [1:Hypertension, Osteoarthritis] [3:Hypertension, Osteoarthritis] [5:Hypertension, Osteoarthritis] Date Acquired: [1:01/31/2015] [3:03/14/2015] [5:04/29/2015] Weeks of Treatment: [1:10] [3:8] [5:1] Wound Status: [1:Open] [3:Open] [5:Open] Measurements L x W x D 6x3x0.1 [3:3x1.5x0.2] [5:0.3x0.8x0.1] (cm) Area (cm) : [1:14.137] [3:3.534] [5:0.188] Volume (cm) : [1:1.414] [3:0.707] [5:0.019] % Reduction in Area: [1:60.50%] [3:25.00%] [5:89.40%] % Reduction in Volume: 86.80% [3:-50.10%] [5:89.30%] Classification: [1:Full Thickness Without Exposed Support Structures] [3:Partial Thickness] [5:Partial Thickness] Exudate Amount: [1:Medium] [3:Small] [5:Medium] Exudate Type: [1:Serosanguineous] [3:Serosanguineous] [5:Serous] Exudate Color: [1:red, brown] [3:red, brown] [5:amber] Wound Margin: [1:Distinct, outline attached Indistinct, nonvisible] [5:Flat and Intact] Granulation Amount: [1:Medium (34-66%)] [3:Medium (34-66%)] [5:Large  (67-100%)] Granulation Quality: [1:Pink, Pale] [3:Pink, Pale] [5:Red] Necrotic Amount: [1:Medium (34-66%)] [3:Small (1-33%)] [5:None Present (0%)] Necrotic Tissue: [1:Eschar, Adherent Slough Adherent Slough] [5:N/A] Exposed Structures: [1:Fascia: No Fat: No Tendon: No] [3:Fascia: No Fat: No Tendon: No] [5:Fascia: No Fat: No Tendon: No] Muscle: No Muscle: No Muscle: No Joint: No Joint: No Joint: No Bone: No Bone: No Bone: No Limited to Skin Limited to Skin Limited to Skin Breakdown Breakdown Breakdown Epithelialization: Small (1-33%) Large (67-100%) Small (1-33%) Periwound Skin Texture: Edema: Yes Edema: Yes Edema: No Excoriation: No Excoriation: No Excoriation: No Induration: No Induration: No Induration: No Callus: No Callus: No Callus: No Crepitus: No Crepitus: No Crepitus: No Fluctuance: No Fluctuance: No Fluctuance: No Friable: No Friable: No Friable: No Rash: No Rash: No Rash: No Scarring: No Scarring: No Scarring: No Periwound Skin Moist: Yes Moist: Yes Moist: Yes Moisture: Maceration: No Maceration: No Maceration: No Dry/Scaly: No Dry/Scaly: No Dry/Scaly: No Periwound Skin Color: Atrophie Blanche: No Atrophie Blanche: No Atrophie Blanche: No Cyanosis: No Cyanosis: No Cyanosis: No Ecchymosis: No Ecchymosis: No Ecchymosis: No Erythema: No Erythema: No Erythema: No Hemosiderin Staining: No Hemosiderin Staining: No Hemosiderin Staining: No Mottled: No Mottled: No Mottled: No Pallor: No Pallor: No Pallor: No Rubor: No Rubor: No Rubor: No Temperature: No Abnormality No Abnormality No Abnormality Tenderness on Yes Yes No Palpation: Wound Preparation: Ulcer Cleansing: Ulcer Cleansing: Ulcer Cleansing: Rinsed/Irrigated with Rinsed/Irrigated with Rinsed/Irrigated with Saline Saline Saline Topical Anesthetic Topical Anesthetic Topical Anesthetic Applied: Other: Lidocaine Applied: Other: lidocaine Applied: None 4% 4% Treatment  Notes Electronic Signature(s) Signed: 05/12/2015 11:56:24 AM By: Elpidio Eric BSN, RN Entered By: Elpidio Eric on 05/12/2015 11:56:24 Kirstein, [name] ([id number]) -------------------------------------------------------------------------------- Multi-Disciplinary Care Plan Details Patient Name: [name] Date of Service: 05/12/2015 11:30 AM Medical Record Number: [id number] Patient Account Number: [id number] Date of Birth/Sex: [birth year] (81 y.o. Female) Treating RN: [name], RN, BSN, [employer] Primary Care Physician: PATIENT, NO Other Clinician: Referring Physician: Marisue Ivan Treating Physician/Extender: Rudene Re in Treatment: 10 Active Inactive Abuse / Safety / Falls / Self Care Management Nursing Diagnoses: Impaired home maintenance Impaired physical mobility Potential for falls Self care deficit: actual or potential Goals: Patient will remain injury free Date Initiated: 03/03/2015 Goal Status: Active Patient/caregiver will verbalize understanding of skin care regimen Date Initiated: 03/03/2015 Goal Status: Active Patient/caregiver will verbalize/demonstrate measure taken to improve self care Date Initiated: 03/03/2015 Goal Status: Active Patient/caregiver will verbalize/demonstrate measures taken to improve the patient's personal safety Date Initiated: 03/03/2015 Goal Status: Active Patient/caregiver will verbalize/demonstrate measures taken to prevent injury and/or falls Date Initiated: 03/03/2015 Goal Status: Active Patient/caregiver will verbalize/demonstrate understanding of what to do in case of emergency Date Initiated:  03/03/2015 Goal Status: Active Interventions: Assess: immobility, friction, shearing, incontinence upon admission and as needed Assess impairment of mobility on admission and as needed per policy Provide education on basic hygiene Provide education on personal and home safety Provide education on safe transfers LAURIANA, DENES  ([id number]) Treatment Activities: Education provided on Basic Hygiene : 03/10/2015 Notes: Orientation to the Wound Care Program Nursing Diagnoses: Knowledge deficit related to the wound healing center program Goals: Patient/caregiver will verbalize understanding of the Wound Healing Center Program Date Initiated: 03/03/2015 Goal Status: Active Interventions: Provide education on orientation to the wound center Notes: Venous Leg Ulcer Nursing Diagnoses: Knowledge deficit related to disease process and management Potential for venous Insuffiency (use before diagnosis confirmed) Goals: Patient will maintain optimal edema control Date Initiated: 03/03/2015 Goal Status: Active Patient/caregiver will verbalize understanding of disease process and disease management Date Initiated: 03/03/2015 Goal Status: Active Verify adequate tissue perfusion prior to therapeutic compression application Date Initiated: 03/03/2015 Goal Status: Active Interventions: Assess peripheral edema status every visit. Compression as ordered Provide education on venous insufficiency Treatment Activities: Test ordered outside of clinic : 05/12/2015 Notes: ANGELMARIE, PONZO ([id number]) Wound/Skin Impairment Nursing Diagnoses: Impaired tissue integrity Knowledge deficit related to ulceration/compromised skin integrity Goals: Patient/caregiver will verbalize understanding of skin care regimen Date Initiated: 03/03/2015 Goal Status: Active Ulcer/skin breakdown will have a volume reduction of 30% by week 4 Date Initiated: 03/03/2015 Goal Status: Active Ulcer/skin breakdown will have a volume reduction of 50% by week 8 Date Initiated: 03/03/2015 Goal Status: Active Ulcer/skin breakdown will have a volume reduction of 80% by week 12 Date Initiated: 03/03/2015 Goal Status: Active Ulcer/skin breakdown will heal within 14 weeks Date Initiated: 03/03/2015 Goal Status: Active Interventions: Assess patient/caregiver ability to  perform ulcer/skin care regimen upon admission and as needed Assess ulceration(s) every visit Provide education on ulcer and skin care Notes: Electronic Signature(s) Signed: 05/12/2015 11:56:15 AM By: Elpidio Eric BSN, RN Entered By: Elpidio Eric on 05/12/2015 11:56:14 Barringer, [name] ([id number]) -------------------------------------------------------------------------------- Pain Assessment Details Patient Name: [name] Date of Service: 05/12/2015 11:30 AM Medical Record Number: [id number] Patient Account Number: [id number] Date of Birth/Sex: [birth year] (81 y.o. Female) Treating RN: Clover Mealy, RN, BSN, [place] [employer] Primary Care Physician: PATIENT, NO Other Clinician: Referring Physician: Marisue Ivan Treating Physician/Extender: Rudene Re in Treatment: 10 Active Problems Location of Pain Severity and Description of Pain Patient Has Paino No Site Locations Pain Management and Medication Current Pain Management: Electronic Signature(s) Signed: 05/12/2015 11:34:20 AM By: Elpidio Eric BSN, RN Entered By: Elpidio Eric on 05/12/2015 11:34:20 Phebus, [name] ([id number]) -------------------------------------------------------------------------------- Patient/Caregiver Education Details Patient Name: [name] Date of Service: 05/12/2015 11:30 AM Medical Record Number: [id number] Patient Account Number: [id number] Date of Birth/Gender: [birth year] (81 y.o. Female) Treating RN: Clover Mealy, RN, BSN, [employer] Primary Care Physician: PATIENT, NO Other Clinician: Referring Physician: Marisue Ivan Treating Physician/Extender: Rudene Re in Treatment: 10 Education Assessment Education Provided To: Patient Education Topics Provided Basic Hygiene: Methods: Explain/Verbal Responses: State content correctly Safety: Methods: Explain/Verbal Responses: State content correctly Venous: Methods: Explain/Verbal Responses: State content correctly Welcome To The Wound Care  Center: Methods: Explain/Verbal Responses: State content correctly Wound/Skin Impairment: Methods: Explain/Verbal Responses: State content correctly Electronic Signature(s) Signed: 05/12/2015 12:20:50 PM By: Elpidio Eric BSN, RN Entered By: Elpidio Eric on 05/12/2015 12:20:49 Dombeck, [name] ([id number]) -------------------------------------------------------------------------------- Wound Assessment Details Patient Name: [name] Date of Service: 05/12/2015 11:30 AM Medical Record Number: [id number] Patient Account Number: [id number] Date of Birth/Sex: Jul 17, 1913 (81 y.o. Female)  Treating RN: [name], RN, BSN, [employer] Primary Care Physician: PATIENT, NO Other Clinician: Referring Physician: Marisue Ivan Treating Physician/Extender: Rudene Re in Treatment: 10 Wound Status Wound Number: 1 Primary Etiology: Skin Tear Wound Location: Left Lower Leg - Anterior Wound Status: Open Wounding Event: Trauma Comorbid History: Hypertension, Osteoarthritis Date Acquired: 01/31/2015 Weeks Of Treatment: 10 Clustered Wound: No Photos Photo Uploaded By: Elpidio Eric on 05/12/2015 17:23:07 Wound Measurements Length: (cm) 6 Width: (cm) 3 Depth: (cm) 0.1 Area: (cm) 14.137 Volume: (cm) 1.414 % Reduction in Area: 60.5% % Reduction in Volume: 86.8% Epithelialization: Small (1-33%) Tunneling: No Undermining: No Wound Description Full Thickness Without Exposed Classification: Support Structures Wound Margin: Distinct, outline attached Exudate Medium Amount: Exudate Type: Serosanguineous Exudate Color: red, brown Foul Odor After Cleansing: No Wound Bed Granulation Amount: Medium (34-66%) Exposed Structure Granulation Quality: Pink, Pale Fascia Exposed: No Necrotic Amount: Medium (34-66%) Fat Layer Exposed: No Schelling, Karena L. ([id number]) Necrotic Quality: Eschar, Adherent Slough Tendon Exposed: No Muscle Exposed: No Joint Exposed: No Bone Exposed: No Limited  to Skin Breakdown Periwound Skin Texture Texture Color No Abnormalities Noted: No No Abnormalities Noted: No Callus: No Atrophie Blanche: No Crepitus: No Cyanosis: No Excoriation: No Ecchymosis: No Fluctuance: No Erythema: No Friable: No Hemosiderin Staining: No Induration: No Mottled: No Localized Edema: Yes Pallor: No Rash: No Rubor: No Scarring: No Temperature / Pain Moisture Temperature: No Abnormality No Abnormalities Noted: No Tenderness on Palpation: Yes Dry / Scaly: No Maceration: No Moist: Yes Wound Preparation Ulcer Cleansing: Rinsed/Irrigated with Saline Topical Anesthetic Applied: Other: Lidocaine 4%, Treatment Notes Wound #1 (Left, Anterior Lower Leg) 1. Cleansed with: Clean wound with Normal Saline 4. Dressing Applied: Hydrafera Blue 5. Secondary Dressing Applied Gauze and Kerlix/Conform 7. Secured with Tape Tubigrip Electronic Signature(s) Signed: 05/12/2015 11:50:53 AM By: Elpidio Eric BSN, RN Entered By: Elpidio Eric on 05/12/2015 11:50:53 Forbess, [name] ([id number]) -------------------------------------------------------------------------------- Wound Assessment Details Patient Name: [name] Date of Service: 05/12/2015 11:30 AM Medical Record Number: [id number] Patient Account Number: [id number] Date of Birth/Sex: 1913-02-24 (81 y.o. Female) Treating RN: [name], RN, BSN, Psychologist, clinical Primary Care Physician: PATIENT, NO Other Clinician: Referring Physician: Marisue Ivan Treating Physician/Extender: Rudene Re in Treatment: 10 Wound Status Wound Number: 3 Primary Etiology: Lymphedema Wound Location: Right Lower Leg - Midline, Wound Status: Open Anterior Comorbid History: Hypertension, Osteoarthritis Wounding Event: Blister Date Acquired: 03/14/2015 Weeks Of Treatment: 8 Clustered Wound: No Photos Photo Uploaded By: Elpidio Eric on 05/12/2015 17:23:07 Wound Measurements Length: (cm) 3 Width: (cm) 1.5 Depth: (cm)  0.2 Area: (cm) 3.534 Volume: (cm) 0.707 % Reduction in Area: 25% % Reduction in Volume: -50.1% Epithelialization: Large (67-100%) Tunneling: No Undermining: No Wound Description Classification: Partial Thickness Wound Margin: Indistinct, nonvisible Exudate Amount: Small Exudate Type: Serosanguineous Exudate Color: red, brown Foul Odor After Cleansing: No Wound Bed Granulation Amount: Medium (34-66%) Exposed Structure Granulation Quality: Pink, Pale Fascia Exposed: No Necrotic Amount: Small (1-33%) Fat Layer Exposed: No Kolakowski, Tesa L. ([id number]) Necrotic Quality: Adherent Slough Tendon Exposed: No Muscle Exposed: No Joint Exposed: No Bone Exposed: No Limited to Skin Breakdown Periwound Skin Texture Texture Color No Abnormalities Noted: No No Abnormalities Noted: No Callus: No Atrophie Blanche: No Crepitus: No Cyanosis: No Excoriation: No Ecchymosis: No Fluctuance: No Erythema: No Friable: No Hemosiderin Staining: No Induration: No Mottled: No Localized Edema: Yes Pallor: No Rash: No Rubor: No Scarring: No Temperature / Pain Moisture Temperature: No Abnormality No Abnormalities Noted: No Tenderness on Palpation: Yes Dry / Scaly: No Maceration: No  Moist: Yes Wound Preparation Ulcer Cleansing: Rinsed/Irrigated with Saline Topical Anesthetic Applied: Other: lidocaine 4%, Treatment Notes Wound #3 (Right, Midline, Anterior Lower Leg) 1. Cleansed with: Clean wound with Normal Saline 4. Dressing Applied: Hydrafera Blue 5. Secondary Dressing Applied Gauze and Kerlix/Conform 7. Secured with Tape Tubigrip Electronic Signature(s) Signed: 05/12/2015 11:51:14 AM By: Elpidio Eric BSN, RN Entered By: Elpidio Eric on 05/12/2015 11:51:14 Muro, [name] ([id number]) -------------------------------------------------------------------------------- Wound Assessment Details Patient Name: [name] Date of Service: 05/12/2015 11:30 AM Medical Record  Number: [id number] Patient Account Number: [id number] Date of Birth/Sex: 1913/08/01 (81 y.o. Female) Treating RN: Clover Mealy, RN, BSN, [employer] Primary Care Physician: PATIENT, NO Other Clinician: Referring Physician: Marisue Ivan Treating Physician/Extender: Rudene Re in Treatment: 10 Wound Status Wound Number: 5 Primary Etiology: To be determined Wound Location: Right Foot - Dorsal Wound Status: Open Wounding Event: Blister Comorbid History: Hypertension, Osteoarthritis Date Acquired: 04/29/2015 Weeks Of Treatment: 1 Clustered Wound: No Photos Photo Uploaded By: Elpidio Eric on 05/12/2015 17:23:08 Wound Measurements Length: (cm) 0.3 Width: (cm) 0.8 Depth: (cm) 0.1 Area: (cm) 0.188 Volume: (cm) 0.019 % Reduction in Area: 89.4% % Reduction in Volume: 89.3% Epithelialization: Small (1-33%) Tunneling: No Undermining: No Wound Description Classification: Partial Thickness Wound Margin: Flat and Intact Exudate Amount: Medium Exudate Type: Serous Exudate Color: amber Foul Odor After Cleansing: No Wound Bed Granulation Amount: Large (67-100%) Exposed Structure Granulation Quality: Red Fascia Exposed: No Necrotic Amount: None Present (0%) Fat Layer Exposed: No Tendon Exposed: No Alvillar, Lameeka L. ([id number]) Muscle Exposed: No Joint Exposed: No Bone Exposed: No Limited to Skin Breakdown Periwound Skin Texture Texture Color No Abnormalities Noted: No No Abnormalities Noted: No Callus: No Atrophie Blanche: No Crepitus: No Cyanosis: No Excoriation: No Ecchymosis: No Fluctuance: No Erythema: No Friable: No Hemosiderin Staining: No Induration: No Mottled: No Localized Edema: No Pallor: No Rash: No Rubor: No Scarring: No Temperature / Pain Moisture Temperature: No Abnormality No Abnormalities Noted: No Dry / Scaly: No Maceration: No Moist: Yes Wound Preparation Ulcer Cleansing: Rinsed/Irrigated with Saline Topical Anesthetic Applied:  None Electronic Signature(s) Signed: 05/12/2015 5:25:19 PM By: Elpidio Eric BSN, RN Entered By: Elpidio Eric on 05/12/2015 11:42:14 Doolin, [name] -------------------------------------------------------------------------------- Vitals Details Patient Name: [name] Date of Service: 05/12/2015 11:30 AM Medical Record Number: [id number] Patient Account Number: [id number] Date of Birth/Sex: Mar 22, 1913 (81 y.o. Female) Treating RN: [name], RN, BSN, Rita Primary Care Physician: PATIENT, NO Other Clinician: Referring Physician: Marisue Ivan Treating Physician/Extender: Rudene Re in Treatment: 10 Vital Signs Time Taken: 11:35 Temperature (F): 97.6 Height (in): 64 Pulse (bpm): 67 Weight (lbs): 147 Respiratory Rate (breaths/min): 18 Body Mass Index (BMI): 25.2 Blood Pressure (mmHg): 125/47 Reference Range: 80 - 120 mg / dl Electronic Signature(s) Signed: 05/12/2015 11:38:41 AM By: Elpidio Eric BSN, RN Entered By: Elpidio Eric on 05/12/2015 11:38:40

## 2015-05-13 NOTE — Progress Notes (Signed)
ELIOT, BENCIVENGA ([id number]) Visit Report for 05/12/2015 Chief Complaint Document Details Patient Name: [name], [name] 05/12/2015 11:30 Date of Service: AM Medical Record [id number] Number: Patient Account Number: [id number] Feb 19, 1913 (81 y.o. [name], RN, BSN, Date of Birth/Sex: Treating RN: Female) Psychologist, clinical Primary Care Physician: PATIENT, NO Other Clinician: Referring Physician: Marisue Ivan Treating Jeena Arnett Physician/Extender: Weeks in Treatment: 10 Information Obtained from: Patient Chief Complaint Patient seen for complaints of Non-Healing Wound. pleasant 81 year old patient who had a fall and injured both lower extremities on 01/31/2015 Electronic Signature(s) Signed: 05/12/2015 12:02:27 PM By: Evlyn Kanner MD, FACS Entered By: Evlyn Kanner on 05/12/2015 12:02:27 Voiles, [name] ([id number]) -------------------------------------------------------------------------------- HPI Details Patient Name: [name], [name] 05/12/2015 11:30 Date of Service: AM Medical Record [id number] Number: Patient Account Number: [id number] [birth year] (81 y.o. [name], RN, BSN, Date of Birth/Sex: Treating RN: Female) Psychologist, clinical Primary Care Physician: PATIENT, NO Other Clinician: Referring Physician: Marisue Ivan Treating Alysah Carton Physician/Extender: Weeks in Treatment: 10 History of Present Illness Location: both lower extremities Quality: Patient reports experiencing a dull pain to affected area(s). Severity: Patient states wound (s) are getting better. Duration: Patient has had the wound for < 4 weeks prior to presenting for treatment Timing: Pain in wound is Intermittent (comes and goes Context: The wound occurred when the patient had a fall and lacerated both lower extremities. Modifying Factors: Other treatment(s) tried include:she had sutures applied there for a while and when they were removed the wound opened up. Associated Signs and Symptoms: Patient reports  having difficulty standing for long periods. HPI Description: 81 year old female who fell and had an injury to her right forehead, right elbow and both shins on 01/31/2015. She went to the ER at [facility] and had been treated appropriately with sutures being placed to her right and left lower extremity. Also had a CT scan which showed no intracranial hemorrhage and only a large scalp hematoma on the right frontal region.she was put on Keflex for 5 days. she had also received Bactrim for 14 days.Since then she has been seen by her PCP and he had put her on Augmentin for 14 days and wound culture was obtained. culture reports were reviewed -- she grew an MRSA sensitive to tetracycline, Bactrim and vancomycin. Her past medical history significant for osteoporosis, hypertension, spinal stenosis, right hip replacement, appendectomy and abdomen hysterectomy for ovarian cancer. She has been seen in the past by the vascular surgery group at [facility] and she has an appointment to see them again. She is known to use lymphedema pumps while she has been in her assisted living home but has not been using them for the last month. She does also have compression stockings which she uses intermittently. 03/17/2015 -- She was seen by Dr. Gilda Crease on 03/03/2015 -- he reviewed the patientos case and recommended no surgical intervention. He has recommended compression stockings of the 20-30 mmHg variety and also recommended lymphedema pumps. He will see her back in 3 months. The patient has developed some blisters on both lower extremities and this may be due to the fact that she's had a Kerlix gauze wrap on both lower extremities and is also using the lymphedema pumps. 05/09/2015 -- she has a new wound on the dorsum of the right foot just proximal to the toes but this was probably a blister which opened out and is very superficial. Electronic Signature(s) Signed: 05/12/2015 12:02:30 PM By: Evlyn Kanner MD, FACS Entered By: Evlyn Kanner on 05/12/2015 12:02:30 Shreiner, Aniyia L. ([id number])  ARNETIA, BRONK ([id number]) -------------------------------------------------------------------------------- Physical Exam Details Patient Name: [name], [name] 05/12/2015 11:30 Date of Service: AM Medical Record [id number] Number: Patient Account Number: [id number] 20-May-1913 (81 y.o. [name], RN, BSN, Date of Birth/Sex: Treating RN: Female) Psychologist, clinical Primary Care Physician: PATIENT, NO Other Clinician: Referring Physician: Marisue Ivan Treating Nishawn Rotan Physician/Extender: Weeks in Treatment: 10 Constitutional . Pulse regular. Respirations normal and unlabored. Afebrile. . Eyes Nonicteric. Reactive to light. Ears, Nose, Mouth, and Throat Lips, teeth, and gums WNL.Marland Kitchen Moist mucosa without lesions . Neck supple and nontender. No palpable supraclavicular or cervical adenopathy. Normal sized without goiter. Respiratory WNL. No retractions.. Cardiovascular Pedal Pulses WNL. No clubbing, cyanosis or edema. Lymphatic No adneopathy. No adenopathy. No adenopathy. Musculoskeletal Adexa without tenderness or enlargement.. Digits and nails w/o clubbing, cyanosis, infection, petechiae, ischemia, or inflammatory conditions.. Integumentary (Hair, Skin) No suspicious lesions. No crepitus or fluctuance. No peri-wound warmth or erythema. No masses.Marland Kitchen Psychiatric Judgement and insight Intact.. No evidence of depression, anxiety, or agitation.. Notes the wounds are looking much cleaner and nicer and there is good epithelization. There are a few areas which have court abrasions possibly from the lymphedema pumps. Electronic Signature(s) Signed: 05/12/2015 12:03:09 PM By: Evlyn Kanner MD, FACS Entered By: Evlyn Kanner on 05/12/2015 12:03:08 Frane, [name] ([id number]) -------------------------------------------------------------------------------- Physician Orders Details Patient Name:  [name], [name] 05/12/2015 11:30 Date of Service: AM Medical Record [id number] Number: Patient Account Number: [id number] 1912-08-30 (81 y.o. [name], RN, BSN, Date of Birth/Sex: Treating RN: Female) Psychologist, clinical Primary Care Physician: PATIENT, NO Other Clinician: Referring Physician: Marisue Ivan Treating Donnel Venuto Physician/Extender: Weeks in Treatment: 10 Verbal / Phone Orders: Yes Clinician: [name], RN, BSN, Rita Read Back and Verified: Yes Diagnosis Coding Wound Cleansing Wound #1 Left,Anterior Lower Leg o Clean wound with Normal Saline. Wound #3 Right,Midline,Anterior Lower Leg o Clean wound with Normal Saline. Wound #5 Right,Dorsal Foot o Clean wound with Normal Saline. Skin Barriers/Peri-Wound Care Wound #1 Left,Anterior Lower Leg o Moisturizing lotion Wound #3 Right,Midline,Anterior Lower Leg o Moisturizing lotion Wound #5 Right,Dorsal Foot o Moisturizing lotion Primary Wound Dressing Wound #1 Left,Anterior Lower Leg o Hydrafera Blue Wound #3 Right,Midline,Anterior Lower Leg o Hydrafera Blue Wound #5 Right,Dorsal Foot o Prisma Ag Secondary Dressing Wound #1 Left,Anterior Lower Leg o Gauze and Kerlix/Conform Wound #3 Right,Midline,Anterior Lower Leg Nace, Leah L. ([id number]) o Gauze and Kerlix/Conform Wound #5 Right,Dorsal Foot o Gauze and Kerlix/Conform Dressing Change Frequency Wound #1 Left,Anterior Lower Leg o Change dressing every other day. Wound #3 Right,Midline,Anterior Lower Leg o Change dressing every other day. Wound #5 Right,Dorsal Foot o Change dressing every other day. Follow-up Appointments Wound #1 Left,Anterior Lower Leg o Return Appointment in 1 week. Wound #3 Right,Midline,Anterior Lower Leg o Return Appointment in 1 week. Wound #5 Right,Dorsal Foot o Return Appointment in 1 week. Edema Control Wound #1 Left,Anterior Lower Leg o Tubigrip Wound #3 Right,Midline,Anterior Lower Leg o  Tubigrip Wound #5 Right,Dorsal Foot o Tubigrip Home Health Wound #1 Left,Anterior Lower Leg o Continue Home Health Visits - [facility] o Home Health Nurse may visit PRN to address patientos wound care needs. o FACE TO FACE ENCOUNTER: MEDICARE and MEDICAID PATIENTS: I certify that this patient is under my care and that I had a face-to-face encounter that meets the physician face-to-face encounter requirements with this patient on this date. The encounter with the patient was in whole or in part for the following MEDICAL CONDITION: (primary reason for Home Healthcare) MEDICAL NECESSITY: I certify, that based on my findings, NURSING services  are a medically necessary home health service. HOME BOUND STATUS: I certify that my clinical findings support that this patient is homebound (i.e., Due to illness or injury, pt requires aid of supportive devices such as crutches, cane, wheelchairs, walkers, the use of special transportation or the assistance of another person to leave their place of residence. There is a Ibarra, Leah L. ([id number]) normal inability to leave the home and doing so requires considerable and taxing effort. Other absences are for medical reasons / religious services and are infrequent or of short duration when for other reasons). o If current dressing causes regression in wound condition, may D/C ordered dressing product/s and apply Normal Saline Moist Dressing daily until next Wound Healing Center / Other MD appointment. Notify Wound Healing Center of regression in wound condition at [phone]. o Please direct any NON-WOUND related issues/requests for orders to patient's Primary Care Physician Wound #3 Right,Midline,Anterior Lower Leg o Continue Home Health Visits - [facility] o Home Health Nurse may visit PRN to address patientos wound care needs. o FACE TO FACE ENCOUNTER: MEDICARE and MEDICAID PATIENTS: I certify that this patient is under my care and  that I had a face-to-face encounter that meets the physician face-to-face encounter requirements with this patient on this date. The encounter with the patient was in whole or in part for the following MEDICAL CONDITION: (primary reason for Home Healthcare) MEDICAL NECESSITY: I certify, that based on my findings, NURSING services are a medically necessary home health service. HOME BOUND STATUS: I certify that my clinical findings support that this patient is homebound (i.e., Due to illness or injury, pt requires aid of supportive devices such as crutches, cane, wheelchairs, walkers, the use of special transportation or the assistance of another person to leave their place of residence. There is a normal inability to leave the home and doing so requires considerable and taxing effort. Other absences are for medical reasons / religious services and are infrequent or of short duration when for other reasons). o If current dressing causes regression in wound condition, may D/C ordered dressing product/s and apply Normal Saline Moist Dressing daily until next Wound Healing Center / Other MD appointment. Notify Wound Healing Center of regression in wound condition at [phone]. o Please direct any NON-WOUND related issues/requests for orders to patient's Primary Care Physician Wound #5 Right,Dorsal Foot o Continue Home Health Visits - [facility] o Home Health Nurse may visit PRN to address patientos wound care needs. o FACE TO FACE ENCOUNTER: MEDICARE and MEDICAID PATIENTS: I certify that this patient is under my care and that I had a face-to-face encounter that meets the physician face-to-face encounter requirements with this patient on this date. The encounter with the patient was in whole or in part for the following MEDICAL CONDITION: (primary reason for Home Healthcare) MEDICAL NECESSITY: I certify, that based on my findings, NURSING services are a medically necessary home health  service. HOME BOUND STATUS: I certify that my clinical findings support that this patient is homebound (i.e., Due to illness or injury, pt requires aid of supportive devices such as crutches, cane, wheelchairs, walkers, the use of special transportation or the assistance of another person to leave their place of residence. There is a normal inability to leave the home and doing so requires considerable and taxing effort. Other absences are for medical reasons / religious services and are infrequent or of short duration when for other reasons). o If current dressing causes regression in wound condition, may D/C ordered dressing  product/s and apply Normal Saline Moist Dressing daily until next Wound Healing Center / Other MD appointment. Notify Wound Healing Center of regression in wound condition at [phone]. o Please direct any NON-WOUND related issues/requests for orders to patient's Primary Care Physician [name], [name] ([id number]) Electronic Signature(s) Signed: 05/12/2015 11:58:26 AM By: Elpidio Eric BSN, RN Signed: 05/12/2015 4:21:04 PM By: Evlyn Kanner MD, FACS Entered By: Elpidio Eric on 05/12/2015 11:58:25 Santillanes, [name] ([id number]) -------------------------------------------------------------------------------- Problem List Details Patient Name: [name], [name] 05/12/2015 11:30 Date of Service: AM Medical Record [id number] Number: Patient Account Number: [id number] [birth year] (81 y.o. [name], RN, BSN, Date of Birth/Sex: Treating RN: Female) Psychologist, clinical Primary Care Physician: PATIENT, NO Other Clinician: Referring Physician: Clelia Croft, River Mckercher Physician/Extender: Weeks in Treatment: 10 Active Problems ICD-10 Encounter Code Description Active Date Diagnosis L97.212 Non-pressure chronic ulcer of right calf with fat layer 03/03/2015 Yes exposed L97.222 Non-pressure chronic ulcer of left calf with fat layer 03/03/2015 Yes exposed I89.0 Lymphedema,  not elsewhere classified 03/03/2015 Yes L97.511 Non-pressure chronic ulcer of other part of right foot 04/29/2015 Yes limited to breakdown of skin Inactive Problems Resolved Problems Electronic Signature(s) Signed: 05/12/2015 12:02:21 PM By: Evlyn Kanner MD, FACS Entered By: Evlyn Kanner on 05/12/2015 12:02:21 Saulnier, [name] ([id number]) -------------------------------------------------------------------------------- Progress Note Details Patient Name: [name], [name] 05/12/2015 11:30 Date of Service: AM Medical Record [id number] Number: Patient Account Number: [id number] [birth year] (81 y.o. [name], RN, BSN, Date of Birth/Sex: Treating RN: Female) Psychologist, clinical Primary Care Physician: PATIENT, NO Other Clinician: Referring Physician: Marisue Ivan Treating Christabel Camire Physician/Extender: Weeks in Treatment: 10 Subjective Chief Complaint Information obtained from Patient Patient seen for complaints of Non-Healing Wound. pleasant 81 year old patient who had a fall and injured both lower extremities on 01/31/2015 History of Present Illness (HPI) The following HPI elements were documented for the patient's wound: Location: both lower extremities Quality: Patient reports experiencing a dull pain to affected area(s). Severity: Patient states wound (s) are getting better. Duration: Patient has had the wound for < 4 weeks prior to presenting for treatment Timing: Pain in wound is Intermittent (comes and goes Context: The wound occurred when the patient had a fall and lacerated both lower extremities. Modifying Factors: Other treatment(s) tried include:she had sutures applied there for a while and when they were removed the wound opened up. Associated Signs and Symptoms: Patient reports having difficulty standing for long periods. 81 year old female who fell and had an injury to her right forehead, right elbow and both shins on 01/31/2015. She went to the ER at [facility] and had  been treated appropriately with sutures being placed to her right and left lower extremity. Also had a CT scan which showed no intracranial hemorrhage and only a large scalp hematoma on the right frontal region.she was put on Keflex for 5 days. she had also received Bactrim for 14 days.Since then she has been seen by her PCP and he had put her on Augmentin for 14 days and wound culture was obtained. culture reports were reviewed -- she grew an MRSA sensitive to tetracycline, Bactrim and vancomycin. Her past medical history significant for osteoporosis, hypertension, spinal stenosis, right hip replacement, appendectomy and abdomen hysterectomy for ovarian cancer. She has been seen in the past by the vascular surgery group at [facility] and she has an appointment to see them again. She is known to use lymphedema pumps while she has been in her assisted living home but has not been using them for the last month. She  does also have compression stockings which she uses intermittently. 03/17/2015 -- She was seen by Dr. Gilda Crease on 03/03/2015 -- he reviewed the patient s case and recommended no surgical intervention. He has recommended compression stockings of the 20-30 mmHg variety and also recommended lymphedema pumps. He will see her back in 3 months. The patient has developed some blisters on both lower extremities and this may be due to the fact that Laverdure, Leah L. ([id number]) she's had a Kerlix gauze wrap on both lower extremities and is also using the lymphedema pumps. 05/09/2015 -- she has a new wound on the dorsum of the right foot just proximal to the toes but this was probably a blister which opened out and is very superficial. Objective Constitutional Pulse regular. Respirations normal and unlabored. Afebrile. Vitals Time Taken: 11:35 AM, Height: 64 in, Weight: 147 lbs, BMI: 25.2, Temperature: 97.6 F, Pulse: 67 bpm, Respiratory Rate: 18 breaths/min, Blood Pressure: 125/47  mmHg. Eyes Nonicteric. Reactive to light. Ears, Nose, Mouth, and Throat Lips, teeth, and gums WNL.Marland Kitchen Moist mucosa without lesions . Neck supple and nontender. No palpable supraclavicular or cervical adenopathy. Normal sized without goiter. Respiratory WNL. No retractions.. Cardiovascular Pedal Pulses WNL. No clubbing, cyanosis or edema. Lymphatic No adneopathy. No adenopathy. No adenopathy. Musculoskeletal Adexa without tenderness or enlargement.. Digits and nails w/o clubbing, cyanosis, infection, petechiae, ischemia, or inflammatory conditions.Marland Kitchen Psychiatric Judgement and insight Intact.. No evidence of depression, anxiety, or agitation.. General Notes: the wounds are looking much cleaner and nicer and there is good epithelization. There are a few areas which have court abrasions possibly from the lymphedema pumps. Integumentary (Hair, Skin) No suspicious lesions. No crepitus or fluctuance. No peri-wound warmth or erythema. No masses.Marland Kitchen Soliday, Leah L. ([id number]) Wound #1 status is Open. Original cause of wound was Trauma. The wound is located on the Left,Anterior Lower Leg. The wound measures 6cm length x 3cm width x 0.1cm depth; 14.137cm^2 area and 1.414cm^3 volume. The wound is limited to skin breakdown. There is no tunneling or undermining noted. There is a medium amount of serosanguineous drainage noted. The wound margin is distinct with the outline attached to the wound base. There is medium (34-66%) pink, pale granulation within the wound bed. There is a medium (34-66%) amount of necrotic tissue within the wound bed including Eschar and Adherent Slough. The periwound skin appearance exhibited: Localized Edema, Moist. The periwound skin appearance did not exhibit: Callus, Crepitus, Excoriation, Fluctuance, Friable, Induration, Rash, Scarring, Dry/Scaly, Maceration, Atrophie Blanche, Cyanosis, Ecchymosis, Hemosiderin Staining, Mottled, Pallor, Rubor, Erythema. Periwound  temperature was noted as No Abnormality. The periwound has tenderness on palpation. Wound #3 status is Open. Original cause of wound was Blister. The wound is located on the Right,Midline,Anterior Lower Leg. The wound measures 3cm length x 1.5cm width x 0.2cm depth; 3.534cm^2 area and 0.707cm^3 volume. The wound is limited to skin breakdown. There is no tunneling or undermining noted. There is a small amount of serosanguineous drainage noted. The wound margin is indistinct and nonvisible. There is medium (34-66%) pink, pale granulation within the wound bed. There is a small (1-33%) amount of necrotic tissue within the wound bed including Adherent Slough. The periwound skin appearance exhibited: Localized Edema, Moist. The periwound skin appearance did not exhibit: Callus, Crepitus, Excoriation, Fluctuance, Friable, Induration, Rash, Scarring, Dry/Scaly, Maceration, Atrophie Blanche, Cyanosis, Ecchymosis, Hemosiderin Staining, Mottled, Pallor, Rubor, Erythema. Periwound temperature was noted as No Abnormality. The periwound has tenderness on palpation. Wound #5 status is Open. Original cause of wound was Blister. The  wound is located on the Right,Dorsal Foot. The wound measures 0.3cm length x 0.8cm width x 0.1cm depth; 0.188cm^2 area and 0.019cm^3 volume. The wound is limited to skin breakdown. There is no tunneling or undermining noted. There is a medium amount of serous drainage noted. The wound margin is flat and intact. There is large (67-100%) red granulation within the wound bed. There is no necrotic tissue within the wound bed. The periwound skin appearance exhibited: Moist. The periwound skin appearance did not exhibit: Callus, Crepitus, Excoriation, Fluctuance, Friable, Induration, Localized Edema, Rash, Scarring, Dry/Scaly, Maceration, Atrophie Blanche, Cyanosis, Ecchymosis, Hemosiderin Staining, Mottled, Pallor, Rubor, Erythema. Periwound temperature was noted as No  Abnormality. Assessment Active Problems ICD-10 L97.212 - Non-pressure chronic ulcer of right calf with fat layer exposed L97.222 - Non-pressure chronic ulcer of left calf with fat layer exposed I89.0 - Lymphedema, not elsewhere classified L97.511 - Non-pressure chronic ulcer of other part of right foot limited to breakdown of skin Mcelhiney, Leah L. ([id number]) We will change over to [facility] on the large ulcerations and put pieces of Mepitel on the abrasions. The right foot will have Prisma. She will come back and see me next week. Plan Wound Cleansing: Wound #1 Left,Anterior Lower Leg: Clean wound with Normal Saline. Wound #3 Right,Midline,Anterior Lower Leg: Clean wound with Normal Saline. Wound #5 Right,Dorsal Foot: Clean wound with Normal Saline. Skin Barriers/Peri-Wound Care: Wound #1 Left,Anterior Lower Leg: Moisturizing lotion Wound #3 Right,Midline,Anterior Lower Leg: Moisturizing lotion Wound #5 Right,Dorsal Foot: Moisturizing lotion Primary Wound Dressing: Wound #1 Left,Anterior Lower Leg: Hydrafera Blue Wound #3 Right,Midline,Anterior Lower Leg: Hydrafera Blue Wound #5 Right,Dorsal Foot: Prisma Ag Secondary Dressing: Wound #1 Left,Anterior Lower Leg: Gauze and Kerlix/Conform Wound #3 Right,Midline,Anterior Lower Leg: Gauze and Kerlix/Conform Wound #5 Right,Dorsal Foot: Gauze and Kerlix/Conform Dressing Change Frequency: Wound #1 Left,Anterior Lower Leg: Change dressing every other day. Wound #3 Right,Midline,Anterior Lower Leg: Change dressing every other day. Wound #5 Right,Dorsal Foot: Change dressing every other day. Follow-up Appointments: Wound #1 Left,Anterior Lower Leg: Return Appointment in 1 week. Wound #3 Right,Midline,Anterior Lower Leg: Return Appointment in 1 week. Wound #5 Right,Dorsal Foot: Return Appointment in 1 week. Grimm, Leah L. ([id number]) Edema Control: Wound #1 Left,Anterior Lower Leg: Tubigrip Wound #3  Right,Midline,Anterior Lower Leg: Tubigrip Wound #5 Right,Dorsal Foot: Tubigrip Home Health: Wound #1 Left,Anterior Lower Leg: Continue [facility] Health Visits - [facility] Nurse may visit PRN to address patient s wound care needs. FACE TO FACE ENCOUNTER: MEDICARE and MEDICAID PATIENTS: I certify that this patient is under my care and that I had a face-to-face encounter that meets the physician face-to-face encounter requirements with this patient on this date. The encounter with the patient was in whole or in part for the following MEDICAL CONDITION: (primary reason for Home Healthcare) MEDICAL NECESSITY: I certify, that based on my findings, NURSING services are a medically necessary home health service. HOME BOUND STATUS: I certify that my clinical findings support that this patient is homebound (i.e., Due to illness or injury, pt requires aid of supportive devices such as crutches, cane, wheelchairs, walkers, the use of special transportation or the assistance of another person to leave their place of residence. There is a normal inability to leave the home and doing so requires considerable and taxing effort. Other absences are for medical reasons / religious services and are infrequent or of short duration when for other reasons). If current dressing causes regression in wound condition, may D/C ordered dressing product/s and apply Normal Saline Moist  Dressing daily until next Wound Healing Center / Other MD appointment. Notify Wound Healing Center of regression in wound condition at [phone]. Please direct any NON-WOUND related issues/requests for orders to patient's Primary Care Physician Wound #3 Right,Midline,Anterior Lower Leg: Continue Home Health Visits - [facility] Health Nurse may visit PRN to address patient s wound care needs. FACE TO FACE ENCOUNTER: MEDICARE and MEDICAID PATIENTS: I certify that this patient is under my care and that I had a face-to-face  encounter that meets the physician face-to-face encounter requirements with this patient on this date. The encounter with the patient was in whole or in part for the following MEDICAL CONDITION: (primary reason for Home Healthcare) MEDICAL NECESSITY: I certify, that based on my findings, NURSING services are a medically necessary home health service. HOME BOUND STATUS: I certify that my clinical findings support that this patient is homebound (i.e., Due to illness or injury, pt requires aid of supportive devices such as crutches, cane, wheelchairs, walkers, the use of special transportation or the assistance of another person to leave their place of residence. There is a normal inability to leave the home and doing so requires considerable and taxing effort. Other absences are for medical reasons / religious services and are infrequent or of short duration when for other reasons). If current dressing causes regression in wound condition, may D/C ordered dressing product/s and apply Normal Saline Moist Dressing daily until next Wound Healing Center / Other MD appointment. Notify Wound Healing Center of regression in wound condition at [phone]. Please direct any NON-WOUND related issues/requests for orders to patient's Primary Care Physician Wound #5 Right,Dorsal Foot: Continue Home Health Visits - [facility] Nurse may visit PRN to address patient s wound care needs. FACE TO FACE ENCOUNTER: MEDICARE and MEDICAID PATIENTS: I certify that this patient is under my care and that I had a face-to-face encounter that meets the physician face-to-face encounter requirements with this patient on this date. The encounter with the patient was in whole or in part for the following MEDICAL CONDITION: (primary reason for Home Healthcare) MEDICAL NECESSITY: I certify, that based on my findings, NURSING services are a medically necessary home health service. HOME BOUND STATUS: I certify that my  clinical findings support that this patient is homebound (i.e., Due to VALLERIE, HENTZ. ([id number]) illness or injury, pt requires aid of supportive devices such as crutches, cane, wheelchairs, walkers, the use of special transportation or the assistance of another person to leave their place of residence. There is a normal inability to leave the home and doing so requires considerable and taxing effort. Other absences are for medical reasons / religious services and are infrequent or of short duration when for other reasons). If current dressing causes regression in wound condition, may D/C ordered dressing product/s and apply Normal Saline Moist Dressing daily until next Wound Healing Center / Other MD appointment. Notify Wound Healing Center of regression in wound condition at [phone]. Please direct any NON-WOUND related issues/requests for orders to patient's Primary Care Physician We will change over to [facility] on the large ulcerations and put pieces of Mepitel on the abrasions. The right foot will have Prisma. She will come back and see me next week. Electronic Signature(s) Signed: 05/12/2015 12:03:52 PM By: Evlyn Kanner MD, FACS Entered By: Evlyn Kanner on 05/12/2015 12:03:52 Araki, [name] ([id number]) -------------------------------------------------------------------------------- SuperBill Details Patient Name: [name] Date of Service: 05/12/2015 Medical Record Patient Account Number: [id number] Number: [name], RN, BSN, Treating  RN: [birth year] (81 y.o. 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 I89.0  Lymphedema, not elsewhere classified L97.511 Non-pressure chronic ulcer of other part of right foot limited to breakdown of skin Facility Procedures CPT4 Code: [id number] Description: 99213 - WOUND CARE VISIT-LEV 3 EST PT Modifier: Quantity: 1 Physician Procedures CPT4: Description Modifier Quantity Code [id number] 99213 - WC PHYS LEVEL 3 - EST PT 1 ICD-10 Description Diagnosis L97.212 Non-pressure chronic ulcer of right calf with fat layer exposed L97.222 Non-pressure chronic ulcer of left calf with fat layer  exposed I89.0 Lymphedema, not elsewhere classified L97.511 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

## 2015-05-19 ENCOUNTER — Encounter: Payer: Medicare Other | Admitting: Surgery

## 2015-05-19 DIAGNOSIS — L97212 Non-pressure chronic ulcer of right calf with fat layer exposed: Secondary | ICD-10-CM | POA: Diagnosis not present

## 2015-05-19 NOTE — Progress Notes (Addendum)
[name], [name] ([id number]) Visit Report for 05/19/2015 Arrival Information Details Patient Name: [name] Date of Service: 05/19/2015 11:30 AM Medical Record Number: [id number] Patient Account Number: [id number] Date of Birth/Sex: [birth year] (81 y.o. Female) Treating RN: [name], RN, BSN, [employer] Primary Care Physician: PATIENT, NO Other Clinician: Referring Physician: Marisue Ivan Treating Physician/Extender: Rudene Re in Treatment: 11 Visit Information History Since Last Visit Any new allergies or adverse reactions: No Patient Arrived: Wheel Chair Had a fall or experienced change in No activities of daily living that may affect Arrival Time: 11:54 risk of falls: Accompanied By: caregiver Signs or symptoms of abuse/neglect since last No Transfer Assistance: Manual visito Patient Identification Verified: Yes Has Dressing in Place as Prescribed: Yes Secondary Verification Process Yes Pain Present Now: No Completed: Patient Requires Transmission-Based No Precautions: Patient Has Alerts: Yes Electronic Signature(s) Signed: 05/19/2015 11:55:14 AM By: Elpidio Eric BSN, RN Entered By: Elpidio Eric on 05/19/2015 11:55:14 Willeford, [name] ([id number]) -------------------------------------------------------------------------------- Encounter Discharge Information Details Patient Name: [name] Date of Service: 05/19/2015 11:30 AM Medical Record Number: [id number] Patient Account Number: [id number] Date of Birth/Sex: 11-01-1912 (81 y.o. Female) Treating RN: Clover Mealy, RN, BSN, [place] [employer] Primary Care Physician: PATIENT, NO Other Clinician: Referring Physician: Marisue Ivan Treating Physician/Extender: Rudene Re in Treatment: 11 Encounter Discharge Information Items Discharge Pain Level: 0 Discharge Condition: Stable Ambulatory Status: Wheelchair Discharge Destination: Nursing Home Transportation: Private Auto Accompanied By: caregiver Schedule  Follow-up Appointment: No Medication Reconciliation completed and provided to Patient/Care No [name]: Provided on Clinical Summary of Care: 05/19/2015 Form Type Recipient Paper Patient NP Electronic Signature(s) Signed: 05/19/2015 12:30:04 PM By: Gwenlyn Perking Previous Signature: 05/19/2015 12:17:02 PM Version By: Elpidio Eric BSN, RN Entered By: Gwenlyn Perking on 05/19/2015 12:30:04 Salsgiver, [name] ([id number]) -------------------------------------------------------------------------------- Lower Extremity Assessment Details Patient Name: [name] Date of Service: 05/19/2015 11:30 AM Medical Record Number: [id number] Patient Account Number: [id number] Date of Birth/Sex: 12-21-1912 (81 y.o. Female) Treating RN: [name], RN, BSN, [employer] Primary Care Physician: PATIENT, NO Other Clinician: Referring Physician: Marisue Ivan Treating Physician/Extender: Rudene Re in Treatment: 11 Edema Assessment Assessed: [Left: No] [Right: No] E[Left: dema] [Right: :] Calf Left: Right: Point of Measurement: 35 cm From Medial Instep 34.5 cm 34.5 cm Ankle Left: Right: Point of Measurement: 8 cm From Medial Instep 25 cm 25.2 cm Vascular Assessment Pulses: Posterior Tibial Dorsalis Pedis Palpable: [Left:Yes] [Right:Yes] Extremity colors, hair growth, and conditions: Extremity Color: [Left:Mottled] [Right:Mottled] Hair Growth on Extremity: [Left:No] [Right:No] Temperature of Extremity: [Left:Warm] [Right:Warm] Capillary Refill: [Left:< 3 seconds] [Right:< 3 seconds] Electronic Signature(s) Signed: 05/19/2015 11:59:07 AM By: Elpidio Eric BSN, RN Entered By: Elpidio Eric on 05/19/2015 11:59:07 Goedken, [name] ([id number]) -------------------------------------------------------------------------------- Multi Wound Chart Details Patient Name: [name] Date of Service: 05/19/2015 11:30 AM Medical Record Number: [id number] Patient Account Number: [id number] Date of  Birth/Sex: 1913-04-02 (81 y.o. Female) Treating RN: Clover Mealy, RN, BSN, [employer] Primary Care Physician: PATIENT, NO Other Clinician: Referring Physician: Marisue Ivan Treating Physician/Extender: Rudene Re in Treatment: 11 Vital Signs Height(in): 64 Pulse(bpm): 64 Weight(lbs): 147 Blood Pressure 128/57 (mmHg): Body Mass Index(BMI): 25 Temperature(F): 97.8 Respiratory Rate 18 (breaths/min): Photos: [1:No Photos] [3:No Photos] [5:No Photos] Wound Location: [1:Left Lower Leg - Anterior Right Lower Leg - Midline, Right, Dorsal Foot] [3:Anterior] Wounding Event: [1:Trauma] [3:Blister] [5:Blister] Primary Etiology: [1:Skin Tear] [3:Lymphedema] [5:Skin Tear] Comorbid History: [1:Hypertension, Osteoarthritis] [3:Hypertension, Osteoarthritis] [5:N/A] Date Acquired: [1:01/31/2015] [3:03/14/2015] [5:04/29/2015] Weeks of Treatment: [1:11] [3:9] [5:2] Wound Status: [1:Open] [3:Open] [5:Healed -  Epithelialized] Measurements L x W x D 6.5x2.4x0.1 [3:2.5x1.3x0.2] [5:0x0x0] (cm) Area (cm) : [1:12.252] [3:2.553] [5:0] Volume (cm) : [1:1.225] [3:0.511] [5:0] % Reduction in Area: [1:65.80%] [3:45.80%] [5:100.00%] % Reduction in Volume: 88.60% [3:-8.50%] [5:100.00%] Classification: [1:Full Thickness Without Exposed Support Structures] [3:Partial Thickness] [5:Partial Thickness] Exudate Amount: [1:Medium] [3:Small] [5:N/A] Exudate Type: [1:Serosanguineous] [3:Serosanguineous] [5:N/A] Exudate Color: [1:red, brown] [3:red, brown] [5:N/A] Wound Margin: [1:Distinct, outline attached Indistinct, nonvisible] [5:N/A] Granulation Amount: [1:Medium (34-66%)] [3:Medium (34-66%)] [5:N/A] Granulation Quality: [1:Pink, Pale] [3:Pink, Pale] [5:N/A] Necrotic Amount: [1:Medium (34-66%)] [3:Small (1-33%)] [5:N/A] Necrotic Tissue: [1:Eschar, Adherent Slough Adherent Slough] [5:N/A] Exposed Structures: [1:Fascia: No Fat: No Tendon: No] [3:Fascia: No Fat: No Tendon: No] [5:N/A] Muscle: No Muscle:  No Joint: No Joint: No Bone: No Bone: No Limited to Skin Limited to Skin Breakdown Breakdown Epithelialization: Small (1-33%) Large (67-100%) N/A Periwound Skin Texture: Edema: Yes Edema: Yes No Abnormalities Noted Excoriation: No Excoriation: No Induration: No Induration: No Callus: No Callus: No Crepitus: No Crepitus: No Fluctuance: No Fluctuance: No Friable: No Friable: No Rash: No Rash: No Scarring: No Scarring: No Periwound Skin Moist: Yes Moist: Yes No Abnormalities Noted Moisture: Maceration: No Maceration: No Dry/Scaly: No Dry/Scaly: No Periwound Skin Color: Atrophie Blanche: No Atrophie Blanche: No No Abnormalities Noted Cyanosis: No Cyanosis: No Ecchymosis: No Ecchymosis: No Erythema: No Erythema: No Hemosiderin Staining: No Hemosiderin Staining: No Mottled: No Mottled: No Pallor: No Pallor: No Rubor: No Rubor: No Temperature: No Abnormality No Abnormality N/A Tenderness on Yes Yes No Palpation: Wound Preparation: Ulcer Cleansing: Ulcer Cleansing: N/A Rinsed/Irrigated with Rinsed/Irrigated with Saline Saline Topical Anesthetic Topical Anesthetic Applied: Other: Lidocaine Applied: Other: lidocaine 4% 4% Treatment Notes Electronic Signature(s) Signed: 05/19/2015 12:08:37 PM By: Elpidio Eric BSN, RN Entered By: Elpidio Eric on 05/19/2015 12:08:37 Hagenow, [name] ([id number]) -------------------------------------------------------------------------------- Multi-Disciplinary Care Plan Details Patient Name: [name] Date of Service: 05/19/2015 11:30 AM Medical Record Number: [id number] Patient Account Number: [id number] Date of Birth/Sex: 06/09/1913 (81 y.o. Female) Treating RN: [name], RN, BSN, [employer] Primary Care Physician: PATIENT, NO Other Clinician: Referring Physician: Marisue Ivan Treating Physician/Extender: Rudene Re in Treatment: 11 Active Inactive Abuse / Safety / Falls / Self Care Management Nursing  Diagnoses: Impaired home maintenance Impaired physical mobility Potential for falls Self care deficit: actual or potential Goals: Patient will remain injury free Date Initiated: 03/03/2015 Goal Status: Active Patient/caregiver will verbalize understanding of skin care regimen Date Initiated: 03/03/2015 Goal Status: Active Patient/caregiver will verbalize/demonstrate measure taken to improve self care Date Initiated: 03/03/2015 Goal Status: Active Patient/caregiver will verbalize/demonstrate measures taken to improve the patient's personal safety Date Initiated: 03/03/2015 Goal Status: Active Patient/caregiver will verbalize/demonstrate measures taken to prevent injury and/or falls Date Initiated: 03/03/2015 Goal Status: Active Patient/caregiver will verbalize/demonstrate understanding of what to do in case of emergency Date Initiated: 03/03/2015 Goal Status: Active Interventions: Assess: immobility, friction, shearing, incontinence upon admission and as needed Assess impairment of mobility on admission and as needed per policy Provide education on basic hygiene Provide education on personal and home safety Provide education on safe transfers ANAIJAH, AUGSBURGER ([id number]) Treatment Activities: Education provided on Basic Hygiene : 03/10/2015 Notes: Orientation to the Wound Care Program Nursing Diagnoses: Knowledge deficit related to the wound healing center program Goals: Patient/caregiver will verbalize understanding of the Wound Healing Center Program Date Initiated: 03/03/2015 Goal Status: Active Interventions: Provide education on orientation to the wound center Notes: Venous Leg Ulcer Nursing Diagnoses: Knowledge deficit related to disease process and management Potential for venous Insuffiency (use before  diagnosis confirmed) Goals: Patient will maintain optimal edema control Date Initiated: 03/03/2015 Goal Status: Active Patient/caregiver will verbalize understanding of  disease process and disease management Date Initiated: 03/03/2015 Goal Status: Active Verify adequate tissue perfusion prior to therapeutic compression application Date Initiated: 03/03/2015 Goal Status: Active Interventions: Assess peripheral edema status every visit. Compression as ordered Provide education on venous insufficiency Treatment Activities: Test ordered outside of clinic : 05/19/2015 Notes: HAYVEN, CROY ([id number]) Wound/Skin Impairment Nursing Diagnoses: Impaired tissue integrity Knowledge deficit related to ulceration/compromised skin integrity Goals: Patient/caregiver will verbalize understanding of skin care regimen Date Initiated: 03/03/2015 Goal Status: Active Ulcer/skin breakdown will have a volume reduction of 30% by week 4 Date Initiated: 03/03/2015 Goal Status: Active Ulcer/skin breakdown will have a volume reduction of 50% by week 8 Date Initiated: 03/03/2015 Goal Status: Active Ulcer/skin breakdown will have a volume reduction of 80% by week 12 Date Initiated: 03/03/2015 Goal Status: Active Ulcer/skin breakdown will heal within 14 weeks Date Initiated: 03/03/2015 Goal Status: Active Interventions: Assess patient/caregiver ability to perform ulcer/skin care regimen upon admission and as needed Assess ulceration(s) every visit Provide education on ulcer and skin care Notes: Electronic Signature(s) Signed: 05/19/2015 12:08:24 PM By: Elpidio Eric BSN, RN Entered By: Elpidio Eric on 05/19/2015 12:08:23 Sobolewski, [name] ([id number]) -------------------------------------------------------------------------------- Pain Assessment Details Patient Name: [name] Date of Service: 05/19/2015 11:30 AM Medical Record Number: [id number] Patient Account Number: [id number] Date of Birth/Sex: 11/28/1912 (81 y.o. Female) Treating RN: Clover Mealy, RN, BSN, [employer] Primary Care Physician: PATIENT, NO Other Clinician: Referring Physician: Marisue Ivan Treating  Physician/Extender: Rudene Re in Treatment: 11 Active Problems Location of Pain Severity and Description of Pain Patient Has Paino No Site Locations Pain Management and Medication Current Pain Management: Electronic Signature(s) Signed: 05/19/2015 11:56:32 AM By: Elpidio Eric BSN, RN Entered By: Elpidio Eric on 05/19/2015 11:56:31 Brandis, [name] ([id number]) -------------------------------------------------------------------------------- Patient/Caregiver Education Details Patient Name: [name] Date of Service: 05/19/2015 11:30 AM Medical Record Number: [id number] Patient Account Number: [id number] Date of Birth/Gender: 1913-03-23 (81 y.o. Female) Treating RN: Clover Mealy, RN, BSN, [employer] Primary Care Physician: PATIENT, NO Other Clinician: Referring Physician: Marisue Ivan Treating Physician/Extender: Rudene Re in Treatment: 11 Education Assessment Education Provided To: Patient and Caregiver Education Topics Provided Basic Hygiene: Methods: Explain/Verbal Responses: State content correctly Safety: Methods: Explain/Verbal Responses: State content correctly Venous: Methods: Explain/Verbal Responses: State content correctly Wound/Skin Impairment: Methods: Explain/Verbal Responses: State content correctly Electronic Signature(s) Signed: 05/19/2015 12:17:22 PM By: Elpidio Eric BSN, RN Entered By: Elpidio Eric on 05/19/2015 12:17:22 Cosper, [name] ([id number]) -------------------------------------------------------------------------------- Wound Assessment Details Patient Name: [name] Date of Service: 05/19/2015 11:30 AM Medical Record Number: [id number] Patient Account Number: [id number] Date of Birth/Sex: 09-13-1912 (81 y.o. Female) Treating RN: [name], RN, BSN, Psychologist, clinical Primary Care Physician: PATIENT, NO Other Clinician: Referring Physician: Marisue Ivan Treating Physician/Extender: Rudene Re in Treatment: 11 Wound  Status Wound Number: 1 Primary Etiology: Skin Tear Wound Location: Left Lower Leg - Anterior Wound Status: Open Wounding Event: Trauma Comorbid History: Hypertension, Osteoarthritis Date Acquired: 01/31/2015 Weeks Of Treatment: 11 Clustered Wound: No Photos Photo Uploaded By: Elpidio Eric on 05/19/2015 15:31:03 Wound Measurements Length: (cm) 6.5 Width: (cm) 2.4 Depth: (cm) 0.1 Area: (cm) 12.252 Volume: (cm) 1.225 % Reduction in Area: 65.8% % Reduction in Volume: 88.6% Epithelialization: Small (1-33%) Tunneling: No Undermining: No Wound Description Full Thickness Without Exposed Classification: Support Structures Wound Margin: Distinct, outline attached Exudate Medium Amount: Exudate Type: Serosanguineous Exudate Color: red, brown Foul Odor  After Cleansing: No Wound Bed Granulation Amount: Medium (34-66%) Exposed Structure Granulation Quality: Pink, Pale Fascia Exposed: No Necrotic Amount: Medium (34-66%) Fat Layer Exposed: No Perlmutter, Denim L. ([id number]) Necrotic Quality: Eschar, Adherent Slough Tendon Exposed: No Muscle Exposed: No Joint Exposed: No Bone Exposed: No Limited to Skin Breakdown Periwound Skin Texture Texture Color No Abnormalities Noted: No No Abnormalities Noted: No Callus: No Atrophie Blanche: No Crepitus: No Cyanosis: No Excoriation: No Ecchymosis: No Fluctuance: No Erythema: No Friable: No Hemosiderin Staining: No Induration: No Mottled: No Localized Edema: Yes Pallor: No Rash: No Rubor: No Scarring: No Temperature / Pain Moisture Temperature: No Abnormality No Abnormalities Noted: No Tenderness on Palpation: Yes Dry / Scaly: No Maceration: No Moist: Yes Wound Preparation Ulcer Cleansing: Rinsed/Irrigated with Saline Topical Anesthetic Applied: Other: Lidocaine 4%, Treatment Notes Wound #1 (Left, Anterior Lower Leg) 1. Cleansed with: Clean wound with Normal Saline 4. Dressing Applied: Hydrafera Blue 5. Secondary  Dressing Applied Gauze and Kerlix/Conform 7. Secured with Tape Tubigrip Electronic Signature(s) Signed: 05/19/2015 12:05:01 PM By: Elpidio Eric BSN, RN Entered By: Elpidio Eric on 05/19/2015 12:05:01 Schmoll, [name] ([id number]) -------------------------------------------------------------------------------- Wound Assessment Details Patient Name: [name] Date of Service: 05/19/2015 11:30 AM Medical Record Number: [id number] Patient Account Number: [id number] Date of Birth/Sex: [birth year] (81 y.o. Female) Treating RN: [name], RN, BSN, Psychologist, clinical Primary Care Physician: PATIENT, NO Other Clinician: Referring Physician: Marisue Ivan Treating Physician/Extender: Rudene Re in Treatment: 11 Wound Status Wound Number: 3 Primary Etiology: Lymphedema Wound Location: Right Lower Leg - Midline, Wound Status: Open Anterior Comorbid History: Hypertension, Osteoarthritis Wounding Event: Blister Date Acquired: 03/14/2015 Weeks Of Treatment: 9 Clustered Wound: No Photos Photo Uploaded By: Elpidio Eric on 05/19/2015 15:31:41 Wound Measurements Length: (cm) 2.5 Width: (cm) 1.3 Depth: (cm) 0.2 Area: (cm) 2.553 Volume: (cm) 0.511 % Reduction in Area: 45.8% % Reduction in Volume: -8.5% Epithelialization: Large (67-100%) Tunneling: No Undermining: No Wound Description Classification: Partial Thickness Wound Margin: Indistinct, nonvisible Exudate Amount: Small Exudate Type: Serosanguineous Exudate Color: red, brown Foul Odor After Cleansing: No Wound Bed Granulation Amount: Medium (34-66%) Exposed Structure Granulation Quality: Pink, Pale Fascia Exposed: No Necrotic Amount: Small (1-33%) Fat Layer Exposed: No Perrier, Autumn L. ([id number]) Necrotic Quality: Adherent Slough Tendon Exposed: No Muscle Exposed: No Joint Exposed: No Bone Exposed: No Limited to Skin Breakdown Periwound Skin Texture Texture Color No Abnormalities Noted: No No Abnormalities Noted:  No Callus: No Atrophie Blanche: No Crepitus: No Cyanosis: No Excoriation: No Ecchymosis: No Fluctuance: No Erythema: No Friable: No Hemosiderin Staining: No Induration: No Mottled: No Localized Edema: Yes Pallor: No Rash: No Rubor: No Scarring: No Temperature / Pain Moisture Temperature: No Abnormality No Abnormalities Noted: No Tenderness on Palpation: Yes Dry / Scaly: No Maceration: No Moist: Yes Wound Preparation Ulcer Cleansing: Rinsed/Irrigated with Saline Topical Anesthetic Applied: Other: lidocaine 4%, Treatment Notes Wound #3 (Right, Midline, Anterior Lower Leg) 1. Cleansed with: Clean wound with Normal Saline 4. Dressing Applied: Hydrafera Blue 5. Secondary Dressing Applied Gauze and Kerlix/Conform 7. Secured with Tape Tubigrip Electronic Signature(s) Signed: 05/19/2015 12:05:13 PM By: Elpidio Eric BSN, RN Entered By: Elpidio Eric on 05/19/2015 12:05:13 Schouten, [name] ([id number]) -------------------------------------------------------------------------------- Wound Assessment Details Patient Name: [name] Date of Service: 05/19/2015 11:30 AM Medical Record Number: [id number] Patient Account Number: [id number] Date of Birth/Sex: 07/28/1913 (81 y.o. Female) Treating RN: Clover Mealy, RN, BSN, [employer] Primary Care Physician: PATIENT, NO Other Clinician: Referring Physician: Marisue Ivan Treating Physician/Extender: Rudene Re in Treatment: 11 Wound Status Wound Number:  5 Primary Etiology: Skin Tear Wound Location: Right, Dorsal Foot Wound Status: Healed - Epithelialized Wounding Event: Blister Date Acquired: 04/29/2015 Weeks Of Treatment: 2 Clustered Wound: No Photos Photo Uploaded By: Elpidio Eric on 05/19/2015 15:31:42 Wound Measurements Length: (cm) 0 Width: (cm) 0 Depth: (cm) 0 Area: (cm) 0 Volume: (cm) 0 % Reduction in Area: 100% % Reduction in Volume: 100% Wound Description Classification: Partial Thickness Periwound  Skin Texture Texture Color No Abnormalities Noted: No No Abnormalities Noted: No Moisture No Abnormalities Noted: No Electronic Signature(s) Signed: 05/19/2015 4:32:55 PM By: Elpidio Eric BSN, RN Tramell, [name] ([id number]) Entered By: Elpidio Eric on 05/19/2015 12:03:51 Lenderman, [name] ([id number]) -------------------------------------------------------------------------------- Vitals Details Patient Name: [name] Date of Service: 05/19/2015 11:30 AM Medical Record Number: [id number] Patient Account Number: [id number] Date of Birth/Sex: May 22, 1913 (81 y.o. Female) Treating RN: [name], RN, BSN, Rita Primary Care Physician: PATIENT, NO Other Clinician: Referring Physician: Marisue Ivan Treating Physician/Extender: Rudene Re in Treatment: 11 Vital Signs Time Taken: 11:56 Temperature (F): 97.8 Height (in): 64 Pulse (bpm): 64 Weight (lbs): 147 Respiratory Rate (breaths/min): 18 Body Mass Index (BMI): 25.2 Blood Pressure (mmHg): 128/57 Reference Range: 80 - 120 mg / dl Electronic Signature(s) Signed: 05/19/2015 11:57:31 AM By: Elpidio Eric BSN, RN Entered By: Elpidio Eric on 05/19/2015 11:57:31

## 2015-05-20 NOTE — Progress Notes (Signed)
Leah, Yu ([id number]) Visit Report for 05/19/2015 Chief Complaint Document Details Patient Name: [name], [name] 05/19/2015 11:30 Date of Service: AM Medical Record [id number] Number: Patient Account Number: [id number] [birth year] (81 y.o. [name], RN, BSN, Date of Birth/Sex: Treating RN: Female) Psychologist, clinical Primary Care Physician: PATIENT, NO Other Clinician: Referring Physician: Marisue Ivan Treating Britto, Errol Physician/Extender: Weeks in Treatment: 11 Information Obtained from: Patient Chief Complaint Patient seen for complaints of Non-Healing Wound. pleasant 81 year old patient who had a fall and injured both lower extremities on 01/31/2015 Electronic Signature(s) Signed: 05/19/2015 12:16:59 PM By: Evlyn Kanner MD, FACS Entered By: Evlyn Kanner on 05/19/2015 12:16:59 Chamblin, [name] ([id number]) -------------------------------------------------------------------------------- Debridement Details Patient Name: Leah, Yu 05/19/2015 11:30 Date of Service: AM Medical Record [id number] Number: Patient Account Number: [id number] 06-03-1913 (81 y.o. [name], RN, BSN, Date of Birth/Sex: Treating RN: Female) Psychologist, clinical Primary Care Physician: PATIENT, NO Other Clinician: Referring Physician: Marisue Ivan Treating Britto, Errol Physician/Extender: Weeks in Treatment: 11 Debridement Performed for Wound #1 Left,Anterior Lower Leg Assessment: Performed By: Physician Tristan Schroeder., MD Debridement: Open Wound/Selective Debridement Selective Description: Pre-procedure Yes Verification/Time Out Taken: Start Time: 12:12 Pain Control: Lidocaine 4% Topical Solution Level: Non-Viable Tissue Total Area Debrided (L x 3 (cm) x 4 (cm) = 12 (cm) W): Tissue and other Non-Viable, Eschar, Exudate, Fibrin/Slough material debrided: Instrument: Forceps Bleeding: Minimum Hemostasis Achieved: Pressure End Time: 12:13 Procedural Pain: 0 Post Procedural Pain:  0 Response to Treatment: Procedure was tolerated well Post Debridement Measurements of Total Wound Length: (cm) 6.5 Width: (cm) 2.4 Depth: (cm) 0.1 Volume: (cm) 1.225 Post Procedure Diagnosis Same as Pre-procedure Electronic Signature(s) Signed: 05/19/2015 12:16:38 PM By: Evlyn Kanner MD, FACS Signed: 05/19/2015 4:32:55 PM By: Elpidio Eric BSN, RN Previous Signature: 05/19/2015 12:14:01 PM Version By: Elpidio Eric BSN, RN Sakurai, [name] ([id number]) Entered By: Evlyn Kanner on 05/19/2015 12:16:38 Friddle, [name] ([id number]) -------------------------------------------------------------------------------- Debridement Details Patient Name: [name], [name] 05/19/2015 11:30 Date of Service: AM Medical Record [id number] Number: Patient Account Number: [id number] 04/01/1913 (81 y.o. [name], RN, BSN, Date of Birth/Sex: Treating RN: Female) Psychologist, clinical Primary Care Physician: PATIENT, NO Other Clinician: Referring Physician: Marisue Ivan Treating Britto, Errol Physician/Extender: Weeks in Treatment: 11 Debridement Performed for Wound #3 Right,Midline,Anterior Lower Leg Assessment: Performed By: Physician Tristan Schroeder., MD Debridement: Open Wound/Selective Debridement Selective Description: Pre-procedure Yes Verification/Time Out Taken: Start Time: 12:13 Pain Control: Lidocaine 4% Topical Solution Level: Non-Viable Tissue Total Area Debrided (L x 2.5 (cm) x 1.3 (cm) = 3.25 (cm) W): Tissue and other Non-Viable, Exudate, Fibrin/Slough material debrided: Instrument: Forceps Bleeding: Minimum Hemostasis Achieved: Pressure End Time: 12:15 Procedural Pain: 0 Post Procedural Pain: 0 Response to Treatment: Procedure was tolerated well Post Debridement Measurements of Total Wound Length: (cm) 2.5 Width: (cm) 1.3 Depth: (cm) 0.2 Volume: (cm) 0.511 Post Procedure Diagnosis Same as Pre-procedure Electronic Signature(s) Signed: 05/19/2015 12:16:53 PM By: Evlyn Kanner  MD, FACS Signed: 05/19/2015 4:32:55 PM By: Elpidio Eric BSN, RN Previous Signature: 05/19/2015 12:15:10 PM Version By: Elpidio Eric BSN, RN Habermann, [name] ([id number]) Entered By: Evlyn Kanner on 05/19/2015 12:16:53 Nathaniel, [name] ([id number]) -------------------------------------------------------------------------------- HPI Details Patient Name: Leah, Yu 05/19/2015 11:30 Date of Service: AM Medical Record [id number] Number: Patient Account Number: [id number] [birth year] (81 y.o. [name], RN, BSN, Date of Birth/Sex: Treating RN: Female) Psychologist, clinical Primary Care Physician: PATIENT, NO Other Clinician: Referring Physician: Marisue Ivan Treating Britto, Errol Physician/Extender: Weeks in Treatment: 11 History of Present Illness Location: both lower extremities Quality: Patient reports experiencing a  dull pain to affected area(s). Severity: Patient states wound (s) are getting better. Duration: Patient has had the wound for < 4 weeks prior to presenting for treatment Timing: Pain in wound is Intermittent (comes and goes Context: The wound occurred when the patient had a fall and lacerated both lower extremities. Modifying Factors: Other treatment(s) tried include:she had sutures applied there for a while and when they were removed the wound opened up. Associated Signs and Symptoms: Patient reports having difficulty standing for long periods. HPI Description: 81 year old female who fell and had an injury to her right forehead, right elbow and both shins on 01/31/2015. She went to the ER at [facility] and had been treated appropriately with sutures being placed to her right and left lower extremity. Also had a CT scan which showed no intracranial hemorrhage and only a large scalp hematoma on the right frontal region.she was put on Keflex for 5 days. she had also received Bactrim for 14 days.Since then she has been seen by her PCP and he had put her on Augmentin for 14 days  and wound culture was obtained. culture reports were reviewed -- she grew an MRSA sensitive to tetracycline, Bactrim and vancomycin. Her past medical history significant for osteoporosis, hypertension, spinal stenosis, right hip replacement, appendectomy and abdomen hysterectomy for ovarian cancer. She has been seen in the past by the vascular surgery group at [facility] and she has an appointment to see them again. She is known to use lymphedema pumps while she has been in her assisted living home but has not been using them for the last month. She does also have compression stockings which she uses intermittently. 03/17/2015 -- She was seen by Dr. Gilda Crease on 03/03/2015 -- he reviewed the patientos case and recommended no surgical intervention. He has recommended compression stockings of the 20-30 mmHg variety and also recommended lymphedema pumps. He will see her back in 3 months. The patient has developed some blisters on both lower extremities and this may be due to the fact that she's had a Kerlix gauze wrap on both lower extremities and is also using the lymphedema pumps. 05/09/2015 -- she has a new wound on the dorsum of the right foot just proximal to the toes but this was probably a blister which opened out and is very superficial. Electronic Signature(s) Signed: 05/19/2015 12:17:04 PM By: Evlyn Kanner MD, FACS Entered By: Evlyn Kanner on 05/19/2015 12:17:04 Jacobsen, [name] ([id number]) Claycomb, [name] ([id number]) -------------------------------------------------------------------------------- Physical Exam Details Patient Name: JUANETTA, NEGASH 05/19/2015 11:30 Date of Service: AM Medical Record [id number] Number: Patient Account Number: [id number] [birth year] (81 y.o. [name], RN, BSN, Date of Birth/Sex: Treating RN: Female) Psychologist, clinical Primary Care Physician: PATIENT, NO Other Clinician: Referring Physician: Marisue Ivan Treating Britto,  Errol Physician/Extender: Weeks in Treatment: 11 Constitutional . Pulse regular. Respirations normal and unlabored. Afebrile. . Eyes Nonicteric. Reactive to light. Ears, Nose, Mouth, and Throat Lips, teeth, and gums WNL.Marland Kitchen Moist mucosa without lesions . Neck supple and nontender. No palpable supraclavicular or cervical adenopathy. Normal sized without goiter. Respiratory WNL. No retractions.. Cardiovascular Pedal Pulses WNL. No clubbing, cyanosis or edema. Chest Breasts symmetical and no nipple discharge.. Breast tissue WNL, no masses, lumps, or tenderness.. Lymphatic No adneopathy. No adenopathy. No adenopathy. Musculoskeletal Adexa without tenderness or enlargement.. Digits and nails w/o clubbing, cyanosis, infection, petechiae, ischemia, or inflammatory conditions.. Integumentary (Hair, Skin) No suspicious lesions. No crepitus or fluctuance. No peri-wound warmth or erythema. No masses.Marland Kitchen Psychiatric Judgement and insight Intact.Marland Kitchen No  evidence of depression, anxiety, or agitation.. Notes the wound on the dorsum of her right foot has healed completely and the one on the upper part of her right leg is almost completely closed. The other large ulcerations on her right and left leg need some debridement and after this is done it's looking clean. Electronic Signature(s) Signed: 05/19/2015 12:17:42 PM By: Evlyn Kanner MD, FACS Entered By: Evlyn Kanner on 05/19/2015 12:17:41 Galdamez, [name] ([id number]) -------------------------------------------------------------------------------- Physician Orders Details Patient Name: MAIZEY, MENENDEZ 05/19/2015 11:30 Date of Service: AM Medical Record [id number] Number: Patient Account Number: [id number] June 06, 1913 (81 y.o. [name], RN, BSN, Date of Birth/Sex: Treating RN: Female) Psychologist, clinical Primary Care Physician: PATIENT, NO Other Clinician: Referring Physician: Marisue Ivan Treating Britto, Errol Physician/Extender: Weeks in Treatment:  11 Verbal / Phone Orders: Yes Clinician: [name], RN, BSN, Rita Read Back and Verified: Yes Diagnosis Coding Wound Cleansing Wound #1 Left,Anterior Lower Leg o Clean wound with Normal Saline. Wound #3 Right,Midline,Anterior Lower Leg o Clean wound with Normal Saline. Skin Barriers/Peri-Wound Care Wound #1 Left,Anterior Lower Leg o Moisturizing lotion Wound #3 Right,Midline,Anterior Lower Leg o Moisturizing lotion Primary Wound Dressing Wound #1 Left,Anterior Lower Leg o Hydrafera Blue Wound #3 Right,Midline,Anterior Lower Leg o Hydrafera Blue Secondary Dressing Wound #1 Left,Anterior Lower Leg o Gauze and Kerlix/Conform Wound #3 Right,Midline,Anterior Lower Leg o Gauze and Kerlix/Conform Dressing Change Frequency Wound #1 Left,Anterior Lower Leg o Change dressing every other day. Wound #3 Right,Midline,Anterior Lower Leg o Change dressing every other day. DAYSHA, ASHMORE ([id number]) Follow-up Appointments Wound #1 Left,Anterior Lower Leg o Return Appointment in 1 week. Wound #3 Right,Midline,Anterior Lower Leg o Return Appointment in 1 week. Edema Control Wound #1 Left,Anterior Lower Leg o Tubigrip Wound #3 Right,Midline,Anterior Lower Leg o Tubigrip Home Health Wound #1 Left,Anterior Lower Leg o Continue Home Health Visits - [facility] o Home Health Nurse may visit PRN to address patientos wound care needs. o FACE TO FACE ENCOUNTER: MEDICARE and MEDICAID PATIENTS: I certify that this patient is under my care and that I had a face-to-face encounter that meets the physician face-to-face encounter requirements with this patient on this date. The encounter with the patient was in whole or in part for the following MEDICAL CONDITION: (primary reason for Home Healthcare) MEDICAL NECESSITY: I certify, that based on my findings, NURSING services are a medically necessary home health service. HOME BOUND STATUS: I certify that my clinical  findings support that this patient is homebound (i.e., Due to illness or injury, pt requires aid of supportive devices such as crutches, cane, wheelchairs, walkers, the use of special transportation or the assistance of another person to leave their place of residence. There is a normal inability to leave the home and doing so requires considerable and taxing effort. Other absences are for medical reasons / religious services and are infrequent or of short duration when for other reasons). o If current dressing causes regression in wound condition, may D/C ordered dressing product/s and apply Normal Saline Moist Dressing daily until next Wound Healing Center / Other MD appointment. Notify Wound Healing Center of regression in wound condition at [phone]. o Please direct any NON-WOUND related issues/requests for orders to patient's Primary Care Physician Wound #3 Right,Midline,Anterior Lower Leg o Continue Home Health Visits - [facility] o Home Health Nurse may visit PRN to address patientos wound care needs. o FACE TO FACE ENCOUNTER: MEDICARE and MEDICAID PATIENTS: I certify that this patient is under my care and that I had a face-to-face encounter that meets  the physician face-to-face encounter requirements with this patient on this date. The encounter with the patient was in whole or in part for the following MEDICAL CONDITION: (primary reason for Home Healthcare) MEDICAL NECESSITY: I certify, that based on my findings, NURSING services are a medically necessary home health service. HOME BOUND STATUS: I certify that my clinical findings support that this patient is homebound (i.e., Due to illness or injury, pt requires aid of supportive devices such as crutches, cane, wheelchairs, walkers, the use of special transportation or the assistance of another person to leave their place of residence. There is a Hilleary, Davine L. ([id number]) normal inability to leave the home and doing  so requires considerable and taxing effort. Other absences are for medical reasons / religious services and are infrequent or of short duration when for other reasons). o If current dressing causes regression in wound condition, may D/C ordered dressing product/s and apply Normal Saline Moist Dressing daily until next Wound Healing Center / Other MD appointment. Notify Wound Healing Center of regression in wound condition at [phone]. o Please direct any NON-WOUND related issues/requests for orders to patient's Primary Care Physician Electronic Signature(s) Signed: 05/19/2015 12:16:32 PM By: Elpidio Eric BSN, RN Signed: 05/19/2015 3:44:09 PM By: Evlyn Kanner MD, FACS Entered By: Elpidio Eric on 05/19/2015 12:16:31 Schoneman, [name] ([id number]) -------------------------------------------------------------------------------- Problem List Details Patient Name: DAROLYN, DOUBLE 05/19/2015 11:30 Date of Service: AM Medical Record [id number] Number: Patient Account Number: [id number] July 11, 1913 (81 y.o. [name], RN, BSN, Date of Birth/Sex: Treating RN: Female) Psychologist, clinical Primary Care Physician: PATIENT, NO Other Clinician: Referring Physician: Clelia Croft, Errol Physician/Extender: Weeks in Treatment: 11 Active Problems ICD-10 Encounter Code Description Active Date Diagnosis L97.212 Non-pressure chronic ulcer of right calf with fat layer 03/03/2015 Yes exposed L97.222 Non-pressure chronic ulcer of left calf with fat layer 03/03/2015 Yes exposed I89.0 Lymphedema, not elsewhere classified 03/03/2015 Yes L97.511 Non-pressure chronic ulcer of other part of right foot 04/29/2015 Yes limited to breakdown of skin Inactive Problems Resolved Problems Electronic Signature(s) Signed: 05/19/2015 12:16:08 PM By: Evlyn Kanner MD, FACS Entered By: Evlyn Kanner on 05/19/2015 12:16:08 Mccartt, [name]  ([id number]) -------------------------------------------------------------------------------- Progress Note Details Patient Name: KANASIA, GAYMAN 05/19/2015 11:30 Date of Service: AM Medical Record [id number] Number: Patient Account Number: [id number] 03-15-1913 (81 y.o. [name], RN, BSN, Date of Birth/Sex: Treating RN: Female) Psychologist, clinical Primary Care Physician: PATIENT, NO Other Clinician: Referring Physician: Marisue Ivan Treating Britto, Errol Physician/Extender: Weeks in Treatment: 11 Subjective Chief Complaint Information obtained from Patient Patient seen for complaints of Non-Healing Wound. pleasant 81 year old patient who had a fall and injured both lower extremities on 01/31/2015 History of Present Illness (HPI) The following HPI elements were documented for the patient's wound: Location: both lower extremities Quality: Patient reports experiencing a dull pain to affected area(s). Severity: Patient states wound (s) are getting better. Duration: Patient has had the wound for < 4 weeks prior to presenting for treatment Timing: Pain in wound is Intermittent (comes and goes Context: The wound occurred when the patient had a fall and lacerated both lower extremities. Modifying Factors: Other treatment(s) tried include:she had sutures applied there for a while and when they were removed the wound opened up. Associated Signs and Symptoms: Patient reports having difficulty standing for long periods. 81 year old female who fell and had an injury to her right forehead, right elbow and both shins on 01/31/2015. She went to the ER at [facility] and had been treated appropriately with sutures being placed to her  right and left lower extremity. Also had a CT scan which showed no intracranial hemorrhage and only a large scalp hematoma on the right frontal region.she was put on Keflex for 5 days. she had also received Bactrim for 14 days.Since then she has been seen by her PCP and  he had put her on Augmentin for 14 days and wound culture was obtained. culture reports were reviewed -- she grew an MRSA sensitive to tetracycline, Bactrim and vancomycin. Her past medical history significant for osteoporosis, hypertension, spinal stenosis, right hip replacement, appendectomy and abdomen hysterectomy for ovarian cancer. She has been seen in the past by the vascular surgery group at [facility] and she has an appointment to see them again. She is known to use lymphedema pumps while she has been in her assisted living home but has not been using them for the last month. She does also have compression stockings which she uses intermittently. 03/17/2015 -- She was seen by Dr. Gilda Crease on 03/03/2015 -- he reviewed the patient s case and recommended no surgical intervention. He has recommended compression stockings of the 20-30 mmHg variety and also recommended lymphedema pumps. He will see her back in 3 months. The patient has developed some blisters on both lower extremities and this may be due to the fact that Govan, Jelesa L. ([id number]) she's had a Kerlix gauze wrap on both lower extremities and is also using the lymphedema pumps. 05/09/2015 -- she has a new wound on the dorsum of the right foot just proximal to the toes but this was probably a blister which opened out and is very superficial. Objective Constitutional Pulse regular. Respirations normal and unlabored. Afebrile. Vitals Time Taken: 11:56 AM, Height: 64 in, Weight: 147 lbs, BMI: 25.2, Temperature: 97.8 F, Pulse: 64 bpm, Respiratory Rate: 18 breaths/min, Blood Pressure: 128/57 mmHg. Eyes Nonicteric. Reactive to light. Ears, Nose, Mouth, and Throat Lips, teeth, and gums WNL.Marland Kitchen Moist mucosa without lesions . Neck supple and nontender. No palpable supraclavicular or cervical adenopathy. Normal sized without goiter. Respiratory WNL. No retractions.. Cardiovascular Pedal Pulses WNL. No clubbing, cyanosis  or edema. Chest Breasts symmetical and no nipple discharge.. Breast tissue WNL, no masses, lumps, or tenderness.. Lymphatic No adneopathy. No adenopathy. No adenopathy. Musculoskeletal Adexa without tenderness or enlargement.. Digits and nails w/o clubbing, cyanosis, infection, petechiae, ischemia, or inflammatory conditions.Marland Kitchen Psychiatric Judgement and insight Intact.. No evidence of depression, anxiety, or agitation.. General Notes: the wound on the dorsum of her right foot has healed completely and the one on the upper part of her right leg is almost completely closed. The other large ulcerations on her right and left leg need some debridement and after this is done it's looking clean. Blowe, Quinn L. ([id number]) Integumentary (Hair, Skin) No suspicious lesions. No crepitus or fluctuance. No peri-wound warmth or erythema. No masses.. Wound #1 status is Open. Original cause of wound was Trauma. The wound is located on the Left,Anterior Lower Leg. The wound measures 6.5cm length x 2.4cm width x 0.1cm depth; 12.252cm^2 area and 1.225cm^3 volume. The wound is limited to skin breakdown. There is no tunneling or undermining noted. There is a medium amount of serosanguineous drainage noted. The wound margin is distinct with the outline attached to the wound base. There is medium (34-66%) pink, pale granulation within the wound bed. There is a medium (34-66%) amount of necrotic tissue within the wound bed including Eschar and Adherent Slough. The periwound skin appearance exhibited: Localized Edema, Moist. The periwound skin appearance did not exhibit:  Callus, Crepitus, Excoriation, Fluctuance, Friable, Induration, Rash, Scarring, Dry/Scaly, Maceration, Atrophie Blanche, Cyanosis, Ecchymosis, Hemosiderin Staining, Mottled, Pallor, Rubor, Erythema. Periwound temperature was noted as No Abnormality. The periwound has tenderness on palpation. Wound #3 status is Open. Original cause of wound was  Blister. The wound is located on the Right,Midline,Anterior Lower Leg. The wound measures 2.5cm length x 1.3cm width x 0.2cm depth; 2.553cm^2 area and 0.511cm^3 volume. The wound is limited to skin breakdown. There is no tunneling or undermining noted. There is a small amount of serosanguineous drainage noted. The wound margin is indistinct and nonvisible. There is medium (34-66%) pink, pale granulation within the wound bed. There is a small (1-33%) amount of necrotic tissue within the wound bed including Adherent Slough. The periwound skin appearance exhibited: Localized Edema, Moist. The periwound skin appearance did not exhibit: Callus, Crepitus, Excoriation, Fluctuance, Friable, Induration, Rash, Scarring, Dry/Scaly, Maceration, Atrophie Blanche, Cyanosis, Ecchymosis, Hemosiderin Staining, Mottled, Pallor, Rubor, Erythema. Periwound temperature was noted as No Abnormality. The periwound has tenderness on palpation. Wound #5 status is Healed - Epithelialized. Original cause of wound was Blister. The wound is located on the Right,Dorsal Foot. The wound measures 0cm length x 0cm width x 0cm depth; 0cm^2 area and 0cm^3 volume. Assessment Active Problems ICD-10 L97.212 - Non-pressure chronic ulcer of right calf with fat layer exposed L97.222 - Non-pressure chronic ulcer of left calf with fat layer exposed I89.0 - Lymphedema, not elsewhere classified L97.511 - Non-pressure chronic ulcer of other part of right foot limited to breakdown of skin Minerva, Simranjit L. ([id number]) We are going to use use Hydrofera Blue on the large ulcerations and a small piece of Prisma on the smaller wounds. She will continue to use elevation of her limbs as much as possible and see me back next week. Procedures Wound #1 Wound #1 is a Skin Tear located on the Left,Anterior Lower Leg . There was a Non-Viable Tissue Open Wound/Selective [phone]) debridement with total area of 12 sq cm performed by Britto, Ignacia Felling., MD. with the following instrument(s): Forceps to remove Non-Viable tissue/material including Exudate, Fibrin/Slough, and Eschar after achieving pain control using Lidocaine 4% Topical Solution. A time out was conducted prior to the start of the procedure. A Minimum amount of bleeding was controlled with Pressure. The procedure was tolerated well with a pain level of 0 throughout and a pain level of 0 following the procedure. Post Debridement Measurements: 6.5cm length x 2.4cm width x 0.1cm depth; 1.225cm^3 volume. Post procedure Diagnosis Wound #1: Same as Pre-Procedure Wound #3 Wound #3 is a Lymphedema located on the Right,Midline,Anterior Lower Leg . There was a Non-Viable Tissue Open Wound/Selective [phone]) debridement with total area of 3.25 sq cm performed by Britto, Ignacia Felling., MD. with the following instrument(s): Forceps to remove Non-Viable tissue/material including Exudate and Fibrin/Slough after achieving pain control using Lidocaine 4% Topical Solution. A time out was conducted prior to the start of the procedure. A Minimum amount of bleeding was controlled with Pressure. The procedure was tolerated well with a pain level of 0 throughout and a pain level of 0 following the procedure. Post Debridement Measurements: 2.5cm length x 1.3cm width x 0.2cm depth; 0.511cm^3 volume. Post procedure Diagnosis Wound #3: Same as Pre-Procedure Plan Wound Cleansing: Wound #1 Left,Anterior Lower Leg: Clean wound with Normal Saline. Wound #3 Right,Midline,Anterior Lower Leg: Clean wound with Normal Saline. Skin Barriers/Peri-Wound Care: Wound #1 Left,Anterior Lower Leg: Moisturizing lotion Wound #3 Right,Midline,Anterior Lower Leg: Moisturizing lotion Primary Wound Dressing: Wound #1 Left,Anterior Lower  Leg: George, Charlette L. ([id number]) Hydrafera Blue Wound #3 Right,Midline,Anterior Lower Leg: Hydrafera Blue Secondary Dressing: Wound #1 Left,Anterior Lower Leg: Gauze and  Kerlix/Conform Wound #3 Right,Midline,Anterior Lower Leg: Gauze and Kerlix/Conform Dressing Change Frequency: Wound #1 Left,Anterior Lower Leg: Change dressing every other day. Wound #3 Right,Midline,Anterior Lower Leg: Change dressing every other day. Follow-up Appointments: Wound #1 Left,Anterior Lower Leg: Return Appointment in 1 week. Wound #3 Right,Midline,Anterior Lower Leg: Return Appointment in 1 week. Edema Control: Wound #1 Left,Anterior Lower Leg: Tubigrip Wound #3 Right,Midline,Anterior Lower Leg: Tubigrip Home Health: Wound #1 Left,Anterior Lower Leg: Continue Home Health Visits - [facility] Nurse may visit PRN to address patient s wound care needs. FACE TO FACE ENCOUNTER: MEDICARE and MEDICAID PATIENTS: I certify that this patient is under my care and that I had a face-to-face encounter that meets the physician face-to-face encounter requirements with this patient on this date. The encounter with the patient was in whole or in part for the following MEDICAL CONDITION: (primary reason for Home Healthcare) MEDICAL NECESSITY: I certify, that based on my findings, NURSING services are a medically necessary home health service. HOME BOUND STATUS: I certify that my clinical findings support that this patient is homebound (i.e., Due to illness or injury, pt requires aid of supportive devices such as crutches, cane, wheelchairs, walkers, the use of special transportation or the assistance of another person to leave their place of residence. There is a normal inability to leave the home and doing so requires considerable and taxing effort. Other absences are for medical reasons / religious services and are infrequent or of short duration when for other reasons). If current dressing causes regression in wound condition, may D/C ordered dressing product/s and apply Normal Saline Moist Dressing daily until next Wound Healing Center / Other MD appointment. Notify  Wound Healing Center of regression in wound condition at [phone]. Please direct any NON-WOUND related issues/requests for orders to patient's Primary Care Physician Wound #3 Right,Midline,Anterior Lower Leg: Continue Home Health Visits - [facility] Nurse may visit PRN to address patient s wound care needs. FACE TO FACE ENCOUNTER: MEDICARE and MEDICAID PATIENTS: I certify that this patient is under my care and that I had a face-to-face encounter that meets the physician face-to-face encounter requirements with this patient on this date. The encounter with the patient was in whole or in part for the following MEDICAL CONDITION: (primary reason for Home Healthcare) MEDICAL NECESSITY: I certify, that based on my findings, NURSING services are a medically necessary home health service. HOME BOUND STATUS: I certify that my clinical findings support that this patient is homebound (i.e., Due to illness or injury, pt requires aid of supportive devices such as crutches, cane, wheelchairs, walkers, the use Clabaugh, Gianelle L. ([id number]) of special transportation or the assistance of another person to leave their place of residence. There is a normal inability to leave the home and doing so requires considerable and taxing effort. Other absences are for medical reasons / religious services and are infrequent or of short duration when for other reasons). If current dressing causes regression in wound condition, may D/C ordered dressing product/s and apply Normal Saline Moist Dressing daily until next Wound Healing Center / Other MD appointment. Notify Wound Healing Center of regression in wound condition at [phone]. Please direct any NON-WOUND related issues/requests for orders to patient's Primary Care Physician We are going to use use Hydrofera Blue on the large ulcerations and a small piece of Prisma on the smaller  wounds. She will continue to use elevation of her limbs as much as  possible and see me back next week. Electronic Signature(s) Signed: 05/19/2015 12:18:48 PM By: Evlyn Kanner MD, FACS Entered By: Evlyn Kanner on 05/19/2015 12:18:47 Gama, [name] ([id number]) -------------------------------------------------------------------------------- SuperBill Details Patient Name: [name] Date of Service: 05/19/2015 Medical Record Patient Account Number: [id number] Number: [name], RN, BSN, Treating RN: 1913/05/08 (81 y.o. 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 I89.0 Lymphedema, not elsewhere classified L97.511 Non-pressure chronic ulcer of other part of right foot limited to breakdown of skin Facility Procedures CPT4 Code: [id number] Description: 97597 - DEBRIDE WOUND 1ST 20 SQ CM OR < ICD-10 Description Diagnosis L97.212 Non-pressure chronic ulcer of right calf with fat l L97.222 Non-pressure chronic ulcer of left calf with fat la I89.0 Lymphedema, not elsewhere classified Modifier: ayer exposed yer exposed Quantity: 1 Physician Procedures CPT4 Code: [id number] Description: 97597 - WC PHYS DEBR WO ANESTH 20 SQ CM ICD-10 Description Diagnosis L97.212 Non-pressure chronic ulcer of right calf with fat l L97.222 Non-pressure chronic ulcer of left calf with fat la I89.0 Lymphedema, not elsewhere classified Modifier: ayer exposed yer exposed Quantity: 1 Electronic Signature(s) Signed: 05/19/2015 12:19:07 PM By: Evlyn Kanner MD, FACS Entered By: Evlyn Kanner on 05/19/2015 12:19:07

## 2015-05-26 ENCOUNTER — Ambulatory Visit: Payer: Medicare Other | Admitting: Surgery

## 2015-05-27 ENCOUNTER — Encounter: Payer: Medicare Other | Admitting: Surgery

## 2015-05-27 DIAGNOSIS — L97212 Non-pressure chronic ulcer of right calf with fat layer exposed: Secondary | ICD-10-CM | POA: Diagnosis not present

## 2015-05-27 NOTE — Progress Notes (Signed)
MAKINSEY, PEPITONE ([id number]) Visit Report for 05/27/2015 Arrival Information Details Patient Name: [name], [name] Date of Service: 05/27/2015 9:30 AM Medical Record Number: [id number] Patient Account Number: [id number] Date of Birth/Sex: 24-Apr-1913 (81 y.o. Female) Treating RN: [name], RN, BSN, [employer] Primary Care Physician: PATIENT, NO Other Clinician: Referring Physician: Marisue Ivan Treating Physician/Extender: Rudene Re in Treatment: 12 Visit Information History Since Last Visit Any new allergies or adverse reactions: No Patient Arrived: Wheel Chair Had a fall or experienced change in No Arrival Time: 09:40 activities of daily living that may affect Accompanied By: caregiver risk of falls: Transfer Assistance: EasyPivot Signs or symptoms of abuse/neglect since last No Patient Lift visito Patient Identification Verified: Yes Hospitalized since last visit: No Secondary Verification Process Yes Has Dressing in Place as Prescribed: Yes Completed: Has Compression in Place as Prescribed: Yes Patient Requires Transmission- No Pain Present Now: No Based Precautions: Patient Has Alerts: Yes Electronic Signature(s) Signed: 05/27/2015 9:42:41 AM By: Elpidio Eric BSN, RN Entered By: Elpidio Eric on 05/27/2015 09:42:41 [name], [name] ([id number]) -------------------------------------------------------------------------------- Clinic Level of Care Assessment Details Patient Name: [name] Date of Service: 05/27/2015 9:30 AM Medical Record Number: [id number] Patient Account Number: [id number] Date of Birth/Sex: 1912/10/27 (81 y.o. Female) Treating RN: [name], RN, BSN, Psychologist, clinical Primary Care Physician: PATIENT, NO Other Clinician: Referring Physician: Marisue Ivan Treating Physician/Extender: Rudene Re in Treatment: 12 Clinic Level of Care Assessment Items TOOL 4 Quantity Score  - Use when only an EandM is performed on FOLLOW-UP visit  0 ASSESSMENTS - Nursing Assessment / Reassessment X - Reassessment of Co-morbidities (includes updates in patient status) 1 10 X - Reassessment of Adherence to Treatment Plan 1 5 ASSESSMENTS - Wound and Skin Assessment / Reassessment  - Simple Wound Assessment / Reassessment - one wound 0 X - Complex Wound Assessment / Reassessment - multiple wounds 2 5  - Dermatologic / Skin Assessment (not related to wound area) 0 ASSESSMENTS - Focused Assessment  - Circumferential Edema Measurements - multi extremities 0  - Nutritional Assessment / Counseling / Intervention 0  - Lower Extremity Assessment (monofilament, tuning fork, pulses) 0  - Peripheral Arterial Disease Assessment (using hand held doppler) 0 ASSESSMENTS - Ostomy and/or Continence Assessment and Care  - Incontinence Assessment and Management 0  - Ostomy Care Assessment and Management (repouching, etc.) 0 PROCESS - Coordination of Care X - Simple Patient / Family Education for ongoing care 1 15  - Complex (extensive) Patient / Family Education for ongoing care 0  - Staff obtains Chiropractor, Records, Test Results / Process Orders 0  - Staff telephones HHA, Nursing Homes / Clarify orders / etc 0  - Routine Transfer to another Facility (non-emergent condition) 0 Bodine, Mirakle L. ([id number])  - Routine Hospital Admission (non-emergent condition) 0  - New Admissions / Manufacturing engineer / Ordering NPWT, Apligraf, etc. 0  - Emergency Hospital Admission (emergent condition) 0 X - Simple Discharge Coordination 1 10  - Complex (extensive) Discharge Coordination 0 PROCESS - Special Needs  - Pediatric / Minor Patient Management 0  - Isolation Patient Management 0  - Hearing / Language / Visual special needs 0  - Assessment of Community assistance (transportation, D/C planning, etc.) 0  - Additional assistance / Altered mentation 0  - Support Surface(s) Assessment (bed, cushion, seat, etc.)  0 INTERVENTIONS - Wound Cleansing / Measurement  - Simple Wound Cleansing - one wound 0 X - Complex Wound Cleansing - multiple wounds 2 5 X - Wound Imaging (  photographs - any number of wounds) 1 5 []  - Wound Tracing (instead of photographs) 0 []  - Simple Wound Measurement - one wound 0 X - Complex Wound Measurement - multiple wounds 2 5 INTERVENTIONS - Wound Dressings []  - Small Wound Dressing one or multiple wounds 0 X - Medium Wound Dressing one or multiple wounds 2 15 []  - Large Wound Dressing one or multiple wounds 0 []  - Application of Medications - topical 0 []  - Application of Medications - injection 0 INTERVENTIONS - Miscellaneous []  - External ear exam 0 Wescoat, Astra L. ([id number]) []  - Specimen Collection (cultures, biopsies, blood, body fluids, etc.) 0 []  - Specimen(s) / Culture(s) sent or taken to Lab for analysis 0 []  - Patient Transfer (multiple staff / Michiel Sites Lift / Similar devices) 0 []  - Simple Staple / Suture removal (25 or less) 0 []  - Complex Staple / Suture removal (26 or more) 0 []  - Hypo / Hyperglycemic Management (close monitor of Blood Glucose) 0 []  - Ankle / Brachial Index (ABI) - do not check if billed separately 0 X - Vital Signs 1 5 Has the patient been seen at the hospital within the last three years: Yes Total Score: 110 Level Of Care: New/Established - Level 3 Electronic Signature(s) Signed: 05/27/2015 9:58:08 AM By: Elpidio Eric BSN, RN Entered By: Elpidio Eric on 05/27/2015 09:58:06 Gottsch, [name] ([id number]) -------------------------------------------------------------------------------- Encounter Discharge Information Details Patient Name: [name] Date of Service: 05/27/2015 9:30 AM Medical Record Number: [id number] Patient Account Number: [id number] Date of Birth/Sex: February 21, 1913 (81 y.o. Female) Treating RN: Clover Mealy, RN, BSN, [place] [employer] Primary Care Physician: PATIENT, NO Other Clinician: Referring Physician: Marisue Ivan Treating  Physician/Extender: Rudene Re in Treatment: 12 Encounter Discharge Information Items Discharge Pain Level: 0 Discharge Condition: Stable Ambulatory Status: Wheelchair Discharge Destination: Nursing Home Transportation: Private Auto Accompanied By: caregiver Schedule Follow-up Appointment: No Medication Reconciliation completed and provided to Patient/Care No Provider: Provided on Clinical Summary of Care: 05/27/2015 Form Type Recipient Paper Patient NP Electronic Signature(s) Signed: 05/27/2015 10:13:07 AM By: Gwenlyn Perking Previous Signature: 05/27/2015 9:59:19 AM Version By: Elpidio Eric BSN, RN Entered By: Gwenlyn Perking on 05/27/2015 10:13:07 Gerads, [name] ([id number]) -------------------------------------------------------------------------------- Lower Extremity Assessment Details Patient Name: [name] Date of Service: 05/27/2015 9:30 AM Medical Record Number: [id number] Patient Account Number: [id number] Date of Birth/Sex: 06-15-1913 (81 y.o. Female) Treating RN: [name], RN, BSN, [employer] Primary Care Physician: PATIENT, NO Other Clinician: Referring Physician: Marisue Ivan Treating Physician/Extender: Rudene Re in Treatment: 12 Edema Assessment Assessed: [Left: No] [Right: No] E[Left: dema] [Right: :] Calf Left: Right: Point of Measurement: 35 cm From Medial Instep 34.5 cm 25.6 cm Ankle Left: Right: Point of Measurement: 8 cm From Medial Instep 25.2 cm 25.5 cm Vascular Assessment Pulses: Posterior Tibial Dorsalis Pedis Palpable: [Left:Yes] [Right:Yes] Extremity colors, hair growth, and conditions: Extremity Color: [Left:Mottled] [Right:Mottled] Hair Growth on Extremity: [Left:No] [Right:No] Temperature of Extremity: [Left:Warm] [Right:Warm] Capillary Refill: [Left:< 3 seconds] [Right:< 3 seconds] Electronic Signature(s) Signed: 05/27/2015 9:51:17 AM By: Elpidio Eric BSN, RN Entered By: Elpidio Eric on 05/27/2015 09:51:16 Tillis,  [name] ([id number]) -------------------------------------------------------------------------------- Multi Wound Chart Details Patient Name: [name] Date of Service: 05/27/2015 9:30 AM Medical Record Number: [id number] Patient Account Number: [id number] Date of Birth/Sex: 10/05/1912 (81 y.o. Female) Treating RN: Clover Mealy, RN, BSN, [employer] Primary Care Physician: PATIENT, NO Other Clinician: Referring Physician: Marisue Ivan Treating Physician/Extender: Rudene Re in Treatment: 12 Vital Signs Height(in): 64 Pulse(bpm): 72 Weight(lbs): 147 Blood Pressure  130/77 (mmHg): Body Mass Index(BMI): 25 Temperature(F): 97.5 Respiratory Rate 18 (breaths/min): Photos: [1:No Photos] [3:No Photos] [N/A:N/A] Wound Location: [1:Left Lower Leg - Anterior Right Lower Leg - Midline, N/A] [3:Anterior] Wounding Event: [1:Trauma] [3:Blister] [N/A:N/A] Primary Etiology: [1:Skin Tear] [3:Lymphedema] [N/A:N/A] Comorbid History: [1:Hypertension, Osteoarthritis] [3:Hypertension, Osteoarthritis] [N/A:N/A] Date Acquired: [1:01/31/2015] [3:03/14/2015] [N/A:N/A] Weeks of Treatment: [1:12] [3:10] [N/A:N/A] Wound Status: [1:Open] [3:Open] [N/A:N/A] Measurements L x W x D 5.5x2.5x0.2 [3:2.3x1.2x0.2] [N/A:N/A] (cm) Area (cm) : [1:10.799] [3:2.168] [N/A:N/A] Volume (cm) : [1:2.16] [3:0.434] [N/A:N/A] % Reduction in Area: [1:69.80%] [3:54.00%] [N/A:N/A] % Reduction in Volume: 79.90% [3:7.90%] [N/A:N/A] Classification: [1:Full Thickness Without Exposed Support Structures] [3:Partial Thickness] [N/A:N/A] Exudate Amount: [1:Medium] [3:Small] [N/A:N/A] Exudate Type: [1:Serosanguineous] [3:Serosanguineous] [N/A:N/A] Exudate Color: [1:red, brown] [3:red, brown] [N/A:N/A] Wound Margin: [1:Distinct, outline attached Indistinct, nonvisible] [N/A:N/A] Granulation Amount: [1:Medium (34-66%)] [3:Medium (34-66%)] [N/A:N/A] Granulation Quality: [1:Pink, Pale] [3:Pink, Pale] [N/A:N/A] Necrotic Amount:  [1:Medium (34-66%)] [3:Small (1-33%)] [N/A:N/A] Necrotic Tissue: [1:Eschar, Adherent Slough Adherent Slough] [N/A:N/A] Exposed Structures: [1:Fascia: No Fat: No Tendon: No] [3:Fascia: No Fat: No Tendon: No] [N/A:N/A] Muscle: No Muscle: No Joint: No Joint: No Bone: No Bone: No Limited to Skin Limited to Skin Breakdown Breakdown Epithelialization: Small (1-33%) Large (67-100%) N/A Periwound Skin Texture: Edema: Yes Edema: Yes N/A Excoriation: No Excoriation: No Induration: No Induration: No Callus: No Callus: No Crepitus: No Crepitus: No Fluctuance: No Fluctuance: No Friable: No Friable: No Rash: No Rash: No Scarring: No Scarring: No Periwound Skin Moist: Yes Moist: Yes N/A Moisture: Maceration: No Maceration: No Dry/Scaly: No Dry/Scaly: No Periwound Skin Color: Atrophie Blanche: No Atrophie Blanche: No N/A Cyanosis: No Cyanosis: No Ecchymosis: No Ecchymosis: No Erythema: No Erythema: No Hemosiderin Staining: No Hemosiderin Staining: No Mottled: No Mottled: No Pallor: No Pallor: No Rubor: No Rubor: No Temperature: No Abnormality No Abnormality N/A Tenderness on Yes Yes N/A Palpation: Wound Preparation: Ulcer Cleansing: Ulcer Cleansing: N/A Rinsed/Irrigated with Rinsed/Irrigated with Saline Saline Topical Anesthetic Topical Anesthetic Applied: Other: Lidocaine Applied: Other: lidocaine 4% 4% Treatment Notes Electronic Signature(s) Signed: 05/27/2015 9:53:04 AM By: Elpidio Eric BSN, RN Entered By: Elpidio Eric on 05/27/2015 09:53:04 Griesinger, [name] ([id number]) -------------------------------------------------------------------------------- Multi-Disciplinary Care Plan Details Patient Name: [name] Date of Service: 05/27/2015 9:30 AM Medical Record Number: [id number] Patient Account Number: [id number] Date of Birth/Sex: [birth year] (81 y.o. Female) Treating RN: [name], RN, BSN, [employer] Primary Care Physician: PATIENT, NO Other  Clinician: Referring Physician: Marisue Ivan Treating Physician/Extender: Rudene Re in Treatment: 12 Active Inactive Abuse / Safety / Falls / Self Care Management Nursing Diagnoses: Impaired home maintenance Impaired physical mobility Potential for falls Self care deficit: actual or potential Goals: Patient will remain injury free Date Initiated: 03/03/2015 Goal Status: Active Patient/caregiver will verbalize understanding of skin care regimen Date Initiated: 03/03/2015 Goal Status: Active Patient/caregiver will verbalize/demonstrate measure taken to improve self care Date Initiated: 03/03/2015 Goal Status: Active Patient/caregiver will verbalize/demonstrate measures taken to improve the patient's personal safety Date Initiated: 03/03/2015 Goal Status: Active Patient/caregiver will verbalize/demonstrate measures taken to prevent injury and/or falls Date Initiated: 03/03/2015 Goal Status: Active Patient/caregiver will verbalize/demonstrate understanding of what to do in case of emergency Date Initiated: 03/03/2015 Goal Status: Active Interventions: Assess: immobility, friction, shearing, incontinence upon admission and as needed Assess impairment of mobility on admission and as needed per policy Provide education on basic hygiene Provide education on personal and home safety Provide education on safe transfers KEIRY, KOWAL ([id number]) Treatment Activities: Education provided on Basic Hygiene : 03/10/2015 Notes: Orientation to the Wound Care  Program Nursing Diagnoses: Knowledge deficit related to the wound healing center program Goals: Patient/caregiver will verbalize understanding of the Wound Healing Center Program Date Initiated: 03/03/2015 Goal Status: Active Interventions: Provide education on orientation to the wound center Notes: Venous Leg Ulcer Nursing Diagnoses: Knowledge deficit related to disease process and management Potential for venous  Insuffiency (use before diagnosis confirmed) Goals: Patient will maintain optimal edema control Date Initiated: 03/03/2015 Goal Status: Active Patient/caregiver will verbalize understanding of disease process and disease management Date Initiated: 03/03/2015 Goal Status: Active Verify adequate tissue perfusion prior to therapeutic compression application Date Initiated: 03/03/2015 Goal Status: Active Interventions: Assess peripheral edema status every visit. Compression as ordered Provide education on venous insufficiency Treatment Activities: Test ordered outside of clinic : 05/27/2015 Notes: MARIAEDUARDA, DEFRANCO ([id number]) Wound/Skin Impairment Nursing Diagnoses: Impaired tissue integrity Knowledge deficit related to ulceration/compromised skin integrity Goals: Patient/caregiver will verbalize understanding of skin care regimen Date Initiated: 03/03/2015 Goal Status: Active Ulcer/skin breakdown will have a volume reduction of 30% by week 4 Date Initiated: 03/03/2015 Goal Status: Active Ulcer/skin breakdown will have a volume reduction of 50% by week 8 Date Initiated: 03/03/2015 Goal Status: Active Ulcer/skin breakdown will have a volume reduction of 80% by week 12 Date Initiated: 03/03/2015 Goal Status: Active Ulcer/skin breakdown will heal within 14 weeks Date Initiated: 03/03/2015 Goal Status: Active Interventions: Assess patient/caregiver ability to perform ulcer/skin care regimen upon admission and as needed Assess ulceration(s) every visit Provide education on ulcer and skin care Notes: Electronic Signature(s) Signed: 05/27/2015 9:52:26 AM By: Elpidio Eric BSN, RN Entered By: Elpidio Eric on 05/27/2015 09:52:26 Raimondi, [name] ([id number]) -------------------------------------------------------------------------------- Pain Assessment Details Patient Name: [name] Date of Service: 05/27/2015 9:30 AM Medical Record Number: [id number] Patient Account Number: [id number] Date  of Birth/Sex: 12-Sep-1912 (81 y.o. Female) Treating RN: Clover Mealy, RN, BSN, [employer] Primary Care Physician: PATIENT, NO Other Clinician: Referring Physician: Marisue Ivan Treating Physician/Extender: Rudene Re in Treatment: 12 Active Problems Location of Pain Severity and Description of Pain Patient Has Paino No Site Locations Pain Management and Medication Current Pain Management: Electronic Signature(s) Signed: 05/27/2015 9:43:20 AM By: Elpidio Eric BSN, RN Entered By: Elpidio Eric on 05/27/2015 09:43:20 Mcgaha, [name] ([id number]) -------------------------------------------------------------------------------- Wound Assessment Details Patient Name: [name] Date of Service: 05/27/2015 9:30 AM Medical Record Number: [id number] Patient Account Number: [id number] Date of Birth/Sex: 1913/03/24 (81 y.o. Female) Treating RN: [name], RN, BSN, Psychologist, clinical Primary Care Physician: PATIENT, NO Other Clinician: Referring Physician: Marisue Ivan Treating Physician/Extender: Rudene Re in Treatment: 12 Wound Status Wound Number: 1 Primary Etiology: Skin Tear Wound Location: Left Lower Leg - Anterior Wound Status: Open Wounding Event: Trauma Comorbid History: Hypertension, Osteoarthritis Date Acquired: 01/31/2015 Weeks Of Treatment: 12 Clustered Wound: No Photos Photo Uploaded By: Elpidio Eric on 05/27/2015 14:34:31 Wound Measurements Length: (cm) 5.5 Width: (cm) 2.5 Depth: (cm) 0.2 Area: (cm) 10.799 Volume: (cm) 2.16 % Reduction in Area: 69.8% % Reduction in Volume: 79.9% Epithelialization: Small (1-33%) Tunneling: No Undermining: No Wound Description Full Thickness Without Exposed Classification: Support Structures Wound Margin: Distinct, outline attached Exudate Medium Amount: Exudate Type: Serosanguineous Exudate Color: red, brown Foul Odor After Cleansing: No Wound Bed Granulation Amount: Medium (34-66%) Exposed Structure Granulation  Quality: Pink, Pale Fascia Exposed: No Necrotic Amount: Medium (34-66%) Fat Layer Exposed: No Inthavong, Reve L. ([id number]) Necrotic Quality: Eschar, Adherent Slough Tendon Exposed: No Muscle Exposed: No Joint Exposed: No Bone Exposed: No Limited to Skin Breakdown Periwound Skin Texture Texture Color No Abnormalities Noted: No  No Abnormalities Noted: No Callus: No Atrophie Blanche: No Crepitus: No Cyanosis: No Excoriation: No Ecchymosis: No Fluctuance: No Erythema: No Friable: No Hemosiderin Staining: No Induration: No Mottled: No Localized Edema: Yes Pallor: No Rash: No Rubor: No Scarring: No Temperature / Pain Moisture Temperature: No Abnormality No Abnormalities Noted: No Tenderness on Palpation: Yes Dry / Scaly: No Maceration: No Moist: Yes Wound Preparation Ulcer Cleansing: Rinsed/Irrigated with Saline Topical Anesthetic Applied: Other: Lidocaine 4%, Treatment Notes Wound #1 (Left, Anterior Lower Leg) 1. Cleansed with: Clean wound with Normal Saline 4. Dressing Applied: Hydrafera Blue 5. Secondary Dressing Applied Gauze and Kerlix/Conform 7. Secured with Tape Other (specify in notes) Notes Ace wrap Electronic Signature(s) Signed: 05/27/2015 9:49:24 AM By: Elpidio Eric BSN, RN Entered By: Elpidio Eric on 05/27/2015 09:49:23 Salonga, [name] ([id number]) Seawright, [name] ([id number]) -------------------------------------------------------------------------------- Wound Assessment Details Patient Name: [name] Date of Service: 05/27/2015 9:30 AM Medical Record Number: [id number] Patient Account Number: [id number] Date of Birth/Sex: [birth year] (81 y.o. Female) Treating RN: [name], RN, BSN, Psychologist, clinical Primary Care Physician: PATIENT, NO Other Clinician: Referring Physician: Marisue Ivan Treating Physician/Extender: Rudene Re in Treatment: 12 Wound Status Wound Number: 3 Primary Etiology: Lymphedema Wound Location: Right Lower Leg -  Midline, Wound Status: Open Anterior Comorbid History: Hypertension, Osteoarthritis Wounding Event: Blister Date Acquired: 03/14/2015 Weeks Of Treatment: 10 Clustered Wound: No Photos Photo Uploaded By: Elpidio Eric on 05/27/2015 14:34:32 Wound Measurements Length: (cm) 2.3 Width: (cm) 1.2 Depth: (cm) 0.2 Area: (cm) 2.168 Volume: (cm) 0.434 % Reduction in Area: 54% % Reduction in Volume: 7.9% Epithelialization: Large (67-100%) Tunneling: No Undermining: No Wound Description Classification: Partial Thickness Wound Margin: Indistinct, nonvisible Exudate Amount: Small Exudate Type: Serosanguineous Exudate Color: red, brown Foul Odor After Cleansing: No Wound Bed Granulation Amount: Medium (34-66%) Exposed Structure Granulation Quality: Pink, Pale Fascia Exposed: No Necrotic Amount: Small (1-33%) Fat Layer Exposed: No Margerum, Rakel L. ([id number]) Necrotic Quality: Adherent Slough Tendon Exposed: No Muscle Exposed: No Joint Exposed: No Bone Exposed: No Limited to Skin Breakdown Periwound Skin Texture Texture Color No Abnormalities Noted: No No Abnormalities Noted: No Callus: No Atrophie Blanche: No Crepitus: No Cyanosis: No Excoriation: No Ecchymosis: No Fluctuance: No Erythema: No Friable: No Hemosiderin Staining: No Induration: No Mottled: No Localized Edema: Yes Pallor: No Rash: No Rubor: No Scarring: No Temperature / Pain Moisture Temperature: No Abnormality No Abnormalities Noted: No Tenderness on Palpation: Yes Dry / Scaly: No Maceration: No Moist: Yes Wound Preparation Ulcer Cleansing: Rinsed/Irrigated with Saline Topical Anesthetic Applied: Other: lidocaine 4%, Treatment Notes Wound #3 (Right, Midline, Anterior Lower Leg) 1. Cleansed with: Clean wound with Normal Saline 4. Dressing Applied: Hydrafera Blue 5. Secondary Dressing Applied Gauze and Kerlix/Conform 7. Secured with Tape Other (specify in notes) Notes Ace  wrap Electronic Signature(s) Signed: 05/27/2015 9:49:42 AM By: Elpidio Eric BSN, RN Entered By: Elpidio Eric on 05/27/2015 09:49:42 Leary, [name] ([id number]) Crowell, [name] ([id number]) -------------------------------------------------------------------------------- Vitals Details Patient Name: [name] Date of Service: 05/27/2015 9:30 AM Medical Record Number: [id number] Patient Account Number: [id number] Date of Birth/Sex: 1913-04-25 (81 y.o. Female) Treating RN: [name], RN, BSN, Rita Primary Care Physician: PATIENT, NO Other Clinician: Referring Physician: Marisue Ivan Treating Physician/Extender: Rudene Re in Treatment: 12 Vital Signs Time Taken: 09:43 Temperature (F): 97.5 Height (in): 64 Pulse (bpm): 72 Weight (lbs): 147 Respiratory Rate (breaths/min): 18 Body Mass Index (BMI): 25.2 Blood Pressure (mmHg): 130/77 Reference Range: 80 - 120 mg / dl Electronic Signature(s) Signed: 05/27/2015 9:43:49 AM By:  [name], [place] Sink BSN, RN Entered By: Elpidio Eric on 05/27/2015 09:43:49

## 2015-05-28 NOTE — Progress Notes (Signed)
[name], [name] ([id number]) Visit Report for 05/27/2015 Chief Complaint Document Details Patient Name: [name], [name] Date of Service: 05/27/2015 9:30 AM Medical Record Patient Account Number: [id number] Number: [name], RN, BSN, Treating RN: [birth year] (81 y.o. [place] Sink Date of Birth/Sex: Female) Other Clinician: Primary Care Physician: PATIENT, NO Treating Britto, Errol Referring Physician: Marisue Ivan Physician/Extender: Weeks in Treatment: 12 Information Obtained from: Patient Chief Complaint Patient seen for complaints of Non-Healing Wound. pleasant 81 year old patient who had a fall and injured both lower extremities on 01/31/2015 Electronic Signature(s) Signed: 05/27/2015 10:24:34 AM By: Evlyn Kanner MD, FACS Entered By: Evlyn Kanner on 05/27/2015 10:24:33 Harker, [name] ([id number]) -------------------------------------------------------------------------------- HPI Details Patient Name: [name] Date of Service: 05/27/2015 9:30 AM Medical Record Patient Account Number: [id number] Number: [name], RN, BSN, Treating RN: 02/27/1913 (81 y.o. [place] Sink Date of Birth/Sex: Female) Other Clinician: Primary Care Physician: PATIENT, NO Treating Britto, Errol Referring Physician: Marisue Ivan Physician/Extender: Weeks in Treatment: 12 History of Present Illness Location: both lower extremities Quality: Patient reports experiencing a dull pain to affected area(s). Severity: Patient states wound (s) are getting better. Duration: Patient has had the wound for < 4 weeks prior to presenting for treatment Timing: Pain in wound is Intermittent (comes and goes Context: The wound occurred when the patient had a fall and lacerated both lower extremities. Modifying Factors: Other treatment(s) tried include:she had sutures applied there for a while and when they were removed the wound opened up. Associated Signs and Symptoms: Patient reports having  difficulty standing for long periods. HPI Description: 81 year old female who fell and had an injury to her right forehead, right elbow and both shins on 01/31/2015. She went to the ER at [facility] and had been treated appropriately with sutures being placed to her right and left lower extremity. Also had a CT scan which showed no intracranial hemorrhage and only a large scalp hematoma on the right frontal region.she was put on Keflex for 5 days. she had also received Bactrim for 14 days.Since then she has been seen by her PCP and he had put her on Augmentin for 14 days and wound culture was obtained. culture reports were reviewed -- she grew an MRSA sensitive to tetracycline, Bactrim and vancomycin. Her past medical history significant for osteoporosis, hypertension, spinal stenosis, right hip replacement, appendectomy and abdomen hysterectomy for ovarian cancer. She has been seen in the past by the vascular surgery group at [facility] and she has an appointment to see them again. She is known to use lymphedema pumps while she has been in her assisted living home but has not been using them for the last month. She does also have compression stockings which she uses intermittently. 03/17/2015 -- She was seen by Dr. Gilda Crease on 03/03/2015 -- he reviewed the patientos case and recommended no surgical intervention. He has recommended compression stockings of the 20-30 mmHg variety and also recommended lymphedema pumps. He will see her back in 3 months. The patient has developed some blisters on both lower extremities and this may be due to the fact that she's had a Kerlix gauze wrap on both lower extremities and is also using the lymphedema pumps. 05/09/2015 -- she has a new wound on the dorsum of the right foot just proximal to the toes but this was probably a blister which opened out and is very superficial. Electronic Signature(s) Signed: 05/27/2015 10:24:39 AM By: Evlyn Kanner  MD, FACS Entered By: Evlyn Kanner on 05/27/2015 10:24:38 Reames, Pavielle L. ([id number])  [name], [name] ([id number]) -------------------------------------------------------------------------------- Physical Exam Details Patient Name: FLORENTINE, DIEKMAN Date of Service: 05/27/2015 9:30 AM Medical Record Patient Account Number: [id number] Number: [name], RN, BSN, Treating RN: 16-Dec-1912 [phone] y.o. [place] Sink Date of Birth/Sex: Female) Other Clinician: Primary Care Physician: PATIENT, NO Treating Britto, Errol Referring Physician: Marisue Ivan Physician/Extender: Weeks in Treatment: 12 Constitutional . Pulse regular. Respirations normal and unlabored. Afebrile. . Eyes Nonicteric. Reactive to light. Ears, Nose, Mouth, and Throat Lips, teeth, and gums WNL.Marland Kitchen Moist mucosa without lesions . Neck supple and nontender. No palpable supraclavicular or cervical adenopathy. Normal sized without goiter. Respiratory WNL. No retractions.. Cardiovascular Pedal Pulses WNL. No clubbing, cyanosis or edema. Lymphatic No adneopathy. No adenopathy. No adenopathy. Musculoskeletal Adexa without tenderness or enlargement.. Digits and nails w/o clubbing, cyanosis, infection, petechiae, ischemia, or inflammatory conditions.. Integumentary (Hair, Skin) No suspicious lesions. No crepitus or fluctuance. No peri-wound warmth or erythema. No masses.Marland Kitchen Psychiatric Judgement and insight Intact.. No evidence of depression, anxiety, or agitation.. Notes Her edema has gone down significantly and the wounds on both lower extremities are looking better and smaller. Electronic Signature(s) Signed: 05/27/2015 10:25:21 AM By: Evlyn Kanner MD, FACS Entered By: Evlyn Kanner on 05/27/2015 10:25:21 Kirt, [name] ([id number]) -------------------------------------------------------------------------------- Physician Orders Details Patient Name: [name] Date of Service: 05/27/2015 9:30 AM Medical Record  Patient Account Number: [id number] Number: [name], RN, BSN, Treating RN: 09-12-1912 (81 y.o. [place] Sink Date of Birth/Sex: Female) Other Clinician: Primary Care Physician: PATIENT, NO Treating Britto, Errol Referring Physician: Marisue Ivan Physician/Extender: Tania Ade in Treatment: 12 Verbal / Phone Orders: Yes Clinician: [name], RN, BSN, Rita Read Back and Verified: Yes Diagnosis Coding Wound Cleansing Wound #1 Left,Anterior Lower Leg o Clean wound with Normal Saline. Wound #3 Right,Midline,Anterior Lower Leg o Clean wound with Normal Saline. Skin Barriers/Peri-Wound Care Wound #1 Left,Anterior Lower Leg o Moisturizing lotion Wound #3 Right,Midline,Anterior Lower Leg o Moisturizing lotion Primary Wound Dressing Wound #1 Left,Anterior Lower Leg o Hydrafera Blue Wound #3 Right,Midline,Anterior Lower Leg o Hydrafera Blue Secondary Dressing Wound #1 Left,Anterior Lower Leg o Gauze and Kerlix/Conform Wound #3 Right,Midline,Anterior Lower Leg o Gauze and Kerlix/Conform Dressing Change Frequency Wound #1 Left,Anterior Lower Leg o Change dressing every other day. Wound #3 Right,Midline,Anterior Lower Leg o Change dressing every other day. [name], [name] ([id number]) Follow-up Appointments Wound #1 Left,Anterior Lower Leg o Return Appointment in 1 week. Wound #3 Right,Midline,Anterior Lower Leg o Return Appointment in 1 week. Edema Control Wound #1 Left,Anterior Lower Leg o Other: - ace wrap Wound #3 Right,Midline,Anterior Lower Leg o Other: - ace wrap Home Health Wound #1 Left,Anterior Lower Leg o Continue Home Health Visits - [facility] o Home Health Nurse may visit PRN to address patientos wound care needs. o FACE TO FACE ENCOUNTER: MEDICARE and MEDICAID PATIENTS: I certify that this patient is under my care and that I had a face-to-face encounter that meets the physician face-to-face encounter requirements with this patient  on this date. The encounter with the patient was in whole or in part for the following MEDICAL CONDITION: (primary reason for Home Healthcare) MEDICAL NECESSITY: I certify, that based on my findings, NURSING services are a medically necessary home health service. HOME BOUND STATUS: I certify that my clinical findings support that this patient is homebound (i.e., Due to illness or injury, pt requires aid of supportive devices such as crutches, cane, wheelchairs, walkers, the use of special transportation or the assistance of another person to leave their place of residence. There is a normal  inability to leave the home and doing so requires considerable and taxing effort. Other absences are for medical reasons / religious services and are infrequent or of short duration when for other reasons). o If current dressing causes regression in wound condition, may D/C ordered dressing product/s and apply Normal Saline Moist Dressing daily until next Wound Healing Center / Other MD appointment. Notify Wound Healing Center of regression in wound condition at [phone]. o Please direct any NON-WOUND related issues/requests for orders to patient's Primary Care Physician Wound #3 Right,Midline,Anterior Lower Leg o Continue Home Health Visits - [facility] o Home Health Nurse may visit PRN to address patientos wound care needs. o FACE TO FACE ENCOUNTER: MEDICARE and MEDICAID PATIENTS: I certify that this patient is under my care and that I had a face-to-face encounter that meets the physician face-to-face encounter requirements with this patient on this date. The encounter with the patient was in whole or in part for the following MEDICAL CONDITION: (primary reason for Home Healthcare) MEDICAL NECESSITY: I certify, that based on my findings, NURSING services are a medically necessary home health service. HOME BOUND STATUS: I certify that my clinical findings support that this patient is homebound  (i.e., Due to illness or injury, pt requires aid of supportive devices such as crutches, cane, wheelchairs, walkers, the use of special transportation or the assistance of another person to leave their place of residence. There is a Consiglio, Livy L. ([id number]) normal inability to leave the home and doing so requires considerable and taxing effort. Other absences are for medical reasons / religious services and are infrequent or of short duration when for other reasons). o If current dressing causes regression in wound condition, may D/C ordered dressing product/s and apply Normal Saline Moist Dressing daily until next Wound Healing Center / Other MD appointment. Notify Wound Healing Center of regression in wound condition at [phone]. o Please direct any NON-WOUND related issues/requests for orders to patient's Primary Care Physician Electronic Signature(s) Signed: 05/27/2015 9:57:09 AM By: Elpidio Eric BSN, RN Signed: 05/27/2015 4:17:16 PM By: Evlyn Kanner MD, FACS Entered By: Elpidio Eric on 05/27/2015 09:57:09 Marchi, [name] ([id number]) -------------------------------------------------------------------------------- Problem List Details Patient Name: [name] Date of Service: 05/27/2015 9:30 AM Medical Record Patient Account Number: [id number] Number: [name], RN, BSN, Treating RN: [birth year] (81 y.o. 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/03/2015 Yes exposed L97.222 Non-pressure chronic ulcer of left calf with fat layer 03/03/2015 Yes exposed I89.0 Lymphedema, not elsewhere classified 03/03/2015 Yes L97.511 Non-pressure chronic ulcer of other part of right foot  04/29/2015 Yes limited to breakdown of skin Inactive Problems Resolved Problems Electronic Signature(s) Signed: 05/27/2015 10:24:18 AM By: Evlyn Kanner MD, FACS Entered By: Evlyn Kanner on 05/27/2015 10:24:18 Finck, [name] ([id number]) -------------------------------------------------------------------------------- Progress Note Details Patient Name: [name] Date of Service: 05/27/2015 9:30 AM Medical Record Patient Account Number: [id number] Number: [name], RN, BSN, Treating RN: 06/12/1913 (81 y.o. [place] Sink Date of Birth/Sex: Female) Other Clinician: Primary Care Physician: PATIENT, NO Treating Britto, Errol Referring Physician: Marisue Ivan Physician/Extender: Weeks in Treatment: 12 Subjective Chief Complaint Information obtained from Patient Patient seen for complaints of Non-Healing Wound. pleasant 81 year old patient who had a fall and injured both lower extremities on 01/31/2015 History of Present Illness (HPI) The following HPI  elements were documented for the patient's wound: Location: both lower extremities Quality: Patient reports experiencing a dull pain to affected area(s). Severity: Patient states wound (s) are getting better. Duration: Patient has had the wound for < 4 weeks prior to presenting for treatment Timing: Pain in wound is Intermittent (comes and goes Context: The wound occurred when the patient had a fall and lacerated both lower extremities. Modifying Factors: Other treatment(s) tried include:she had sutures applied there for a while and when they were removed the wound opened up. Associated Signs and Symptoms: Patient reports having difficulty standing for long periods. 81 year old female who fell and had an injury to her right forehead, right elbow and both shins on 01/31/2015. She went to the ER at [facility] and had been treated appropriately with sutures being placed to her right and left lower extremity. Also had a CT  scan which showed no intracranial hemorrhage and only a large scalp hematoma on the right frontal region.she was put on Keflex for 5 days. she had also received Bactrim for 14 days.Since then she has been seen by her PCP and he had put her on Augmentin for 14 days and wound culture was obtained. culture reports were reviewed -- she grew an MRSA sensitive to tetracycline, Bactrim and vancomycin. Her past medical history significant for osteoporosis, hypertension, spinal stenosis, right hip replacement, appendectomy and abdomen hysterectomy for ovarian cancer. She has been seen in the past by the vascular surgery group at [facility] and she has an appointment to see them again. She is known to use lymphedema pumps while she has been in her assisted living home but has not been using them for the last month. She does also have compression stockings which she uses intermittently. 03/17/2015 -- She was seen by Dr. Gilda Crease on 03/03/2015 -- he reviewed the patient s case and recommended no surgical intervention. He has recommended compression stockings of the 20-30 mmHg variety and also recommended lymphedema pumps. He will see her back in 3 months. The patient has developed some blisters on both lower extremities and this may be due to the fact that [name], Leah L. ([id number]) she's had a Kerlix gauze wrap on both lower extremities and is also using the lymphedema pumps. 05/09/2015 -- she has a new wound on the dorsum of the right foot just proximal to the toes but this was probably a blister which opened out and is very superficial. Objective Constitutional Pulse regular. Respirations normal and unlabored. Afebrile. Vitals Time Taken: 9:43 AM, Height: 64 in, Weight: 147 lbs, BMI: 25.2, Temperature: 97.5 F, Pulse: 72 bpm, Respiratory Rate: 18 breaths/min, Blood Pressure: 130/77 mmHg. Eyes Nonicteric. Reactive to light. Ears, Nose, Mouth, and Throat Lips, teeth, and gums WNL.Marland Kitchen Moist  mucosa without lesions . Neck supple and nontender. No palpable supraclavicular or cervical adenopathy. Normal sized without goiter. Respiratory WNL. No retractions.. Cardiovascular Pedal Pulses WNL. No clubbing, cyanosis or edema. Lymphatic No adneopathy. No adenopathy. No adenopathy. Musculoskeletal Adexa without tenderness or enlargement.. Digits and nails w/o clubbing, cyanosis, infection, petechiae, ischemia, or inflammatory conditions.Marland Kitchen Psychiatric Judgement and insight Intact.. No evidence of depression, anxiety, or agitation.. General Notes: Her edema has gone down significantly and the wounds on both lower extremities are looking better and smaller. Integumentary (Hair, Skin) No suspicious lesions. No crepitus or fluctuance. No peri-wound warmth or erythema. No masses.Marland Kitchen [name], Leah L. ([id number]) Wound #1 status is Open. Original cause of wound was Trauma. The wound is located on the Left,Anterior Lower Leg. The  wound measures 5.5cm length x 2.5cm width x 0.2cm depth; 10.799cm^2 area and 2.16cm^3 volume. The wound is limited to skin breakdown. There is no tunneling or undermining noted. There is a medium amount of serosanguineous drainage noted. The wound margin is distinct with the outline attached to the wound base. There is medium (34-66%) pink, pale granulation within the wound bed. There is a medium (34-66%) amount of necrotic tissue within the wound bed including Eschar and Adherent Slough. The periwound skin appearance exhibited: Localized Edema, Moist. The periwound skin appearance did not exhibit: Callus, Crepitus, Excoriation, Fluctuance, Friable, Induration, Rash, Scarring, Dry/Scaly, Maceration, Atrophie Blanche, Cyanosis, Ecchymosis, Hemosiderin Staining, Mottled, Pallor, Rubor, Erythema. Periwound temperature was noted as No Abnormality. The periwound has tenderness on palpation. Wound #3 status is Open. Original cause of wound was Blister. The wound is located  on the Right,Midline,Anterior Lower Leg. The wound measures 2.3cm length x 1.2cm width x 0.2cm depth; 2.168cm^2 area and 0.434cm^3 volume. The wound is limited to skin breakdown. There is no tunneling or undermining noted. There is a small amount of serosanguineous drainage noted. The wound margin is indistinct and nonvisible. There is medium (34-66%) pink, pale granulation within the wound bed. There is a small (1-33%) amount of necrotic tissue within the wound bed including Adherent Slough. The periwound skin appearance exhibited: Localized Edema, Moist. The periwound skin appearance did not exhibit: Callus, Crepitus, Excoriation, Fluctuance, Friable, Induration, Rash, Scarring, Dry/Scaly, Maceration, Atrophie Blanche, Cyanosis, Ecchymosis, Hemosiderin Staining, Mottled, Pallor, Rubor, Erythema. Periwound temperature was noted as No Abnormality. The periwound has tenderness on palpation. Assessment Active Problems ICD-10 L97.212 - Non-pressure chronic ulcer of right calf with fat layer exposed L97.222 - Non-pressure chronic ulcer of left calf with fat layer exposed I89.0 - Lymphedema, not elsewhere classified L97.511 - Non-pressure chronic ulcer of other part of right foot limited to breakdown of skin We will continue with Hydrofera Blue and use compression with Ace bandages bilaterally so that this helps control her edema. She is also using her lymphedema pumps. She will come back and see as next week. Plan Wound Cleansing: Wound #1 Left,Anterior Lower Leg: Clean wound with Normal Saline. [name], Leah L. ([id number]) Wound #3 Right,Midline,Anterior Lower Leg: Clean wound with Normal Saline. Skin Barriers/Peri-Wound Care: Wound #1 Left,Anterior Lower Leg: Moisturizing lotion Wound #3 Right,Midline,Anterior Lower Leg: Moisturizing lotion Primary Wound Dressing: Wound #1 Left,Anterior Lower Leg: Hydrafera Blue Wound #3 Right,Midline,Anterior Lower Leg: Hydrafera Blue Secondary  Dressing: Wound #1 Left,Anterior Lower Leg: Gauze and Kerlix/Conform Wound #3 Right,Midline,Anterior Lower Leg: Gauze and Kerlix/Conform Dressing Change Frequency: Wound #1 Left,Anterior Lower Leg: Change dressing every other day. Wound #3 Right,Midline,Anterior Lower Leg: Change dressing every other day. Follow-up Appointments: Wound #1 Left,Anterior Lower Leg: Return Appointment in 1 week. Wound #3 Right,Midline,Anterior Lower Leg: Return Appointment in 1 week. Edema Control: Wound #1 Left,Anterior Lower Leg: Other: - ace wrap Wound #3 Right,Midline,Anterior Lower Leg: Other: - ace wrap Home Health: Wound #1 Left,Anterior Lower Leg: Continue Home Health Visits - [facility] Health Nurse may visit PRN to address patient s wound care needs. FACE TO FACE ENCOUNTER: MEDICARE and MEDICAID PATIENTS: I certify that this patient is under my care and that I had a face-to-face encounter that meets the physician face-to-face encounter requirements with this patient on this date. The encounter with the patient was in whole or in part for the following MEDICAL CONDITION: (primary reason for Home Healthcare) MEDICAL NECESSITY: I certify, that based on my findings, NURSING services are a medically necessary  home health service. HOME BOUND STATUS: I certify that my clinical findings support that this patient is homebound (i.e., Due to illness or injury, pt requires aid of supportive devices such as crutches, cane, wheelchairs, walkers, the use of special transportation or the assistance of another person to leave their place of residence. There is a normal inability to leave the home and doing so requires considerable and taxing effort. Other absences are for medical reasons / religious services and are infrequent or of short duration when for other reasons). If current dressing causes regression in wound condition, may D/C ordered dressing product/s and apply Normal Saline Moist Dressing  daily until next Wound Healing Center / Other MD appointment. Notify Wound Healing Center of regression in wound condition at [phone]. Please direct any NON-WOUND related issues/requests for orders to patient's Primary Care Physician Wound #3 Right,Midline,Anterior Lower Leg: [name], [name] ([id number]) Continue Home Health Visits - [facility] Health Nurse may visit PRN to address patient s wound care needs. FACE TO FACE ENCOUNTER: MEDICARE and MEDICAID PATIENTS: I certify that this patient is under my care and that I had a face-to-face encounter that meets the physician face-to-face encounter requirements with this patient on this date. The encounter with the patient was in whole or in part for the following MEDICAL CONDITION: (primary reason for Home Healthcare) MEDICAL NECESSITY: I certify, that based on my findings, NURSING services are a medically necessary home health service. HOME BOUND STATUS: I certify that my clinical findings support that this patient is homebound (i.e., Due to illness or injury, pt requires aid of supportive devices such as crutches, cane, wheelchairs, walkers, the use of special transportation or the assistance of another person to leave their place of residence. There is a normal inability to leave the home and doing so requires considerable and taxing effort. Other absences are for medical reasons / religious services and are infrequent or of short duration when for other reasons). If current dressing causes regression in wound condition, may D/C ordered dressing product/s and apply Normal Saline Moist Dressing daily until next Wound Healing Center / Other MD appointment. Notify Wound Healing Center of regression in wound condition at [phone]. Please direct any NON-WOUND related issues/requests for orders to patient's Primary Care Physician We will continue with [facility] and use compression with Ace bandages bilaterally so that this  helps control her edema. She is also using her lymphedema pumps. She will come back and see as next week. Electronic Signature(s) Signed: 05/27/2015 10:26:10 AM By: Evlyn Kanner MD, FACS Entered By: Evlyn Kanner on 05/27/2015 10:26:10 Coppock, [name] ([id number]) -------------------------------------------------------------------------------- SuperBill Details Patient Name: [name] Date of Service: 05/27/2015 Medical Record Patient Account Number: [id number] Number: [name], RN, BSN, Treating RN: [birth year] (81 y.o. 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 I89.0 Lymphedema, not elsewhere classified L97.511 Non-pressure chronic ulcer of other part of right foot limited to breakdown of skin Facility Procedures CPT4 Code: 27253664 Description: 99213 - WOUND CARE VISIT-LEV 3 EST PT Modifier: Quantity: 1 Physician Procedures CPT4 Code: 4034742 Description: 99213 - WC PHYS LEVEL 3 - EST PT ICD-10 Description Diagnosis 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

## 2015-06-02 ENCOUNTER — Encounter: Payer: Medicare Other | Attending: General Surgery | Admitting: General Surgery

## 2015-06-02 DIAGNOSIS — M1990 Unspecified osteoarthritis, unspecified site: Secondary | ICD-10-CM | POA: Diagnosis not present

## 2015-06-02 DIAGNOSIS — S81801D Unspecified open wound, right lower leg, subsequent encounter: Secondary | ICD-10-CM

## 2015-06-02 DIAGNOSIS — L97222 Non-pressure chronic ulcer of left calf with fat layer exposed: Secondary | ICD-10-CM | POA: Diagnosis not present

## 2015-06-02 DIAGNOSIS — I10 Essential (primary) hypertension: Secondary | ICD-10-CM | POA: Diagnosis not present

## 2015-06-02 DIAGNOSIS — L97212 Non-pressure chronic ulcer of right calf with fat layer exposed: Secondary | ICD-10-CM | POA: Diagnosis not present

## 2015-06-02 DIAGNOSIS — L97511 Non-pressure chronic ulcer of other part of right foot limited to breakdown of skin: Secondary | ICD-10-CM | POA: Diagnosis not present

## 2015-06-02 DIAGNOSIS — I890 Lymphedema, not elsewhere classified: Secondary | ICD-10-CM | POA: Diagnosis not present

## 2015-06-02 NOTE — Progress Notes (Signed)
seeiheal 

## 2015-06-03 NOTE — Progress Notes (Signed)
[name], [name] ([id number]) Visit Report for 06/02/2015 Arrival Information Details Patient Name: [name], [name] Date of Service: 06/02/2015 11:30 AM Medical Record Number: [id number] Patient Account Number: [id number] Date of Birth/Sex: [birth year] (81 y.o. Female) Treating RN: Curtis Sites Primary Care Physician: PATIENT, NO Other Clinician: Referring Physician: Marisue Ivan Treating Physician/Extender: Rudene Re in Treatment: 13 Visit Information History Since Last Visit Added or deleted any medications: No Patient Arrived: Wheel Chair Any new allergies or adverse reactions: No Arrival Time: 11:40 Had a fall or experienced change in No activities of daily living that may affect Accompanied By: Dewayne Hatch risk of falls: Transfer Assistance: Manual Signs or symptoms of abuse/neglect since last No Patient Identification Verified: Yes visito Secondary Verification Process Yes Hospitalized since last visit: No Completed: Pain Present Now: No Patient Requires Transmission-Based No Precautions: Patient Has Alerts: Yes Electronic Signature(s) Signed: 06/02/2015 5:11:41 PM By: Ardath Sax MD Entered By: Ardath Sax on 06/02/2015 15:52:07 Yu, [name] ([id number]) -------------------------------------------------------------------------------- Clinic Level of Care Assessment Details Patient Name: [name] Date of Service: 06/02/2015 11:30 AM Medical Record Number: [id number] Patient Account Number: [id number] Date of Birth/Sex: 09/03/1912 (81 y.o. Female) Treating RN: Curtis Sites Primary Care Physician: PATIENT, NO Other Clinician: Referring Physician: Marisue Ivan Treating Physician/Extender: Elayne Snare in Treatment: 13 Clinic Level of Care Assessment Items TOOL 4 Quantity Score  - Use when only an EandM is performed on FOLLOW-UP visit 0 ASSESSMENTS - Nursing Assessment / Reassessment X - Reassessment of Co-morbidities (includes  updates in patient status) 1 10 X - Reassessment of Adherence to Treatment Plan 1 5 ASSESSMENTS - Wound and Skin Assessment / Reassessment  - Simple Wound Assessment / Reassessment - one wound 0 X - Complex Wound Assessment / Reassessment - multiple wounds 2 5  - Dermatologic / Skin Assessment (not related to wound area) 0 ASSESSMENTS - Focused Assessment X - Circumferential Edema Measurements - multi extremities 2 5  - Nutritional Assessment / Counseling / Intervention 0 X - Lower Extremity Assessment (monofilament, tuning fork, pulses) 1 5  - Peripheral Arterial Disease Assessment (using hand held doppler) 0 ASSESSMENTS - Ostomy and/or Continence Assessment and Care  - Incontinence Assessment and Management 0  - Ostomy Care Assessment and Management (repouching, etc.) 0 PROCESS - Coordination of Care X - Simple Patient / Family Education for ongoing care 1 15  - Complex (extensive) Patient / Family Education for ongoing care 0  - Staff obtains Chiropractor, Records, Test Results / Process Orders 0  - Staff telephones HHA, Nursing Homes / Clarify orders / etc 0  - Routine Transfer to another Facility (non-emergent condition) 0 Yu, Leah L. ([id number])  - Routine Hospital Admission (non-emergent condition) 0  - New Admissions / Manufacturing engineer / Ordering NPWT, Apligraf, etc. 0  - Emergency Hospital Admission (emergent condition) 0 X - Simple Discharge Coordination 1 10  - Complex (extensive) Discharge Coordination 0 PROCESS - Special Needs  - Pediatric / Minor Patient Management 0  - Isolation Patient Management 0  - Hearing / Language / Visual special needs 0  - Assessment of Community assistance (transportation, D/C planning, etc.) 0  - Additional assistance / Altered mentation 0  - Support Surface(s) Assessment (bed, cushion, seat, etc.) 0 INTERVENTIONS - Wound Cleansing / Measurement  - Simple Wound Cleansing - one wound 0 X -  Complex Wound Cleansing - multiple wounds 2 5 X - Wound Imaging (photographs - any number of wounds) 1 5  - Wound Tracing (instead of  photographs) 0 []  - Simple Wound Measurement - one wound 0 X - Complex Wound Measurement - multiple wounds 2 5 INTERVENTIONS - Wound Dressings []  - Small Wound Dressing one or multiple wounds 0 X - Medium Wound Dressing one or multiple wounds 2 15 []  - Large Wound Dressing one or multiple wounds 0 []  - Application of Medications - topical 0 []  - Application of Medications - injection 0 INTERVENTIONS - Miscellaneous []  - External ear exam 0 Yu, Leah L. ([id number]) []  - Specimen Collection (cultures, biopsies, blood, body fluids, etc.) 0 []  - Specimen(s) / Culture(s) sent or taken to Lab for analysis 0 []  - Patient Transfer (multiple staff / Michiel Sites Lift / Similar devices) 0 []  - Simple Staple / Suture removal (25 or less) 0 []  - Complex Staple / Suture removal (26 or more) 0 []  - Hypo / Hyperglycemic Management (close monitor of Blood Glucose) 0 []  - Ankle / Brachial Index (ABI) - do not check if billed separately 0 X - Vital Signs 1 5 Has the patient been seen at the hospital within the last three years: Yes Total Score: 125 Level Of Care: New/Established - Level 4 Electronic Signature(s) Signed: 06/02/2015 4:56:28 PM By: Curtis Sites Entered By: Curtis Sites on 06/02/2015 12:17:57 Yu, [name] ([id number]) -------------------------------------------------------------------------------- Encounter Discharge Information Details Patient Name: [name] Date of Service: 06/02/2015 11:30 AM Medical Record Number: [id number] Patient Account Number: [id number] Date of Birth/Sex: 03-10-1913 (81 y.o. Female) Treating RN: Curtis Sites Primary Care Physician: PATIENT, NO Other Clinician: Referring Physician: Marisue Ivan Treating Physician/Extender: Elayne Snare in Treatment: 13 Encounter Discharge Information  Items Discharge Pain Level: 0 Discharge Condition: Stable Ambulatory Status: Wheelchair Discharge Destination: Home Transportation: Private Auto Accompanied By: Dewayne Hatch Schedule Follow-up Appointment: Yes Medication Reconciliation completed and provided to Patient/Care No Janmarie Smoot: Provided on Clinical Summary of Care: 06/02/2015 Form Type Recipient Paper Patient NP Electronic Signature(s) Signed: 06/02/2015 5:11:41 PM By: Ardath Sax MD Previous Signature: 06/02/2015 12:33:11 PM Version By: Curtis Sites Previous Signature: 06/02/2015 12:16:31 PM Version By: Gwenlyn Perking Entered By: Ardath Sax on 06/02/2015 16:02:31 Giannone, [name] ([id number]) -------------------------------------------------------------------------------- Lower Extremity Assessment Details Patient Name: [name] Date of Service: 06/02/2015 11:30 AM Medical Record Number: [id number] Patient Account Number: [id number] Date of Birth/Sex: 13-Sep-1912 (81 y.o. Female) Treating RN: Curtis Sites Primary Care Physician: PATIENT, NO Other Clinician: Referring Physician: Marisue Ivan Treating Physician/Extender: Rudene Re in Treatment: 13 Edema Assessment Assessed: [Left: No] [Right: No] Edema: [Left: Yes] [Right: Yes] Calf Left: Right: Point of Measurement: 35 cm From Medial Instep 35 cm 35.3 cm Ankle Left: Right: Point of Measurement: 8 cm From Medial Instep 26 cm 26.4 cm Vascular Assessment Pulses: Posterior Tibial Dorsalis Pedis Palpable: [Left:Yes] [Right:Yes] Extremity colors, hair growth, and conditions: Extremity Color: [Left:Mottled] [Right:Mottled] Hair Growth on Extremity: [Left:No] [Right:No] Temperature of Extremity: [Left:Warm] [Right:Warm] Capillary Refill: [Left:< 3 seconds] [Right:< 3 seconds] Electronic Signature(s) Signed: 06/02/2015 4:56:28 PM By: Curtis Sites Entered By: Curtis Sites on 06/02/2015 11:53:27 Habel, [name]  ([id number]) -------------------------------------------------------------------------------- Multi Wound Chart Details Patient Name: [name] Date of Service: 06/02/2015 11:30 AM Medical Record Number: [id number] Patient Account Number: [id number] Date of Birth/Sex: Sep 19, 1912 (81 y.o. Female) Treating RN: Curtis Sites Primary Care Physician: PATIENT, NO Other Clinician: Referring Physician: Marisue Ivan Treating Physician/Extender: Rudene Re in Treatment: 13 Vital Signs Height(in): 64 Pulse(bpm): 61 Weight(lbs): 147 Blood Pressure 109/50 (mmHg): Body Mass Index(BMI): 25 Temperature(F): 97.8 Respiratory Rate 16 (breaths/min): Photos: [1:No Photos] [3:No  Photos] [N/A:N/A] Wound Location: [1:Left Lower Leg - Anterior Right Lower Leg - Midline, N/A] [3:Anterior] Wounding Event: [1:Trauma] [3:Blister] [N/A:N/A] Primary Etiology: [1:Skin Tear] [3:Lymphedema] [N/A:N/A] Comorbid History: [1:Hypertension, Osteoarthritis] [3:Hypertension, Osteoarthritis] [N/A:N/A] Date Acquired: [1:01/31/2015] [3:03/14/2015] [N/A:N/A] Weeks of Treatment: [1:13] [3:11] [N/A:N/A] Wound Status: [1:Open] [3:Open] [N/A:N/A] Measurements L x W x D 5.5x2.4x0.2 [3:2.5x1.1x0.2] [N/A:N/A] (cm) Area (cm) : [1:10.367] [3:2.16] [N/A:N/A] Volume (cm) : [1:2.073] [3:0.432] [N/A:N/A] % Reduction in Area: [1:71.10%] [3:54.20%] [N/A:N/A] % Reduction in Volume: 80.70% [3:8.30%] [N/A:N/A] Classification: [1:Full Thickness Without Exposed Support Structures] [3:Partial Thickness] [N/A:N/A] Exudate Amount: [1:Medium] [3:Small] [N/A:N/A] Exudate Type: [1:Serosanguineous] [3:Serosanguineous] [N/A:N/A] Exudate Color: [1:red, brown] [3:red, brown] [N/A:N/A] Wound Margin: [1:Distinct, outline attached Indistinct, nonvisible] [N/A:N/A] Granulation Amount: [1:Large (67-100%)] [3:Large (67-100%)] [N/A:N/A] Granulation Quality: [1:Pink, Pale] [3:Pink, Pale] [N/A:N/A] Necrotic Amount: [1:Small (1-33%)]  [3:Small (1-33%)] [N/A:N/A] Necrotic Tissue: [1:Eschar, Adherent Slough Adherent Slough] [N/A:N/A] Exposed Structures: [1:Fascia: No Fat: No Tendon: No] [3:Fascia: No Fat: No Tendon: No] [N/A:N/A] Muscle: No Muscle: No Joint: No Joint: No Bone: No Bone: No Limited to Skin Limited to Skin Breakdown Breakdown Epithelialization: Small (1-33%) Large (67-100%) N/A Periwound Skin Texture: Edema: Yes Edema: Yes N/A Excoriation: No Excoriation: No Induration: No Induration: No Callus: No Callus: No Crepitus: No Crepitus: No Fluctuance: No Fluctuance: No Friable: No Friable: No Rash: No Rash: No Scarring: No Scarring: No Periwound Skin Moist: Yes Moist: Yes N/A Moisture: Maceration: No Maceration: No Dry/Scaly: No Dry/Scaly: No Periwound Skin Color: Atrophie Blanche: No Atrophie Blanche: No N/A Cyanosis: No Cyanosis: No Ecchymosis: No Ecchymosis: No Erythema: No Erythema: No Hemosiderin Staining: No Hemosiderin Staining: No Mottled: No Mottled: No Pallor: No Pallor: No Rubor: No Rubor: No Temperature: No Abnormality No Abnormality N/A Tenderness on Yes Yes N/A Palpation: Wound Preparation: Ulcer Cleansing: Ulcer Cleansing: N/A Rinsed/Irrigated with Rinsed/Irrigated with Saline Saline Topical Anesthetic Topical Anesthetic Applied: None Applied: None Treatment Notes Electronic Signature(s) Signed: 06/02/2015 4:56:28 PM By: Curtis Sites Entered By: Curtis Sites on 06/02/2015 11:55:50 Meng, [name] ([id number]) -------------------------------------------------------------------------------- Multi-Disciplinary Care Plan Details Patient Name: [name] Date of Service: 06/02/2015 11:30 AM Medical Record Number: [id number] Patient Account Number: [id number] Date of Birth/Sex: Oct 27, 1912 (81 y.o. Female) Treating RN: Curtis Sites Primary Care Physician: PATIENT, NO Other Clinician: Referring Physician: Marisue Ivan Treating  Physician/Extender: Rudene Re in Treatment: 26 Active Inactive Abuse / Safety / Falls / Self Care Management Nursing Diagnoses: Impaired home maintenance Impaired physical mobility Potential for falls Self care deficit: actual or potential Goals: Patient will remain injury free Date Initiated: 03/03/2015 Goal Status: Active Patient/caregiver will verbalize understanding of skin care regimen Date Initiated: 03/03/2015 Goal Status: Active Patient/caregiver will verbalize/demonstrate measure taken to improve self care Date Initiated: 03/03/2015 Goal Status: Active Patient/caregiver will verbalize/demonstrate measures taken to improve the patient's personal safety Date Initiated: 03/03/2015 Goal Status: Active Patient/caregiver will verbalize/demonstrate measures taken to prevent injury and/or falls Date Initiated: 03/03/2015 Goal Status: Active Patient/caregiver will verbalize/demonstrate understanding of what to do in case of emergency Date Initiated: 03/03/2015 Goal Status: Active Interventions: Assess: immobility, friction, shearing, incontinence upon admission and as needed Assess impairment of mobility on admission and as needed per policy Provide education on basic hygiene Provide education on personal and home safety Provide education on safe transfers COPELYN, WIDMER ([id number]) Treatment Activities: Education provided on Basic Hygiene : 03/10/2015 Notes: Orientation to the Wound Care Program Nursing Diagnoses: Knowledge deficit related to the wound healing center program Goals: Patient/caregiver will verbalize understanding of the Wound Healing Center Program Date  Initiated: 03/03/2015 Goal Status: Active Interventions: Provide education on orientation to the wound center Notes: Venous Leg Ulcer Nursing Diagnoses: Knowledge deficit related to disease process and management Potential for venous Insuffiency (use before diagnosis confirmed) Goals: Patient will  maintain optimal edema control Date Initiated: 03/03/2015 Goal Status: Active Patient/caregiver will verbalize understanding of disease process and disease management Date Initiated: 03/03/2015 Goal Status: Active Verify adequate tissue perfusion prior to therapeutic compression application Date Initiated: 03/03/2015 Goal Status: Active Interventions: Assess peripheral edema status every visit. Compression as ordered Provide education on venous insufficiency Treatment Activities: Test ordered outside of clinic : 06/02/2015 Notes: KELBI, RENSTROM ([id number]) Wound/Skin Impairment Nursing Diagnoses: Impaired tissue integrity Knowledge deficit related to ulceration/compromised skin integrity Goals: Patient/caregiver will verbalize understanding of skin care regimen Date Initiated: 03/03/2015 Goal Status: Active Ulcer/skin breakdown will have a volume reduction of 30% by week 4 Date Initiated: 03/03/2015 Goal Status: Active Ulcer/skin breakdown will have a volume reduction of 50% by week 8 Date Initiated: 03/03/2015 Goal Status: Active Ulcer/skin breakdown will have a volume reduction of 80% by week 12 Date Initiated: 03/03/2015 Goal Status: Active Ulcer/skin breakdown will heal within 14 weeks Date Initiated: 03/03/2015 Goal Status: Active Interventions: Assess patient/caregiver ability to perform ulcer/skin care regimen upon admission and as needed Assess ulceration(s) every visit Provide education on ulcer and skin care Notes: Electronic Signature(s) Signed: 06/02/2015 4:56:28 PM By: Curtis Sites Entered By: Curtis Sites on 06/02/2015 11:55:43 Toney, [name] ([id number]) -------------------------------------------------------------------------------- Patient/Caregiver Education Details Patient Name: [name] Date of Service: 06/02/2015 11:30 AM Medical Record Number: [id number] Patient Account Number: [id number] Date of Birth/Gender: 1913-06-29 (81 y.o. Female) Treating  RN: Curtis Sites Primary Care Physician: PATIENT, NO Other Clinician: Referring Physician: Marisue Ivan Treating Physician/Extender: Elayne Snare in Treatment: 13 Education Assessment Education Provided To: Patient Education Topics Provided Venous: Handouts: Other: elevate legs for edema management Methods: Explain/Verbal Responses: State content correctly Electronic Signature(s) Signed: 06/02/2015 5:11:41 PM By: Ardath Sax MD Previous Signature: 06/02/2015 12:33:33 PM Version By: Curtis Sites Entered By: Ardath Sax on 06/02/2015 16:02:40 Metzger, [name] ([id number]) -------------------------------------------------------------------------------- Wound Assessment Details Patient Name: [name] Date of Service: 06/02/2015 11:30 AM Medical Record Number: [id number] Patient Account Number: [id number] Date of Birth/Sex: 1913-06-10 (81 y.o. Female) Treating RN: Curtis Sites Primary Care Physician: PATIENT, NO Other Clinician: Referring Physician: Marisue Ivan Treating Physician/Extender: Rudene Re in Treatment: 13 Wound Status Wound Number: 1 Primary Etiology: Skin Tear Wound Location: Left Lower Leg - Anterior Wound Status: Open Wounding Event: Trauma Comorbid History: Hypertension, Osteoarthritis Date Acquired: 01/31/2015 Weeks Of Treatment: 13 Clustered Wound: No Photos Photo Uploaded By: Curtis Sites on 06/02/2015 15:31:46 Wound Measurements Length: (cm) 5.5 % Reduction in Width: (cm) 2.4 % Reduction in Depth: (cm) 0.2 Epithelializati Area: (cm) 10.367 Tunneling: Volume: (cm) 2.073 Undermining: Area: 71.1% Volume: 80.7% on: Small (1-33%) No No Wound Description Full Thickness Without Exposed Classification: Support Structures Wound Margin: Distinct, outline attached Exudate Medium Amount: Exudate Type: Serosanguineous Exudate Color: red, brown Foul Odor After Cleansing: No Wound Bed Granulation  Amount: Large (67-100%) Exposed Structure Granulation Quality: Pink, Pale Fascia Exposed: No Necrotic Amount: Small (1-33%) Fat Layer Exposed: No Wauters, Vashti L. ([id number]) Necrotic Quality: Eschar, Adherent Slough Tendon Exposed: No Muscle Exposed: No Joint Exposed: No Bone Exposed: No Limited to Skin Breakdown Periwound Skin Texture Texture Color No Abnormalities Noted: No No Abnormalities Noted: No Callus: No Atrophie Blanche: No Crepitus: No Cyanosis: No Excoriation: No Ecchymosis: No Fluctuance: No Erythema: No  Friable: No Hemosiderin Staining: No Induration: No Mottled: No Localized Edema: Yes Pallor: No Rash: No Rubor: No Scarring: No Temperature / Pain Moisture Temperature: No Abnormality No Abnormalities Noted: No Tenderness on Palpation: Yes Dry / Scaly: No Maceration: No Moist: Yes Wound Preparation Ulcer Cleansing: Rinsed/Irrigated with Saline Topical Anesthetic Applied: None Treatment Notes Wound #1 (Left, Anterior Lower Leg) 1. Cleansed with: Clean wound with Normal Saline 4. Dressing Applied: Hydrafera Blue 5. Secondary Dressing Applied Gauze and Kerlix/Conform 6. Footwear/Offloading device applied Ace wrap 7. Secured with Secretary/administrator) Signed: 06/02/2015 4:56:28 PM By: Curtis Sites Entered By: Curtis Sites on 06/02/2015 11:55:19 Eanes, [name] ([id number]) -------------------------------------------------------------------------------- Wound Assessment Details Patient Name: [name] Date of Service: 06/02/2015 11:30 AM Medical Record Number: [id number] Patient Account Number: [id number] Date of Birth/Sex: 06-Nov-1912 (81 y.o. Female) Treating RN: Curtis Sites Primary Care Physician: PATIENT, NO Other Clinician: Referring Physician: Marisue Ivan Treating Physician/Extender: Rudene Re in Treatment: 13 Wound Status Wound Number: 3 Primary Etiology: Lymphedema Wound Location: Right Lower  Leg - Midline, Wound Status: Open Anterior Comorbid History: Hypertension, Osteoarthritis Wounding Event: Blister Date Acquired: 03/14/2015 Weeks Of Treatment: 11 Clustered Wound: No Photos Photo Uploaded By: Curtis Sites on 06/02/2015 15:31:47 Wound Measurements Length: (cm) 2.5 % Reduction in Width: (cm) 1.1 % Reduction in Depth: (cm) 0.2 Epithelializati Area: (cm) 2.16 Tunneling: Volume: (cm) 0.432 Undermining: Area: 54.2% Volume: 8.3% on: Large (67-100%) No No Wound Description Classification: Partial Thickness Wound Margin: Indistinct, nonvisible Exudate Amount: Small Exudate Type: Serosanguineous Exudate Color: red, brown Foul Odor After Cleansing: No Wound Bed Granulation Amount: Large (67-100%) Exposed Structure Granulation Quality: Pink, Pale Fascia Exposed: No Necrotic Amount: Small (1-33%) Fat Layer Exposed: No Kramar, Shalona L. ([id number]) Necrotic Quality: Adherent Slough Tendon Exposed: No Muscle Exposed: No Joint Exposed: No Bone Exposed: No Limited to Skin Breakdown Periwound Skin Texture Texture Color No Abnormalities Noted: No No Abnormalities Noted: No Callus: No Atrophie Blanche: No Crepitus: No Cyanosis: No Excoriation: No Ecchymosis: No Fluctuance: No Erythema: No Friable: No Hemosiderin Staining: No Induration: No Mottled: No Localized Edema: Yes Pallor: No Rash: No Rubor: No Scarring: No Temperature / Pain Moisture Temperature: No Abnormality No Abnormalities Noted: No Tenderness on Palpation: Yes Dry / Scaly: No Maceration: No Moist: Yes Wound Preparation Ulcer Cleansing: Rinsed/Irrigated with Saline Topical Anesthetic Applied: None Treatment Notes Wound #3 (Right, Midline, Anterior Lower Leg) 1. Cleansed with: Clean wound with Normal Saline 4. Dressing Applied: Hydrafera Blue 5. Secondary Dressing Applied Gauze and Kerlix/Conform 6. Footwear/Offloading device applied Ace wrap 7. Secured  with Secretary/administrator) Signed: 06/02/2015 4:56:28 PM By: Curtis Sites Entered By: Curtis Sites on 06/02/2015 11:55:36 Terhune, [name] ([id number]) -------------------------------------------------------------------------------- Vitals Details Patient Name: [name] Date of Service: 06/02/2015 11:30 AM Medical Record Number: [id number] Patient Account Number: [id number] Date of Birth/Sex: 08/25/1913 (81 y.o. Female) Treating RN: Curtis Sites Primary Care Physician: PATIENT, NO Other Clinician: Referring Physician: Marisue Ivan Treating Physician/Extender: Rudene Re in Treatment: 13 Vital Signs Time Taken: 11:41 Temperature (F): 97.8 Height (in): 64 Pulse (bpm): 61 Weight (lbs): 147 Respiratory Rate (breaths/min): 16 Body Mass Index (BMI): 25.2 Blood Pressure (mmHg): 109/50 Reference Range: 80 - 120 mg / dl Electronic Signature(s) Signed: 06/02/2015 4:56:28 PM By: Curtis Sites Entered By: Curtis Sites on 06/02/2015 11:41:37

## 2015-06-07 NOTE — Progress Notes (Addendum)
MERISA, JULIO ([id number]) Visit Report for 06/02/2015 Chief Complaint Document Details Patient Name: [name], [name] 06/02/2015 11:30 Date of Service: AM Medical Record [id number] Number: Patient Account Number: [id number] 1913-03-12 (81 y.o. Treating RN: Curtis Sites Date of Birth/Sex: Female) Other Clinician: Primary Care Physician: PATIENT, NO Treating Jaecob Lowden Referring Physician: Marisue Ivan Physician/Extender: Weeks in Treatment: 13 Information Obtained from: Patient Chief Complaint Patient seen for complaints of Non-Healing Wound. pleasant 81 year old patient who had a fall and injured both lower extremities on 01/31/2015 Electronic Signature(s) Signed: 06/02/2015 5:11:41 PM By: Ardath Sax MD Entered By: Ardath Sax on 06/02/2015 15:54:30 Aldaco, [name] ([id number]) -------------------------------------------------------------------------------- HPI Details Patient Name: [name], [name] 06/02/2015 11:30 Date of Service: AM Medical Record [id number] Number: Patient Account Number: [id number] [birth year] (81 y.o. Treating RN: Curtis Sites Date of Birth/Sex: Female) Other Clinician: Primary Care Physician: PATIENT, NO Treating Corrinna Karapetyan Referring Physician: Marisue Ivan Physician/Extender: Weeks in Treatment: 13 History of Present Illness Location: both lower extremities Quality: Patient reports experiencing a dull pain to affected area(s). Severity: Patient states wound (s) are getting better. Duration: Patient has had the wound for < 4 weeks prior to presenting for treatment Timing: Pain in wound is Intermittent (comes and goes Context: The wound occurred when the patient had a fall and lacerated both lower extremities. Modifying Factors: Other treatment(s) tried include:she had sutures applied there for a while and when they were removed the wound opened up. Associated Signs and Symptoms: Patient reports having difficulty standing  for long periods. HPI Description: 81 year old female who fell and had an injury to her right forehead, right elbow and both shins on 01/31/2015. She went to the ER at [facility] and had been treated appropriately with sutures being placed to her right and left lower extremity. Also had a CT scan which showed no intracranial hemorrhage and only a large scalp hematoma on the right frontal region.she was put on Keflex for 5 days. she had also received Bactrim for 14 days.Since then she has been seen by her PCP and he had put her on Augmentin for 14 days and wound culture was obtained. culture reports were reviewed -- she grew an MRSA sensitive to tetracycline, Bactrim and vancomycin. Her past medical history significant for osteoporosis, hypertension, spinal stenosis, right hip replacement, appendectomy and abdomen hysterectomy for ovarian cancer. She has been seen in the past by the vascular surgery group at [facility] and she has an appointment to see them again. She is known to use lymphedema pumps while she has been in her assisted living home but has not been using them for the last month. She does also have compression stockings which she uses intermittently. 03/17/2015 -- She was seen by Dr. Gilda Crease on 03/03/2015 -- he reviewed the patientos case and recommended no surgical intervention. He has recommended compression stockings of the 20-30 mmHg variety and also recommended lymphedema pumps. He will see her back in 3 months. The patient has developed some blisters on both lower extremities and this may be due to the fact that she's had a Kerlix gauze wrap on both lower extremities and is also using the lymphedema pumps. 05/09/2015 -- she has a new wound on the dorsum of the right foot just proximal to the toes but this was probably a blister which opened out and is very superficial. Electronic Signature(s) Signed: 06/02/2015 5:11:41 PM By: Ardath Sax MD Entered By: Ardath Sax on 06/02/2015 15:54:42 Ragsdale, [name] ([id number]) Gesner, [name] ([id number]) -------------------------------------------------------------------------------- Physical  Exam Details Patient Name: [name], [name] 06/02/2015 11:30 Date of Service: AM Medical Record [id number] Number: Patient Account Number: [id number] 01/20/1913 (81 y.o. Treating RN: Curtis Sites Date of Birth/Sex: Female) Other Clinician: Primary Care Physician: PATIENT, NO Treating Freyja Govea Referring Physician: Marisue Ivan Physician/Extender: Weeks in Treatment: 13 Electronic Signature(s) Signed: 06/02/2015 5:11:41 PM By: Ardath Sax MD Entered By: Ardath Sax on 06/02/2015 15:57:52 Ehlert, [name] ([id number]) -------------------------------------------------------------------------------- Physician Orders Details Patient Name: [name], [name] 06/02/2015 11:30 Date of Service: AM Medical Record [id number] Number: Patient Account Number: [id number] October 23, 1912 (81 y.o. Treating RN: Curtis Sites Date of Birth/Sex: Female) Other Clinician: Primary Care Physician: PATIENT, NO Treating Sapphire Tygart Referring Physician: Marisue Ivan Physician/Extender: Weeks in Treatment: 13 Verbal / Phone Orders: Yes Clinician: Curtis Sites Read Back and Verified: Yes Diagnosis Coding Wound Cleansing Wound #1 Left,Anterior Lower Leg o Clean wound with Normal Saline. Wound #3 Right,Midline,Anterior Lower Leg o Clean wound with Normal Saline. Skin Barriers/Peri-Wound Care Wound #1 Left,Anterior Lower Leg o Moisturizing lotion Wound #3 Right,Midline,Anterior Lower Leg o Moisturizing lotion Primary Wound Dressing Wound #1 Left,Anterior Lower Leg o Hydrafera Blue Wound #3 Right,Midline,Anterior Lower Leg o Hydrafera Blue Secondary Dressing Wound #1 Left,Anterior Lower Leg o Gauze and Kerlix/Conform Wound #3 Right,Midline,Anterior Lower Leg o Gauze and  Kerlix/Conform Dressing Change Frequency Wound #1 Left,Anterior Lower Leg o Change dressing every other day. Wound #3 Right,Midline,Anterior Lower Leg o Change dressing every other day. [name], [name] ([id number]) Follow-up Appointments Wound #1 Left,Anterior Lower Leg o Return Appointment in 1 week. Wound #3 Right,Midline,Anterior Lower Leg o Return Appointment in 1 week. Edema Control Wound #1 Left,Anterior Lower Leg o Other: - ace wrap - on in the morning and off at bedtime Wound #3 Right,Midline,Anterior Lower Leg o Other: - ace wrap - on in the morning and off at bedtime Home Health Wound #1 Left,Anterior Lower Leg o Continue Home Health Visits - [facility] o Home Health Nurse may visit PRN to address patientos wound care needs. o FACE TO FACE ENCOUNTER: MEDICARE and MEDICAID PATIENTS: I certify that this patient is under my care and that I had a face-to-face encounter that meets the physician face-to-face encounter requirements with this patient on this date. The encounter with the patient was in whole or in part for the following MEDICAL CONDITION: (primary reason for Home Healthcare) MEDICAL NECESSITY: I certify, that based on my findings, NURSING services are a medically necessary home health service. HOME BOUND STATUS: I certify that my clinical findings support that this patient is homebound (i.e., Due to illness or injury, pt requires aid of supportive devices such as crutches, cane, wheelchairs, walkers, the use of special transportation or the assistance of another person to leave their place of residence. There is a normal inability to leave the home and doing so requires considerable and taxing effort. Other absences are for medical reasons / religious services and are infrequent or of short duration when for other reasons). o If current dressing causes regression in wound condition, may D/C ordered dressing product/s and apply Normal Saline Moist  Dressing daily until next Wound Healing Center / Other MD appointment. Notify Wound Healing Center of regression in wound condition at [phone]. o Please direct any NON-WOUND related issues/requests for orders to patient's Primary Care Physician Wound #3 Right,Midline,Anterior Lower Leg o Continue Home Health Visits - [facility] o Home Health Nurse may visit PRN to address patientos wound care needs. o FACE TO FACE ENCOUNTER: MEDICARE and MEDICAID PATIENTS: I certify that  this patient is under my care and that I had a face-to-face encounter that meets the physician face-to-face encounter requirements with this patient on this date. The encounter with the patient was in whole or in part for the following MEDICAL CONDITION: (primary reason for Home Healthcare) MEDICAL NECESSITY: I certify, that based on my findings, NURSING services are a medically necessary home health service. HOME BOUND STATUS: I certify that my clinical findings support that this patient is homebound (i.e., Due to illness or injury, pt requires aid of supportive devices such as crutches, cane, wheelchairs, walkers, the use of special transportation or the assistance of another person to leave their place of residence. There is a [name], Leah L. ([id number]) normal inability to leave the home and doing so requires considerable and taxing effort. Other absences are for medical reasons / religious services and are infrequent or of short duration when for other reasons). o If current dressing causes regression in wound condition, may D/C ordered dressing product/s and apply Normal Saline Moist Dressing daily until next Wound Healing Center / Other MD appointment. Notify Wound Healing Center of regression in wound condition at [phone]. o Please direct any NON-WOUND related issues/requests for orders to patient's Primary Care Physician Electronic Signature(s) Signed: 06/07/2015 9:14:54 AM By: Ardath Sax  MD Previous Signature: 06/02/2015 4:56:28 PM Version By: Curtis Sites Previous Signature: 06/07/2015 8:06:26 AM Version By: Evlyn Kanner MD, FACS Entered By: Ardath Sax on 06/07/2015 09:14:54 [name], [name] ([id number]) -------------------------------------------------------------------------------- Problem List Details Patient Name: AKAYA, PROFFIT 06/02/2015 11:30 Date of Service: AM Medical Record [id number] Number: Patient Account Number: [id number] 04-Feb-1913 (81 y.o. Treating RN: Curtis Sites Date of Birth/Sex: Female) Other Clinician: Primary Care Physician: PATIENT, NO Treating Jimmey Ralph, Iva Montelongo Referring Physician: Marisue Ivan Physician/Extender: Weeks in Treatment: 13 Active Problems ICD-10 Encounter Code Description Active Date Diagnosis L97.212 Non-pressure chronic ulcer of right calf with fat layer 03/03/2015 Yes exposed L97.222 Non-pressure chronic ulcer of left calf with fat layer 03/03/2015 Yes exposed I89.0 Lymphedema, not elsewhere classified 03/03/2015 Yes L97.511 Non-pressure chronic ulcer of other part of right foot 04/29/2015 Yes limited to breakdown of skin Inactive Problems Resolved Problems Electronic Signature(s) Signed: 06/02/2015 5:11:41 PM By: Ardath Sax MD Entered By: Ardath Sax on 06/02/2015 15:54:06 Casa, [name] ([id number]) -------------------------------------------------------------------------------- Progress Note Details Patient Name: [name], [name] 06/02/2015 11:30 Date of Service: AM Medical Record [id number] Number: Patient Account Number: [id number] 1912/09/01 (81 y.o. Treating RN: Curtis Sites Date of Birth/Sex: Female) Other Clinician: Primary Care Physician: PATIENT, NO Treating Perrie Ragin Referring Physician: Marisue Ivan Physician/Extender: Weeks in Treatment: 13 Subjective Chief Complaint Information obtained from Patient Patient seen for complaints of Non-Healing Wound. pleasant 80 year old  patient who had a fall and injured both lower extremities on 01/31/2015 History of Present Illness (HPI) The following HPI elements were documented for the patient's wound: Location: both lower extremities Quality: Patient reports experiencing a dull pain to affected area(s). Severity: Patient states wound (s) are getting better. Duration: Patient has had the wound for < 4 weeks prior to presenting for treatment Timing: Pain in wound is Intermittent (comes and goes Context: The wound occurred when the patient had a fall and lacerated both lower extremities. Modifying Factors: Other treatment(s) tried include:she had sutures applied there for a while and when they were removed the wound opened up. Associated Signs and Symptoms: Patient reports having difficulty standing for long periods. 81 year old female who fell and had an injury to her right forehead, right elbow and both shins on  01/31/2015. She went to the ER at [facility] and had been treated appropriately with sutures being placed to her right and left lower extremity. Also had a CT scan which showed no intracranial hemorrhage and only a large scalp hematoma on the right frontal region.she was put on Keflex for 5 days. she had also received Bactrim for 14 days.Since then she has been seen by her PCP and he had put her on Augmentin for 14 days and wound culture was obtained. culture reports were reviewed -- she grew an MRSA sensitive to tetracycline, Bactrim and vancomycin. Her past medical history significant for osteoporosis, hypertension, spinal stenosis, right hip replacement, appendectomy and abdomen hysterectomy for ovarian cancer. She has been seen in the past by the vascular surgery group at [facility] and she has an appointment to see them again. She is known to use lymphedema pumps while she has been in her assisted living home but has not been using them for the last month. She does also have  compression stockings which she uses intermittently. 03/17/2015 -- She was seen by Dr. Gilda Crease on 03/03/2015 -- he reviewed the patient s case and recommended no surgical intervention. He has recommended compression stockings of the 20-30 mmHg variety and also recommended lymphedema pumps. He will see her back in 3 months. The patient has developed some blisters on both lower extremities and this may be due to the fact that Satz, Ayden L. ([id number]) she's had a Kerlix gauze wrap on both lower extremities and is also using the lymphedema pumps. 05/09/2015 -- she has a new wound on the dorsum of the right foot just proximal to the toes but this was probably a blister which opened out and is very superficial. Objective Constitutional Vitals Time Taken: 11:41 AM, Height: 64 in, Weight: 147 lbs, BMI: 25.2, Temperature: 97.8 F, Pulse: 61 bpm, Respiratory Rate: 16 breaths/min, Blood Pressure: 109/50 mmHg. Integumentary (Hair, Skin) Wound #1 status is Open. Original cause of wound was Trauma. The wound is located on the Left,Anterior Lower Leg. The wound measures 5.5cm length x 2.4cm width x 0.2cm depth; 10.367cm^2 area and 2.073cm^3 volume. The wound is limited to skin breakdown. There is no tunneling or undermining noted. There is a medium amount of serosanguineous drainage noted. The wound margin is distinct with the outline attached to the wound base. There is large (67-100%) pink, pale granulation within the wound bed. There is a small (1-33%) amount of necrotic tissue within the wound bed including Eschar and Adherent Slough. The periwound skin appearance exhibited: Localized Edema, Moist. The periwound skin appearance did not exhibit: Callus, Crepitus, Excoriation, Fluctuance, Friable, Induration, Rash, Scarring, Dry/Scaly, Maceration, Atrophie Blanche, Cyanosis, Ecchymosis, Hemosiderin Staining, Mottled, Pallor, Rubor, Erythema. Periwound temperature was noted as No Abnormality. The  periwound has tenderness on palpation. Wound #3 status is Open. Original cause of wound was Blister. The wound is located on the Right,Midline,Anterior Lower Leg. The wound measures 2.5cm length x 1.1cm width x 0.2cm depth; 2.16cm^2 area and 0.432cm^3 volume. The wound is limited to skin breakdown. There is no tunneling or undermining noted. There is a small amount of serosanguineous drainage noted. The wound margin is indistinct and nonvisible. There is large (67-100%) pink, pale granulation within the wound bed. There is a small (1-33%) amount of necrotic tissue within the wound bed including Adherent Slough. The periwound skin appearance exhibited: Localized Edema, Moist. The periwound skin appearance did not exhibit: Callus, Crepitus, Excoriation, Fluctuance, Friable, Induration, Rash, Scarring, Dry/Scaly, Maceration, Atrophie Blanche, Cyanosis,  Ecchymosis, Hemosiderin Staining, Mottled, Pallor, Rubor, Erythema. Periwound temperature was noted as No Abnormality. The periwound has tenderness on palpation. Assessment Active Problems ICD-10 L97.212 - Non-pressure chronic ulcer of right calf with fat layer exposed L97.222 - Non-pressure chronic ulcer of left calf with fat layer exposed Bruster, Aimie L. ([id number]) I89.0 - Lymphedema, not elsewhere classified L97.511 - Non-pressure chronic ulcer of other part of right foot limited to breakdown of skin Plan Wound Cleansing: Wound #1 Left,Anterior Lower Leg: Clean wound with Normal Saline. Wound #3 Right,Midline,Anterior Lower Leg: Clean wound with Normal Saline. Skin Barriers/Peri-Wound Care: Wound #1 Left,Anterior Lower Leg: Moisturizing lotion Wound #3 Right,Midline,Anterior Lower Leg: Moisturizing lotion Primary Wound Dressing: Wound #1 Left,Anterior Lower Leg: Hydrafera Blue Wound #3 Right,Midline,Anterior Lower Leg: Hydrafera Blue Secondary Dressing: Wound #1 Left,Anterior Lower Leg: Gauze and Kerlix/Conform Wound #3  Right,Midline,Anterior Lower Leg: Gauze and Kerlix/Conform Dressing Change Frequency: Wound #1 Left,Anterior Lower Leg: Change dressing every other day. Wound #3 Right,Midline,Anterior Lower Leg: Change dressing every other day. Follow-up Appointments: Wound #1 Left,Anterior Lower Leg: Return Appointment in 1 week. Wound #3 Right,Midline,Anterior Lower Leg: Return Appointment in 1 week. Edema Control: Wound #1 Left,Anterior Lower Leg: Other: - ace wrap - on in the morning and off at bedtime Wound #3 Right,Midline,Anterior Lower Leg: Other: - ace wrap - on in the morning and off at bedtime Home Health: Wound #1 Left,Anterior Lower Leg: Continue Home Health Visits - [facility] Nurse may visit PRN to address patient s wound care needs. FACE TO FACE ENCOUNTER: MEDICARE and MEDICAID PATIENTS: I certify that this patient is under [name], Leah L. ([id number]) my care and that I had a face-to-face encounter that meets the physician face-to-face encounter requirements with this patient on this date. The encounter with the patient was in whole or in part for the following MEDICAL CONDITION: (primary reason for Home Healthcare) MEDICAL NECESSITY: I certify, that based on my findings, NURSING services are a medically necessary home health service. HOME BOUND STATUS: I certify that my clinical findings support that this patient is homebound (i.e., Due to illness or injury, pt requires aid of supportive devices such as crutches, cane, wheelchairs, walkers, the use of special transportation or the assistance of another person to leave their place of residence. There is a normal inability to leave the home and doing so requires considerable and taxing effort. Other absences are for medical reasons / religious services and are infrequent or of short duration when for other reasons). If current dressing causes regression in wound condition, may D/C ordered dressing product/s and apply Normal  Saline Moist Dressing daily until next Wound Healing Center / Other MD appointment. Notify Wound Healing Center of regression in wound condition at [phone]. Please direct any NON-WOUND related issues/requests for orders to patient's Primary Care Physician Wound #3 Right,Midline,Anterior Lower Leg: Continue Home Health Visits - [facility] Nurse may visit PRN to address patient s wound care needs. FACE TO FACE ENCOUNTER: MEDICARE and MEDICAID PATIENTS: I certify that this patient is under my care and that I had a face-to-face encounter that meets the physician face-to-face encounter requirements with this patient on this date. The encounter with the patient was in whole or in part for the following MEDICAL CONDITION: (primary reason for Home Healthcare) MEDICAL NECESSITY: I certify, that based on my findings, NURSING services are a medically necessary home health service. HOME BOUND STATUS: I certify that my clinical findings support that this patient is homebound (i.e., Due to illness or  injury, pt requires aid of supportive devices such as crutches, cane, wheelchairs, walkers, the use of special transportation or the assistance of another person to leave their place of residence. There is a normal inability to leave the home and doing so requires considerable and taxing effort. Other absences are for medical reasons / religious services and are infrequent or of short duration when for other reasons). If current dressing causes regression in wound condition, may D/C ordered dressing product/s and apply Normal Saline Moist Dressing daily until next Wound Healing Center / Other MD appointment. Notify Wound Healing Center of regression in wound condition at [phone]. Please direct any NON-WOUND related issues/requests for orders to patient's Primary Care Physician Follow-Up Appointments: A follow-up appointment should be scheduled. A Patient Clinical Summary of Care was provided  to NP Trauma etiology with bilateral venous stasis.H84.696 and I83.012 Electronic Signature(s) Signed: 06/02/2015 5:11:41 PM By: Ardath Sax MD Entered By: Ardath Sax on 06/02/2015 16:01:06 [name], [name] ([id number]) [name], [name] ([id number]) -------------------------------------------------------------------------------- SuperBill Details Patient Name: [name] Date of Service: 06/02/2015 Medical Record Number: [id number] Patient Account Number: [id number] Date of Birth/Sex: 06-Sep-1912 (81 y.o. Female) Treating RN: Curtis Sites Primary Care Physician: PATIENT, NO Other Clinician: Referring Physician: Marisue Ivan Treating Physician/Extender: Elayne Snare in Treatment: 13 Diagnosis Coding ICD-10 Codes Code Description [phone] Non-pressure chronic ulcer of right calf with fat layer exposed L97.222 Non-pressure chronic ulcer of left calf with fat layer exposed I89.0 Lymphedema, not elsewhere classified L97.511 Non-pressure chronic ulcer of other part of right foot limited to breakdown of skin Facility Procedures CPT4 Code: [id number] Description: 99214 - WOUND CARE VISIT-LEV 4 EST PT Modifier: Quantity: 1 Physician Procedures CPT4 Code: [id number] Description: 16606 - WC PHYS LEVEL 2 - EST PT ICD-10 Description Diagnosis L97.212 Non-pressure chronic ulcer of right calf with fat L97.222 Non-pressure chronic ulcer of left calf with fat l Modifier: layer exposed ayer exposed Quantity: 1 Electronic Signature(s) Signed: 06/02/2015 5:11:41 PM By: Ardath Sax MD Entered By: Ardath Sax on 06/02/2015 16:02:05

## 2015-06-09 ENCOUNTER — Inpatient Hospital Stay
Admission: EM | Admit: 2015-06-09 | Discharge: 2015-06-17 | DRG: 393 | Disposition: A | Discharge status: Skilled Nursing Facility | Payer: Medicare Other | Attending: Internal Medicine | Admitting: Internal Medicine

## 2015-06-09 ENCOUNTER — Other Ambulatory Visit: Payer: Self-pay

## 2015-06-09 ENCOUNTER — Encounter: Payer: Medicare Other | Admitting: Surgery

## 2015-06-09 DIAGNOSIS — L97222 Non-pressure chronic ulcer of left calf with fat layer exposed: Secondary | ICD-10-CM | POA: Diagnosis not present

## 2015-06-09 DIAGNOSIS — R4182 Altered mental status, unspecified: Secondary | ICD-10-CM

## 2015-06-09 DIAGNOSIS — K648 Other hemorrhoids: Principal | ICD-10-CM | POA: Diagnosis present

## 2015-06-09 DIAGNOSIS — L97212 Non-pressure chronic ulcer of right calf with fat layer exposed: Secondary | ICD-10-CM | POA: Diagnosis present

## 2015-06-09 DIAGNOSIS — M1990 Unspecified osteoarthritis, unspecified site: Secondary | ICD-10-CM | POA: Diagnosis not present

## 2015-06-09 DIAGNOSIS — S81801A Unspecified open wound, right lower leg, initial encounter: Secondary | ICD-10-CM | POA: Diagnosis present

## 2015-06-09 DIAGNOSIS — Z66 Do not resuscitate: Secondary | ICD-10-CM | POA: Diagnosis present

## 2015-06-09 DIAGNOSIS — Z888 Allergy status to other drugs, medicaments and biological substances status: Secondary | ICD-10-CM | POA: Diagnosis not present

## 2015-06-09 DIAGNOSIS — Z79899 Other long term (current) drug therapy: Secondary | ICD-10-CM

## 2015-06-09 DIAGNOSIS — R451 Restlessness and agitation: Secondary | ICD-10-CM | POA: Diagnosis present

## 2015-06-09 DIAGNOSIS — N3000 Acute cystitis without hematuria: Secondary | ICD-10-CM | POA: Diagnosis not present

## 2015-06-09 DIAGNOSIS — N39490 Overflow incontinence: Secondary | ICD-10-CM | POA: Diagnosis not present

## 2015-06-09 DIAGNOSIS — K922 Gastrointestinal hemorrhage, unspecified: Secondary | ICD-10-CM

## 2015-06-09 DIAGNOSIS — E876 Hypokalemia: Secondary | ICD-10-CM | POA: Diagnosis not present

## 2015-06-09 DIAGNOSIS — F05 Delirium due to known physiological condition: Secondary | ICD-10-CM | POA: Diagnosis not present

## 2015-06-09 DIAGNOSIS — R05 Cough: Secondary | ICD-10-CM

## 2015-06-09 DIAGNOSIS — I890 Lymphedema, not elsewhere classified: Secondary | ICD-10-CM | POA: Diagnosis not present

## 2015-06-09 DIAGNOSIS — K5791 Diverticulosis of intestine, part unspecified, without perforation or abscess with bleeding: Secondary | ICD-10-CM | POA: Diagnosis present

## 2015-06-09 DIAGNOSIS — I872 Venous insufficiency (chronic) (peripheral): Secondary | ICD-10-CM | POA: Diagnosis present

## 2015-06-09 DIAGNOSIS — M4806 Spinal stenosis, lumbar region: Secondary | ICD-10-CM | POA: Diagnosis present

## 2015-06-09 DIAGNOSIS — S81811A Laceration without foreign body, right lower leg, initial encounter: Secondary | ICD-10-CM | POA: Diagnosis present

## 2015-06-09 DIAGNOSIS — Z96641 Presence of right artificial hip joint: Secondary | ICD-10-CM | POA: Diagnosis present

## 2015-06-09 DIAGNOSIS — R059 Cough, unspecified: Secondary | ICD-10-CM

## 2015-06-09 DIAGNOSIS — K5793 Diverticulitis of intestine, part unspecified, without perforation or abscess with bleeding: Secondary | ICD-10-CM

## 2015-06-09 DIAGNOSIS — Z1612 Extended spectrum beta lactamase (ESBL) resistance: Secondary | ICD-10-CM | POA: Diagnosis not present

## 2015-06-09 DIAGNOSIS — K921 Melena: Secondary | ICD-10-CM

## 2015-06-09 DIAGNOSIS — E785 Hyperlipidemia, unspecified: Secondary | ICD-10-CM | POA: Diagnosis present

## 2015-06-09 DIAGNOSIS — H3530 Unspecified macular degeneration: Secondary | ICD-10-CM | POA: Diagnosis present

## 2015-06-09 DIAGNOSIS — K625 Hemorrhage of anus and rectum: Secondary | ICD-10-CM

## 2015-06-09 DIAGNOSIS — R531 Weakness: Secondary | ICD-10-CM | POA: Diagnosis present

## 2015-06-09 DIAGNOSIS — M810 Age-related osteoporosis without current pathological fracture: Secondary | ICD-10-CM | POA: Diagnosis present

## 2015-06-09 DIAGNOSIS — X58XXXA Exposure to other specified factors, initial encounter: Secondary | ICD-10-CM | POA: Diagnosis present

## 2015-06-09 DIAGNOSIS — I10 Essential (primary) hypertension: Secondary | ICD-10-CM | POA: Diagnosis present

## 2015-06-09 DIAGNOSIS — L97511 Non-pressure chronic ulcer of other part of right foot limited to breakdown of skin: Secondary | ICD-10-CM | POA: Diagnosis not present

## 2015-06-09 LAB — URINALYSIS COMPLETE WITH MICROSCOPIC (ARMC ONLY)
Bilirubin Urine: NEGATIVE
Glucose, UA: NEGATIVE mg/dL
HGB URINE DIPSTICK: NEGATIVE
KETONES UR: NEGATIVE mg/dL
Nitrite: POSITIVE — AB
PH: 5 (ref 5.0–8.0)
PROTEIN: NEGATIVE mg/dL
SPECIFIC GRAVITY, URINE: 1.013 (ref 1.005–1.030)
Squamous Epithelial / LPF: NONE SEEN

## 2015-06-09 LAB — CBC WITH DIFFERENTIAL/PLATELET
BASOS ABS: 0.1 10*3/uL (ref 0–0.1)
BASOS PCT: 1 %
EOS PCT: 2 %
Eosinophils Absolute: 0.1 10*3/uL (ref 0–0.7)
HCT: 37.8 % (ref 35.0–47.0)
Hemoglobin: 12.7 g/dL (ref 12.0–16.0)
LYMPHS ABS: 1.7 10*3/uL (ref 1.0–3.6)
Lymphocytes Relative: 17 %
MCH: 30.3 pg (ref 26.0–34.0)
MCHC: 33.5 g/dL (ref 32.0–36.0)
MCV: 90.4 fL (ref 80.0–100.0)
MONOS PCT: 6 %
Monocytes Absolute: 0.6 10*3/uL (ref 0.2–0.9)
Neutro Abs: 7.6 10*3/uL — ABNORMAL HIGH (ref 1.4–6.5)
Neutrophils Relative %: 74 %
PLATELETS: 259 10*3/uL (ref 150–440)
RBC: 4.18 MIL/uL (ref 3.80–5.20)
RDW: 14.1 % (ref 11.5–14.5)
WBC: 10.2 10*3/uL (ref 3.6–11.0)

## 2015-06-09 LAB — PROTIME-INR
INR: 1.11
PROTHROMBIN TIME: 14.5 s (ref 11.4–15.0)

## 2015-06-09 LAB — COMPREHENSIVE METABOLIC PANEL
ALT: 13 U/L — AB (ref 14–54)
AST: 21 U/L (ref 15–41)
Albumin: 3.6 g/dL (ref 3.5–5.0)
Alkaline Phosphatase: 66 U/L (ref 38–126)
Anion gap: 8 (ref 5–15)
BUN: 44 mg/dL — AB (ref 6–20)
CHLORIDE: 101 mmol/L (ref 101–111)
CO2: 28 mmol/L (ref 22–32)
Calcium: 9.5 mg/dL (ref 8.9–10.3)
Creatinine, Ser: 0.98 mg/dL (ref 0.44–1.00)
GFR, EST AFRICAN AMERICAN: 53 mL/min — AB (ref >60)
GFR, EST NON AFRICAN AMERICAN: 46 mL/min — AB (ref >60)
Glucose, Bld: 133 mg/dL — ABNORMAL HIGH (ref 65–99)
Potassium: 3.9 mmol/L (ref 3.5–5.1)
Sodium: 137 mmol/L (ref 135–145)
Total Bilirubin: 0.8 mg/dL (ref 0.3–1.2)
Total Protein: 6.6 g/dL (ref 6.5–8.1)

## 2015-06-09 LAB — APTT: aPTT: 31 seconds (ref 24–36)

## 2015-06-09 LAB — TYPE AND SCREEN
ABO/RH(D): O NEG
Antibody Screen: NEGATIVE

## 2015-06-09 LAB — ABO/RH: ABO/RH(D): O NEG

## 2015-06-09 LAB — LIPASE, BLOOD: LIPASE: 30 U/L (ref 22–51)

## 2015-06-09 MED ORDER — SODIUM CHLORIDE 0.9 % IV SOLN
INTRAVENOUS | Status: DC
Start: 2015-06-09 — End: 2015-06-14
  Administered 2015-06-09 – 2015-06-13 (×6): via INTRAVENOUS

## 2015-06-09 MED ORDER — OCUVITE-LUTEIN PO CAPS
1.0000 | ORAL_CAPSULE | Freq: Every day | ORAL | Status: DC
  Administered 2015-06-11 – 2015-06-17 (×5): 1 via ORAL
  Filled 2015-06-09 (×5): qty 1

## 2015-06-09 MED ORDER — BENZONATATE 100 MG PO CAPS: 100.0000 mg | ORAL_CAPSULE | Freq: Three times a day (TID) | ORAL | Status: DC | PRN

## 2015-06-09 MED ORDER — ONDANSETRON HCL 4 MG/2ML IJ SOLN: 4.0000 mg | Freq: Four times a day (QID) | INTRAMUSCULAR | Status: DC | PRN

## 2015-06-09 MED ORDER — INFLUENZA VAC SPLIT QUAD 0.5 ML IM SUSY: 0.5000 mL | PREFILLED_SYRINGE | INTRAMUSCULAR | Status: DC

## 2015-06-09 MED ORDER — ACETAMINOPHEN 325 MG PO TABS
650.0000 mg | ORAL_TABLET | Freq: Four times a day (QID) | ORAL | Status: DC | PRN
  Administered 2015-06-12 – 2015-06-16 (×5): 650 mg via ORAL
  Filled 2015-06-09 (×6): qty 2

## 2015-06-09 MED ORDER — VITAMIN D 1000 UNITS PO TABS
2000.0000 [IU] | ORAL_TABLET | Freq: Every day | ORAL | Status: DC
  Administered 2015-06-11 – 2015-06-17 (×5): 2000 [IU] via ORAL
  Filled 2015-06-09 (×5): qty 2

## 2015-06-09 MED ORDER — SENNOSIDES-DOCUSATE SODIUM 8.6-50 MG PO TABS: 1.0000 | ORAL_TABLET | Freq: Every evening | ORAL | Status: DC | PRN

## 2015-06-09 MED ORDER — PANTOPRAZOLE SODIUM 40 MG IV SOLR
40.0000 mg | Freq: Two times a day (BID) | INTRAVENOUS | Status: DC
  Administered 2015-06-09 – 2015-06-17 (×16): 40 mg via INTRAVENOUS
  Filled 2015-06-09 (×16): qty 40

## 2015-06-09 MED ORDER — ALUM & MAG HYDROXIDE-SIMETH 200-200-20 MG/5ML PO SUSP: 30.0000 mL | Freq: Four times a day (QID) | ORAL | Status: DC | PRN

## 2015-06-09 MED ORDER — CHLORTHALIDONE 25 MG PO TABS
25.0000 mg | ORAL_TABLET | Freq: Every day | ORAL | Status: DC
  Administered 2015-06-11 – 2015-06-17 (×3): 25 mg via ORAL
  Filled 2015-06-09 (×5): qty 1

## 2015-06-09 MED ORDER — POLYVINYL ALCOHOL 1.4 % OP SOLN
1.0000 [drp] | Freq: Two times a day (BID) | OPHTHALMIC | Status: DC
  Administered 2015-06-09 – 2015-06-17 (×16): 1 [drp] via OPHTHALMIC
  Filled 2015-06-09 (×2): qty 15

## 2015-06-09 MED ORDER — LATANOPROST 0.005 % OP SOLN
1.0000 [drp] | Freq: Every day | OPHTHALMIC | Status: DC
  Administered 2015-06-09 – 2015-06-17 (×9): 1 [drp] via OPHTHALMIC
  Filled 2015-06-09: qty 2.5

## 2015-06-09 MED ORDER — SODIUM CHLORIDE 0.9 % IV SOLN
Freq: Once | INTRAVENOUS | Status: AC
  Administered 2015-06-09: 1000 mL via INTRAVENOUS

## 2015-06-09 MED ORDER — HYDROCODONE-ACETAMINOPHEN 5-325 MG PO TABS
1.0000 | ORAL_TABLET | Freq: Four times a day (QID) | ORAL | Status: DC | PRN
  Administered 2015-06-10 – 2015-06-17 (×3): 1 via ORAL
  Filled 2015-06-09 (×3): qty 1

## 2015-06-09 MED ORDER — ONDANSETRON HCL 4 MG PO TABS: 4.0000 mg | ORAL_TABLET | Freq: Four times a day (QID) | ORAL | Status: DC | PRN

## 2015-06-09 MED ORDER — TRANDOLAPRIL 2 MG PO TABS
2.0000 mg | ORAL_TABLET | Freq: Every day | ORAL | Status: DC
  Administered 2015-06-11 – 2015-06-17 (×3): 2 mg via ORAL
  Filled 2015-06-09 (×10): qty 1

## 2015-06-09 MED ORDER — AMLODIPINE BESYLATE 5 MG PO TABS
5.0000 mg | ORAL_TABLET | Freq: Every day | ORAL | Status: DC
  Administered 2015-06-11 – 2015-06-17 (×4): 5 mg via ORAL
  Filled 2015-06-09 (×4): qty 1

## 2015-06-09 MED ORDER — PRAMOXINE-ZINC OXIDE IN MO 1-12.5 % RE OINT
1.0000 "application " | TOPICAL_OINTMENT | Freq: Two times a day (BID) | RECTAL | Status: DC | PRN
  Filled 2015-06-09: qty 28.3

## 2015-06-09 MED ORDER — OXYBUTYNIN CHLORIDE ER 10 MG PO TB24
10.0000 mg | ORAL_TABLET | Freq: Every day | ORAL | Status: DC
  Administered 2015-06-11 – 2015-06-17 (×5): 10 mg via ORAL
  Filled 2015-06-09 (×7): qty 1

## 2015-06-09 MED ORDER — ACETAMINOPHEN 650 MG RE SUPP: 650.0000 mg | Freq: Four times a day (QID) | RECTAL | Status: DC | PRN

## 2015-06-09 MED ORDER — HYDROCODONE-HOMATROPINE 5-1.5 MG/5ML PO SYRP: 5.0000 mL | ORAL_SOLUTION | Freq: Four times a day (QID) | ORAL | Status: DC | PRN

## 2015-06-09 NOTE — ED Provider Notes (Signed)
[facility] Emergency Department Provider Note  ____________________________________________  Time seen: Approximately 3:48 PM  I have reviewed the triage vital signs and the nursing notes.   HISTORY  Chief Complaint Rectal Bleeding    HPI [name] is a 81 y.o. female who is very well appearing for her age presents with new, acute onset rectal bleeding today.  Reportedly had started at her nursing facility with some blood mixed with her stool.  However it has increased in volume after several episodes of sudden, urgent, and loose stools.  The patient states that there is bright blood separate from the stool itself.  She denies weakness, fever/chills, chest pain, shortness of breath, abdominal pain, nausea/vomiting, and dysuria.  She states that she has never had this kind of problem before.  She takes no anticoagulation medications (verified in her MAR).  She does not know when she last had a colonoscopy. She was seen by a wound care doctor today for chronic wounds of the lower extremities and they were redressed at that time.  Nothing makes the bleeding better, nothing makes it worse, and is described as moderate with gross blood per rectum.   Past Medical History  Diagnosis Date  . Macular degeneration   . Osteoporosis   . Hypertension   . Spinal stenosis of lumbar region   . Hyperlipidemia     Patient Active Problem List   Diagnosis Date Noted  . Traumatic open wound of right lower leg with delayed healing 06/02/2015  . Non-pressure chronic ulcer of left calf with fat layer exposed (HCC) 03/24/2015    Past Surgical History  Procedure Laterality Date  . Joint replacement Right     hip  . Abdominal hysterectomy      No current outpatient prescriptions on file.  Allergies Celebrex  No family history on file.  Social History Social History  Substance Use Topics  . Smoking status: Never Smoker   . Smokeless tobacco: None  . Alcohol  Use: No    Review of Systems Constitutional: No fever/chills Eyes: No visual changes. ENT: No sore throat. Cardiovascular: Denies chest pain. Respiratory: Denies shortness of breath. Gastrointestinal: No abdominal pain.  No nausea, no vomiting.  Loose, urgent stools today with gross blood per rectum. Genitourinary: Negative for dysuria. Musculoskeletal: Negative for back pain. Skin: Negative for rash. Neurological: Negative for headaches, focal weakness or numbness.  10-point ROS otherwise negative.  ____________________________________________   PHYSICAL EXAM:  VITAL SIGNS: ED Triage Vitals  Enc Vitals Group     BP 06/09/15 1537 125/65 mmHg     Pulse Rate 06/09/15 1537 83     Resp 06/09/15 1537 18     Temp 06/09/15 1537 97.8 F (36.6 C)     Temp Source 06/09/15 1537 Oral     SpO2 06/09/15 1537 94 %     Weight 06/09/15 1537 156 lb 8.4 oz (71 kg)     Height 06/09/15 1537 5\' 3"  (1.6 m)     Head Cir --      Peak Flow --      Pain Score 06/09/15 1539 0     Pain Loc --      Pain Edu? --      Excl. in GC? --     Constitutional: Alert and oriented. Well appearing and in no acute distress.  Appears younger than her chronological age Eyes: Conjunctivae are normal. PERRL. EOMI. Head: Atraumatic. Nose: No congestion/rhinnorhea. Mouth/Throat: Mucous membranes are moist.  Oropharynx non-erythematous.  Neck: No stridor.   Cardiovascular: Normal rate, regular rhythm. Grossly normal heart sounds.  Good peripheral circulation. Respiratory: Normal respiratory effort.  No retractions. Lungs CTAB. Gastrointestinal: Soft and nontender. No distention. No abdominal bruits. No CVA tenderness. Rectal:  Gross blood is evident around the anus.  On digital exam, there is copious blood that is not mixed with stool most consistent with hematochezia.  Stool is also palpated in the rectal vault. Musculoskeletal: No lower extremity tenderness nor edema.  No joint effusions. Neurologic:  Normal  speech and language. No gross focal neurologic deficits are appreciated.  Skin:  Skin is warm, dry and intact. No rash noted. Psychiatric: Mood and affect are normal. Speech and behavior are normal.  ____________________________________________   LABS (all labs ordered are listed, but only abnormal results are displayed)  Labs Reviewed  COMPREHENSIVE METABOLIC PANEL - Abnormal; Notable for the following:    Glucose, Bld 133 (*)    BUN 44 (*)    ALT 13 (*)    GFR calc non Af Amer 46 (*)    GFR calc Af Amer 53 (*)    All other components within normal limits  CBC WITH DIFFERENTIAL/PLATELET - Abnormal; Notable for the following:    Neutro Abs 7.6 (*)    All other components within normal limits  LIPASE, BLOOD  APTT  PROTIME-INR  URINALYSIS COMPLETEWITH MICROSCOPIC ([facility] ONLY)  TYPE AND SCREEN  ABO/RH   ____________________________________________  EKG  ED ECG REPORT I, Jaquaya Coyle, the attending physician, personally viewed and interpreted this ECG.   Date: 06/09/2015  EKG Time: 15:37  Rate: 83  Rhythm: Sinus rhythm with right bundle branch block  Axis: Normal  Intervals:right bundle branch block  ST&T Change: Non-specific ST segment / T-wave changes, but no evidence of acute ischemia. EKG is similar to prior  ____________________________________________  RADIOLOGY   No results found.  ____________________________________________   PROCEDURES  Procedure(s) performed: None  Critical Care performed: No ____________________________________________   INITIAL IMPRESSION / ASSESSMENT AND PLAN / ED COURSE  Pertinent labs & imaging results that were available during my care of the patient were reviewed by me and considered in my medical decision making (see chart for details).  The patient is well-appearing and in no acute distress with normal vitals at this time.  However, she has sudden onset of gross blood per rectum most consistent with hematochezia.  I  am concerned about the possibility of an AVM, neoplasm, diverticulosis, or other severe and potentially life-threatening causes, particularly given the patient's advanced age.  There is no indication for blood transfusion at this time but we are proceeding with 2 large-bore peripheral IVs, a normal saline infusion, type and screen, and other typical laboratory evaluation.  She will need admission for further evaluation of her new rectal bleeding.  ____________________________________________  FINAL CLINICAL IMPRESSION(S) / ED DIAGNOSES  Final diagnoses:  Hematochezia      NEW MEDICATIONS STARTED DURING THIS VISIT:  New Prescriptions   No medications on file     Loleta Rose, MD 06/09/15 1655

## 2015-06-09 NOTE — ED Notes (Signed)
Patient from [facility]. Began with minimal rectal bleeding today and has gotten worse as day went on.

## 2015-06-09 NOTE — H&P (Signed)
[facility] [place] [facility]   PATIENT NAME: [name]    MR#:  [id number]  DATE OF BIRTH:  March 28, 1913  DATE OF ADMISSION:  06/09/2015  PRIMARY CARE PHYSICIAN: Dr Yates DecampJohn Walker   REQUESTING/REFERRING PHYSICIAN: Dr York CeriseForbach  CHIEF COMPLAINT:  Bloody bowel movements HISTORY OF PRESENT ILLNESS:  [name]  is a 81 y.o. female with a known history of essential hypertension and macular degeneration who presents with above complaint. Patient reports approximately 2-3 weeks ago she noticed some blood when she wiped. However today she had 3 large bowel movements with bright red blood. She denies any abdominal pain associated with this. She was brought to the ER for further evaluation.  PAST MEDICAL HISTORY:   Past Medical History  Diagnosis Date  . Macular degeneration   . Osteoporosis   . Hypertension   . Spinal stenosis of lumbar region   . Hyperlipidemia     PAST SURGICAL HISTORY:   Past Surgical History  Procedure Laterality Date  . Joint replacement Right     hip  . Abdominal hysterectomy      SOCIAL HISTORY:   No tobacco alcohol or drug use [facility] is a [facility]  FAMILY HISTORY:  No history of CAD or hypertension  DRUG ALLERGIES:   Allergies  Allergen Reactions  . Celebrex [Celecoxib]      REVIEW OF SYSTEMS:  CONSTITUTIONAL: No fever, fatigue or weakness.  EYES: No blurred or double vision.  EARS, NOSE, AND THROAT: No tinnitus or ear pain.  RESPIRATORY: No cough, shortness of breath, wheezing or hemoptysis.  CARDIOVASCULAR: No chest pain, orthopnea, edema.  GASTROINTESTINAL: No nausea, vomiting, diarrhea or abdominal pain. + Bright red blood per rectum GENITOURINARY: No dysuria, hematuria.  ENDOCRINE: No polyuria, nocturia,  HEMATOLOGY: No anemia, easy bruising or bleeding SKIN: No rash or lesion. MUSCULOSKELETAL: Positive joint pain and arthritis.   NEUROLOGIC: No tingling, numbness,  weakness.  PSYCHIATRY: No anxiety or depression.   MEDICATIONS AT HOME:   Prior to Admission medications   Medication Sig Start Date End Date Taking? Authorizing Provider  acetaminophen (TYLENOL) 325 MG tablet Take 650 mg by mouth every 6 (six) hours as needed.   Yes Historical Provider, MD  amLODipine (NORVASC) 5 MG tablet Take 5 mg by mouth daily.   Yes Historical Provider, MD  benzonatate (TESSALON) 100 MG capsule Take 100 mg by mouth 3 (three) times daily as needed for cough.   Yes Historical Provider, MD  chlorthalidone (HYGROTON) 25 MG tablet Take 25 mg by mouth daily.   Yes Historical Provider, MD  Cholecalciferol (VITAMIN D3) 2000 UNITS TABS Take 1 capsule by mouth daily.   Yes Historical Provider, MD  ENSURE PLUS (ENSURE PLUS) LIQD Take 237 mLs by mouth.   Yes Historical Provider, MD  furosemide (LASIX) 20 MG tablet Take 20 mg by mouth.   Yes Historical Provider, MD  HYDROcodone-acetaminophen (NORCO/VICODIN) 5-325 MG tablet Take 1 tablet by mouth every 6 (six) hours as needed for moderate pain.   Yes Historical Provider, MD  HYDROcodone-homatropine (HYCODAN) 5-1.5 MG/5ML syrup Take 5 mLs by mouth every 6 (six) hours as needed for cough.   Yes Historical Provider, MD  hydroxypropyl methylcellulose / hypromellose (ISOPTO TEARS / GONIOVISC) 2.5 % ophthalmic solution Place 1 drop into both eyes 2 (two) times daily.   Yes Historical Provider, MD  latanoprost (XALATAN) 0.005 % ophthalmic solution Place 1 drop into both eyes at bedtime.   Yes Historical  Provider, MD  lidocaine (LIDODERM) 5 % Place 1 patch onto the skin daily. Remove & Discard patch within 12 hours or as directed by MD   Yes Historical Provider, MD  moexipril (UNIVASC) 15 MG tablet Take 15 mg by mouth daily.   Yes Historical Provider, MD  Multiple Vitamins-Minerals (PRESERVISION AREDS 2) CAPS Take 1 capsule by mouth daily.   Yes Historical Provider, MD  naproxen (NAPROSYN) 250 MG tablet Take 250 mg by mouth 3 (three) times  daily with meals.   Yes Historical Provider, MD  omeprazole (PRILOSEC) 20 MG capsule Take 20 mg by mouth daily.   Yes Historical Provider, MD  oxybutynin (DITROPAN-XL) 10 MG 24 hr tablet Take 10 mg by mouth daily.   Yes Historical Provider, MD  Polyethyl Glycol-Propyl Glycol (SYSTANE) 0.4-0.3 % SOLN Place 1 drop into both eyes as needed.   Yes Historical Provider, MD  polyethylene glycol (MIRALAX / GLYCOLAX) packet Take 17 g by mouth every other day.   Yes Historical Provider, MD  pramoxine-mineral oil-zinc (TUCKS HEMORRHOIDAL) 1-12.5 % rectal ointment Place 1 application rectally 2 (two) times daily as needed for itching.   Yes Historical Provider, MD  triamcinolone cream (KENALOG) 0.1 % Apply 1 application topically 2 (two) times daily. To face   Yes Historical Provider, MD      VITAL SIGNS:  Blood pressure 138/65, pulse 79, temperature 97.8 F (36.6 C), temperature source Oral, resp. rate 19, height  (1.6 m), weight 71 kg (156 lb 8.4 oz), SpO2 98 %.  PHYSICAL EXAMINATION:  GENERAL:  81 y.o.-year-old patient lying in the bed with no acute distress.  EYES: Pupils equal, round, reactive to light and accommodation. No scleral icterus. Marland Kitchen  HEENT: Head atraumatic, normocephalic. Oropharynxclear.  NECK:  Supple, no jugular venous distention. No thyroid enlargement, no tenderness.  LUNGS: Normal breath sounds bilaterally, no wheezing, rales,rhonchi or crepitation. No use of accessory muscles of respiration.  CARDIOVASCULAR: S1, S2 normal. No murmurs, rubs, or gallops.  ABDOMEN: Soft, nontender, nondistended. Bowel sounds present. No organomegaly or mass.  EXTREMITIES: No pedal edema, cyanosis, or clubbing.  NEUROLOGIC: Cranial nerves II through XII are grossly intact. No focal deficits. PSYCHIATRIC: The patient is alert and oriented x 3.  SKIN: she has bilateral wounds on shins   LABORATORY PANEL:   CBC  Recent Labs Lab 06/09/15 1554  WBC 10.2  HGB 12.7  HCT 37.8  PLT 259    ------------------------------------------------------------------------------------------------------------------  Chemistries   Recent Labs Lab 06/09/15 1554  NA 137  K 3.9  CL 101  CO2 28  GLUCOSE 133*  BUN 44*  CREATININE 0.98  CALCIUM 9.5  AST 21  ALT 13*  ALKPHOS 66  BILITOT 0.8   ------------------------------------------------------------------------------------------------------------------  Cardiac Enzymes No results for input(s): TROPONINI in the last 168 hours. ------------------------------------------------------------------------------------------------------------------  RADIOLOGY:  No results found.  EKG:   Normal sinus rhythm no ST elevation or depression. Heart rate 83 nonspecific T-wave changes  IMPRESSION AND PLAN:  81 year old female with a history of hypertension who presents with bright red blood per rectum. 1. Bright red blood per rectum: This is likely diverticular in nature. Patient is actively having bleeding so I will order GI bleeding scan. I will also consult GI for further evaluation. Patient is empirically on Protonix. NSAIDs will be discontinued for now. Hemoglobin will be ordered for midnight and then again in the a.m.  2. Essential hypertension: Patient's blood pressure is acceptable and therefore I will continue outpatient medications, including Norvasc and chlorthalidone.  3. Bilateral leg wounds POA: wound care consult placed   All the records are reviewed and case discussed with ED provider. Management plans discussed with the patient and family and she is in agreement.  CODE STATUS: DNR  TOTAL TIME TAKING CARE OF THIS PATIENT: 45 minutes.    [name] M.D on 06/09/2015 at 5:48 PM  Between 7am to 6pm - Pager - [phone] After 6pm go to [email or web address] - password [email or web address] [facility]  [facility]  Office  [phone]  CC: Primary care physician; No PCP Per Patient

## 2015-06-10 ENCOUNTER — Inpatient Hospital Stay: Payer: Medicare Other

## 2015-06-10 ENCOUNTER — Encounter: Payer: Self-pay | Admitting: Radiology

## 2015-06-10 LAB — HEMOGLOBIN AND HEMATOCRIT, BLOOD
HEMATOCRIT: 34.6 % — AB (ref 35.0–47.0)
Hemoglobin: 11.6 g/dL — ABNORMAL LOW (ref 12.0–16.0)

## 2015-06-10 LAB — CBC
HCT: 31.2 % — ABNORMAL LOW (ref 35.0–47.0)
Hemoglobin: 10.5 g/dL — ABNORMAL LOW (ref 12.0–16.0)
MCH: 30.6 pg (ref 26.0–34.0)
MCHC: 33.6 g/dL (ref 32.0–36.0)
MCV: 91 fL (ref 80.0–100.0)
PLATELETS: 201 10*3/uL (ref 150–440)
RBC: 3.43 MIL/uL — AB (ref 3.80–5.20)
RDW: 13.9 % (ref 11.5–14.5)
WBC: 7.8 10*3/uL (ref 3.6–11.0)

## 2015-06-10 LAB — HEMOGLOBIN
HEMOGLOBIN: 10.9 g/dL — AB (ref 12.0–16.0)
HEMOGLOBIN: 10.7 g/dL — AB (ref 12.0–16.0)

## 2015-06-10 MED ORDER — TECHNETIUM TC 99M-LABELED RED BLOOD CELLS IV KIT
20.0000 | PACK | Freq: Once | INTRAVENOUS | Status: AC | PRN
  Administered 2015-06-10: 21.16 via INTRAVENOUS

## 2015-06-10 MED ORDER — LORAZEPAM 2 MG/ML IJ SOLN
0.5000 mg | Freq: Once | INTRAMUSCULAR | Status: AC
  Administered 2015-06-10: 0.5 mg via INTRAVENOUS
  Filled 2015-06-10: qty 1

## 2015-06-10 NOTE — Consult Note (Signed)
WOC wound consult note Reason for Consult:Bilateral lower extremities wrapped in Unnas boots. Last application was 06/09/15 at [facility].  Will remove and re-apply Monday, per patient statement, "They were just done, let's wait" Wound type:Chronic venous insufficiency Pressure Ulcer POA: Yes Dressing procedure/placement/frequency:[facility]s [facility].  Will remove and re-apply Monday.  WOC team will follow.   Maple HudsonKaren Lukka Black RN BSN CWON Pager [phone]

## 2015-06-10 NOTE — Consult Note (Signed)
Consultation  Referring Provider:     Dr Juliene Pina Admit date: 06/09/15 Consult date        06/10/15 Reason for Consultation:     GIB         HPI:   [name] is a 81 y.o. female who resides at [facility], admitted with recent onset of rectal bleeding. Patient states she has been seeing some rectal bleeding over the last 2w. Yesterday this increased dramatically: states she was otherwise in her usual state of health, felt the urge to defecate, and passed a significant amount of blood in the commode. She had several other bloody urgent stools afterwards, and was brought to the ED for evaluation, subsequently admitted. She does not know if she had colonoscopy or when she had this last. She is hemodynamically stable. Admission 12.7, this morning's reading was 10.7. Bun elevated with normal creatinine. Pt/inr normal. Per her and nursing staff report, her stools are getting smaller and less frequent. Has had 3 bloody stools today, progressively smaller. Denies abdominal pain, NV, further GI complaints.    Past Medical History  Diagnosis Date  . Macular degeneration   . Osteoporosis   . Hypertension   . Spinal stenosis of lumbar region   . Hyperlipidemia   ovarian cancer arthritis  Past Surgical History  Procedure Laterality Date  . Joint replacement Right     hip  . Abdominal hysterectomy    orif right hip Appendectomy Cataract extraction  No family history on file. HTN (mother)  Social History  Substance Use Topics  . Smoking status: Never Smoker   . Smokeless tobacco: None  . Alcohol Use: No    Prior to Admission medications   Medication Sig Start Date End Date Taking? Authorizing Provider  acetaminophen (TYLENOL) 325 MG tablet Take 650 mg by mouth every 6 (six) hours as needed.   Yes Historical Provider, MD  amLODipine (NORVASC) 5 MG tablet Take 5 mg by mouth daily.   Yes Historical Provider, MD  benzonatate (TESSALON) 100 MG capsule Take 100 mg by mouth 3 (three) times daily  as needed for cough.   Yes Historical Provider, MD  chlorthalidone (HYGROTON) 25 MG tablet Take 25 mg by mouth daily.   Yes Historical Provider, MD  Cholecalciferol (VITAMIN D3) 2000 UNITS TABS Take 1 capsule by mouth daily.   Yes Historical Provider, MD  ENSURE PLUS (ENSURE PLUS) LIQD Take 237 mLs by mouth.   Yes Historical Provider, MD  furosemide (LASIX) 20 MG tablet Take 20 mg by mouth.   Yes Historical Provider, MD  HYDROcodone-acetaminophen (NORCO/VICODIN) 5-325 MG tablet Take 1 tablet by mouth every 6 (six) hours as needed for moderate pain.   Yes Historical Provider, MD  HYDROcodone-homatropine (HYCODAN) 5-1.5 MG/5ML syrup Take 5 mLs by mouth every 6 (six) hours as needed for cough.   Yes Historical Provider, MD  hydroxypropyl methylcellulose / hypromellose (ISOPTO TEARS / GONIOVISC) 2.5 % ophthalmic solution Place 1 drop into both eyes 2 (two) times daily.   Yes Historical Provider, MD  latanoprost (XALATAN) 0.005 % ophthalmic solution Place 1 drop into both eyes at bedtime.   Yes Historical Provider, MD  lidocaine (LIDODERM) 5 % Place 1 patch onto the skin daily. Remove & Discard patch within 12 hours or as directed by MD   Yes Historical Provider, MD  moexipril (UNIVASC) 15 MG tablet Take 15 mg by mouth daily.   Yes Historical Provider, MD  Multiple Vitamins-Minerals (PRESERVISION AREDS 2) CAPS Take 1 capsule by mouth  daily.   Yes Historical Provider, MD  naproxen (NAPROSYN) 250 MG tablet Take 250 mg by mouth 3 (three) times daily with meals.   Yes Historical Provider, MD  omeprazole (PRILOSEC) 20 MG capsule Take 20 mg by mouth daily.   Yes Historical Provider, MD  oxybutynin (DITROPAN-XL) 10 MG 24 hr tablet Take 10 mg by mouth daily.   Yes Historical Provider, MD  Polyethyl Glycol-Propyl Glycol (SYSTANE) 0.4-0.3 % SOLN Place 1 drop into both eyes as needed.   Yes Historical Provider, MD  polyethylene glycol (MIRALAX / GLYCOLAX) packet Take 17 g by mouth every other day.   Yes Historical  Provider, MD  pramoxine-mineral oil-zinc (TUCKS HEMORRHOIDAL) 1-12.5 % rectal ointment Place 1 application rectally 2 (two) times daily as needed for itching.   Yes Historical Provider, MD  triamcinolone cream (KENALOG) 0.1 % Apply 1 application topically 2 (two) times daily. To face   Yes Historical Provider, MD    Current Facility-Administered Medications  Medication Dose Route Frequency Provider Last Rate Last Dose  . 0.9 %  sodium chloride infusion   Intravenous Continuous Adrian Saran, MD 75 mL/hr at 06/09/15 2031    . acetaminophen (TYLENOL) tablet 650 mg  650 mg Oral Q6H PRN Adrian Saran, MD       Or  . acetaminophen (TYLENOL) suppository 650 mg  650 mg Rectal Q6H PRN Adrian Saran, MD      . alum & mag hydroxide-simeth (MAALOX/MYLANTA) 200-200-20 MG/5ML suspension 30 mL  30 mL Oral Q6H PRN Sital Mody, MD      . amLODipine (NORVASC) tablet 5 mg  5 mg Oral Daily Adrian Saran, MD   5 mg at 06/09/15 2032  . benzonatate (TESSALON) capsule 100 mg  100 mg Oral TID PRN Adrian Saran, MD      . chlorthalidone (HYGROTON) tablet 25 mg  25 mg Oral Daily Adrian Saran, MD   25 mg at 06/09/15 2032  . cholecalciferol (VITAMIN D) tablet 2,000 Units  2,000 Units Oral Daily Adrian Saran, MD   2,000 Units at 06/09/15 2032  . HYDROcodone-acetaminophen (NORCO/VICODIN) 5-325 MG per tablet 1 tablet  1 tablet Oral Q6H PRN Adrian Saran, MD      . HYDROcodone-homatropine (HYCODAN) 5-1.5 MG/5ML syrup 5 mL  5 mL Oral Q6H PRN Adrian Saran, MD      . Influenza vac split quadrivalent PF (FLUARIX) injection 0.5 mL  0.5 mL Intramuscular Tomorrow-1000 Sital Mody, MD      . latanoprost (XALATAN) 0.005 % ophthalmic solution 1 drop  1 drop Both Eyes QHS Adrian Saran, MD   1 drop at 06/09/15 2307  . multivitamin-lutein (OCUVITE-LUTEIN) capsule 1 capsule  1 capsule Oral Daily Adrian Saran, MD   1 capsule at 06/09/15 2032  . ondansetron (ZOFRAN) tablet 4 mg  4 mg Oral Q6H PRN Adrian Saran, MD       Or  . ondansetron (ZOFRAN) injection 4 mg  4 mg  Intravenous Q6H PRN Adrian Saran, MD      . oxybutynin (DITROPAN-XL) 24 hr tablet 10 mg  10 mg Oral Daily Adrian Saran, MD   10 mg at 06/09/15 2032  . pantoprazole (PROTONIX) injection 40 mg  40 mg Intravenous Q12H Adrian Saran, MD   40 mg at 06/09/15 2307  . polyvinyl alcohol (LIQUIFILM TEARS) 1.4 % ophthalmic solution 1 drop  1 drop Both Eyes BID Adrian Saran, MD   1 drop at 06/09/15 2308  . trandolapril (MAVIK) tablet 2 mg  2 mg Oral Daily  Adrian SaranSital Mody, MD   2 mg at 06/09/15 2032    Allergies as of 06/09/2015 - Review Complete 06/09/2015  Allergen Reaction Noted  . Celebrex [celecoxib]  01/31/2015     Review of Systems:    Yu systems reviewed and negative except where noted in HPI.  Marland Kitchen.    Physical Exam:  Vital signs in last 24 hours: Temp:  [97.5 F (36.4 C)-98.1 F (36.7 C)] 97.5 F (36.4 C) (10/14 0815) Pulse Rate:  [65-86] 65 (10/14 0815) Resp:  [16-24] 16 (10/14 0815) BP: (125-159)/(52-79) 134/52 mmHg (10/14 0815) SpO2:  [94 %-99 %] 99 % (10/14 0815) Weight:  [69.718 kg (153 lb 11.2 oz)-71 kg (156 lb 8.4 oz)] 69.718 kg (153 lb 11.2 oz) (10/13 1959) Last BM Date: 06/10/15 General:   Pleasant elderly woman in NAD Head:  Normocephalic and atraumatic. Eyes:   No icterus.   Conjunctiva pink. Ears:  HOH. Mouth: Mucosa pink moist, no lesions. Neck:  Supple; no masses felt Lungs: Respirations even and unlabored. Lungs clear to auscultation bilaterally.   No wheezes, crackles, or rhonchi.  Heart:  S1S2, RRR, no MRG. No edema. Abdomen:   Flat, soft, nondistended, nontender. Normal bowel sounds. No appreciable masses or hepatomegaly. No rebound signs or other peritoneal signs. Rectal:  [place] efflent with clots. Nontender. No masses.  Msk:  MAEW x4, No clubbing or cyanosis. Strength 5/5. Symmetrical without gross deformities. Neurologic:  Alert and  oriented x3;  Cranial nerves II-XII intact. Somewhat tangential and forgetful Skin:  Warm, dry, pink without significant lesions or  rashes. Psych:  Alert and cooperative. Normal affect.  LAB RESULTS:  Recent Labs  06/09/15 1554 06/10/15 0010 06/10/15 0440  WBC 10.2  --  7.8  HGB 12.7 10.7* 10.5*  HCT 37.8  --  31.2*  PLT 259  --  201   BMET  Recent Labs  06/09/15 1554  NA 137  K 3.9  CL 101  CO2 28  GLUCOSE 133*  BUN 44*  CREATININE 0.98  CALCIUM 9.5   LFT  Recent Labs  06/09/15 1554  PROT 6.6  ALBUMIN 3.6  AST 21  ALT 13*  ALKPHOS 66  BILITOT 0.8   PT/INR  Recent Labs  06/09/15 1554  LABPROT 14.5  INR 1.11    STUDIES: Nm Gi Blood Loss  06/10/2015  CLINICAL DATA:  Lower gastrointestinal bleeding. EXAM: NUCLEAR MEDICINE GASTROINTESTINAL BLEEDING SCAN TECHNIQUE: Sequential abdominal images were obtained following intravenous administration of Tc-3292m labeled red blood cells. RADIOPHARMACEUTICALS:  21.16 mCi Tc-2492m in-vitro labeled red cells. COMPARISON:  None. FINDINGS: No abnormal accumulation is seen to suggest active gastrointestinal bleeding. IMPRESSION: No scintigraphic evidence of active gastrointestinal bleeding. Electronically Signed   By: Lupita RaiderJames  Green Jr, M.D.   On: 06/10/2015 10:37       Impression / Plan:   1. Rectal bleeding: do suspect diverticular bleeding. This seems to be improving as her bloody stools are getting smaller and less frequent. Her hgb and vital signs are stable, and there is no abdominal pain. Would recommend rechecking hgb around 4 this pm, in the am, and following her clinical course, transfusing prn. Further recommendations to follow.  Thank you very much for this consult. These services were provided by Vevelyn Pathristiane Daritza Brees, NP-C, in collaboration with Christena DeemMartin U Skulskie, MD, with whom I have discussed this patient in full.   Vevelyn Pathristiane Cully Luckow, NP-C

## 2015-06-10 NOTE — Progress Notes (Signed)
[name] ([id number]) Visit Report for 06/09/2015 Chief Complaint Document Details Patient Name: [name], [name] 06/09/2015 10:45 Date of Service: AM Medical Record [id number] Number: Patient Account Number: [id number] 1913/07/31 (81 y.o. Treating RN: Curtis Sites Date of Birth/Sex: Female) Other Clinician: Primary Care Treating PATIENT, NO [name] Physician: Physician/Extender: Referring Physician: Valera Castle in Treatment: 14 Information Obtained from: Patient Chief Complaint Patient seen for complaints of Non-Healing Wound. pleasant 81 year old patient who had a fall and injured both lower extremities on 01/31/2015 Electronic Signature(s) Signed: 06/09/2015 11:19:27 AM By: Evlyn Kanner MD, FACS Entered By: Evlyn Kanner on 06/09/2015 11:19:27 [name], [name] ([id number]) -------------------------------------------------------------------------------- HPI Details Patient Name: [name], [name] 06/09/2015 10:45 Date of Service: AM Medical Record [id number] Number: Patient Account Number: [id number] 1912/12/16 (81 y.o. Treating RN: Curtis Sites Date of Birth/Sex: Female) Other Clinician: Primary Care Treating PATIENT, NO [name] Physician: Physician/Extender: Referring Physician: Valera Castle in Treatment: 14 History of Present Illness Location: both lower extremities Quality: Patient reports experiencing a dull pain to affected area(s). Severity: Patient states wound (s) are getting better. Duration: Patient has had the wound for < 4 weeks prior to presenting for treatment Timing: Pain in wound is Intermittent (comes and goes Context: The wound occurred when the patient had a fall and lacerated both lower extremities. Modifying Factors: Other treatment(s) tried include:she had sutures applied there for a while and when they were removed the wound opened up. Associated Signs and Symptoms: Patient reports having  difficulty standing for long periods. HPI Description: 81 year old female who fell and had an injury to her right forehead, right elbow and both shins on 01/31/2015. She went to the ER at [facility] and had been treated appropriately with sutures being placed to her right and left lower extremity. Also had a CT scan which showed no intracranial hemorrhage and only a large scalp hematoma on the right frontal region.she was put on Keflex for 5 days. she had also received Bactrim for 14 days.Since then she has been seen by her PCP and he had put her on Augmentin for 14 days and wound culture was obtained. culture reports were reviewed -- she grew an MRSA sensitive to tetracycline, Bactrim and vancomycin. Her past medical history significant for osteoporosis, hypertension, spinal stenosis, right hip replacement, appendectomy and abdomen hysterectomy for ovarian cancer. She has been seen in the past by the vascular surgery group at [facility] and she has an appointment to see them again. She is known to use lymphedema pumps while she has been in her assisted living home but has not been using them for the last month. She does also have compression stockings which she uses intermittently. 03/17/2015 -- She was seen by Dr. Gilda Crease on 03/03/2015 -- he reviewed the patientos case and recommended no surgical intervention. He has recommended compression stockings of the 20-30 mmHg variety and also recommended lymphedema pumps. He will see her back in 3 months. The patient has developed some blisters on both lower extremities and this may be due to the fact that she's had a Kerlix gauze wrap on both lower extremities and is also using the lymphedema pumps. 05/09/2015 -- she has a new wound on the dorsum of the right foot just proximal to the toes but this was probably a blister which opened out and is very superficial. Electronic Signature(s) Signed: 06/09/2015 11:19:32 AM By: Evlyn Kanner  MD, FACS Entered By: Evlyn Kanner on 06/09/2015 11:19:31 [name], [name] ([id number]) [name], Leah L. ([id number]) --------------------------------------------------------------------------------  Physical Exam Details Patient Name: [name], [name] 06/09/2015 10:45 Date of Service: AM Medical Record [id number] Number: Patient Account Number: [id number] Nov 29, 1912 (81 y.o. Treating RN: Curtis Sites Date of Birth/Sex: Female) Other Clinician: Primary Care Treating PATIENT, NO [name] Physician: Physician/Extender: Referring Physician: Valera Castle in Treatment: 14 Constitutional . Pulse regular. Respirations normal and unlabored. Afebrile. . Eyes Nonicteric. Reactive to light. Ears, Nose, Mouth, and Throat Lips, teeth, and gums WNL.Marland Kitchen Moist mucosa without lesions . Neck supple and nontender. No palpable supraclavicular or cervical adenopathy. Normal sized without goiter. Respiratory WNL. No retractions.. Cardiovascular Pedal Pulses WNL. No clubbing, cyanosis or edema. Lymphatic No adneopathy. No adenopathy. No adenopathy. Musculoskeletal Adexa without tenderness or enlargement.. Digits and nails w/o clubbing, cyanosis, infection, petechiae, ischemia, or inflammatory conditions.. Integumentary (Hair, Skin) No suspicious lesions. No crepitus or fluctuance. No peri-wound warmth or erythema. No masses.Marland Kitchen Psychiatric Judgement and insight Intact.. No evidence of depression, anxiety, or agitation.. Notes the wounds are looking cleaner and smaller and did not need any sharp debridement today. He still has persistent edema. Electronic Signature(s) Signed: 06/09/2015 11:20:08 AM By: Evlyn Kanner MD, FACS Entered By: Evlyn Kanner on 06/09/2015 11:20:08 [name], [name] ([id number]) -------------------------------------------------------------------------------- Physician Orders Details Patient Name: [name], [name] 06/09/2015 10:45 Date of  Service: AM Medical Record [id number] Number: Patient Account Number: [id number] July 21, 1913 (81 y.o. Treating RN: Curtis Sites Date of Birth/Sex: Female) Other Clinician: Primary Care Treating PATIENT, NO [name] Physician: Physician/Extender: Referring Physician: Valera Castle in Treatment: 14 Verbal / Phone Orders: Yes Clinician: Curtis Sites Read Back and Verified: Yes Diagnosis Coding Wound Cleansing Wound #1 Left,Anterior Lower Leg o Clean wound with Normal Saline. Wound #3 Right,Midline,Anterior Lower Leg o Clean wound with Normal Saline. Wound #6 Right,Anterior Lower Leg o Clean wound with Normal Saline. Anesthetic Wound #1 Left,Anterior Lower Leg o Topical Lidocaine 4% cream applied to wound bed prior to debridement Wound #3 Right,Midline,Anterior Lower Leg o Topical Lidocaine 4% cream applied to wound bed prior to debridement Wound #6 Right,Anterior Lower Leg o Topical Lidocaine 4% cream applied to wound bed prior to debridement Skin Barriers/Peri-Wound Care Wound #1 Left,Anterior Lower Leg o Moisturizing lotion Wound #3 Right,Midline,Anterior Lower Leg o Moisturizing lotion Wound #6 Right,Anterior Lower Leg o Moisturizing lotion Primary Wound Dressing Wound #1 Left,Anterior Lower Leg o Other: - sorbact Yu, Leah L. ([id number]) Wound #3 Right,Midline,Anterior Lower Leg o Other: - sorbact Wound #6 Right,Anterior Lower Leg o Other: - sorbact Secondary Dressing Wound #1 Left,Anterior Lower Leg o ABD and Kerlix/Conform - coban lightly - both kerlix and coban from just above toes to 3cm below knee Wound #3 Right,Midline,Anterior Lower Leg o ABD and Kerlix/Conform - coban lightly - both kerlix and coban from just above toes to 3cm below knee Wound #6 Right,Anterior Lower Leg o ABD and Kerlix/Conform - coban lightly - both kerlix and coban from just above toes to 3cm below knee Dressing Change  Frequency Wound #1 Left,Anterior Lower Leg o Change dressing every week - HHRN may change wrap if needed for discomfort or if wrap slips down Wound #3 Right,Midline,Anterior Lower Leg o Change dressing every week - HHRN may change wrap if needed for discomfort or if wrap slips down Wound #6 Right,Anterior Lower Leg o Change dressing every week - HHRN may change wrap if needed for discomfort or if wrap slips down Follow-up Appointments Wound #1 Left,Anterior Lower Leg o Return Appointment in 1 week. Wound #3 Right,Midline,Anterior Lower Leg o Return Appointment in 1 week.  Wound #6 Right,Anterior Lower Leg o Return Appointment in 1 week. Edema Control Wound #1 Left,Anterior Lower Leg o Other: - use lymph pumps daily Wound #3 Right,Midline,Anterior Lower Leg Kievit, Kaidence L. ([id number]) o Other: - use lymph pumps daily Wound #6 Right,Anterior Lower Leg o Other: - use lymph pumps daily Home Health Wound #1 Left,Anterior Lower Leg o Continue Home Health Visits - [facility] o Home Health Nurse may visit PRN to address patientos wound care needs. o FACE TO FACE ENCOUNTER: MEDICARE and MEDICAID PATIENTS: I certify that this patient is under my care and that I had a face-to-face encounter that meets the physician face-to-face encounter requirements with this patient on this date. The encounter with the patient was in whole or in part for the following MEDICAL CONDITION: (primary reason for Home Healthcare) MEDICAL NECESSITY: I certify, that based on my findings, NURSING services are a medically necessary home health service. HOME BOUND STATUS: I certify that my clinical findings support that this patient is homebound (i.e., Due to illness or injury, pt requires aid of supportive devices such as crutches, cane, wheelchairs, walkers, the use of special transportation or the assistance of another person to leave their place of residence. There is a normal inability to  leave the home and doing so requires considerable and taxing effort. Other absences are for medical reasons / religious services and are infrequent or of short duration when for other reasons). o If current dressing causes regression in wound condition, may D/C ordered dressing product/s and apply Normal Saline Moist Dressing daily until next Wound Healing Center / Other MD appointment. Notify Wound Healing Center of regression in wound condition at [phone]. o Please direct any NON-WOUND related issues/requests for orders to patient's Primary Care Physician Wound #3 Right,Midline,Anterior Lower Leg o Continue Home Health Visits - [facility] o Home Health Nurse may visit PRN to address patientos wound care needs. o FACE TO FACE ENCOUNTER: MEDICARE and MEDICAID PATIENTS: I certify that this patient is under my care and that I had a face-to-face encounter that meets the physician face-to-face encounter requirements with this patient on this date. The encounter with the patient was in whole or in part for the following MEDICAL CONDITION: (primary reason for Home Healthcare) MEDICAL NECESSITY: I certify, that based on my findings, NURSING services are a medically necessary home health service. HOME BOUND STATUS: I certify that my clinical findings support that this patient is homebound (i.e., Due to illness or injury, pt requires aid of supportive devices such as crutches, cane, wheelchairs, walkers, the use of special transportation or the assistance of another person to leave their place of residence. There is a normal inability to leave the home and doing so requires considerable and taxing effort. Other absences are for medical reasons / religious services and are infrequent or of short duration when for other reasons). o If current dressing causes regression in wound condition, may D/C ordered dressing product/s and apply Normal Saline Moist Dressing daily until next Wound  Healing Center / Other MD appointment. Notify Wound Healing Center of regression in wound condition at [phone]. o Please direct any NON-WOUND related issues/requests for orders to patient's Primary Care Physician Wound #6 Right,Anterior Lower Leg o Continue Home Health Visits - [name], [name] ([id number]) o Home Health Nurse may visit PRN to address patientos wound care needs. o FACE TO FACE ENCOUNTER: MEDICARE and MEDICAID PATIENTS: I certify that this patient is under my care and that I had a face-to-face encounter that  meets the physician face-to-face encounter requirements with this patient on this date. The encounter with the patient was in whole or in part for the following MEDICAL CONDITION: (primary reason for Home Healthcare) MEDICAL NECESSITY: I certify, that based on my findings, NURSING services are a medically necessary home health service. HOME BOUND STATUS: I certify that my clinical findings support that this patient is homebound (i.e., Due to illness or injury, pt requires aid of supportive devices such as crutches, cane, wheelchairs, walkers, the use of special transportation or the assistance of another person to leave their place of residence. There is a normal inability to leave the home and doing so requires considerable and taxing effort. Other absences are for medical reasons / religious services and are infrequent or of short duration when for other reasons). o If current dressing causes regression in wound condition, may D/C ordered dressing product/s and apply Normal Saline Moist Dressing daily until next Wound Healing Center / Other MD appointment. Notify Wound Healing Center of regression in wound condition at [phone]. o Please direct any NON-WOUND related issues/requests for orders to patient's Primary Care Physician Electronic Signature(s) Signed: 06/09/2015 3:40:39 PM By: Evlyn Kanner MD, FACS Signed: 06/09/2015 4:23:35 PM  By: Curtis Sites Entered By: Curtis Sites on 06/09/2015 11:18:18 [place], [name] ([id number]) -------------------------------------------------------------------------------- Problem List Details Patient Name: SIMA, LINDENBERGER 06/09/2015 10:45 Date of Service: AM Medical Record [id number] Number: Patient Account Number: [id number] 1912/09/22 (81 y.o. Treating RN: Curtis Sites Date of Birth/Sex: Female) Other Clinician: Primary Care Treating PATIENT, NO Reonna Finlayson Physician: Physician/Extender: Referring Physician: Valera Castle in Treatment: 14 Active Problems ICD-10 Encounter Code Description Active Date Diagnosis L97.212 Non-pressure chronic ulcer of right calf with fat layer 03/03/2015 Yes exposed L97.222 Non-pressure chronic ulcer of left calf with fat layer 03/03/2015 Yes exposed I89.0 Lymphedema, not elsewhere classified 03/03/2015 Yes L97.511 Non-pressure chronic ulcer of other part of right foot 04/29/2015 Yes limited to breakdown of skin Inactive Problems Resolved Problems Electronic Signature(s) Signed: 06/09/2015 11:19:19 AM By: Evlyn Kanner MD, FACS Entered By: Evlyn Kanner on 06/09/2015 11:19:19 [place], [name] ([id number]) -------------------------------------------------------------------------------- Progress Note Details Patient Name: [name]. 06/09/2015 10:45 Date of Service: AM Medical Record [id number] Number: Patient Account Number: [id number] [birth year] (81 y.o. Treating RN: Curtis Sites Date of Birth/Sex: Female) Other Clinician: Primary Care Treating PATIENT, NO Shenequa Howse Physician: Physician/Extender: Referring Physician: Valera Castle in Treatment: 14 Subjective Chief Complaint Information obtained from Patient Patient seen for complaints of Non-Healing Wound. pleasant 81 year old patient who had a fall and injured both lower extremities on 01/31/2015 History of Present Illness (HPI) The  following HPI elements were documented for the patient's wound: Location: both lower extremities Quality: Patient reports experiencing a dull pain to affected area(s). Severity: Patient states wound (s) are getting better. Duration: Patient has had the wound for < 4 weeks prior to presenting for treatment Timing: Pain in wound is Intermittent (comes and goes Context: The wound occurred when the patient had a fall and lacerated both lower extremities. Modifying Factors: Other treatment(s) tried include:she had sutures applied there for a while and when they were removed the wound opened up. Associated Signs and Symptoms: Patient reports having difficulty standing for long periods. 81 year old female who fell and had an injury to her right forehead, right elbow and both shins on 01/31/2015. She went to the ER at [name] and had been treated appropriately with sutures being placed to her right and left lower extremity. Also had a CT  scan which showed no intracranial hemorrhage and only a large scalp hematoma on the right frontal region.she was put on Keflex for 5 days. she had also received Bactrim for 14 days.Since then she has been seen by her PCP and he had put her on Augmentin for 14 days and wound culture was obtained. culture reports were reviewed -- she grew an MRSA sensitive to tetracycline, Bactrim and vancomycin. Her past medical history significant for osteoporosis, hypertension, spinal stenosis, right hip replacement, appendectomy and abdomen hysterectomy for ovarian cancer. She has been seen in the past by the vascular surgery group at [facility] and she has an appointment to see them again. She is known to use lymphedema pumps while she has been in her assisted living home but has not been using them for the last month. She does also have compression stockings which she uses intermittently. 03/17/2015 -- She was seen by Dr. Gilda Crease on 03/03/2015 -- he reviewed the  patient s case and recommended no surgical intervention. He has recommended compression stockings of the 20-30 mmHg variety and also recommended lymphedema pumps. He will see her back in 3 months. [place], Leah L. ([id number]) The patient has developed some blisters on both lower extremities and this may be due to the fact that she's had a Kerlix gauze wrap on both lower extremities and is also using the lymphedema pumps. 05/09/2015 -- she has a new wound on the dorsum of the right foot just proximal to the toes but this was probably a blister which opened out and is very superficial. Objective Constitutional Pulse regular. Respirations normal and unlabored. Afebrile. Vitals Time Taken: 10:53 AM, Height: 64 in, Weight: 147 lbs, BMI: 25.2, Temperature: 98.2 F, Pulse: 77 bpm, Respiratory Rate: 16 breaths/min, Blood Pressure: 129/64 mmHg. Eyes Nonicteric. Reactive to light. Ears, Nose, Mouth, and Throat Lips, teeth, and gums WNL.Marland Kitchen Moist mucosa without lesions . Neck supple and nontender. No palpable supraclavicular or cervical adenopathy. Normal sized without goiter. Respiratory WNL. No retractions.. Cardiovascular Pedal Pulses WNL. No clubbing, cyanosis or edema. Lymphatic No adneopathy. No adenopathy. No adenopathy. Musculoskeletal Adexa without tenderness or enlargement.. Digits and nails w/o clubbing, cyanosis, infection, petechiae, ischemia, or inflammatory conditions.Marland Kitchen Psychiatric Judgement and insight Intact.. No evidence of depression, anxiety, or agitation.. General Notes: the wounds are looking cleaner and smaller and did not need any sharp debridement today. He still has persistent edema. Integumentary (Hair, Skin) No suspicious lesions. No crepitus or fluctuance. No peri-wound warmth or erythema. No masses.Marland Kitchen Yu, Leah L. ([id number]) Wound #1 status is Open. Original cause of wound was Trauma. The wound is located on the Left,Anterior Lower Leg. The wound measures  5.3cm length x 2cm width x 0.2cm depth; 8.325cm^2 area and 1.665cm^3 volume. The wound is limited to skin breakdown. There is no tunneling or undermining noted. There is a medium amount of serosanguineous drainage noted. The wound margin is distinct with the outline attached to the wound base. There is large (67-100%) pink, pale granulation within the wound bed. There is a small (1-33%) amount of necrotic tissue within the wound bed including Eschar and Adherent Slough. The periwound skin appearance exhibited: Localized Edema, Moist. The periwound skin appearance did not exhibit: Callus, Crepitus, Excoriation, Fluctuance, Friable, Induration, Rash, Scarring, Dry/Scaly, Maceration, Atrophie Blanche, Cyanosis, Ecchymosis, Hemosiderin Staining, Mottled, Pallor, Rubor, Erythema. Periwound temperature was noted as No Abnormality. The periwound has tenderness on palpation. Wound #3 status is Open. Original cause of wound was Blister. The wound is located on the Right,Midline,Anterior Lower  Leg. The wound measures 2.3cm length x 1cm width x 0.1cm depth; 1.806cm^2 area and 0.181cm^3 volume. The wound is limited to skin breakdown. There is no tunneling or undermining noted. There is a small amount of serosanguineous drainage noted. The wound margin is indistinct and nonvisible. There is large (67-100%) pink, pale granulation within the wound bed. There is a small (1-33%) amount of necrotic tissue within the wound bed including Adherent Slough. The periwound skin appearance exhibited: Localized Edema, Moist. The periwound skin appearance did not exhibit: Callus, Crepitus, Excoriation, Fluctuance, Friable, Induration, Rash, Scarring, Dry/Scaly, Maceration, Atrophie Blanche, Cyanosis, Ecchymosis, Hemosiderin Staining, Mottled, Pallor, Rubor, Erythema. Periwound temperature was noted as No Abnormality. The periwound has tenderness on palpation. Wound #6 status is Open. Original cause of wound was Trauma. The  wound is located on the Right,Anterior Lower Leg. The wound measures 0.5cm length x 0.6cm width x 0.1cm depth; 0.236cm^2 area and 0.024cm^3 volume. The wound is limited to skin breakdown. There is no tunneling or undermining noted. There is a medium amount of serous drainage noted. The wound margin is flat and intact. There is large (67-100%) red granulation within the wound bed. There is a small (1-33%) amount of necrotic tissue within the wound bed including Eschar. The periwound skin appearance did not exhibit: Callus, Crepitus, Excoriation, Fluctuance, Friable, Induration, Localized Edema, Rash, Scarring, Dry/Scaly, Maceration, Moist, Atrophie Blanche, Cyanosis, Ecchymosis, Hemosiderin Staining, Mottled, Pallor, Rubor, Erythema. Periwound temperature was noted as No Abnormality. Assessment Active Problems ICD-10 L97.212 - Non-pressure chronic ulcer of right calf with fat layer exposed L97.222 - Non-pressure chronic ulcer of left calf with fat layer exposed I89.0 - Lymphedema, not elsewhere classified L97.511 - Non-pressure chronic ulcer of other part of right foot limited to breakdown of skin Petre, Leah L. ([id number]) I have recommended Sorbact with a 2 layer compression wrap. We have also encouraged her to use her lymphedema pumps daily and if possible twice daily. She will come back and see me next week. Plan Wound Cleansing: Wound #1 Left,Anterior Lower Leg: Clean wound with Normal Saline. Wound #3 Right,Midline,Anterior Lower Leg: Clean wound with Normal Saline. Wound #6 Right,Anterior Lower Leg: Clean wound with Normal Saline. Anesthetic: Wound #1 Left,Anterior Lower Leg: Topical Lidocaine 4% cream applied to wound bed prior to debridement Wound #3 Right,Midline,Anterior Lower Leg: Topical Lidocaine 4% cream applied to wound bed prior to debridement Wound #6 Right,Anterior Lower Leg: Topical Lidocaine 4% cream applied to wound bed prior to debridement Skin  Barriers/Peri-Wound Care: Wound #1 Left,Anterior Lower Leg: Moisturizing lotion Wound #3 Right,Midline,Anterior Lower Leg: Moisturizing lotion Wound #6 Right,Anterior Lower Leg: Moisturizing lotion Primary Wound Dressing: Wound #1 Left,Anterior Lower Leg: Other: - sorbact Wound #3 Right,Midline,Anterior Lower Leg: Other: - sorbact Wound #6 Right,Anterior Lower Leg: Other: - sorbact Secondary Dressing: Wound #1 Left,Anterior Lower Leg: ABD and Kerlix/Conform - coban lightly - both kerlix and coban from just above toes to 3cm below knee Wound #3 Right,Midline,Anterior Lower Leg: ABD and Kerlix/Conform - coban lightly - both kerlix and coban from just above toes to 3cm below knee Wound #6 Right,Anterior Lower Leg: ABD and Kerlix/Conform - coban lightly - both kerlix and coban from just above toes to 3cm below knee Dressing Change Frequency: Wound #1 Left,Anterior Lower Leg: Change dressing every week - HHRN may change wrap if needed for discomfort or if wrap slips down Wound #3 Right,Midline,Anterior Lower Leg: Shimada, Jamaia L. ([id number]) Change dressing every week - HHRN may change wrap if needed for discomfort or if wrap slips  down Wound #6 Right,Anterior Lower Leg: Change dressing every week - HHRN may change wrap if needed for discomfort or if wrap slips down Follow-up Appointments: Wound #1 Left,Anterior Lower Leg: Return Appointment in 1 week. Wound #3 Right,Midline,Anterior Lower Leg: Return Appointment in 1 week. Wound #6 Right,Anterior Lower Leg: Return Appointment in 1 week. Edema Control: Wound #1 Left,Anterior Lower Leg: Other: - use lymph pumps daily Wound #3 Right,Midline,Anterior Lower Leg: Other: - use lymph pumps daily Wound #6 Right,Anterior Lower Leg: Other: - use lymph pumps daily Home Health: Wound #1 Left,Anterior Lower Leg: Continue Home Health Visits - [facility] Nurse may visit PRN to address patient s wound care needs. FACE TO FACE  ENCOUNTER: MEDICARE and MEDICAID PATIENTS: I certify that this patient is under my care and that I had a face-to-face encounter that meets the physician face-to-face encounter requirements with this patient on this date. The encounter with the patient was in whole or in part for the following MEDICAL CONDITION: (primary reason for Home Healthcare) MEDICAL NECESSITY: I certify, that based on my findings, NURSING services are a medically necessary home health service. HOME BOUND STATUS: I certify that my clinical findings support that this patient is homebound (i.e., Due to illness or injury, pt requires aid of supportive devices such as crutches, cane, wheelchairs, walkers, the use of special transportation or the assistance of another person to leave their place of residence. There is a normal inability to leave the home and doing so requires considerable and taxing effort. Other absences are for medical reasons / religious services and are infrequent or of short duration when for other reasons). If current dressing causes regression in wound condition, may D/C ordered dressing product/s and apply Normal Saline Moist Dressing daily until next Wound Healing Center / Other MD appointment. Notify Wound Healing Center of regression in wound condition at [phone]. Please direct any NON-WOUND related issues/requests for orders to patient's Primary Care Physician Wound #3 Right,Midline,Anterior Lower Leg: Continue Home Health Visits - [facility] Nurse may visit PRN to address patient s wound care needs. FACE TO FACE ENCOUNTER: MEDICARE and MEDICAID PATIENTS: I certify that this patient is under my care and that I had a face-to-face encounter that meets the physician face-to-face encounter requirements with this patient on this date. The encounter with the patient was in whole or in part for the following MEDICAL CONDITION: (primary reason for Home Healthcare) MEDICAL NECESSITY: I  certify, that based on my findings, NURSING services are a medically necessary home health service. HOME BOUND STATUS: I certify that my clinical findings support that this patient is homebound (i.e., Due to illness or injury, pt requires aid of supportive devices such as crutches, cane, wheelchairs, walkers, the use of special transportation or the assistance of another person to leave their place of residence. There is a normal inability to leave the home and doing so requires considerable and taxing effort. Other absences are for medical reasons / religious services and are infrequent or of short duration when for other reasons). If current dressing causes regression in wound condition, may D/C ordered dressing product/s and apply Normal Saline Moist Dressing daily until next Wound Healing Center / Other MD appointment. Notify Wound Healing Center of regression in wound condition at [phone]. LAURIN, PAULO ([id number]) Please direct any NON-WOUND related issues/requests for orders to patient's Primary Care Physician Wound #6 Right,Anterior Lower Leg: Continue Home Health Visits - [facility] Nurse may visit PRN to address patient s  wound care needs. FACE TO FACE ENCOUNTER: MEDICARE and MEDICAID PATIENTS: I certify that this patient is under my care and that I had a face-to-face encounter that meets the physician face-to-face encounter requirements with this patient on this date. The encounter with the patient was in whole or in part for the following MEDICAL CONDITION: (primary reason for Home Healthcare) MEDICAL NECESSITY: I certify, that based on my findings, NURSING services are a medically necessary home health service. HOME BOUND STATUS: I certify that my clinical findings support that this patient is homebound (i.e., Due to illness or injury, pt requires aid of supportive devices such as crutches, cane, wheelchairs, walkers, the use of special transportation or the  assistance of another person to leave their place of residence. There is a normal inability to leave the home and doing so requires considerable and taxing effort. Other absences are for medical reasons / religious services and are infrequent or of short duration when for other reasons). If current dressing causes regression in wound condition, may D/C ordered dressing product/s and apply Normal Saline Moist Dressing daily until next Wound Healing Center / Other MD appointment. Notify Wound Healing Center of regression in wound condition at [phone]. Please direct any NON-WOUND related issues/requests for orders to patient's Primary Care Physician I have recommended Sorbact with a 2 layer compression wrap. We have also encouraged her to use her lymphedema pumps daily and if possible twice daily. She will come back and see me next week. Electronic Signature(s) Signed: 06/09/2015 11:21:03 AM By: Evlyn Kanner MD, FACS Entered By: Evlyn Kanner on 06/09/2015 11:21:03 Addo, [name] ([id number]) -------------------------------------------------------------------------------- SuperBill Details Patient Name: [name] Date of Service: 06/09/2015 Medical Record Number: [id number] Patient Account Number: [id number] Date of Birth/Sex: June 20, 1913 (81 y.o. Female) Treating RN: Curtis Sites Primary Care Physician: PATIENT, NO Other Clinician: Referring Physician: Marisue Ivan Treating Physician/Extender: Rudene Re in Treatment: 14 Diagnosis Coding ICD-10 Codes Code Description [phone] Non-pressure chronic ulcer of right calf with fat layer exposed L97.222 Non-pressure chronic ulcer of left calf with fat layer exposed I89.0 Lymphedema, not elsewhere classified L97.511 Non-pressure chronic ulcer of other part of right foot limited to breakdown of skin Facility Procedures CPT4 Code: 13244010 Description: 99214 - WOUND CARE VISIT-LEV 4 EST PT Modifier: Quantity:  1 Physician Procedures CPT4: Description Modifier Quantity Code [id number] 99213 - WC PHYS LEVEL 3 - EST PT 1 ICD-10 Description Diagnosis L97.212 Non-pressure chronic ulcer of right calf with fat layer exposed L97.222 Non-pressure chronic ulcer of left calf with fat layer  exposed I89.0 Lymphedema, not elsewhere classified L97.511 Non-pressure chronic ulcer of other part of right foot limited to breakdown of skin Electronic Signature(s) Signed: 06/09/2015 11:21:19 AM By: Evlyn Kanner MD, FACS Entered By: Evlyn Kanner on 06/09/2015 11:21:19

## 2015-06-10 NOTE — Progress Notes (Signed)
[facility] [place] [facility]   PATIENT NAME: [name]    MR#:  [id number]  DATE OF BIRTH:  03-29-1913  SUBJECTIVE:  CHIEF COMPLAINT:  Patient is resting comfortably. Denies any abdominal pain. Still noticing fresh blood in her stool. Denies any dizziness or loss of consciousness. She is very hard of hearing, but answering all questions appropriately  REVIEW OF SYSTEMS:  CONSTITUTIONAL: No fever, fatigue or weakness.  EYES: No blurred or double vision.  EARS, NOSE, AND THROAT: No tinnitus or ear pain.  RESPIRATORY: No cough, shortness of breath, wheezing or hemoptysis.  CARDIOVASCULAR: No chest pain, orthopnea, edema.  GASTROINTESTINAL: No nausea, vomiting, diarrhea or abdominal pain. Still reporting  blood in her stool but less quantity  GENITOURINARY: No dysuria, hematuria.  ENDOCRINE: No polyuria, nocturia,  HEMATOLOGY: No anemia, easy bruising or bleeding SKIN: No rash or lesion. MUSCULOSKELETAL: No joint pain or arthritis.   NEUROLOGIC: No tingling, numbness, weakness.  PSYCHIATRY: No anxiety or depression.   DRUG ALLERGIES:   Allergies  Allergen Reactions  . Celebrex [Celecoxib]     VITALS:  Blood pressure 150/52, pulse 79, temperature 97.9 F (36.6 C), temperature source Oral, resp. rate 18, height 5\' 3"  (1.6 m), weight 69.718 kg (153 lb 11.2 oz), SpO2 97 %.  PHYSICAL EXAMINATION:  GENERAL:  81 y.o.-year-old patient lying in the bed with no acute distress.  EYES: Pupils equal, round, reactive to light and accommodation. No scleral icterus.  HEENT: Head atraumatic, normocephalic. Oropharynx and nasopharynx clear.  NECK:  Supple, no jugular venous distention. No thyroid enlargement, no tenderness.  LUNGS: Normal breath sounds bilaterally, no wheezing, rales,rhonchi or crepitation. No use of accessory muscles of respiration.  CARDIOVASCULAR: S1, S2 normal. No murmurs, rubs, or gallops.  ABDOMEN: Soft, nontender, nondistended. Bowel  sounds present. No organomegaly or mass.  EXTREMITIES: No pedal edema, cyanosis, or clubbing.  NEUROLOGIC: Cranial nerves II through XII are intact. Muscle strength at baseline . Sensation intact. Gait not checked.  PSYCHIATRIC: The patient is alert and oriented x 3.  SKIN: No obvious rash, lesion, or ulcer.    LABORATORY PANEL:   CBC  Recent Labs Lab 06/10/15 0440  WBC 7.8  HGB 10.5*  HCT 31.2*  PLT 201   ------------------------------------------------------------------------------------------------------------------  Chemistries   Recent Labs Lab 06/09/15 1554  NA 137  K 3.9  CL 101  CO2 28  GLUCOSE 133*  BUN 44*  CREATININE 0.98  CALCIUM 9.5  AST 21  ALT 13*  ALKPHOS 66  BILITOT 0.8   ------------------------------------------------------------------------------------------------------------------  Cardiac Enzymes No results for input(s): TROPONINI in the last 168 hours. ------------------------------------------------------------------------------------------------------------------  RADIOLOGY:  Nm Gi Blood Loss  06/10/2015  CLINICAL DATA:  Lower gastrointestinal bleeding. EXAM: NUCLEAR MEDICINE GASTROINTESTINAL BLEEDING SCAN TECHNIQUE: Sequential abdominal images were obtained following intravenous administration of Tc-3770m labeled red blood cells. RADIOPHARMACEUTICALS:  21.16 mCi Tc-5370m in-vitro labeled red cells. COMPARISON:  None. FINDINGS: No abnormal accumulation is seen to suggest active gastrointestinal bleeding. IMPRESSION: No scintigraphic evidence of active gastrointestinal bleeding. Electronically Signed   By: Lupita RaiderJames  Green Jr, M.D.   On: 06/10/2015 10:37    EKG:   Orders placed or performed in visit on 06/09/15  . EKG 12-Lead  . EKG 12-Lead    ASSESSMENT AND PLAN:   81 year old female with a history of hypertension who presents with bright red blood per rectum. 1. Bright red blood per rectum: This is likely diverticular in nature.   GI  bleeding scan negative.  Follow-up with  GI for further evaluation. currently not considering any procedures.  Patient is empirically on Protonix.  NSAIDs will be discontinued for now.  Will closely monitor hemoglobin and hematocrit and transfuse as needed basis.   2. Essential hypertension: Patient's blood pressure is acceptable   continue outpatient medications, including Norvasc and chlorthalidone.   3. Bilateral leg wounds POA: wound care consult placed     All the records are reviewed and case discussed with Care Management/Social Workerr. Management plans discussed with the patient, family and they are in agreement.  CODE STATUS: DNR  TOTAL TIME TAKING CARE OF THIS PATIENT: 35 minutes.   POSSIBLE D/C IN 2-3 DAYS, DEPENDING ON CLINICAL CONDITION.   Ramonita Lab M.D on 06/10/2015 at 3:27 PM  Between 7am to 6pm - Pager - [phone] After 6pm go to [email or web address] - password [email or web address] [facility]  [facility]  Office  [phone]  CC: Primary care physician; No PCP Per Patient

## 2015-06-10 NOTE — Consult Note (Signed)
Please see full GI consult by Ms Kathryne HitchLondon.  Patient seen and examined.  Patient admitted for rectal bleeding.  Last bm about 1:45 pm.  Hemodynamically stable, hgb stable last one 10.9.   Passing flatus without stool leakage.  Continue serial hgb.  Negative bleeding scan last night, likely the small stools passed today are residual from the initial bleeding. Observe clinically, npo for now, continue ppi, serial hgb.  Will follow with you.  No plans for sedated procedure currently.  Following.

## 2015-06-10 NOTE — Progress Notes (Addendum)
Leah, Yu ([id number]) Visit Report for 06/09/2015 Arrival Information Details Patient Name: [name], [name] 06/09/2015 10:45 Date of Service: AM Medical Record [id number] Number: Patient Account Number: [id number] [birth year] (81 y.o. Treating RN: Curtis Sites Date of Birth/Sex: Female) Other Clinician: Primary Care Physician: PATIENT, NO Treating Britto, Errol Referring Physician: Marisue Ivan Physician/Extender: Weeks in Treatment: 14 Visit Information History Since Last Visit Added or deleted any medications: No Patient Arrived: Wheel Chair Any new allergies or adverse reactions: No Arrival Time: 10:50 Had a fall or experienced change in No activities of daily living that may affect Accompanied By: staff risk of falls: Transfer Assistance: Manual Signs or symptoms of abuse/neglect since last No Patient Identification Verified: Yes visito Secondary Verification Process Yes Hospitalized since last visit: No Completed: Pain Present Now: No Patient Requires Transmission-Based No Precautions: Patient Has Alerts: Yes Electronic Signature(s) Signed: 06/09/2015 4:23:35 PM By: Curtis Sites Entered By: Curtis Sites on 06/09/2015 10:50:42 Knisely, [name] ([id number]) -------------------------------------------------------------------------------- Clinic Level of Care Assessment Details Patient Name: Leah, Yu 06/09/2015 10:45 Date of Service: AM Medical Record [id number] Number: Patient Account Number: [id number] 16-Aug-1913 (81 y.o. Treating RN: Curtis Sites Date of Birth/Sex: Female) Other Clinician: Primary Care Physician: PATIENT, NO Treating Britto, Errol Referring Physician: Marisue Ivan Physician/Extender: Weeks in Treatment: 14 Clinic Level of Care Assessment Items TOOL 4 Quantity Score []  - Use when only an EandM is performed on FOLLOW-UP visit 0 ASSESSMENTS - Nursing Assessment / Reassessment X - Reassessment of  Co-morbidities (includes updates in patient status) 1 10 X - Reassessment of Adherence to Treatment Plan 1 5 ASSESSMENTS - Wound and Skin Assessment / Reassessment []  - Simple Wound Assessment / Reassessment - one wound 0 X - Complex Wound Assessment / Reassessment - multiple wounds 3 5 []  - Dermatologic / Skin Assessment (not related to wound area) 0 ASSESSMENTS - Focused Assessment X - Circumferential Edema Measurements - multi extremities 2 5 []  - Nutritional Assessment / Counseling / Intervention 0 X - Lower Extremity Assessment (monofilament, tuning fork, pulses) 1 5 []  - Peripheral Arterial Disease Assessment (using hand held doppler) 0 ASSESSMENTS - Ostomy and/or Continence Assessment and Care []  - Incontinence Assessment and Management 0 []  - Ostomy Care Assessment and Management (repouching, etc.) 0 PROCESS - Coordination of Care X - Simple Patient / Family Education for ongoing care 1 15 []  - Complex (extensive) Patient / Family Education for ongoing care 0 []  - Staff obtains Chiropractor, Records, Test Results / Process Orders 0 []  - Staff telephones HHA, Nursing Homes / Clarify orders / etc 0 Huttner, Jing L. ([id number]) []  - Routine Transfer to another Facility (non-emergent condition) 0 []  - Routine Hospital Admission (non-emergent condition) 0 []  - New Admissions / Manufacturing engineer / Ordering NPWT, Apligraf, etc. 0 []  - Emergency Hospital Admission (emergent condition) 0 X - Simple Discharge Coordination 1 10 []  - Complex (extensive) Discharge Coordination 0 PROCESS - Special Needs []  - Pediatric / Minor Patient Management 0 []  - Isolation Patient Management 0 []  - Hearing / Language / Visual special needs 0 []  - Assessment of Community assistance (transportation, D/C planning, etc.) 0 []  - Additional assistance / Altered mentation 0 []  - Support Surface(s) Assessment (bed, cushion, seat, etc.) 0 INTERVENTIONS - Wound Cleansing / Measurement []  - Simple Wound  Cleansing - one wound 0 X - Complex Wound Cleansing - multiple wounds 3 5 X - Wound Imaging (photographs - any number of wounds) 1 5 []  - Wound Tracing (instead of photographs)  0  - Simple Wound Measurement - one wound 0 X - Complex Wound Measurement - multiple wounds 3 5 INTERVENTIONS - Wound Dressings X - Small Wound Dressing one or multiple wounds 3 10  - Medium Wound Dressing one or multiple wounds 0  - Large Wound Dressing one or multiple wounds 0  - Application of Medications - topical 0  - Application of Medications - injection 0 Montag, Wilberta L. ([id number]) INTERVENTIONS - Miscellaneous  - External ear exam 0  - Specimen Collection (cultures, biopsies, blood, body fluids, etc.) 0  - Specimen(s) / Culture(s) sent or taken to Lab for analysis 0  - Patient Transfer (multiple staff / Michiel Sites Lift / Similar devices) 0  - Simple Staple / Suture removal (25 or less) 0  - Complex Staple / Suture removal (26 or more) 0  - Hypo / Hyperglycemic Management (close monitor of Blood Glucose) 0  - Ankle / Brachial Index (ABI) - do not check if billed separately 0 X - Vital Signs 1 5 Has the patient been seen at the hospital within the last three years: Yes Total Score: 140 Level Of Care: New/Established - Level 4 Electronic Signature(s) Signed: 06/09/2015 4:23:35 PM By: Curtis Sites Entered By: Curtis Sites on 06/09/2015 11:19:06 Jacob, [name] ([id number]) -------------------------------------------------------------------------------- Encounter Discharge Information Details Patient Name: [name]. 06/09/2015 10:45 Date of Service: AM Medical Record [id number] Number: Patient Account Number: [id number] 1913/08/07 (81 y.o. Treating RN: Curtis Sites Date of Birth/Sex: Female) Other Clinician: Primary Care Physician: PATIENT, NO Treating Britto, Errol Referring Physician: Marisue Ivan Physician/Extender: Weeks in Treatment: 14 Encounter  Discharge Information Items Discharge Pain Level: 0 Discharge Condition: Stable Ambulatory Status: Wheelchair Discharge Destination: Home Transportation: Private Auto Accompanied By: staff Schedule Follow-up Appointment: Yes Medication Reconciliation completed and provided to Patient/Care No Channie Bostick: Provided on Clinical Summary of Care: 06/09/2015 Form Type Recipient Paper Patient NP Electronic Signature(s) Signed: 06/09/2015 4:23:35 PM By: Curtis Sites Previous Signature: 06/09/2015 11:41:36 AM Version By: Francie Massing Entered By: Curtis Sites on 06/09/2015 11:42:04 Kinsella, [name] ([id number]) -------------------------------------------------------------------------------- Lower Extremity Assessment Details Patient Name: JACARI, KIRSTEN 06/09/2015 10:45 Date of Service: AM Medical Record [id number] Number: Patient Account Number: [id number] 1913-01-16 (81 y.o. Treating RN: Curtis Sites Date of Birth/Sex: Female) Other Clinician: Primary Care Physician: PATIENT, NO Treating Britto, Errol Referring Physician: Marisue Ivan Physician/Extender: Weeks in Treatment: 14 Edema Assessment Assessed: [Left: No] [Right: No] Edema: [Left: Yes] [Right: Yes] Calf Left: Right: Point of Measurement: 35 cm From Medial Instep 35 cm 35.5 cm Ankle Left: Right: Point of Measurement: 8 cm From Medial Instep 22.5 cm 23 cm Vascular Assessment Pulses: Posterior Tibial Dorsalis Pedis Palpable: [Left:Yes] [Right:Yes] Extremity colors, hair growth, and conditions: Extremity Color: [Left:Hyperpigmented] [Right:Hyperpigmented] Hair Growth on Extremity: [Left:No] [Right:No] Temperature of Extremity: [Left:Warm] [Right:Warm] Capillary Refill: [Left:< 3 seconds] [Right:< 3 seconds] Electronic Signature(s) Signed: 06/09/2015 4:23:35 PM By: Curtis Sites Entered By: Curtis Sites on 06/09/2015 11:02:17 Lawn, [name]  ([id number]) -------------------------------------------------------------------------------- Multi Wound Chart Details Patient Name: [name]. 06/09/2015 10:45 Date of Service: AM Medical Record [id number] Number: Patient Account Number: [id number] 1913-08-22 (81 y.o. Treating RN: Curtis Sites Date of Birth/Sex: Female) Other Clinician: Primary Care Physician: PATIENT, NO Treating Britto, Errol Referring Physician: Marisue Ivan Physician/Extender: Weeks in Treatment: 14 Vital Signs Height(in): 64 Pulse(bpm): 77 Weight(lbs): 147 Blood Pressure 129/64 (mmHg): Body Mass Index(BMI): 25 Temperature(F): 98.2 Respiratory Rate 16 (breaths/min): Photos: [1:No Photos] [3:No Photos] [6:No Photos] Wound Location: [1:Left Lower Leg - Anterior Right  Lower Leg - Midline, Right Lower Leg - Anterior] [3:Anterior] Wounding Event: [1:Trauma] [3:Blister] [6:Trauma] Primary Etiology: [1:Skin Tear] [3:Lymphedema] [6:Trauma, Other] Comorbid History: [1:Hypertension, Osteoarthritis] [3:Hypertension, Osteoarthritis] [6:Hypertension, Osteoarthritis] Date Acquired: [1:01/31/2015] [3:03/14/2015] [6:06/09/2015] Weeks of Treatment: [1:14] [3:12] [6:0] Wound Status: [1:Open] [3:Open] [6:Open] Measurements L x W x D 5.3x2x0.2 [3:2.3x1x0.1] [6:0.5x0.6x0.1] (cm) Area (cm) : [1:8.325] [3:1.806] [6:0.236] Volume (cm) : [1:1.665] [3:0.181] [6:0.024] % Reduction in Area: [1:76.80%] [3:61.70%] [6:0.00%] % Reduction in Volume: 84.50% [3:61.60%] [6:0.00%] Classification: [1:Full Thickness Without Exposed Support Structures] [3:Partial Thickness] [6:Partial Thickness] Exudate Amount: [1:Medium] [3:Small] [6:Medium] Exudate Type: [1:Serosanguineous] [3:Serosanguineous] [6:Serous] Exudate Color: [1:red, brown] [3:red, brown] [6:amber] Wound Margin: [1:Distinct, outline attached Indistinct, nonvisible] [6:Flat and Intact] Granulation Amount: [1:Large (67-100%)] [3:Large (67-100%)] [6:Large  (67-100%)] Granulation Quality: [1:Pink, Pale] [3:Pink, Pale] [6:Red] Necrotic Amount: [1:Small (1-33%)] [3:Small (1-33%)] [6:Small (1-33%)] Necrotic Tissue: [1:Eschar, Adherent Slough Adherent Slough] [6:Eschar] Exposed Structures: Dirk, Edeline L. ([id number]) Fascia: No Fascia: No Fascia: No Fat: No Fat: No Fat: No Tendon: No Tendon: No Tendon: No Muscle: No Muscle: No Muscle: No Joint: No Joint: No Joint: No Bone: No Bone: No Bone: No Limited to Skin Limited to Skin Limited to Skin Breakdown Breakdown Breakdown Epithelialization: Small (1-33%) Large (67-100%) Small (1-33%) Periwound Skin Texture: Edema: Yes Edema: Yes Edema: No Excoriation: No Excoriation: No Excoriation: No Induration: No Induration: No Induration: No Callus: No Callus: No Callus: No Crepitus: No Crepitus: No Crepitus: No Fluctuance: No Fluctuance: No Fluctuance: No Friable: No Friable: No Friable: No Rash: No Rash: No Rash: No Scarring: No Scarring: No Scarring: No Periwound Skin Moist: Yes Moist: Yes Maceration: No Moisture: Maceration: No Maceration: No Moist: No Dry/Scaly: No Dry/Scaly: No Dry/Scaly: No Periwound Skin Color: Atrophie Blanche: No Atrophie Blanche: No Atrophie Blanche: No Cyanosis: No Cyanosis: No Cyanosis: No Ecchymosis: No Ecchymosis: No Ecchymosis: No Erythema: No Erythema: No Erythema: No Hemosiderin Staining: No Hemosiderin Staining: No Hemosiderin Staining: No Mottled: No Mottled: No Mottled: No Pallor: No Pallor: No Pallor: No Rubor: No Rubor: No Rubor: No Temperature: No Abnormality No Abnormality No Abnormality Tenderness on Yes Yes No Palpation: Wound Preparation: Ulcer Cleansing: Ulcer Cleansing: Ulcer Cleansing: Rinsed/Irrigated with Rinsed/Irrigated with Rinsed/Irrigated with Saline Saline Saline Topical Anesthetic Topical Anesthetic Topical Anesthetic Applied: Other: lidocaine Applied: Other: lidocaine Applied: Other:  lidocaine 4% 4% 4% Treatment Notes Electronic Signature(s) Signed: 06/09/2015 11:10:46 AM By: Curtis Sitesorthy, Joanna Entered By: Curtis Sitesorthy, Joanna on 06/09/2015 11:10:46 Eutsler, Lelon HuhNANCY L. ([id number]) -------------------------------------------------------------------------------- Multi-Disciplinary Care Plan Details Patient Name: Leah GooENDER, Frieda L. 06/09/2015 10:45 Date of Service: AM Medical Record [id number] Number: Patient Account Number: [id number] 1913-03-10 (81 y.o. Treating RN: Curtis Sitesorthy, Joanna Date of Birth/Sex: Female) Other Clinician: Primary Care Physician: PATIENT, NO Treating Britto, Errol Referring Physician: Marisue IvanLinthavong, Kanhka Physician/Extender: Weeks in Treatment: 14 Active Inactive Electronic Signature(s) Signed: 07/11/2015 5:01:16 PM By: Curtis Sitesorthy, Joanna Signed: 07/25/2015 5:17:25 PM By: Elliot GurneyWoody, RN, BSN, Kim RN, BSN Previous Signature: 06/09/2015 11:10:31 AM Version By: Curtis Sitesorthy, Joanna Entered By: Elliot GurneyWoody, RN, BSN, Kim on 07/07/2015 08:07:30 Conway, Lelon HuhNANCY L. ([id number]) -------------------------------------------------------------------------------- Patient/Caregiver Education Details Patient Name: Leah GooENDER, Mozetta L. 06/09/2015 10:45 Date of Service: AM Medical Record [id number] Number: Patient Account Number: [id number] 1913-03-10 (81 y.o. Treating RN: Curtis Sitesorthy, Joanna Date of Birth/Gender: Female) Other Clinician: Primary Care Physician: PATIENT, NO Treating Britto, Errol Referring Physician: Marisue IvanLinthavong, Kanhka Physician/Extender: Weeks in Treatment: 14 Education Assessment Education Provided To: Patient Education Topics Provided Venous: Handouts: Controlling Swelling with Multilayered Compression Wraps Methods: Demonstration, Explain/Verbal, Printed Responses: State content correctly Electronic Signature(s) Signed: 06/09/2015 4:23:35  PM By: Curtis Sites Entered By: Curtis Sites on 06/09/2015 11:42:21 Bartus, [name]  ([id number]) -------------------------------------------------------------------------------- Wound Assessment Details Patient Name: SHALIE, SCHREMP 06/09/2015 10:45 Date of Service: AM Medical Record [id number] Number: Patient Account Number: [id number] 01/27/1913 (81 y.o. Treating RN: Curtis Sites Date of Birth/Sex: Female) Other Clinician: Primary Care Physician: PATIENT, NO Treating Britto, Errol Referring Physician: Marisue Ivan Physician/Extender: Weeks in Treatment: 14 Wound Status Wound Number: 1 Primary Etiology: Skin Tear Wound Location: Left Lower Leg - Anterior Wound Status: Open Wounding Event: Trauma Comorbid History: Hypertension, Osteoarthritis Date Acquired: 01/31/2015 Weeks Of Treatment: 14 Clustered Wound: No Photos Photo Uploaded By: Curtis Sites on 06/09/2015 12:17:07 Wound Measurements Length: (cm) 5.3 Width: (cm) 2 Depth: (cm) 0.2 Area: (cm) 8.325 Volume: (cm) 1.665 % Reduction in Area: 76.8% % Reduction in Volume: 84.5% Epithelialization: Small (1-33%) Tunneling: No Undermining: No Wound Description Full Thickness Without Exposed Foul Odor After Classification: Support Structures Wound Margin: Distinct, outline attached Exudate Medium Amount: Exudate Type: Serosanguineous Exudate Color: red, brown Cleansing: No Wound Bed Granulation Amount: Large (67-100%) Exposed Structure Shipley, Gesselle L. ([id number]) Granulation Quality: Pink, Pale Fascia Exposed: No Necrotic Amount: Small (1-33%) Fat Layer Exposed: No Necrotic Quality: Eschar, Adherent Slough Tendon Exposed: No Muscle Exposed: No Joint Exposed: No Bone Exposed: No Limited to Skin Breakdown Periwound Skin Texture Texture Color No Abnormalities Noted: No No Abnormalities Noted: No Callus: No Atrophie Blanche: No Crepitus: No Cyanosis: No Excoriation: No Ecchymosis: No Fluctuance: No Erythema: No Friable: No Hemosiderin Staining: No Induration:  No Mottled: No Localized Edema: Yes Pallor: No Rash: No Rubor: No Scarring: No Temperature / Pain Moisture Temperature: No Abnormality No Abnormalities Noted: No Tenderness on Palpation: Yes Dry / Scaly: No Maceration: No Moist: Yes Wound Preparation Ulcer Cleansing: Rinsed/Irrigated with Saline Topical Anesthetic Applied: Other: lidocaine 4%, Electronic Signature(s) Signed: 06/09/2015 11:09:09 AM By: Curtis Sites Entered By: Curtis Sites on 06/09/2015 11:09:09 Hurd, [name] ([id number]) -------------------------------------------------------------------------------- Wound Assessment Details Patient Name: ALLYANA, VOGAN 06/09/2015 10:45 Date of Service: AM Medical Record [id number] Number: Patient Account Number: [id number] Apr 25, 1913 (81 y.o. Treating RN: Curtis Sites Date of Birth/Sex: Female) Other Clinician: Primary Care Physician: PATIENT, NO Treating Britto, Errol Referring Physician: Marisue Ivan Physician/Extender: Weeks in Treatment: 14 Wound Status Wound Number: 3 Primary Etiology: Lymphedema Wound Location: Right Lower Leg - Midline, Wound Status: Open Anterior Comorbid History: Hypertension, Osteoarthritis Wounding Event: Blister Date Acquired: 03/14/2015 Weeks Of Treatment: 12 Clustered Wound: No Photos Photo Uploaded By: Curtis Sites on 06/09/2015 12:17:08 Wound Measurements Length: (cm) 2.3 Width: (cm) 1 Depth: (cm) 0.1 Area: (cm) 1.806 Volume: (cm) 0.181 % Reduction in Area: 61.7% % Reduction in Volume: 61.6% Epithelialization: Large (67-100%) Tunneling: No Undermining: No Wound Description Classification: Partial Thickness Foul Odor After Wound Margin: Indistinct, nonvisible Exudate Amount: Small Exudate Type: Serosanguineous Exudate Color: red, brown Cleansing: No Wound Bed Granulation Amount: Large (67-100%) Exposed Structure Granulation Quality: Pink, Pale Fascia Exposed: No Kobayashi, Cyrah L.  ([id number]) Necrotic Amount: Small (1-33%) Fat Layer Exposed: No Necrotic Quality: Adherent Slough Tendon Exposed: No Muscle Exposed: No Joint Exposed: No Bone Exposed: No Limited to Skin Breakdown Periwound Skin Texture Texture Color No Abnormalities Noted: No No Abnormalities Noted: No Callus: No Atrophie Blanche: No Crepitus: No Cyanosis: No Excoriation: No Ecchymosis: No Fluctuance: No Erythema: No Friable: No Hemosiderin Staining: No Induration: No Mottled: No Localized Edema: Yes Pallor: No Rash: No Rubor: No Scarring: No Temperature / Pain Moisture Temperature: No Abnormality No Abnormalities Noted: No Tenderness on Palpation:  Yes Dry / Scaly: No Maceration: No Moist: Yes Wound Preparation Ulcer Cleansing: Rinsed/Irrigated with Saline Topical Anesthetic Applied: Other: lidocaine 4%, Electronic Signature(s) Signed: 06/09/2015 11:09:31 AM By: Curtis Sites Entered By: Curtis Sites on 06/09/2015 11:09:31 Anne, [name] ([id number]) -------------------------------------------------------------------------------- Wound Assessment Details Patient Name: AERIONNA, MORAVEK 06/09/2015 10:45 Date of Service: AM Medical Record [id number] Number: Patient Account Number: [id number] Aug 24, 1913 (81 y.o. Treating RN: Curtis Sites Date of Birth/Sex: Female) Other Clinician: Primary Care Physician: PATIENT, NO Treating Britto, Errol Referring Physician: Marisue Ivan Physician/Extender: Weeks in Treatment: 14 Wound Status Wound Number: 6 Primary Etiology: Trauma, Other Wound Location: Right Lower Leg - Anterior Wound Status: Open Wounding Event: Trauma Comorbid History: Hypertension, Osteoarthritis Date Acquired: 06/09/2015 Weeks Of Treatment: 0 Clustered Wound: No Photos Photo Uploaded By: Curtis Sites on 06/09/2015 12:17:23 Wound Measurements Length: (cm) 0.5 Width: (cm) 0.6 Depth: (cm) 0.1 Area: (cm) 0.236 Volume: (cm) 0.024 %  Reduction in Area: 0% % Reduction in Volume: 0% Epithelialization: Small (1-33%) Tunneling: No Undermining: No Wound Description Classification: Partial Thickness Foul Odor After Wound Margin: Flat and Intact Exudate Amount: Medium Exudate Type: Serous Exudate Color: amber Cleansing: No Wound Bed Granulation Amount: Large (67-100%) Exposed Structure Granulation Quality: Red Fascia Exposed: No Necrotic Amount: Small (1-33%) Fat Layer Exposed: No Majer, Phyllistine L. ([id number]) Necrotic Quality: Eschar Tendon Exposed: No Muscle Exposed: No Joint Exposed: No Bone Exposed: No Limited to Skin Breakdown Periwound Skin Texture Texture Color No Abnormalities Noted: No No Abnormalities Noted: No Callus: No Atrophie Blanche: No Crepitus: No Cyanosis: No Excoriation: No Ecchymosis: No Fluctuance: No Erythema: No Friable: No Hemosiderin Staining: No Induration: No Mottled: No Localized Edema: No Pallor: No Rash: No Rubor: No Scarring: No Temperature / Pain Moisture Temperature: No Abnormality No Abnormalities Noted: No Dry / Scaly: No Maceration: No Moist: No Wound Preparation Ulcer Cleansing: Rinsed/Irrigated with Saline Topical Anesthetic Applied: Other: lidocaine 4%, Electronic Signature(s) Signed: 06/09/2015 11:10:24 AM By: Curtis Sites Entered By: Curtis Sites on 06/09/2015 11:10:24 Cecere, [name] ([id number]) -------------------------------------------------------------------------------- Vitals Details Patient Name: HADASSA, CERMAK 06/09/2015 10:45 Date of Service: AM Medical Record [id number] Number: Patient Account Number: [id number] [birth year] (81 y.o. Treating RN: Curtis Sites Date of Birth/Sex: Female) Other Clinician: Primary Care Physician: PATIENT, NO Treating Britto, Errol Referring Physician: Marisue Ivan Physician/Extender: Weeks in Treatment: 14 Vital Signs Time Taken: 10:53 Temperature (F): 98.2 Height (in): 64 Pulse  (bpm): 77 Weight (lbs): 147 Respiratory Rate (breaths/min): 16 Body Mass Index (BMI): 25.2 Blood Pressure (mmHg): 129/64 Reference Range: 80 - 120 mg / dl Electronic Signature(s) Signed: 06/09/2015 4:23:35 PM By: Curtis Sites Entered By: Curtis Sites on 06/09/2015 10:54:07

## 2015-06-10 NOTE — Progress Notes (Signed)
Initial Nutrition Assessment      INTERVENTION:  Coordination of care: await diet progression   NUTRITION DIAGNOSIS:   Inadequate oral intake related to altered GI function as evidenced by NPO status.    GOAL:   Patient will meet greater than or equal to 90% of their needs    MONITOR:    (Energy intake, Digestive system)  REASON FOR ASSESSMENT:   Malnutrition Screening Tool    ASSESSMENT:      Pt admitted with GI bleed, possible diverticular in nature per MD note  Past Medical History  Diagnosis Date  . Macular degeneration   . Osteoporosis   . Hypertension   . Spinal stenosis of lumbar region   . Hyperlipidemia     Current Nutrition: NPO  Food/Nutrition-Related History: unable to speak with pt during rounds this pm, nursing working with pt   Medications: NS at 3275ml/hr, senekot  Electrolyte/Renal Profile and Glucose Profile:   Recent Labs Lab 06/09/15 1554  NA 137  K 3.9  CL 101  CO2 28  BUN 44*  CREATININE 0.98  CALCIUM 9.5  GLUCOSE 133*   Protein Profile:  Recent Labs Lab 06/09/15 1554  ALBUMIN 3.6    Gastrointestinal Profile: Last BM:10/14   Nutrition-Focused Physical Exam Findings:  Unable to complete Nutrition-Focused physical exam at this time.     Weight Change: 7% weight loss in the last 4 months per wt encounters    Diet Order:  Diet NPO time specified Except for: Sips with Meds  Skin:   reviewed   Height:   Ht Readings from Last 1 Encounters:  06/09/15 5\' 3"  (1.6 m)    Weight:   Wt Readings from Last 1 Encounters:  06/09/15 153 lb 11.2 oz (69.718 kg)     BMI:  Body mass index is 27.23 kg/(m^2).  Estimated Nutritional Needs:   Kcal:  Using IBW of 52kg BEE 864 kcals (IF 1.0-1.3, AF 1.2) 8416-60631036-1347 kcals/d  Protein:  Using IBW of 52kg (1.0-1.2 g/kg) 52-62 g/d  Fluid:  Using IBW of 52kg (30-7635ml/kg) 1560-182120ml/d  EDUCATION NEEDS:   No education needs identified at this time  MODERATE Care  Level  Erick Murin B. Freida BusmanAllen, RD, LDN [phone] (pager)

## 2015-06-10 NOTE — Progress Notes (Signed)
Dr. Anne HahnWillis notified about pt pulling at tele monitor, removing O2 and pulse and IV site.  Pt  Also attempted to get OOB. Requesting for PRN sitter and possible ativan.  MD will place orders.

## 2015-06-11 LAB — CBC
HEMATOCRIT: 35 % (ref 35.0–47.0)
Hemoglobin: 11.6 g/dL — ABNORMAL LOW (ref 12.0–16.0)
MCH: 30.3 pg (ref 26.0–34.0)
MCHC: 33 g/dL (ref 32.0–36.0)
MCV: 91.6 fL (ref 80.0–100.0)
PLATELETS: 256 10*3/uL (ref 150–440)
RBC: 3.82 MIL/uL (ref 3.80–5.20)
RDW: 13.8 % (ref 11.5–14.5)
WBC: 14 10*3/uL — AB (ref 3.6–11.0)

## 2015-06-11 MED ORDER — LORAZEPAM 2 MG/ML IJ SOLN
0.5000 mg | Freq: Four times a day (QID) | INTRAMUSCULAR | Status: DC | PRN
  Administered 2015-06-11 – 2015-06-14 (×3): 0.5 mg via INTRAVENOUS
  Filled 2015-06-11 (×4): qty 1

## 2015-06-11 MED ORDER — HYDROCORTISONE 2.5 % RE CREA
TOPICAL_CREAM | Freq: Three times a day (TID) | RECTAL | Status: DC
  Administered 2015-06-11 – 2015-06-13 (×7): via RECTAL
  Administered 2015-06-13: 1 via RECTAL
  Administered 2015-06-14 – 2015-06-17 (×8): via RECTAL
  Filled 2015-06-11: qty 28.35

## 2015-06-11 NOTE — Consult Note (Signed)
Subjective: Patient seen for rectal bleeding. No bowel movement recorded since last night at 1954 hrs. That was recorded as brown. Patient denies any nausea or abdominal pain. Is now on a clear liquid diet.  Objective: Vital signs in last 24 hours: Temp:  [98 F (36.7 C)] 98 F (36.7 C) (10/15 1354) Pulse Rate:  [88-89] 88 (10/15 1356) Resp:  [16-18] 18 (10/15 1354) BP: (147-162)/(59-66) 147/59 mmHg (10/15 1356) SpO2:  [95 %-100 %] 95 % (10/15 1356) FiO2 (%):  [25 %] 25 % (10/14 2135) Blood pressure 147/59, pulse 88, temperature 98 F (36.7 C), temperature source Oral, resp. rate 18, height 5\' 3"  (1.6 m), weight 69.718 kg (153 lb 11.2 oz), SpO2 95 %.   Intake/Output from previous day: 10/14 0701 - 10/15 0700 In: 1395.8 [I.V.:1395.8] Out: 700 [Urine:700]  Intake/Output this shift: Total I/O In: 490 [I.V.:490] Out: 0    General appearance:  Elderly female no acute distress Resp:  Clear to auscultation Cardio:  Regular rate and rhythm GI:  Soft nontender nondistended bowel sounds positive and normoactive Extremities:  No clubbing or cyanosis or edema   Lab Results: Results for orders placed or performed during the hospital encounter of 06/09/15 (from the past 24 hour(s))  Hemoglobin     Status: Abnormal   Collection Time: 06/10/15  3:37 PM  Result Value Ref Range   Hemoglobin 10.9 (L) 12.0 - 16.0 g/dL  Hemoglobin and hematocrit, blood     Status: Abnormal   Collection Time: 06/10/15 11:41 PM  Result Value Ref Range   Hemoglobin 11.6 (L) 12.0 - 16.0 g/dL   HCT 62.934.6 (L) 52.835.0 - 41.347.0 %  CBC     Status: Abnormal   Collection Time: 06/11/15  7:23 AM  Result Value Ref Range   WBC 14.0 (H) 3.6 - 11.0 K/uL   RBC 3.82 3.80 - 5.20 MIL/uL   Hemoglobin 11.6 (L) 12.0 - 16.0 g/dL   HCT 24.435.0 01.035.0 - 27.247.0 %   MCV 91.6 80.0 - 100.0 fL   MCH 30.3 26.0 - 34.0 pg   MCHC 33.0 32.0 - 36.0 g/dL   RDW 53.613.8 64.411.5 - 03.414.5 %   Platelets 256 150 - 440 K/uL      Recent Labs  06/09/15 1554   06/10/15 0440 06/10/15 1537 06/10/15 2341 06/11/15 0723  WBC 10.2  --  7.8  --   --  14.0*  HGB 12.7  < > 10.5* 10.9* 11.6* 11.6*  HCT 37.8  --  31.2*  --  34.6* 35.0  PLT 259  --  201  --   --  256  < > = values in this interval not displayed. BMET  Recent Labs  06/09/15 1554  NA 137  K 3.9  CL 101  CO2 28  GLUCOSE 133*  BUN 44*  CREATININE 0.98  CALCIUM 9.5   LFT  Recent Labs  06/09/15 1554  PROT 6.6  ALBUMIN 3.6  AST 21  ALT 13*  ALKPHOS 66  BILITOT 0.8   PT/INR  Recent Labs  06/09/15 1554  LABPROT 14.5  INR 1.11   Hepatitis Panel No results for input(s): HEPBSAG, HCVAB, HEPAIGM, HEPBIGM in the last 72 hours. C-Diff No results for input(s): CDIFFTOX in the last 72 hours. No results for input(s): CDIFFPCR in the last 72 hours.   Studies/Results: Nm Gi Blood Loss  06/10/2015  CLINICAL DATA:  Lower gastrointestinal bleeding. EXAM: NUCLEAR MEDICINE GASTROINTESTINAL BLEEDING SCAN TECHNIQUE: Sequential abdominal images were obtained following intravenous administration  of Tc-71m labeled red blood cells. RADIOPHARMACEUTICALS:  21.16 mCi Tc-57m in-vitro labeled red cells. COMPARISON:  None. FINDINGS: No abnormal accumulation is seen to suggest active gastrointestinal bleeding. IMPRESSION: No scintigraphic evidence of active gastrointestinal bleeding. Electronically Signed   By: Lupita Raider, M.D.   On: 06/10/2015 10:37    Scheduled Inpatient Medications:   . amLODipine  5 mg Oral Daily  . chlorthalidone  25 mg Oral Daily  . cholecalciferol  2,000 Units Oral Daily  . Influenza vac split quadrivalent PF  0.5 mL Intramuscular Tomorrow-1000  . latanoprost  1 drop Both Eyes QHS  . multivitamin-lutein  1 capsule Oral Daily  . oxybutynin  10 mg Oral Daily  . pantoprazole (PROTONIX) IV  40 mg Intravenous Q12H  . polyvinyl alcohol  1 drop Both Eyes BID  . trandolapril  2 mg Oral Daily    Continuous Inpatient Infusions:   . sodium chloride 50 mL/hr at  06/11/15 1351    PRN Inpatient Medications:  acetaminophen **OR** acetaminophen, alum & mag hydroxide-simeth, benzonatate, HYDROcodone-acetaminophen, HYDROcodone-homatropine, LORazepam, ondansetron **OR** ondansetron (ZOFRAN) IV  Miscellaneous:   Assessment:  1. Hematochezia. From history and current presentation most likely internal hemorrhoid bleeding versus diverticular. No bowel movement since last night. That bowel movement showed no gross bleeding. Hemodynamically stable. Hemoglobin increased.  Plan:  1. Continue current observation. Would treat her for internal hemorrhoids with rectal hydrocortisone cream 3 times a day for 10 days. No plans for luminal evaluation. If she has recurrent significant bleeding would consider flexible sigmoidoscopy.  Christena Deem MD 06/11/2015, 2:12 PM

## 2015-06-11 NOTE — Progress Notes (Signed)
[facility] [place] [facility]   PATIENT NAME: [name]    MR#:  [id number]  DATE OF BIRTH:  1913-07-19  SUBJECTIVE:  CHIEF COMPLAINT:  Patient is resting comfortably. Not sure if she is still bleeding per rectum or not. Denies any abdominal pain. Patient was agitated overnight but improved with Ativan. Denies any dizziness or loss of consciousness. She is very hard of hearing, but answering all questions appropriately  REVIEW OF SYSTEMS:  CONSTITUTIONAL: No fever, fatigue or weakness.  EYES: No blurred or double vision.  EARS, NOSE, AND THROAT: No tinnitus or ear pain.  RESPIRATORY: No cough, shortness of breath, wheezing or hemoptysis.  CARDIOVASCULAR: No chest pain, orthopnea, edema.  GASTROINTESTINAL: No nausea, vomiting, diarrhea or abdominal pain.  GENITOURINARY: No dysuria, hematuria.  ENDOCRINE: No polyuria, nocturia,  HEMATOLOGY: No anemia, easy bruising or bleeding SKIN: No rash or lesion. MUSCULOSKELETAL: No joint pain or arthritis.   NEUROLOGIC: No tingling, numbness, weakness.  PSYCHIATRY: No anxiety or depression.   DRUG ALLERGIES:   Allergies  Allergen Reactions  . Celebrex [Celecoxib]     VITALS:  Blood pressure 162/66, pulse 89, temperature 98 F (36.7 C), temperature source Oral, resp. rate 16, height 5\' 3"  (1.6 m), weight 69.718 kg (153 lb 11.2 oz), SpO2 100 %.  PHYSICAL EXAMINATION:  GENERAL:  81 y.o.-year-old patient lying in the bed with no acute distress.  EYES: Pupils equal, round, reactive to light and accommodation. No scleral icterus.  HEENT: Head atraumatic, normocephalic. Oropharynx and nasopharynx clear.  NECK:  Supple, no jugular venous distention. No thyroid enlargement, no tenderness.  LUNGS: Normal breath sounds bilaterally, no wheezing, rales,rhonchi or crepitation. No use of accessory muscles of respiration.  CARDIOVASCULAR: S1, S2 normal. No murmurs, rubs, or gallops.  ABDOMEN: Soft, nontender,  nondistended. Bowel sounds present. No organomegaly or mass.  EXTREMITIES: No pedal edema, cyanosis, or clubbing.  NEUROLOGIC: Cranial nerves II through XII are intact. Muscle strength at baseline . Sensation intact. Gait not checked.  PSYCHIATRIC: The patient is alert and oriented x 3.  SKIN: No obvious rash, lesion, or ulcer.    LABORATORY PANEL:   CBC  Recent Labs Lab 06/11/15 0723  WBC 14.0*  HGB 11.6*  HCT 35.0  PLT 256   ------------------------------------------------------------------------------------------------------------------  Chemistries   Recent Labs Lab 06/09/15 1554  NA 137  K 3.9  CL 101  CO2 28  GLUCOSE 133*  BUN 44*  CREATININE 0.98  CALCIUM 9.5  AST 21  ALT 13*  ALKPHOS 66  BILITOT 0.8   ------------------------------------------------------------------------------------------------------------------  Cardiac Enzymes No results for input(s): TROPONINI in the last 168 hours. ------------------------------------------------------------------------------------------------------------------  RADIOLOGY:  Nm Gi Blood Loss  06/10/2015  CLINICAL DATA:  Lower gastrointestinal bleeding. EXAM: NUCLEAR MEDICINE GASTROINTESTINAL BLEEDING SCAN TECHNIQUE: Sequential abdominal images were obtained following intravenous administration of Tc-959m labeled red blood cells. RADIOPHARMACEUTICALS:  21.16 mCi Tc-4659m in-vitro labeled red cells. COMPARISON:  None. FINDINGS: No abnormal accumulation is seen to suggest active gastrointestinal bleeding. IMPRESSION: No scintigraphic evidence of active gastrointestinal bleeding. Electronically Signed   By: Lupita RaiderJames  Green Jr, M.D.   On: 06/10/2015 10:37    EKG:   Orders placed or performed in visit on 06/09/15  . EKG 12-Lead  . EKG 12-Lead    ASSESSMENT AND PLAN:   81 year old female with a history of hypertension who presents with bright red blood per rectum. 1. Bright red blood per rectum: secondary to diverticular  bleed, internal hemorrhoids   GI bleeding scan  negative.  Follow-up with  GI for further evaluation. currently not considering any procedures. He might consider flex sigmoidoscopy on Monday if hemoglobin is dropping We will start patient on clear liquid diet with no red Patient is empirically on Protonix.  NSAIDs will be discontinued for now.  Will closely monitor hemoglobin and hematocrit and transfuse as needed basis.   2. Essential hypertension: Patient's blood pressure is acceptable   continue outpatient medications, including Norvasc and chlorthalidone.   3. Bilateral leg wounds POA: Continue wound care      All the records are reviewed and case discussed with Care Management/Social Workerr. Management plans discussed with the patient, family and they are in agreement.  CODE STATUS: DNR  TOTAL TIME TAKING CARE OF THIS PATIENT: 35 minutes.   POSSIBLE D/C IN 2-3 DAYS, DEPENDING ON CLINICAL CONDITION.   Ramonita Lab M.D on 06/11/2015 at 1:00 PM  Between 7am to 6pm - Pager - [phone] After 6pm go to [email or web address] - password [email or web address] [facility]  [place] [facility]  Office  [phone]  CC: Primary care physician; No PCP Per Patient

## 2015-06-11 NOTE — Progress Notes (Signed)
Notified Dr Amado CoeGouru of pt becoming increasingly agitated; Dr acknowledged and stated she would put in orders

## 2015-06-11 NOTE — Progress Notes (Signed)
   06/11/15 0541  Urine Characteristics  Bladder Scan Volume (mL) 944 mL  Spoke with Dr. Betti Cruzeddy in reference to pt unable to void since 2030.  Bladder scan result shown above.  Requesting for in and out cath once. MD stated that's fine.

## 2015-06-11 NOTE — Progress Notes (Signed)
Pt continues to be impulsive and restless throughout night. Alert and oriented x1.  Unable to redirect pt at times.  Pt pulled out 2 IVs last night and tugging at tele monitor and taking off clothing.  Attempted to get OOB. Sitter in room at this time.

## 2015-06-12 LAB — CBC
HEMATOCRIT: 33.9 % — AB (ref 35.0–47.0)
HEMOGLOBIN: 11.6 g/dL — AB (ref 12.0–16.0)
MCH: 31.6 pg (ref 26.0–34.0)
MCHC: 34.3 g/dL (ref 32.0–36.0)
MCV: 92.1 fL (ref 80.0–100.0)
Platelets: 252 10*3/uL (ref 150–440)
RBC: 3.68 MIL/uL — ABNORMAL LOW (ref 3.80–5.20)
RDW: 14.3 % (ref 11.5–14.5)
WBC: 9.4 10*3/uL (ref 3.6–11.0)

## 2015-06-12 NOTE — Progress Notes (Signed)
[facility] - [place] at [facility]   PATIENT NAME: [name]    MR#:  [id number]  DATE OF BIRTH:  [birth year]  SUBJECTIVE:  CHIEF COMPLAINT:  Patient is resting comfortably.  Denies any abdominal pain. She is very hard of hearing, but answering all questions appropriately  REVIEW OF SYSTEMS:  CONSTITUTIONAL: No fever, fatigue or weakness.  EYES: No blurred or double vision.  EARS, NOSE, AND THROAT: No tinnitus or ear pain.  RESPIRATORY: No cough, shortness of breath, wheezing or hemoptysis.  CARDIOVASCULAR: No chest pain, orthopnea, edema.  GASTROINTESTINAL: No nausea, vomiting, diarrhea or abdominal pain.  GENITOURINARY: No dysuria, hematuria.  ENDOCRINE: No polyuria, nocturia,  HEMATOLOGY: No anemia, easy bruising or bleeding SKIN: No rash or lesion. MUSCULOSKELETAL: No joint pain or arthritis.   NEUROLOGIC: No tingling, numbness, weakness.  PSYCHIATRY: No anxiety or depression.   DRUG ALLERGIES:   Allergies  Allergen Reactions  . Celebrex [Celecoxib]     VITALS:  Blood pressure 145/73, pulse 93, temperature 97.9 F (36.6 C), temperature source Oral, resp. rate 18, height  (1.6 m), weight 69.718 kg (153 lb 11.2 oz), SpO2 96 %.  PHYSICAL EXAMINATION:  GENERAL:  81 y.o.-year-old patient lying in the bed with no acute distress.  EYES: Pupils equal, round, reactive to light and accommodation. No scleral icterus.  HEENT: Head atraumatic, normocephalic. Oropharynx and nasopharynx clear.  NECK:  Supple, no jugular venous distention. No thyroid enlargement, no tenderness.  LUNGS: Normal breath sounds bilaterally, no wheezing, rales,rhonchi or crepitation. No use of accessory muscles of respiration.  CARDIOVASCULAR: S1, S2 normal. No murmurs, rubs, or gallops.  ABDOMEN: Soft, nontender, nondistended. Bowel sounds present. No organomegaly or mass.  EXTREMITIES: No pedal edema, cyanosis, or clubbing.  NEUROLOGIC: Cranial nerves II through XII are  intact. Muscle strength at baseline . Sensation intact. Gait not checked.  PSYCHIATRIC: The patient is alert and oriented x 3.  SKIN: No obvious rash, lesion, or ulcer.    LABORATORY PANEL:   CBC  Recent Labs Lab 06/12/15 0544  WBC 9.4  HGB 11.6*  HCT 33.9*  PLT 252   ------------------------------------------------------------------------------------------------------------------  Chemistries   Recent Labs Lab 06/09/15 1554  NA 137  K 3.9  CL 101  CO2 28  GLUCOSE 133*  BUN 44*  CREATININE 0.98  CALCIUM 9.5  AST 21  ALT 13*  ALKPHOS 66  BILITOT 0.8   ------------------------------------------------------------------------------------------------------------------  Cardiac Enzymes No results for input(s): TROPONINI in the last 168 hours. ------------------------------------------------------------------------------------------------------------------  RADIOLOGY:  No results found.  EKG:   Orders placed or performed in visit on 06/09/15  . EKG 12-Lead  . EKG 12-Lead    ASSESSMENT AND PLAN:   81 year old female with a history of hypertension who presents with bright red blood per rectum. 1. Bright red blood per rectum: secondary to diverticular bleed, internal hemorrhoids   GI bleeding scan negative. Continue per rectal hydrocortisone cream for internal hemorrhoids  Follow-up with  GI for further evaluation. currently not considering any procedures. He might consider flex sigmoidoscopy on Monday if hemoglobin is dropping We will start patient on full liquid diet and advance as tolerated to soft diet as hemoglobin is stable at 11.6   Patient is on Protonix.  NSAIDs will be discontinued for now.  Will closely monitor hemoglobin and hematocrit and transfuse as needed basis.   2. Essential hypertension: Patient's blood pressure is acceptable   continue outpatient medications, including Norvasc and chlorthalidone.   3. Bilateral leg wounds POA:  Continue  wound care      All the records are reviewed and case discussed with Care Management/Social Workerr. Management plans discussed with the patient, family and they are in agreement.  CODE STATUS: DNR  TOTAL TIME TAKING CARE OF THIS PATIENT: 35 minutes.   POSSIBLE D/C IN am DAYS, DEPENDING ON CLINICAL CONDITION.   Ramonita LabGouru, Octavia Velador M.D on 06/12/2015 at 12:24 PM  Between 7am to 6pm - Pager - [phone] After 6pm go to [email or web address] - password [email or web address] [email or web address]  [facility]  Office  [phone]  CC: Primary care physician; No PCP Per Patient

## 2015-06-12 NOTE — Clinical Social Work Note (Signed)
Clinical Social Work Assessment  Patient Details  Name: [name] L Paugh MRN: [id number] Date of Birth: Jan 11, 1913  Date of referral:  06/09/15               Reason for consult:  Facility Placement                Permission sought to share information with:  Facility Medical sales representativeContact Representative, Family Supports Permission granted to share information::  Yes, Verbal Permission Granted  Name::     Fredrik CoveRoger Vaughn/Nephew/next of kin [phone]  Agency::  [facility]  Relationship::     Contact Information:     Housing/Transportation Living arrangements for the past 2 months:  Assisted Living Facility Source of Information:  Patient, Medical Team, Power of Attorney Patient Interpreter Needed:  None Criminal Activity/Legal Involvement Pertinent to Current Situation/Hospitalization:  No - Comment as needed Significant Relationships:  Church, Other Family Members, Friend Lives with:  Facility Resident Do you feel safe going back to the place where you live?  No Need for family participation in patient care:  Yes (Comment)  Care giving concerns:  Pt is a resident of [facility], she lives in the regular assisted living, not memory care unit.    Social Worker assessment / plan:  CSW was referred to Pt to assist with dc planning. 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 [name] is her POA. Pt's treatment team reports that she has been confused and has needed a sitter due to her AMS. Pt was arousing during CSW visit. Pt was able to participate in CSW assessment. She was not oriented to place at first, but after a few minutes she was able to recount that she was NPO until last night and other details of her caregiving while in the hospital. 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 [name] is POA, staff report that he was just in the room visiting but Pt was still sleeping at that time. CSW will follow up with POA to discuss dc planning. Pt will likely need PT for  evaluation before returning to ALF. Pt will be 81 years old in a month. Pt reports that she has been an avid sports fan her whole life and that is her favorite thing to watch on tv. Pt holds 2 masters, including a Scientist, water qualityMasters in Surveyor, quantitynglish and Masters in [employer]hering-Ploug[employer]igious Education.    Employment status:  Retired Health and safety inspectornsurance information:  Medicare PT Recommendations:  Not assessed at this time Information / Referral to community resources:     Patient/Family's Response to care:  Pt is receptive to CSW. She reports being in pain the night before, especially when she was moved in the bed. Pt states that she loves food, all food , so she is looking forward to being able to eat again.   Patient/Family's Understanding of and Emotional Response to Diagnosis, Current Treatment, and Prognosis:  Pt able to state what brought her to hospital and what is being examined by doctors at this time.   Emotional Assessment Appearance:  Appears younger than stated age Attitude/Demeanor/Rapport:   (cooperative, positive) Affect (typically observed):  Adaptable, Appropriate Orientation:  Oriented to Self, Oriented to Place Alcohol / Substance use:  Never Used Psych involvement (Current and /or in the community):  No (Comment)  Discharge Needs  Concerns to be addressed:  Adjustment to Illness, Discharge Planning Concerns Readmission within the last 30 days:  No Current discharge risk:  None Barriers to Discharge:  Continued Medical Work up   [employer], LCSW 06/12/2015, 9:45 PM

## 2015-06-12 NOTE — Consult Note (Signed)
Subjective: Patient seen for hematochezia. Patient sleeping more disoriented when aroused. No apparent abdominal pain. No bowel movement or repeat rectal bleeding over 36 hours.  Objective: Vital signs in last 24 hours: Temp:  [97.9 F (36.6 C)] 97.9 F (36.6 C) (10/16 1237) Pulse Rate:  [56-93] 90 (10/16 1237) Resp:  [16-18] 17 (10/16 1237) BP: (112-153)/(66-73) 153/71 mmHg (10/16 1237) SpO2:  [82 %-96 %] 94 % (10/16 1237) Blood pressure 153/71, pulse 90, temperature 97.9 F (36.6 C), temperature source Oral, resp. rate 17, height 5\' 3"  (1.6 m), weight 69.718 kg (153 lb 11.2 oz), SpO2 94 %.   Intake/Output from previous day: 10/15 0701 - 10/16 0700 In: 1136.5 [I.V.:1136.5] Out: 0   Intake/Output this shift: Total I/O In: 395 [I.V.:395] Out: 0    General appearance:  A 81 year old female no distress Resp:  Clear to auscultation Cardio:  Regular rate and rhythm GI:  Soft nontender nondistended bowel sounds positive normoactive Extremities:  No clubbing cyanosis or edema   Lab Results: Results for orders placed or performed during the hospital encounter of 06/09/15 (from the past 24 hour(s))  CBC     Status: Abnormal   Collection Time: 06/12/15  5:44 AM  Result Value Ref Range   WBC 9.4 3.6 - 11.0 K/uL   RBC 3.68 (L) 3.80 - 5.20 MIL/uL   Hemoglobin 11.6 (L) 12.0 - 16.0 g/dL   HCT 16.133.9 (L) 09.635.0 - 04.547.0 %   MCV 92.1 80.0 - 100.0 fL   MCH 31.6 26.0 - 34.0 pg   MCHC 34.3 32.0 - 36.0 g/dL   RDW 40.914.3 81.111.5 - 91.414.5 %   Platelets 252 150 - 440 K/uL      Recent Labs  06/10/15 0440  06/10/15 2341 06/11/15 0723 06/12/15 0544  WBC 7.8  --   --  14.0* 9.4  HGB 10.5*  < > 11.6* 11.6* 11.6*  HCT 31.2*  --  34.6* 35.0 33.9*  PLT 201  --   --  256 252  < > = values in this interval not displayed. BMET  Recent Labs  06/09/15 1554  NA 137  K 3.9  CL 101  CO2 28  GLUCOSE 133*  BUN 44*  CREATININE 0.98  CALCIUM 9.5   LFT  Recent Labs  06/09/15 1554  PROT 6.6   ALBUMIN 3.6  AST 21  ALT 13*  ALKPHOS 66  BILITOT 0.8   PT/INR  Recent Labs  06/09/15 1554  LABPROT 14.5  INR 1.11   Hepatitis Panel No results for input(s): HEPBSAG, HCVAB, HEPAIGM, HEPBIGM in the last 72 hours. C-Diff No results for input(s): CDIFFTOX in the last 72 hours. No results for input(s): CDIFFPCR in the last 72 hours.   Studies/Results: No results found.  Scheduled Inpatient Medications:   . amLODipine  5 mg Oral Daily  . chlorthalidone  25 mg Oral Daily  . cholecalciferol  2,000 Units Oral Daily  . hydrocortisone   Rectal TID  . Influenza vac split quadrivalent PF  0.5 mL Intramuscular Tomorrow-1000  . latanoprost  1 drop Both Eyes QHS  . multivitamin-lutein  1 capsule Oral Daily  . oxybutynin  10 mg Oral Daily  . pantoprazole (PROTONIX) IV  40 mg Intravenous Q12H  . polyvinyl alcohol  1 drop Both Eyes BID  . trandolapril  2 mg Oral Daily    Continuous Inpatient Infusions:   . sodium chloride 50 mL/hr at 06/12/15 0538    PRN Inpatient Medications:  acetaminophen **OR** acetaminophen, alum &  mag hydroxide-simeth, benzonatate, HYDROcodone-acetaminophen, HYDROcodone-homatropine, LORazepam, ondansetron **OR** ondansetron (ZOFRAN) IV  Miscellaneous:   Assessment:  1. Hematochezia likely related to internal hemorrhoids. Less likely diverticular in nature. Hemodynamically stable. No repeat bleeding for over 36 hours. Currently on treatment for the hemorrhoids. Hemogram stable  Plan:  1. Finish hemorrhoid treatment applied 3 times a day for 10 days. 2. Would start a daily dose of MiraLAX tomorrow. 3. Advance diet to full liquid 4. Two-way abdominal film in the morning 5. Discussed with Dr. Tanna Savoy MD 06/12/2015, 2:43 PM

## 2015-06-13 ENCOUNTER — Inpatient Hospital Stay: Payer: Medicare Other

## 2015-06-13 DIAGNOSIS — R338 Other retention of urine: Secondary | ICD-10-CM

## 2015-06-13 LAB — CBC
HCT: 31.4 % — ABNORMAL LOW (ref 35.0–47.0)
Hemoglobin: 10.6 g/dL — ABNORMAL LOW (ref 12.0–16.0)
MCH: 30.6 pg (ref 26.0–34.0)
MCHC: 33.7 g/dL (ref 32.0–36.0)
MCV: 91 fL (ref 80.0–100.0)
PLATELETS: 240 10*3/uL (ref 150–440)
RBC: 3.45 MIL/uL — AB (ref 3.80–5.20)
RDW: 14.1 % (ref 11.5–14.5)
WBC: 7.2 10*3/uL (ref 3.6–11.0)

## 2015-06-13 LAB — BASIC METABOLIC PANEL
Anion gap: 6 (ref 5–15)
BUN: 14 mg/dL (ref 6–20)
CHLORIDE: 111 mmol/L (ref 101–111)
CO2: 24 mmol/L (ref 22–32)
Calcium: 9.2 mg/dL (ref 8.9–10.3)
Creatinine, Ser: 0.62 mg/dL (ref 0.44–1.00)
GFR calc Af Amer: >60 mL/min (ref >60)
Glucose, Bld: 89 mg/dL (ref 65–99)
POTASSIUM: 3.3 mmol/L — AB (ref 3.5–5.1)
Sodium: 141 mmol/L (ref 135–145)

## 2015-06-13 MED ORDER — POTASSIUM CHLORIDE 20 MEQ PO PACK
40.0000 meq | PACK | Freq: Once | ORAL | Status: AC
  Administered 2015-06-13: 40 meq via ORAL
  Filled 2015-06-13: qty 2

## 2015-06-13 NOTE — Progress Notes (Signed)
[facility] [place] [facility]   PATIENT NAME: [name]    MR#:  [id number]  DATE OF BIRTH:  1913-05-04  SUBJECTIVE:  CHIEF COMPLAINT:  Patient is resting comfortably.  Retaining urine , bladder scan has revealed a 900 cc which is  new per caregivers. On soft diet Denies any abdominal pain. .She is very hard of hearing, but answering all questions appropriately  REVIEW OF SYSTEMS:  CONSTITUTIONAL: No fever, fatigue EYES: No blurred or double vision.  EARS, NOSE, AND THROAT: No tinnitus or ear pain.  RESPIRATORY: No cough, shortness of breath, wheezing or hemoptysis.  CARDIOVASCULAR: No chest pain, orthopnea, edema.  GASTROINTESTINAL: No nausea, vomiting, diarrhea or abdominal pain.  GENITOURINARY: No dysuria, hematuria.  ENDOCRINE: No polyuria, nocturia,  HEMATOLOGY: No anemia, easy bruising or bleeding SKIN: No rash or lesion. MUSCULOSKELETAL: No joint pain or arthritis.   NEUROLOGIC: No tingling, numbness, weakness.  PSYCHIATRY: No anxiety or depression.   DRUG ALLERGIES:   Allergies  Allergen Reactions  . Celebrex [Celecoxib]     VITALS:  Blood pressure 117/49, pulse 81, temperature 97.3 F (36.3 C), temperature source Oral, resp. rate 18, height 5\' 3"  (1.6 m), weight 69.718 kg (153 lb 11.2 oz), SpO2 95 %.  PHYSICAL EXAMINATION:  GENERAL:  81 y.o.-year-old patient lying in the bed with no acute distress.  EYES: Pupils equal, round, reactive to light and accommodation. No scleral icterus.  HEENT: Head atraumatic, normocephalic. Oropharynx and nasopharynx clear.  NECK:  Supple, no jugular venous distention. No thyroid enlargement, no tenderness.  LUNGS: Normal breath sounds bilaterally, no wheezing, rales,rhonchi or crepitation. No use of accessory muscles of respiration.  CARDIOVASCULAR: S1, S2 normal. No murmurs, rubs, or gallops.  ABDOMEN: Soft, nontender, nondistended. Bowel sounds present. No organomegaly or mass.  EXTREMITIES: No  pedal edema, cyanosis, or clubbing.  NEUROLOGIC: Cranial nerves II through XII are intact. Muscle strength at baseline . Sensation intact. Gait not checked.  PSYCHIATRIC: The patient is alert and oriented x 3.  SKIN: No obvious rash, lesion, or ulcer.    LABORATORY PANEL:   CBC  Recent Labs Lab 06/13/15 0630  WBC 7.2  HGB 10.6*  HCT 31.4*  PLT 240   ------------------------------------------------------------------------------------------------------------------  Chemistries   Recent Labs Lab 06/09/15 1554 06/13/15 0630  NA 137 141  K 3.9 3.3*  CL 101 111  CO2 28 24  GLUCOSE 133* 89  BUN 44* 14  CREATININE 0.98 0.62  CALCIUM 9.5 9.2  AST 21  --   ALT 13*  --   ALKPHOS 66  --   BILITOT 0.8  --    ------------------------------------------------------------------------------------------------------------------  Cardiac Enzymes No results for input(s): TROPONINI in the last 168 hours. ------------------------------------------------------------------------------------------------------------------  RADIOLOGY:  Dg Abd 2 Views  06/13/2015  CLINICAL DATA:  Subsequent encounter for several day history of rectal bleeding. EXAM: ABDOMEN - 2 VIEW COMPARISON:  None. FINDINGS: Upright film shows some probable atelectasis at the left lung base. No evidence for intraperitoneal free air. Supine film shows no gaseous bowel dilatation to suggest obstruction. Bones are diffusely demineralized. IMPRESSION: Negative. Electronically Signed   By: Kennith CenterEric  Mansell M.D.   On: 06/13/2015 08:03    EKG:   Orders placed or performed in visit on 06/09/15  . EKG 12-Lead  . EKG 12-Lead  . EKG 12-Lead    ASSESSMENT AND PLAN:   81 year old female with a history of hypertension who presents with bright red blood per rectum. 1. Bright red blood per rectum:  secondary to diverticular bleed, internal hemorrhoids   GI bleeding scan negative. Continue per rectal hydrocortisone cream for internal  hemorrhoids Hemoglobin11.6--> 10.6 , probably hemodilution, no other episodes of bleeding were noticed for RN's report   GI has recommended to discharge patient from his standpoint, tolerating advanced diet Patient is on Protonix.  NSAIDs will be discontinued for now.    2. Essential hypertension: Patient's blood pressure is acceptable   continue outpatient medications, including Norvasc and chlorthalidone.   3. Bilateral leg wounds POA: Continue wound care   4. Acute urinary retention: Currently patient is getting in and out catheter as needed basis Consult urology as this is a new problem as reported by caregivers  5. Hypokalemia replete and recheck in a.m.  6. Generalized weakness: PT eval  7. Intermittent episodes of agitation secondary to sundowning     All the records are reviewed and case discussed with Care Management/Social Workerr. Management plans discussed with the patient, family and they are in agreement.  CODE STATUS: DNR  TOTAL TIME TAKING CARE OF THIS PATIENT: 35 minutes.   POSSIBLE D/C IN am DAYS, DEPENDING ON CLINICAL CONDITION.   Ramonita Lab M.D on 06/13/2015 at 11:00 AM  Between 7am to 6pm - Pager - [phone] After 6pm go to [email or web address] - password [email or web address] [facility]  [place] [facility]  Office  [phone]  CC: Primary care physician; No PCP Per Patient

## 2015-06-13 NOTE — Evaluation (Signed)
Physical Therapy Evaluation Patient Details Name: [name] MRN: [id number] DOB: 1913-01-01 Today's Date: 06/13/2015   History of Present Illness  Pt arrived with complaints of bright red blood per rectum and hypokalemia. PMH: macular degeneraltion, osteoporosis, HTN, lumbar spinal stenosis, hyperlipidemia, and bilateral leg wounds. Pt is AOx4 at time of evaluation but requires some assistance with the year as she continues to repeat that it is "1916." History supplemented by friend who is present for evaluation. Friend reports 1 fall at facility in the last 12 months. Pt was previously independent with rolling walker for limited facility ambulation. She would independently ambulate at night from bed to bathroom. Patient is mildly agitated at time of evaluation  Clinical Impression  Pt demonstrates significant deconditioning and deviation from baseline mobility. Pt requires modA+1 for bed mobility and minA+1 for sit to stand. She is very unsteady in standing with LE buckling and posterior LOB. Pt was previously independent with mobility in room and for limited facility ambulation with rolling walker. Pt would benefit from SNF placement as currently she would benefit from daily physical therapy to regain strength/balance and return to prior level of function at facility. Pt is considering at this time but unwilling to make a decision regarding whether she would agree or not. Pt will benefit from skilled PT services to address deficits in strength, balance, and mobility in order to return to full function at home.     Follow Up Recommendations SNF (Mobility is grossly deviated from baseline, very weak)    Equipment Recommendations  None recommended by PT    Recommendations for Other Services       Precautions / Restrictions Precautions Precautions: Fall Restrictions Weight Bearing Restrictions: No      Mobility  Bed Mobility Overal bed mobility: Needs Assistance Bed Mobility: Sit to  Supine;Supine to Sit     Supine to sit: Mod assist Sit to supine: Mod assist   General bed mobility comments: Pt moves extremely slowly. She has difficutly with lateral weight shifiting to scoot forward in bed and requires heavy verbal and tactile cues  Transfers Overall transfer level: Needs assistance Equipment used: Rolling walker (2 wheeled) Transfers: Sit to/from Stand Sit to Stand: Min assist         General transfer comment: Heavy cues for proper hand placement and sequencing during both phases of transfer. Heavy cues for upright posture in standing with manual assist for hip extension. Cues for anterior weight shifting. Pt supports LE on bed and falls backwards continuously. Unable to self correct and has difficulty correcting with cues. LE buckling noted throughout. Sit to stand practed with patient x 2. 2 attempts made with pateint to attempt ambulation but unable  Ambulation/Gait             General Gait Details: Attempted forward stepping but pt with LE buckling and posterior LOB. Unable to ambulate at this time  Stairs            Wheelchair Mobility    Modified Rankin (Stroke Patients Only)       Balance Overall balance assessment: Needs assistance   Sitting balance-[name] Scale: Fair       Standing balance-[name] Scale: Poor                               Pertinent Vitals/Pain Pain Assessment: No/denies pain    Home Living Family/patient expects to be discharged to:: Assisted living  Home Equipment: Walker - 2 wheels      Prior Function Level of Independence: Needs assistance   Gait / Transfers Assistance Needed: Independent with rolling walker  ADL's / Homemaking Assistance Needed: Assist for bathing but independent with dressing        Hand Dominance        Extremity/Trunk Assessment   Upper Extremity Assessment: Generalized weakness (Pt only has 30-40 degrees of AROM shoulder flexion, chronic)            Lower Extremity Assessment: Generalized weakness (MinA+1 sit to stand, LE buckling, partial SLR with assist)         Communication   Communication: HOH  Cognition Arousal/Alertness: Awake/alert Behavior During Therapy: WFL for tasks assessed/performed;Agitated Overall Cognitive Status: History of cognitive impairments - at baseline (AOx4 at time of evaluation)                      General Comments      Exercises        Assessment/Plan    PT Assessment Patient needs continued PT services  PT Diagnosis Difficulty walking;Abnormality of gait;Generalized weakness   PT Problem List Decreased strength;Decreased activity tolerance;Decreased balance;Decreased range of motion;Decreased mobility;Decreased cognition;Decreased knowledge of use of DME;Decreased safety awareness  PT Treatment Interventions DME instruction;Gait training;Functional mobility training;Therapeutic activities;Therapeutic exercise;Balance training;Neuromuscular re-education;Cognitive remediation;Patient/family education   PT Goals (Current goals can be found in the Care Plan section) Acute Rehab PT Goals Patient Stated Goal: "I want to be able to walk, but I just can't do it right now." PT Goal Formulation: With patient Time For Goal Achievement: 06/27/15 Potential to Achieve Goals: Fair    Frequency Min 2X/week   Barriers to discharge        Co-evaluation               End of Session Equipment Utilized During Treatment: Gait belt Activity Tolerance: Patient tolerated treatment well Patient left: in bed;with call bell/phone within reach;with family/visitor present;with nursing/sitter in room [employer] requests no alarm currently) Nurse Communication: Other (comment) (CNA observed mobility with therapist)         Time: 4098-1191 PT Time Calculation (min) (ACUTE ONLY): 25 min   Charges:   PT Evaluation $Initial PT Evaluation Tier I: 1 Procedure PT Treatments $Therapeutic  Activity: 8-22 mins   PT G Codes:       [name] PT, DPT   [name] 06/13/2015, 4:49 PM

## 2015-06-13 NOTE — Progress Notes (Signed)
Plan is to return to [facility]. [facility] and ambulates well. Per Kendal HymenBonnie patient is [facility]. Per Kendal HymenBonnie they can accept patient back with a foley if needed. Clinical Social Worker (CSW) will continue to follow and assist as needed.   Jetta LoutBailey Morgan, LCSWA [phone]

## 2015-06-13 NOTE — Care Management Important Message (Signed)
Important Message  Patient Details  Name: Della Gooancy L Kittler MRN: [id number] Date of Birth: 1912/09/30   Medicare Important Message Given:  Yes-second notification given    Adonis HugueninBerkhead, Efe Fazzino L, RN 06/13/2015, 11:18 AM

## 2015-06-13 NOTE — Consult Note (Signed)
 @ENCDATE @ 1:16 PM   [name] Jan 24, 1913 [id number]  Referring provider: Dr. Amado CoeGouru  Chief Complaint  Patient presents with  . Rectal Bleeding    HPI: The patient is a 81 year old female who was admitted to the hospital with bright red blood per rectum. Urology was consulted for urinary retention. The patient is a poor historian however she denies any urinary problems. She currently voids into a diaper. At her nursing home, she is able to and ambulate and urinate on her own. She denies any urinary history.  During this admission, she has had bladder scans as high as 800 cc. She's had PVRs running from 300 to 400 cc.    PMH: Past Medical History  Diagnosis Date  . Macular degeneration   . Osteoporosis   . Hypertension   . Spinal stenosis of lumbar region   . Hyperlipidemia     Surgical History: Past Surgical History  Procedure Laterality Date  . Joint replacement Right     hip  . Abdominal hysterectomy      Home Medications:    Medication List    ASK your doctor about these medications        acetaminophen 325 MG tablet  Commonly known as:  TYLENOL  Take 650 mg by mouth every 6 (six) hours as needed.     amLODipine 5 MG tablet  Commonly known as:  NORVASC  Take 5 mg by mouth daily.     benzonatate 100 MG capsule  Commonly known as:  TESSALON  Take 100 mg by mouth 3 (three) times daily as needed for cough.     chlorthalidone 25 MG tablet  Commonly known as:  HYGROTON  Take 25 mg by mouth daily.     ENSURE PLUS Liqd  Take 237 mLs by mouth.     furosemide 20 MG tablet  Commonly known as:  LASIX  Take 20 mg by mouth.     HYDROcodone-acetaminophen 5-325 MG tablet  Commonly known as:  NORCO/VICODIN  Take 1 tablet by mouth every 6 (six) hours as needed for moderate pain.     HYDROcodone-homatropine 5-1.5 MG/5ML syrup  Commonly known as:  HYCODAN  Take 5 mLs by mouth every 6 (six) hours as needed for cough.     hydroxypropyl methylcellulose /  hypromellose 2.5 % ophthalmic solution  Commonly known as:  ISOPTO TEARS / GONIOVISC  Place 1 drop into both eyes 2 (two) times daily.     latanoprost 0.005 % ophthalmic solution  Commonly known as:  XALATAN  Place 1 drop into both eyes at bedtime.     lidocaine 5 %  Commonly known as:  LIDODERM  Place 1 patch onto the skin daily. Remove & Discard patch within 12 hours or as directed by MD     moexipril 15 MG tablet  Commonly known as:  UNIVASC  Take 15 mg by mouth daily.     naproxen 250 MG tablet  Commonly known as:  NAPROSYN  Take 250 mg by mouth 3 (three) times daily with meals.     omeprazole 20 MG capsule  Commonly known as:  PRILOSEC  Take 20 mg by mouth daily.     oxybutynin 10 MG 24 hr tablet  Commonly known as:  DITROPAN-XL  Take 10 mg by mouth daily.     polyethylene glycol packet  Commonly known as:  MIRALAX / GLYCOLAX  Take 17 g by mouth every other day.     PRESERVISION AREDS 2 Caps  Take 1 capsule by mouth daily.     SYSTANE 0.4-0.3 % Soln  Generic drug:  Polyethyl Glycol-Propyl Glycol  Place 1 drop into both eyes as needed.     triamcinolone cream 0.1 %  Commonly known as:  KENALOG  Apply 1 application topically 2 (two) times daily. To face     TUCKS HEMORRHOIDAL 1-12.5 % rectal ointment  Generic drug:  pramoxine-mineral oil-zinc  Place 1 application rectally 2 (two) times daily as needed for itching.     Vitamin D3 2000 UNITS Tabs  Take 1 capsule by mouth daily.        Allergies:  Allergies  Allergen Reactions  . Celebrex [Celecoxib]     Family History: No family history on file.  Social History:  reports that she has never smoked. She does not have any smokeless tobacco history on file. She reports that she does not drink alcohol. Her drug history is not on file.  ROS:  12 point ROS reviewed in chart and agreed upon.  Negative except for HPI.                                         Physical Exam: BP 117/49  mmHg  Pulse 81  Temp(Src) 97.3 F (36.3 C) (Oral)  Resp 18  Ht  (1.6 m)  Wt 153 lb 11.2 oz (69.718 kg)  BMI 27.23 kg/m2  SpO2 95%  Constitutional:  Alert and oriented, No acute distress. HEENT: [place] AT, moist mucus membranes.  Trachea midline, no masses. Cardiovascular: No clubbing, cyanosis, or edema. Respiratory: Normal respiratory effort, no increased work of breathing. GI: Abdomen is soft, nontender, nondistended, no abdominal masses GU: No CVA tenderness. No foley Skin: No rashes, bruises or suspicious lesions. Lymph: No cervical or inguinal adenopathy. Neurologic: Grossly intact, no focal deficits, moving all 4 extremities. Psychiatric: Normal mood and affect.  Laboratory Data: Lab Results  Component Value Date   WBC 7.2 06/13/2015   HGB 10.6* 06/13/2015   HCT 31.4* 06/13/2015   MCV 91.0 06/13/2015   PLT 240 06/13/2015    Lab Results  Component Value Date   CREATININE 0.62 06/13/2015    No results found for: PSA  No results found for: TESTOSTERONE  No results found for: HGBA1C  Urinalysis    Component Value Date/Time   COLORURINE YELLOW* 06/09/2015 1830   COLORURINE Yellow 07/13/2012 1100   APPEARANCEUR CLOUDY* 06/09/2015 1830   APPEARANCEUR Clear 07/13/2012 1100   LABSPEC 1.013 06/09/2015 1830   LABSPEC 1.016 07/13/2012 1100   PHURINE 5.0 06/09/2015 1830   PHURINE 7.0 07/13/2012 1100   GLUCOSEU NEGATIVE 06/09/2015 1830   GLUCOSEU Negative 07/13/2012 1100   HGBUR NEGATIVE 06/09/2015 1830   HGBUR Negative 07/13/2012 1100   BILIRUBINUR NEGATIVE 06/09/2015 1830   BILIRUBINUR Negative 07/13/2012 1100   KETONESUR NEGATIVE 06/09/2015 1830   KETONESUR Negative 07/13/2012 1100   PROTEINUR NEGATIVE 06/09/2015 1830   PROTEINUR Negative 07/13/2012 1100   NITRITE POSITIVE* 06/09/2015 1830   NITRITE Negative 07/13/2012 1100   LEUKOCYTESUR 1+* 06/09/2015 1830   LEUKOCYTESUR Trace 07/13/2012 1100    Pertinent Imaging: PVR: 450 cc  Assessment &  Plan:  1. Urinary retention -Place foley catheter -Check urine culture -Avoid anticholinergics and narcotics as tolerated -discharge home with foley -follow up as outpatient for trial of void  2. Overflow incontinence -As above   Hildred Laser, MD  [facility] [address], [place], [place] [address] [phone]

## 2015-06-13 NOTE — Consult Note (Signed)
WOC wound follow up Wound type:Chronic venous insufficiency.  Wears [employer] boot, changed twice weekly. Monday and Thursday.   Measurement:Left pretibial leg with 2 nonintact lesions.  Distal 1 cm x 0.5 cm x 0.1 cm Proximal 0.5 cm x 0.5 cm x 0.1 cm  Right anterior calf 3 lesions 0.5 cm in diameter below knee, proximal 1 cm x 0.5 cm x 0.1 cm distal 0.5 cm x 0.3 cm x 0.1 cm Wound bed:100% pink and moist Drainage (amount, consistency, odor) Minimal serosanguinous drainage  No odor.  Periwound:Intact Dressing procedure/placement/frequency:Cleanse legs with soap and water.  Mepitel silicone contact layer to open wounds,  Cover with ABD pad.  Wrap from just below toes to just below knee with Kerlix, then Coban.  Place stocking over Coban to hold in place, per patient. Change Monday and Thursday.  Will not follow at this time.  Please re-consult if needed.  Maple HudsonKaren Agatha Duplechain RN BSN CWON Pager [phone]

## 2015-06-14 ENCOUNTER — Telehealth: Payer: Self-pay

## 2015-06-14 ENCOUNTER — Inpatient Hospital Stay: Payer: Medicare Other

## 2015-06-14 LAB — CBC
HCT: 30.8 % — ABNORMAL LOW (ref 35.0–47.0)
Hemoglobin: 10.6 g/dL — ABNORMAL LOW (ref 12.0–16.0)
MCH: 30.8 pg (ref 26.0–34.0)
MCHC: 34.2 g/dL (ref 32.0–36.0)
MCV: 90 fL (ref 80.0–100.0)
PLATELETS: 254 10*3/uL (ref 150–440)
RBC: 3.43 MIL/uL — AB (ref 3.80–5.20)
RDW: 14 % (ref 11.5–14.5)
WBC: 8.6 10*3/uL (ref 3.6–11.0)

## 2015-06-14 LAB — BASIC METABOLIC PANEL
Anion gap: 6 (ref 5–15)
BUN: 12 mg/dL (ref 6–20)
CHLORIDE: 111 mmol/L (ref 101–111)
CO2: 23 mmol/L (ref 22–32)
CREATININE: 0.58 mg/dL (ref 0.44–1.00)
Calcium: 9.1 mg/dL (ref 8.9–10.3)
GFR calc Af Amer: >60 mL/min (ref >60)
GFR calc non Af Amer: >60 mL/min (ref >60)
GLUCOSE: 101 mg/dL — AB (ref 65–99)
Potassium: 3.4 mmol/L — ABNORMAL LOW (ref 3.5–5.1)
Sodium: 140 mmol/L (ref 135–145)

## 2015-06-14 LAB — URINALYSIS COMPLETE WITH MICROSCOPIC (ARMC ONLY)
Bilirubin Urine: NEGATIVE
GLUCOSE, UA: NEGATIVE mg/dL
Ketones, ur: NEGATIVE mg/dL
Nitrite: NEGATIVE
Protein, ur: NEGATIVE mg/dL
Specific Gravity, Urine: 1.009 (ref 1.005–1.030)
pH: 5 (ref 5.0–8.0)

## 2015-06-14 MED ORDER — LORAZEPAM 2 MG/ML IJ SOLN
1.0000 mg | Freq: Four times a day (QID) | INTRAMUSCULAR | Status: DC | PRN
  Administered 2015-06-14 – 2015-06-15 (×2): 1 mg via INTRAVENOUS
  Filled 2015-06-14 (×2): qty 1

## 2015-06-14 MED ORDER — LORAZEPAM 0.5 MG PO TABS: 0.5000 mg | ORAL_TABLET | Freq: Two times a day (BID) | ORAL | Status: DC

## 2015-06-14 MED ORDER — DEXTROSE-NACL 5-0.9 % IV SOLN
INTRAVENOUS | Status: DC
  Administered 2015-06-14 – 2015-06-16 (×5): via INTRAVENOUS

## 2015-06-14 MED ORDER — DEXTROSE 5 % IV SOLN
1.0000 g | INTRAVENOUS | Status: DC
  Administered 2015-06-14 – 2015-06-15 (×2): 1 g via INTRAVENOUS
  Filled 2015-06-14 (×3): qty 10

## 2015-06-14 MED ORDER — POTASSIUM CHLORIDE 20 MEQ PO PACK: 40.0000 meq | PACK | Freq: Once | ORAL | Status: DC

## 2015-06-14 MED ORDER — LORAZEPAM 2 MG/ML IJ SOLN
0.5000 mg | Freq: Once | INTRAMUSCULAR | Status: AC
Start: 2015-06-14 — End: 2015-06-14
  Administered 2015-06-14: 0.5 mg via INTRAVENOUS

## 2015-06-14 NOTE — Progress Notes (Signed)
06/14/2015 5:59 PM  Pt suffering new onset agitation; disoriented to time, place, and situation.  Combative and yelling for help.  Requires 24 hour safety sitter.  MD notified and orders given/followed.  Pt NPO due to confusion and aspiration risk.  Will continue to monitor and assess.  Bradly Chrisougherty, Akia Desroches E, RN

## 2015-06-14 NOTE — Care Management (Signed)
Patient more confused and combative today.  Spoke with nursing and also Dr Amado CoeGouru.   MD had given orders for sedative this morning.  Discussed my concerns about patient not being at baseline and that patient came from assisted living prior and now unable to work with PT due to confusion. Dr Amado CoeGouru stated will run more tests today.

## 2015-06-14 NOTE — Progress Notes (Signed)
[facility] patient can't return unless she can walk. Clinical Social Worker (CSW) contacted patient's nephew Fredrik CoveRoger to discuss D/C plan. Nephew was visiting patient this morning and reported that he has never seen patient like this. CSW explained that PT is recommending SNF. Fredrik CoveRoger reported that he wants patient to return to [facility] because that is what she wants to do. Nephew is open to rehab as a last resort. CSW attempted to meet with patient however she was confused and had a sitter at bedside. CSW will continue to follow and assist as needed.   Jetta LoutBailey Morgan, LCSWA [phone]

## 2015-06-14 NOTE — Progress Notes (Signed)
PT Cancellation Note  Patient Details Name: [name] MRN: [id number] DOB: 22-Aug-1913   Cancelled Treatment:    Reason Eval/Treat Not Completed: Other (comment) (See PT note for further details ) PT attempted this morning. Upon entry into room nursing was present with pt being non-cooperative and trying to get out of bed to "go home". Will attempt therapy at later time/date when more appropriate.   Benna DunksCasey Markiyah Gahm 06/14/2015, 11:19 AM  Benna Dunksasey Vita Currin, SPT. [phone]

## 2015-06-14 NOTE — Telephone Encounter (Signed)
-----   Message from Hildred LaserBrian James Budzyn, MD sent at 06/13/2015  1:25 PM EDT ----- [name] will need to f/u for trial of void in 2-3 weeks as outpatient.  She is currently in the hospital

## 2015-06-14 NOTE — Progress Notes (Signed)
[facility] - [place] at [facility]   PATIENT NAME: [name]    MR#:  [id number]  DATE OF BIRTH:  [birth year]  SUBJECTIVE:  CHIEF COMPLAINT:  Patient is confused and very.She is very hard of hearing, but answering all questions appropriately  REVIEW OF SYSTEMS:  Review of systems are unobtainable DRUG ALLERGIES:   Allergies  Allergen Reactions  . Celebrex [Celecoxib]     VITALS:  Blood pressure 139/63, pulse 79, temperature 97.9 F (36.6 C), temperature source Oral, resp. rate 18, height 5\' 3"  (1.6 m), weight 69.718 kg (153 lb 11.2 oz), SpO2 96 %.  PHYSICAL EXAMINATION:  GENERAL:  81 y.o.-year-old patient lying in the bed with no acute distress.  EYES: Pupils equal, round, reactive to light and accommodation. No scleral icterus.  HEENT: Head atraumatic, normocephalic. Oropharynx and nasopharynx clear.  NECK:  Supple, no jugular venous distention. No thyroid enlargement, no tenderness.  LUNGS: Normal breath sounds bilaterally, no wheezing, rales,rhonchi or crepitation. No use of accessory muscles of respiration.  CARDIOVASCULAR: S1, S2 normal. No murmurs, rubs, or gallops.  ABDOMEN: Soft, nontender, nondistended. Bowel sounds present. No organomegaly or mass.  EXTREMITIES: No pedal edema, cyanosis, or clubbing.  NEUROLOGIC: Patient is lethargic as she just received Ativan PSYCHIATRIC: The patient is  lethargic SKIN: No obvious rash, lesion, bilateral leg ulcers with clean dressing   LABORATORY PANEL:   CBC  Recent Labs Lab 06/14/15 0413  WBC 8.6  HGB 10.6*  HCT 30.8*  PLT 254   ------------------------------------------------------------------------------------------------------------------  Chemistries   Recent Labs Lab 06/09/15 1554  06/14/15 0413  NA 137  < > 140  K 3.9  < > 3.4*  CL 101  < > 111  CO2 28  < > 23  GLUCOSE 133*  < > 101*  BUN 44*  < > 12  CREATININE 0.98  < > 0.58  CALCIUM 9.5  < > 9.1  AST 21  --   --    ALT 13*  --   --   ALKPHOS 66  --   --   BILITOT 0.8  --   --   < > = values in this interval not displayed. ------------------------------------------------------------------------------------------------------------------  Cardiac Enzymes No results for input(s): TROPONINI in the last 168 hours. ------------------------------------------------------------------------------------------------------------------  RADIOLOGY:  Dg Abd 2 Views  06/13/2015  CLINICAL DATA:  Subsequent encounter for several day history of rectal bleeding. EXAM: ABDOMEN - 2 VIEW COMPARISON:  None. FINDINGS: Upright film shows some probable atelectasis at the left lung base. No evidence for intraperitoneal free air. Supine film shows no gaseous bowel dilatation to suggest obstruction. Bones are diffusely demineralized. IMPRESSION: Negative. Electronically Signed   By: Kennith CenterEric  Mansell M.D.   On: 06/13/2015 08:03    EKG:   Orders placed or performed in visit on 06/09/15  . EKG 12-Lead  . EKG 12-Lead  . EKG 12-Lead    ASSESSMENT AND PLAN:   81 year old female with a history of hypertension who presents with bright red blood per rectum.  #Altered mental status secondary to acute cystitis UA is abnormal Will get urine culture and sensitivity Patient is started on  IV antibiotic Rocephin empirically Blood cultures 2) to the antibiotic We will also get chest x-ray to rule out pneumonia Nothing by mouth while she is altered  # Bright red blood per rectum: secondary to diverticular bleed, internal hemorrhoids   GI bleeding scan negative. Continue per rectal hydrocortisone cream for internal hemorrhoids Hemoglobin11.6--> 10.6 -->10.6, probably hemodilution,  no other episodes of bleeding were noticed for RN's report   GI has recommended to discharge patient from his standpoint Patient is on Protonix.  NSAIDs will be discontinued for now.    2. Essential hypertension: Patient's blood pressure is acceptable    continue outpatient medications, including Norvasc and chlorthalidone once patient is more awake and alert   3. Bilateral leg wounds POA: Continue wound care   4. Acute urinary retention: Secondary to overflow incontinence Urology has recommended to discharge with Foley catheter and outpatient follow-up Avoid anticholinergics and narcotics as tolerated  5. Hypokalemia replete and recheck in a.m.  6. Generalized weakness: PT eval-recommending skilled nursing care       All the records are reviewed and case discussed with Care Management/Social Workerr. Marland Kitchen  CODE STATUS: DNR  TOTAL TIME TAKING CARE OF THIS PATIENT: 35 minutes.   POSSIBLE D/C IN 2-3 DAYS, DEPENDING ON CLINICAL CONDITION.   Ramonita Lab M.D on 06/14/2015 at 1:36 PM  Between 7am to 6pm - Pager - [phone] After 6pm go to [email or web address] - password [email or web address] [facility]  [facility] [place]  Office  [phone]  CC: Primary care physician; No PCP Per Patient

## 2015-06-14 NOTE — Progress Notes (Deleted)
PT Cancellation Note  Patient Details Name: Della Gooancy L Dantonio MRN: [id number] DOB: [birth year]   Cancelled Treatment:    Reason Eval/Treat Not Completed: Other (comment) (See PT note for further details ) PT attempted this morning. Upon entry into room nursing was present with pt being non-cooperative and trying to get out of bed to "go home". Will attempt therapy at later time/date when more appropriate.    Benna DunksCasey Hatsue Sime 06/14/2015, 11:15 AM  Benna Dunksasey Burnard Enis, SPT. [phone]

## 2015-06-14 NOTE — Progress Notes (Signed)
06/14/2015  Dr. Amado CoeGouru notified of severe confusion, agitation, yelling, biting, and hitting staff members after giving patient 0.5 mg Lorazepam PRN with no improvement. CT, chest xray, blood cultures, urinalysis, one time dose 0.5 mg Lorazepam, and NPO orders to be entered by MD. Will continue to monitor.  Ron ParkerHerron, Alquan Morrish D, RN

## 2015-06-15 LAB — BASIC METABOLIC PANEL
Anion gap: 8 (ref 5–15)
BUN: 8 mg/dL (ref 6–20)
CHLORIDE: 109 mmol/L (ref 101–111)
CO2: 24 mmol/L (ref 22–32)
CREATININE: 0.51 mg/dL (ref 0.44–1.00)
Calcium: 9 mg/dL (ref 8.9–10.3)
GFR calc Af Amer: >60 mL/min (ref >60)
Glucose, Bld: 126 mg/dL — ABNORMAL HIGH (ref 65–99)
Potassium: 3.1 mmol/L — ABNORMAL LOW (ref 3.5–5.1)
SODIUM: 141 mmol/L (ref 135–145)

## 2015-06-15 LAB — CBC
HCT: 31 % — ABNORMAL LOW (ref 35.0–47.0)
Hemoglobin: 11 g/dL — ABNORMAL LOW (ref 12.0–16.0)
MCH: 32 pg (ref 26.0–34.0)
MCHC: 35.4 g/dL (ref 32.0–36.0)
MCV: 90.5 fL (ref 80.0–100.0)
PLATELETS: 263 10*3/uL (ref 150–440)
RBC: 3.43 MIL/uL — ABNORMAL LOW (ref 3.80–5.20)
RDW: 14.1 % (ref 11.5–14.5)
WBC: 9.1 10*3/uL (ref 3.6–11.0)

## 2015-06-15 MED ORDER — MORPHINE SULFATE (PF) 2 MG/ML IV SOLN
1.0000 mg | Freq: Once | INTRAVENOUS | Status: AC
  Administered 2015-06-15: 1 mg via INTRAVENOUS
  Filled 2015-06-15: qty 1

## 2015-06-15 MED ORDER — PHENOL 1.4 % MT LIQD
1.0000 | OROMUCOSAL | Status: DC | PRN
  Administered 2015-06-15: 1 via OROMUCOSAL
  Filled 2015-06-15: qty 177

## 2015-06-15 MED ORDER — HALOPERIDOL LACTATE 5 MG/ML IJ SOLN: 5.0000 mg | Freq: Once | INTRAMUSCULAR | Status: DC

## 2015-06-15 MED ORDER — POTASSIUM CHLORIDE 10 MEQ/100ML IV SOLN
10.0000 meq | INTRAVENOUS | Status: AC
  Administered 2015-06-15 (×4): 10 meq via INTRAVENOUS
  Filled 2015-06-15 (×4): qty 100

## 2015-06-15 NOTE — Care Management Important Message (Signed)
Important Message  Patient Details  Name: [name] MRN: [id number] Date of Birth: [birth year]   Medicare Important Message Given:  Yes-third notification given    Olegario MessierKathy A Allmond 06/15/2015, 2:05 PM

## 2015-06-15 NOTE — Progress Notes (Signed)
[facility] - [place] at [facility]   PATIENT NAME: [name]    MR#:  [id number]  DATE OF BIRTH:  [birth year]  SUBJECTIVE:  CHIEF COMPLAINT:  Patient is confused and very.She is very hard of hearing,   REVIEW OF SYSTEMS:  Review of systems are unobtainable DRUG ALLERGIES:   Allergies  Allergen Reactions  . Celebrex [Celecoxib]     VITALS:  Blood pressure 147/60, pulse 87, temperature 97.5 F (36.4 C), temperature source Oral, resp. rate 18, height  (1.6 m), weight 69.718 kg (153 lb 11.2 oz), SpO2 96 %.  PHYSICAL EXAMINATION:  GENERAL:  81 y.o.-year-old patient lying in the bed with no acute distress.  EYES: Pupils equal, round, reactive to light and accommodation. No scleral icterus.  HEENT: Head atraumatic, normocephalic. Oropharynx and nasopharynx clear.  NECK:  Supple, no jugular venous distention. No thyroid enlargement, no tenderness.  LUNGS: Normal breath sounds bilaterally, no wheezing, rales,rhonchi or crepitation. No use of accessory muscles of respiration.  CARDIOVASCULAR: S1, S2 normal. No murmurs, rubs, or gallops.  ABDOMEN: Soft, nontender, nondistended. Bowel sounds present. No organomegaly or mass.  EXTREMITIES: No pedal edema, cyanosis, or clubbing.  NEUROLOGIC: He confused ,unable to do neurological exam. PSYCHIATRIC: The patient is  lethargic SKIN: No obvious rash, lesion, bilateral leg ulcers with clean dressing   LABORATORY PANEL:   CBC  Recent Labs Lab 06/15/15 0536  WBC 9.1  HGB 11.0*  HCT 31.0*  PLT 263   ------------------------------------------------------------------------------------------------------------------  Chemistries   Recent Labs Lab 06/09/15 1554  06/15/15 0536  NA 137  < > 141  K 3.9  < > 3.1*  CL 101  < > 109  CO2 28  < > 24  GLUCOSE 133*  < > 126*  BUN 44*  < > 8  CREATININE 0.98  < > 0.51  CALCIUM 9.5  < > 9.0  AST 21  --   --   ALT 13*  --   --   ALKPHOS 66  --   --    BILITOT 0.8  --   --   < > = values in this interval not displayed. ------------------------------------------------------------------------------------------------------------------  Cardiac Enzymes No results for input(s): TROPONINI in the last 168 hours. ------------------------------------------------------------------------------------------------------------------  RADIOLOGY:  Dg Chest Port 1 View  06/14/2015  CLINICAL DATA:  Cough. EXAM: PORTABLE CHEST 1 VIEW COMPARISON:  June 03, 2012. FINDINGS: The heart size and mediastinal contours are within normal limits. No pneumothorax or pleural effusion is noted. Mild bibasilar subsegmental atelectasis is noted. Mild degenerative changes seen involving both glenohumeral joints. IMPRESSION: Mild bibasilar subsegmental atelectasis. Electronically Signed   By: Lupita Raider, M.D.   On: 06/14/2015 14:08    EKG:   Orders placed or performed in visit on 06/09/15  . EKG 12-Lead  . EKG 12-Lead  . EKG 12-Lead    ASSESSMENT AND PLAN:   81 year old female with a history of hypertension who presents with bright red blood per rectum.  #Altered mental status secondary to acute cystitis UA is abnormal Will get urine culture and sensitivity Patient is started on  IV antibiotic Rocephin empirically Blood cultures 2) to the antibiotic We will also get chest x-ray to rule out pneumonia Nothing by mouth while she is altered  # Bright red blood per rectum: secondary to diverticular bleed, internal hemorrhoids   GI bleeding scan negative. Continue per rectal hydrocortisone cream for internal hemorrhoids Hemoglobin11.6--> 10.6 -->10.6, probably hemodilution, no other episodes of bleeding were  noticed for RN's report   GI has recommended to discharge patient from his standpoint Patient is on Protonix.  NSAIDs will be discontinued for now.    2. Essential hypertension: Patient's blood pressure is acceptable   Nothing by mouth so hold  Norvasc, chlorthalidone at this time. 3. Bilateral leg wounds POA: Continue wound care   4. Acute urinary retention: Secondary to overflow incontinence Urology has recommended to discharge with Foley catheter and outpatient follow-up Avoid anticholinergics and narcotics as tolerated  UTI: Gram-negative rods in the urine following final sensitivity results. Continue Rocephin, follow urine culture results.  5. Hypokalemia replete and recheck in a.m.  6. Generalized weakness: PT eval-recommending skilled nursing care  7. Confusion. Worsening due to baseline dementia: Speech therapy evaluation today, continue IV fluids as she is NPO.   All the records are reviewed and case discussed with Care Management/Social Workerr. Marland Kitchen.  CODE STATUS: DNR  TOTAL TIME TAKING CARE OF THIS PATIENT: 35 minutes.   POSSIBLE D/C IN 2-3 DAYS, DEPENDING ON CLINICAL CONDITION.   Katha HammingKONIDENA,Abdur Hoglund M.D on 06/15/2015 at 1:04 PM  Between 7am to 6pm - Pager - [phone] After 6pm go to [email or web address] - password [email or web address]  [facility]  Office  [phone]  CC: Primary care physician; No PCP Per Patient

## 2015-06-15 NOTE — Progress Notes (Deleted)
PT Cancellation Note  Patient Details Name: Della Gooancy L Thum MRN: [id number] DOB: 06/30/1913   Cancelled Treatment:    Reason Eval/Treat Not Completed: Other (comment) (See PT note for further details) PT attempted this afternoon. Pt less agitated this afternoon, however still very lethargic and refusing therapy secondary to fatigue. Per nursing, pt still struggling with simple commands, and agrees that an attempt tomorrow may be more appropriate.    Benna DunksCasey Sakai Heinle 06/15/2015, 3:31 PM  Benna Dunksasey Janya Eveland, SPT. [phone]

## 2015-06-15 NOTE — Progress Notes (Deleted)
PT Cancellation Note  Patient Details Name: Della Gooancy L Stash MRN: [id number] DOB: 1913-02-28   Cancelled Treatment:    Reason Eval/Treat Not Completed: Other (comment) (See PT note for further details)    Benna DunksCasey Zahra Peffley 06/15/2015, 3:35 PM  Benna Dunksasey Emilygrace Grothe, SPT. [phone]

## 2015-06-15 NOTE — Progress Notes (Signed)
Nutrition Follow-up      INTERVENTION:  Coordination of care: Await poc. Pt currently not safe to take po intake secondary to confusion and aspiration risk.   NUTRITION DIAGNOSIS:   Inadequate oral intake related to altered GI function as evidenced by NPO status.    GOAL:   Patient will meet greater than or equal to 90% of their needs  Not meeting nutritional needs  MONITOR:    (Energy intake, Digestive system)  REASON FOR ASSESSMENT:   Malnutrition Screening Tool    ASSESSMENT:   Pt with agitation and confusion.    Current Nutrition: NPO, limited intake during admission day 6. Per I and O sheet, bites, 20%, 0%, 90%   Gastrointestinal Profile: abdomen soft, obese, hypoactive Last BM: 10/14   Scheduled Medications:  . amLODipine  5 mg Oral Daily  . cefTRIAXone (ROCEPHIN)  IV  1 g Intravenous Q24H  . chlorthalidone  25 mg Oral Daily  . cholecalciferol  2,000 Units Oral Daily  . haloperidol lactate  5 mg Intravenous Once  . hydrocortisone   Rectal TID  . Influenza vac split quadrivalent PF  0.5 mL Intramuscular Tomorrow-1000  . latanoprost  1 drop Both Eyes QHS  . multivitamin-lutein  1 capsule Oral Daily  . oxybutynin  10 mg Oral Daily  . pantoprazole (PROTONIX) IV  40 mg Intravenous Q12H  . polyvinyl alcohol  1 drop Both Eyes BID  . potassium chloride  40 mEq Oral Once  . trandolapril  2 mg Oral Daily    Continuous Medications:  . dextrose 5 % and 0.9% NaCl 75 mL/hr at 06/15/15 0454     Electrolyte/Renal Profile and Glucose Profile:   Recent Labs Lab 06/13/15 0630 06/14/15 0413 06/15/15 0536  NA 141 140 141  K 3.3* 3.4* 3.1*  CL 111 111 109  CO2 24 23 24   BUN 14 12 8   CREATININE 0.62 0.58 0.51  CALCIUM 9.2 9.1 9.0  GLUCOSE 89 101* 126*   Protein Profile:  Recent Labs Lab 06/09/15 1554  ALBUMIN 3.6     Unable to complete Nutrition-Focused physical exam at this time.     Weight Trend since Admission: Filed Weights   06/09/15  1537 06/09/15 1959  Weight: 156 lb 8.4 oz (71 kg) 153 lb 11.2 oz (69.718 kg)      Diet Order:  Diet NPO time specified  Skin:   reviewed   Height:   Ht Readings from Last 1 Encounters:  06/09/15 5\' 3"  (1.6 m)    Weight:   Wt Readings from Last 1 Encounters:  06/09/15 153 lb 11.2 oz (69.718 kg)    BMI:  Body mass index is 27.23 kg/(m^2).  Estimated Nutritional Needs:   Kcal:  Using IBW of 52kg BEE 864 kcals (IF 1.0-1.3, AF 1.2) [id number] kcals/d  Protein:  Using IBW of 52kg (1.0-1.2 g/kg) 52-62 g/d  Fluid:  Using IBW of 52kg (30-6235ml/kg) 1560-183220ml/d  EDUCATION NEEDS:   No education needs identified at this time  HIGH Care Level  Rachelann Enloe B. Freida BusmanAllen, RD, LDN [phone] (pager)

## 2015-06-15 NOTE — Progress Notes (Signed)
PT Cancellation Note  Patient Details Name: [name] MRN: [id number] DOB: [birth year]   Cancelled Treatment:    Reason Eval/Treat Not Completed: Other (comment) (See PT note for further details) PT attempted this afternoon. Pt less agitated this afternoon, however still very lethargic and refusing therapy secondary to fatigue. Per nursing, pt still struggling with simple commands, and agrees that an attempt tomorrow may be more appropriate.    Benna DunksCasey Khadeem Rockett 06/15/2015, 3:36 PM Benna Dunksasey Elecia Serafin, SPT. [phone]

## 2015-06-15 NOTE — Progress Notes (Signed)
Pt rested in bed today. IV pain medication given once this shift for complaint of pain. Pt would not let therapy work with her this afternoon.

## 2015-06-16 ENCOUNTER — Ambulatory Visit: Payer: Medicare Other | Admitting: Surgery

## 2015-06-16 MED ORDER — SODIUM CHLORIDE 0.9 % IV SOLN
500.0000 mg | INTRAVENOUS | Status: DC
  Administered 2015-06-16: 0.5 g via INTRAVENOUS
  Filled 2015-06-16 (×3): qty 0.5

## 2015-06-16 NOTE — Evaluation (Signed)
Clinical/Bedside Swallow Evaluation Patient Details  Name: [name] MRN: [id number] Date of Birth: 09-15-1912  Today's Date: 06/16/2015 Time: SLP Start Time (ACUTE ONLY): 0915 SLP Stop Time (ACUTE ONLY): 1015 SLP Time Calculation (min) (ACUTE ONLY): 60 min  Past Medical History:  Past Medical History  Diagnosis Date  . Macular degeneration   . Osteoporosis   . Hypertension   . Spinal stenosis of lumbar region   . Hyperlipidemia    Past Surgical History:  Past Surgical History  Procedure Laterality Date  . Joint replacement Right     hip  . Abdominal hysterectomy     HPI:  Pt is a 81 y.o. female with a known history of Hearing Loss, essential hypertension, macular degeneration, osteoporosis and spinal stenosis who presents w/ bloody BMs. Patient reports approximately 2-3 weeks ago she noticed some blood when she wiped. However today she had 3 large bowel movements with bright red blood. She denies any abdominal pain associated with this. She was brought to the ER for further evaluation.   Assessment / Plan / Recommendation Clinical Impression  Pt appears to present w/ pharyngeal phase dysphagia of thin liquids despite aspiration precautions evidenced by delayed cough and audible, multiple swallows w/ trials. When given trials of Nectar consistency liquid, no immediate s/s of dysphagia/aspriation noted. Pt consumed trials w/out decline in respiratory status; clear vocal quality noted b/t trials. No oral phase defiicts were noted w/ trials given though pt only accepted a few trials total stating "that's enough right now". Pt assisted in feeding self but appears weak and required moderate assistance. Pt does appear at increased risk for aspiration of thin liquids and complications of such d/t a more sedentary status at this time; unsure of pt's baseline Cognitive status as well and desire to eat/drink. NSG consulted re: above and precautions. ST will f/u.     Aspiration Risk  Mild     Diet Recommendation Dysphagia 2 (Fine chop);Nectar   Medication Administration: Whole meds with puree (crush if nec/able) Compensations: Minimize environmental distractions;Slow rate;Small sips/bites    Other  Recommendations Recommended Consults:  (Dietician consult) Oral Care Recommendations: Oral care BID;Staff/trained caregiver to provide oral care   Follow Up Recommendations       Frequency and Duration min 3x week  1 week   Pertinent Vitals/Pain denied    SLP Swallow Goals  see care plan   Swallow Study Prior Functional Status   pt resides at [facility] assisted w/ ADLs as nec. Per report.    General Date of Onset: 06/09/15 Other Pertinent Information: Pt is a 81 y.o. female with a known history of Hearing Loss, essential hypertension, macular degeneration, osteoporosis and spinal stenosis who presents w/ bloody BMs. Patient reports approximately 2-3 weeks ago she noticed some blood when she wiped. However today she had 3 large bowel movements with bright red blood. She denies any abdominal pain associated with this. She was brought to the ER for further evaluation. Type of Study: Bedside swallow evaluation Previous Swallow Assessment: none noted Diet Prior to this Study: Regular;Thin liquids (?) Temperature Spikes Noted: No (wbc 9.1) Respiratory Status: Room air History of Recent Intubation: No Behavior/Cognition: Alert;Cooperative;Pleasant mood;Requires cueing;Distractible (unsure of pt's baseline Cognitive status) Oral Cavity - Dentition: Adequate natural dentition/normal for age Self-Feeding Abilities: Needs assist;Needs set up;Total assist Patient Positioning: Upright in bed Baseline Vocal Quality: Normal Volitional Cough: Strong Volitional Swallow: Able to elicit    Oral/Motor/Sensory Function Overall Oral Motor/Sensory Function: Appears within functional limits for tasks  assessed Labial ROM: Within Functional Limits Labial Symmetry: Within Functional Limits Labial  Strength: Within Functional Limits Lingual ROM: Within Functional Limits Lingual Symmetry: Within Functional Limits Lingual Strength: Within Functional Limits Facial Symmetry: Within Functional Limits Mandible: Within Functional Limits   Ice Chips Ice chips: Within functional limits Presentation: Spoon (fed; 3 trials)   Thin Liquid Thin Liquid: Impaired Presentation: Cup;Self Fed (assisted) Oral Phase Impairments:  (none) Oral Phase Functional Implications:  (none) Pharyngeal  Phase Impairments: Multiple swallows;Cough - Delayed (audible swallows - x2 trials)    Nectar Thick Nectar Thick Liquid: Within functional limits Presentation: Cup;Self Fed (assisted; 5 trials)   Honey Thick Honey Thick Liquid: Not tested   Puree Puree: Within functional limits Presentation: Spoon (fed; 8 trials)   Solid   GO    Solid: Within functional limits Presentation: Spoon (fed; 2 trials) Other Comments: crushed cracker (smaller pieces) in puree      Leah SomKatherine Fredda Clarida, MS, CCC-SLP  [name] 06/16/2015,11:24 AM

## 2015-06-16 NOTE — Progress Notes (Signed)
[facility] [place] at [facility]   PATIENT NAME: [name]    MR#:  [id number]  DATE OF BIRTH:  01/27/1913  SUBJECTIVE: Patient is more alert and awake complaints of fall or pains neck pain and arthritis pains.   CHIEF COMPLAINT:    REVIEW OF SYSTEMS:  Exam patient is a alert and oriented.  Cardiovascular; no chest pain, no palpitations. Pulmonary: No shortness of breath, GI: No nausea no vomiting or diarrhea.  Neurological alert awake oriented . muscular skeletal; c/o joint pains/arthritis pain. Psych; Oriented to time ,place.person   DRUG ALLERGIES:   Allergies  Allergen Reactions  . Celebrex [Celecoxib]     VITALS:  Blood pressure 142/65, pulse 79, temperature 97.6 F (36.4 C), temperature source Oral, resp. rate 18, height 5\' 3"  (1.6 m), weight 69.718 kg (153 lb 11.2 oz), SpO2 97 %.  PHYSICAL EXAMINATION:  GENERAL:  81 y.o.-year-old patient lying in the bed with no acute distress.  EYES: Pupils equal, round, reactive to light and accommodation. No scleral icterus.  HEENT: Head atraumatic, normocephalic. Oropharynx and nasopharynx clear.  NECK:  Supple, no jugular venous distention. No thyroid enlargement, no tenderness.  LUNGS: Normal breath sounds bilaterally, no wheezing, rales,rhonchi or crepitation. No use of accessory muscles of respiration.  CARDIOVASCULAR: S1, S2 normal. No murmurs, rubs, or gallops.  ABDOMEN: Soft, nontender, nondistended. Bowel sounds present. No organomegaly or mass.  EXTREMITIES: No pedal edema, cyanosis, or clubbing.  NEUROLOGIC: He confused ,unable to do neurological exam. PSYCHIATRIC: The patient is  lethargic SKIN: No obvious rash, lesion, bilateral leg ulcers with clean dressing   LABORATORY PANEL:   CBC  Recent Labs Lab 06/15/15 0536  WBC 9.1  HGB 11.0*  HCT 31.0*  PLT 263    ------------------------------------------------------------------------------------------------------------------  Chemistries   Recent Labs Lab 06/09/15 1554  06/15/15 0536  NA 137  < > 141  K 3.9  < > 3.1*  CL 101  < > 109  CO2 28  < > 24  GLUCOSE 133*  < > 126*  BUN 44*  < > 8  CREATININE 0.98  < > 0.51  CALCIUM 9.5  < > 9.0  AST 21  --   --   ALT 13*  --   --   ALKPHOS 66  --   --   BILITOT 0.8  --   --   < > = values in this interval not displayed. ------------------------------------------------------------------------------------------------------------------  Cardiac Enzymes No results for input(s): TROPONINI in the last 168 hours. ------------------------------------------------------------------------------------------------------------------  RADIOLOGY:  Dg Chest Port 1 View  06/14/2015  CLINICAL DATA:  Cough. EXAM: PORTABLE CHEST 1 VIEW COMPARISON:  June 03, 2012. FINDINGS: The heart size and mediastinal contours are within normal limits. No pneumothorax or pleural effusion is noted. Mild bibasilar subsegmental atelectasis is noted. Mild degenerative changes seen involving both glenohumeral joints. IMPRESSION: Mild bibasilar subsegmental atelectasis. Electronically Signed   By: Lupita RaiderJames  Green Jr, M.D.   On: 06/14/2015 14:08    EKG:   Orders placed or performed in visit on 06/09/15  . EKG 12-Lead  . EKG 12-Lead  . EKG 12-Lead    ASSESSMENT AND PLAN:   81 year old female with a history of hypertension who presents with bright red blood per rectum.  #Altered mental status due to UTI; has ESBL UTI start imipenem discontinue Rocephin.  # Bright red blood per rectum: secondary to diverticular bleed, internal hemorrhoids   GI bleeding scan negative. Continue per rectal hydrocortisone cream for internal  hemorrhoids Hemoglobin11.6--> 10.6 -->10.6, probably hemodilution, no other episodes of bleeding were noticed for RN's report   GI has recommended to  discharge patient from his standpoint Patient is on Protonix.  NSAIDs will be discontinued for now.    2. Essential hypertension: Patient's blood pressure is acceptable ; can restart the Norvasc.  N3. Bilateral leg wounds POA: Continue wound care   4. Acute urinary retention: Secondary to overflow incontinence Urology has recommended to discharge with Foley catheter and outpatient follow-up Avoid anticholinergics and narcotics as tolerated  U 5. Hypokalemia replete and recheck in a.m.  6. Generalized weakness: PT eval-recommending skilled nursing care  7. Confusion. Due to delirium from UTI: Daily and resolving contusion resolving . Recommends Dysphagia 2 diet with NTL>  Medical recorrds are reviewed and case discussed with Care Management/Social Workerr. Marland Kitchen  CODE STATUS: DNR  TOTAL TIME TAKING CARE OF THIS PATIENT: 35 minutes.   POSSIBLE D/C IN 2-3 DAYS, DEPENDING ON CLINICAL CONDITION.   Katha Hamming M.D on 06/16/2015 at 12:40 PM  Between 7am to 6pm - Pager - [phone] After 6pm go to [email or web address] - password [email or web address]  [place] [facility]  Office  [phone]  CC: Primary care physician; No PCP Per Patient

## 2015-06-16 NOTE — Progress Notes (Signed)
CSW is following for dc planning. Pt is from ALF, will need SNF at dc due to deconditioning. CSW will follow up with nephew in the morning for choice in SNF. Pt has multiple options.   Leah Gristara Ardis Fullwood, LCSW [phone]

## 2015-06-16 NOTE — Progress Notes (Signed)
Physical Therapy Treatment Patient Details Name: [name] MRN: [id number] DOB: 23-Dec-1912 Today's Date: 06/16/2015    History of Present Illness Pt arrived with complaints of bright red blood per rectum and hypokalemia. PMH: macular degeneraltion, osteoporosis, HTN, lumbar spinal stenosis, hyperlipidemia, and bilateral leg wounds. Pt is AOx4 at time of evaluation but requires some assistance with the year as she continues to repeat that it is "[birth year]16." History supplemented by friend who is present for evaluation. Friend reports 1 fall at facility in the last 12 months. Pt was previously independent with rolling walker for limited facility ambulation.     PT Comments    Pt progressing today and able to participate in therapy tasks. Pt is no longer combative ore uncooperative. She was able to progress bed exercises and was also willing to attempt transfers in the form of sit-to-stands. Pt does not currently have the balance to progress to ambulation. Due to her strength and activity tolerance deficits she will continue to benefit from skilled PT in order for her to return to premorbid state. Will progress to ambulation when appropriate.   Follow Up Recommendations  SNF     Equipment Recommendations  None recommended by PT    Recommendations for Other Services       Precautions / Restrictions Precautions Precautions: Fall Restrictions Weight Bearing Restrictions: No    Mobility  Bed Mobility Overal bed mobility: Needs Assistance Bed Mobility: Sit to Supine;Supine to Sit     Supine to sit: Max assist;+2 for physical assistance     General bed mobility comments: Pt needs heavy assist for trunk and LEs to get to EOB and back into supine. Pt attempts to use hands for reaching, however she is limited by pain in arms   Transfers Overall transfer level: Needs assistance Equipment used: Rolling walker (2 wheeled) Transfers: Sit to/from Stand Sit to Stand: Mod assist;+2 physical  assistance         General transfer comment: Pt needs assist to get into standing as well as verbal and tactile cues to push hips forward up under her and lifter her chest. Pt does not have stability to stand independently, needs assist prevent posterior LOB.   Ambulation/Gait Ambulation/Gait assistance:  (Not safe this date)               Stairs            Wheelchair Mobility    Modified Rankin (Stroke Patients Only)       Balance Overall balance assessment: Needs assistance Sitting-balance support: Bilateral upper extremity supported Sitting balance-[name] Scale: Fair     Standing balance support: Bilateral upper extremity supported Standing balance-[name] Scale: Poor                      Cognition Arousal/Alertness: Awake/alert Behavior During Therapy: WFL for tasks assessed/performed;Agitated Overall Cognitive Status: History of cognitive impairments - at baseline                      Exercises Other Exercises Other Exercises: Pt performed therex bilaterally x 10 reps at min assist for faciliation of movement. Exercises performed: ankle pumps, quad sets, SAQ, SLR, hip abd, and heel slides.  (Pt hard of hearing. Project voice for understanding.) Other Exercises: Pt performed two sit-to-stands with 1 minute of standing with each attempt. Pt needing tactile cues to stand up tall and push hips forward. Pt fatigues after 1 minute and needs to sit back down. Rest  breaks required inbetween secondary to fatigue.     General Comments        Pertinent Vitals/Pain Pain Assessment: No/denies pain    Home Living                      Prior Function            PT Goals (current goals can now be found in the care plan section) Acute Rehab PT Goals Patient Stated Goal: to do therapy  PT Goal Formulation: With patient Time For Goal Achievement: 06/27/15 Potential to Achieve Goals: Fair Progress towards PT goals: Progressing toward  goals    Frequency  Min 2X/week    PT Plan Current plan remains appropriate    Co-evaluation             End of Session Equipment Utilized During Treatment: Gait belt Activity Tolerance: Patient tolerated treatment well Patient left: in bed;with call bell/phone within reach;with family/visitor present;with nursing/sitter in room     Time: 1010-1045 PT Time Calculation (min) (ACUTE ONLY): 35 min  Charges:                       G CodesBenna Dunks:      Nahomi Hegner 06/16/2015, 1:23 PM  Benna Dunksasey Melat Wrisley, SPT. [phone]

## 2015-06-17 ENCOUNTER — Encounter
Admission: RE | Admit: 2015-06-17 | Discharge: 2015-06-17 | Disposition: A | Discharge status: Home / Self Care | Payer: Medicare Other | Source: Ambulatory Visit | Attending: Internal Medicine | Admitting: Internal Medicine

## 2015-06-17 MED ORDER — ERTAPENEM SODIUM 1 G IJ SOLR
1.0000 g | INTRAMUSCULAR | Status: DC
  Administered 2015-06-17: 1 g via INTRAVENOUS
  Filled 2015-06-17: qty 1

## 2015-06-17 MED ORDER — HYDROCORTISONE 2.5 % RE CREA
TOPICAL_CREAM | Freq: Three times a day (TID) | RECTAL | Status: AC
Start: 2015-06-17 — End: ?

## 2015-06-17 MED ORDER — SODIUM CHLORIDE 0.9 % IV SOLN
1.0000 g | INTRAVENOUS | Status: DC
  Filled 2015-06-17: qty 1

## 2015-06-17 MED ORDER — SODIUM CHLORIDE 0.9 % IV SOLN
500.0000 mg | INTRAVENOUS | Status: DC
  Filled 2015-06-17: qty 0.5

## 2015-06-17 MED ORDER — PANTOPRAZOLE SODIUM 40 MG PO TBEC: 40.0000 mg | DELAYED_RELEASE_TABLET | Freq: Every day | ORAL | Status: DC

## 2015-06-17 MED ORDER — PANTOPRAZOLE SODIUM 40 MG PO TBEC
40.0000 mg | DELAYED_RELEASE_TABLET | Freq: Every day | ORAL | Status: AC
Start: 2015-06-17 — End: ?

## 2015-06-17 MED ORDER — POTASSIUM CHLORIDE 20 MEQ PO PACK
40.0000 meq | PACK | Freq: Once | ORAL | Status: AC
  Administered 2015-06-17: 40 meq via ORAL
  Filled 2015-06-17: qty 2

## 2015-06-17 MED ORDER — SODIUM CHLORIDE 0.9 % IV SOLN: 1.0000 g | INTRAVENOUS | Status: DC

## 2015-06-17 NOTE — Progress Notes (Signed)
Mid central line placed. Invaz infusing. Called report to SNF and EMS for transport.

## 2015-06-17 NOTE — Progress Notes (Signed)
[facility] discharged and [facility] to continue care. VSS. Care packet given to EMS.

## 2015-06-17 NOTE — Progress Notes (Signed)
Spoke to Dr. Luberta MutterKonidena. PICC line was unable to advance. Place midline instead per MD.

## 2015-06-17 NOTE — Discharge Summary (Signed)
[name], is a 81 y.o. female  DOB [birth year]  MRN [id number].  Admission date:  06/09/2015  Admitting Physician  Adrian SaranSital Mody, MD  Discharge Date:  06/17/2015   Primary MD  No PCP Per Patient  Recommendations for primary care physician for things to follow:  Follow up with PMD in one week  Admission Diagnosis  Hematochezia [K92.1] GIB (gastrointestinal bleeding) [K92.2]   Discharge Diagnosis  Hematochezia [K92.1] GIB (gastrointestinal bleeding) [K92.2]    Active Problems:   GIB (gastrointestinal bleeding)      Past Medical History  Diagnosis Date  . Macular degeneration   . Osteoporosis   . Hypertension   . Spinal stenosis of lumbar region   . Hyperlipidemia     Past Surgical History  Procedure Laterality Date  . Joint replacement Right     hip  . Abdominal hysterectomy         History of present illness and  Hospital Course:     Kindly see H&P for history of present illness and admission details, please review complete Labs, Consult reports and Test reports for all details in brief  HPI  from the history and physical done on the day of admission [name] is a 81 y.o. female with a known history of essential hypertension and macular degeneration who presents with bloody bowel movement. Patient reports approximately 2-3 weeks ago she noticed some blood when she wiped. However today she had 3 large bowel movements with bright red blood.    Hospital Course    1. Bright red blood from rectum: GI bleeding scan is negative ,seen by GI started on Protonix discontinued  NSAIDS> patient's hemoglobin stayed stable did not require any transfusions. Patient started on now Anusol hemorrhoidal cream. He does not have any further from blood from rectum 2.AMS :due to ESBL UTI: Started on ertapenem, got a  PICC line. Needs to 10 days of IV Invanz./ *Disposition to SNF when bed is available. Patient cannot go back to  Home place of [place] due to  Need for higher level of care, her mental status is back to  Baseline.  #3 hypertension controlled continue Norvasc, chlorthalidone. #4 arthritis and joint pains: Controlled with Percocet ,avoid NSAIDS. 5.4. Acute urinary retention: Secondary to overflow incontinence Urology has recommended to discharge with Foley catheter and outpatient follow-up Avoid anticholinergics and narcotics as tolerated   Discharge Condition: STABLE   Follow UP  Follow-up Information    Follow [facility] On 06/21/2015.   Specialty:  Urology   Why:  @10 :45A Please arrive at 10:30am Manage urinary retention/foley catheter   Contact information:   [address], [address] [place] [id number] [phone]        Discharge Instructions  and  Discharge Medications       Medication List    STOP taking these medications        furosemide 20 MG tablet  Commonly known as:  LASIX      TAKE these medications        acetaminophen 325 MG tablet  Commonly known as:  TYLENOL  Take 650 mg by mouth every 6 (six) hours as needed.     amLODipine 5 MG tablet  Commonly known as:  NORVASC  Take 5 mg by mouth daily.     benzonatate 100 MG capsule  Commonly known as:  TESSALON  Take 100 mg by mouth 3 (three) times daily as needed for cough.  chlorthalidone 25 MG tablet  Commonly known as:  HYGROTON  Take 25 mg by mouth daily.     ENSURE PLUS Liqd  Take 237 mLs by mouth.     ertapenem 1 g in sodium chloride 0.9 % 50 mL  Inject 1 g into the vein daily.     HYDROcodone-acetaminophen 5-325 MG tablet  Commonly known as:  NORCO/VICODIN  Take 1 tablet by mouth every 6 (six) hours as needed for moderate pain.     HYDROcodone-homatropine 5-1.5 MG/5ML syrup  Commonly known as:  HYCODAN  Take 5 mLs by mouth  every 6 (six) hours as needed for cough.     hydrocortisone 2.5 % rectal cream  Commonly known as:  ANUSOL-HC  Place rectally 3 (three) times daily.     hydroxypropyl methylcellulose / hypromellose 2.5 % ophthalmic solution  Commonly known as:  ISOPTO TEARS / GONIOVISC  Place 1 drop into both eyes 2 (two) times daily.     latanoprost 0.005 % ophthalmic solution  Commonly known as:  XALATAN  Place 1 drop into both eyes at bedtime.     lidocaine 5 %  Commonly known as:  LIDODERM  Place 1 patch onto the skin daily. Remove & Discard patch within 12 hours or as directed by MD     moexipril 15 MG tablet  Commonly known as:  UNIVASC  Take 15 mg by mouth daily.     naproxen 250 MG tablet  Commonly known as:  NAPROSYN  Take 250 mg by mouth 3 (three) times daily with meals.     omeprazole 20 MG capsule  Commonly known as:  PRILOSEC  Take 20 mg by mouth daily.     oxybutynin 10 MG 24 hr tablet  Commonly known as:  DITROPAN-XL  Take 10 mg by mouth daily.     pantoprazole 40 MG tablet  Commonly known as:  PROTONIX  Take 1 tablet (40 mg total) by mouth daily.     polyethylene glycol packet  Commonly known as:  MIRALAX / GLYCOLAX  Take 17 g by mouth every other day.     PRESERVISION AREDS 2 Caps  Take 1 capsule by mouth daily.     SYSTANE 0.4-0.3 % Soln  Generic drug:  Polyethyl Glycol-Propyl Glycol  Place 1 drop into both eyes as needed.     triamcinolone cream 0.1 %  Commonly known as:  KENALOG  Apply 1 application topically 2 (two) times daily. To face     TUCKS HEMORRHOIDAL 1-12.5 % rectal ointment  Generic drug:  pramoxine-mineral oil-zinc  Place 1 application rectally 2 (two) times daily as needed for itching.     Vitamin D3 2000 UNITS Tabs  Take 1 capsule by mouth daily.          Diet and Activity recommendation: See Discharge Instructions above   Consults obtained - GI   Major procedures and Radiology Reports - PLEASE review detailed and final reports  for all details, in brief -      Nm Gi Blood Loss  06/10/2015  CLINICAL DATA:  Lower gastrointestinal bleeding. EXAM: NUCLEAR MEDICINE GASTROINTESTINAL BLEEDING SCAN TECHNIQUE: Sequential abdominal images were obtained following intravenous administration of Tc-35m labeled red blood cells. RADIOPHARMACEUTICALS:  21.16 mCi Tc-84m in-vitro labeled red cells. COMPARISON:  None. FINDINGS: No abnormal accumulation is seen to suggest active gastrointestinal bleeding. IMPRESSION: No scintigraphic evidence of active gastrointestinal bleeding. Electronically Signed   By: Lupita Raider, M.D.   On: 06/10/2015 10:37  Dg Chest Port 1 View  06/14/2015  CLINICAL DATA:  Cough. EXAM: PORTABLE CHEST 1 VIEW COMPARISON:  June 03, 2012. FINDINGS: The heart size and mediastinal contours are within normal limits. No pneumothorax or pleural effusion is noted. Mild bibasilar subsegmental atelectasis is noted. Mild degenerative changes seen involving both glenohumeral joints. IMPRESSION: Mild bibasilar subsegmental atelectasis. Electronically Signed   By: Lupita Raider, M.D.   On: 06/14/2015 14:08   Dg Abd 2 Views  06/13/2015  CLINICAL DATA:  Subsequent encounter for several day history of rectal bleeding. EXAM: ABDOMEN - 2 VIEW COMPARISON:  None. FINDINGS: Upright film shows some probable atelectasis at the left lung base. No evidence for intraperitoneal free air. Supine film shows no gaseous bowel dilatation to suggest obstruction. Bones are diffusely demineralized. IMPRESSION: Negative. Electronically Signed   By: Kennith Center M.D.   On: 06/13/2015 08:03    Micro Results    Recent Results (from the past 240 hour(s))  Urine culture     Status: None (Preliminary result)   Collection Time: 06/13/15  2:08 PM  Result Value Ref Range Status   Specimen Description URINE, CLEAN CATCH  Final   Special Requests NONE  Final   Culture   Final    >=100,000 COLONIES/mL ESCHERICHIA COLI Results Called to: ANITA  HARRIS AT 1049 ON 06/16/15 CTJ ESBL-EXTENDED SPECTRUM BETA LACTAMASE-THE ORGANISM IS RESISTANT TO PENICILLINS, CEPHALOSPORINS AND AZTREONAM ACCORDING TO CLSI M100-S15 VOL.25 N01 JAN 2005. HOLDING FOR ADDITIONAL POSSIBLE PATHOGEN    Report Status PENDING  Incomplete   Organism ID, Bacteria ESCHERICHIA COLI  Final      Susceptibility   Escherichia coli - MIC*    AMPICILLIN >=32 RESISTANT Resistant     CEFAZOLIN >=64 RESISTANT Resistant     CEFTRIAXONE >=64 RESISTANT Resistant     CIPROFLOXACIN >=4 RESISTANT Resistant     GENTAMICIN <=1 SENSITIVE Sensitive     IMIPENEM <=0.25 SENSITIVE Sensitive     NITROFURANTOIN <=16 SENSITIVE Sensitive     TRIMETH/SULFA >=320 RESISTANT Resistant     Extended ESBL POSITIVE Resistant     PIP/TAZO Value in next row Sensitive      SENSITIVE<=4    ERTAPENEM Value in next row Sensitive      SENSITIVE<=0.5    LEVOFLOXACIN Value in next row Resistant      RESISTANT>=8    * >=100,000 COLONIES/mL ESCHERICHIA COLI  Culture, blood (routine x 2)     Status: None (Preliminary result)   Collection Time: 06/14/15  2:27 PM  Result Value Ref Range Status   Specimen Description BLOOD RIGHT ASSIST CONTROL  Final   Special Requests   Final    BOTTLES DRAWN AEROBIC AND ANAEROBIC 1CC AEROBIC,1CC ANAEROBIC   Culture NO GROWTH 2 DAYS  Final   Report Status PENDING  Incomplete       Today   Subjective:   [name] today has no headache,no chest abdominal pain,no new weakness tingling or numbness, feels much better  Objective:   Blood pressure 144/50, pulse 91, temperature 99.1 F (37.3 C), temperature source Oral, resp. rate 19, height  (1.6 m), weight 69.718 kg (153 lb 11.2 oz), SpO2 96 %.   Intake/Output Summary (Last 24 hours) at 06/17/15 1303 Last data filed at 06/17/15 1253  Gross per 24 hour  Intake 2089.25 ml  Output   1100 ml  Net 989.25 ml    Exam Awake Alert, Oriented x 3, No new F.N deficits, Normal affect [place].AT,PERRAL Supple  Neck,No  JVD, No cervical lymphadenopathy appriciated.  Symmetrical Chest wall movement, Good air movement bilaterally, CTAB RRR,No Gallops,Rubs or new Murmurs, No Parasternal Heave +ve B.Sounds, Abd Soft, Non tender, No organomegaly appriciated, No rebound -guarding or rigidity. No Cyanosis, Clubbing or edema, No new Rash or bruise  Data Review   CBC w Diff: Lab Results  Component Value Date   WBC 9.1 06/15/2015   WBC 11.9* 06/04/2012   HGB 11.0* 06/15/2015   HGB 12.2 06/04/2012   HCT 31.0* 06/15/2015   HCT 34.2* 06/04/2012   PLT 263 06/15/2015   PLT 203 06/04/2012   LYMPHOPCT 17 06/09/2015   LYMPHOPCT 7.1 06/04/2012   MONOPCT 6 06/09/2015   MONOPCT 6.4 06/04/2012   EOSPCT 2 06/09/2015   EOSPCT 0.1 06/04/2012   BASOPCT 1 06/09/2015   BASOPCT 0.5 06/04/2012    CMP: Lab Results  Component Value Date   NA 141 06/15/2015   NA 142 06/04/2012   K 3.1* 06/15/2015   K 3.6 06/04/2012   CL 109 06/15/2015   CL 104 06/04/2012   CO2 24 06/15/2015   CO2 28 06/04/2012   BUN 8 06/15/2015   BUN 21* 06/04/2012   CREATININE 0.51 06/15/2015   CREATININE 0.84 06/04/2012   PROT 6.6 06/09/2015   PROT 8.3* 12/20/2011   ALBUMIN 3.6 06/09/2015   ALBUMIN 4.1 12/20/2011   BILITOT 0.8 06/09/2015   BILITOT 0.6 12/20/2011   ALKPHOS 66 06/09/2015   ALKPHOS 86 12/20/2011   AST 21 06/09/2015   AST 20 12/20/2011   ALT 13* 06/09/2015   ALT 18 12/20/2011  .   Total Time in preparing paper work, data evaluation and todays exam - 35 minutes  Betha Shadix M.D on 06/17/2015 at 1:03 PM    Note: This dictation was prepared with Dragon dictation along with smaller phrase technology. Any transcriptional errors that result from this process are unintentional.

## 2015-06-17 NOTE — Plan of Care (Signed)
Problem: SLP Dysphagia Goals Goal: Misc Dysphagia Goal Pt will safely tolerate po diet of least restrictive consistency w/ no overt s/s of aspiration noted by Staff/pt/family x3 sessions.    

## 2015-06-17 NOTE — Progress Notes (Signed)
[facility] [place] at [facility]   PATIENT NAME: [name]    MR#:  [id number]  DATE OF BIRTH:  Mar 23, 1913  SUBJECTIVE:  CHIEF COMPLAINT:  Patient is confused and very.She is very hard of hearing,   REVIEW OF SYSTEMS:  Review of systems are unobtainable DRUG ALLERGIES:   Allergies  Allergen Reactions  . Celebrex [Celecoxib]     VITALS:  Blood pressure 144/50, pulse 91, temperature 99.1 F (37.3 C), temperature source Oral, resp. rate 19, height  (1.6 m), weight 69.718 kg (153 lb 11.2 oz), SpO2 96 %.  PHYSICAL EXAMINATION:  GENERAL:  81 y.o.-year-old patient lying in the bed with no acute distress.  EYES: Pupils equal, round, reactive to light and accommodation. No scleral icterus.  HEENT: Head atraumatic, normocephalic. Oropharynx and nasopharynx clear.  NECK:  Supple, no jugular venous distention. No thyroid enlargement, no tenderness.  LUNGS: Normal breath sounds bilaterally, no wheezing, rales,rhonchi or crepitation. No use of accessory muscles of respiration.  CARDIOVASCULAR: S1, S2 normal. No murmurs, rubs, or gallops.  ABDOMEN: Soft, nontender, nondistended. Bowel sounds present. No organomegaly or mass.  EXTREMITIES: No pedal edema, cyanosis, or clubbing.  NEUROLOGIC: He confused ,unable to do neurological exam. PSYCHIATRIC: The patient is  lethargic SKIN: No obvious rash, lesion, bilateral leg ulcers with clean dressing   LABORATORY PANEL:   CBC  Recent Labs Lab 06/15/15 0536  WBC 9.1  HGB 11.0*  HCT 31.0*  PLT 263   ------------------------------------------------------------------------------------------------------------------  Chemistries   Recent Labs Lab 06/15/15 0536  NA 141  K 3.1*  CL 109  CO2 24  GLUCOSE 126*  BUN 8  CREATININE 0.51  CALCIUM 9.0   ------------------------------------------------------------------------------------------------------------------  Cardiac Enzymes No results for  input(s): TROPONINI in the last 168 hours. ------------------------------------------------------------------------------------------------------------------  RADIOLOGY:  No results found.  EKG:   Orders placed or performed in visit on 06/09/15  . EKG 12-Lead  . EKG 12-Lead  . EKG 12-Lead    ASSESSMENT AND PLAN:   81 year old female with a history of hypertension who presents with bright red blood per rectum.  #Altered mental status secondary to acute cystitis;mental status improved.  has ESBL WUJ:WJXBJ ERTAPENAM, needs PICC line for IV ABX,.  # Bright red blood per rectum: secondary to diverticular bleed, internal hemorrhoids   GI bleeding scan negative. Continue per rectal hydrocortisone cream for internal hemorrhoids Hemoglobin11.6--> 10.6 -->10.6, probably hemodilution, no other episodes of bleeding were noticed for RN's report   GI has recommended to discharge patient from his standpoint Patient is on Protonix.  NSAIDs will be discontinued for now.    2. Essential hypertension: Patient's blood pressure is acceptable   Nothing by mouth so hold Norvasc, chlorthalidone at this time. 3. Bilateral leg wounds POA: Continue wound care   4. Acute urinary retention: Secondary to overflow incontinence Urology has recommended to discharge with Foley catheter and outpatient follow-up Avoid anticholinergics and narcotics as tolerated  UTI: Gram-negative rods in the urine following final sensitivity results.ESBL UTI;on Meropenam, 5. Hypokalemia replete and recheck in a.m.  6. Generalized weakness: PT eval-recommending skilled nursing care  7. Medically stable to NH  Today after PICC line,.   All the records are reviewed and case discussed with Care Management/Social Workerr. Marland Kitchen  CODE STATUS: DNR  TOTAL TIME TAKING CARE OF THIS PATIENT: 35 minutes.   POSSIBLE D/C IN 2-3 DAYS, DEPENDING ON CLINICAL CONDITION.   Katha Hamming M.D on 06/17/2015 at 10:32 AM  Between  7am to 6pm -  Pager - [phone] After 6pm go to [email or web address] - password [email or web address] [facility]  [facility]  Office  [phone]  CC: Primary care physician; No PCP Per Patient

## 2015-06-17 NOTE — Care Management Important Message (Signed)
Important Message  Patient Details  Name: Della Gooancy L Hark MRN: [id number] Date of Birth: [birth year]   Medicare Important Message Given:  Yes-fourth notification given    Adonis HugueninBerkhead, Arrow Tomko L, RN 06/17/2015, 11:03 AM

## 2015-06-18 LAB — URINE CULTURE: Culture: >=100000

## 2015-06-19 LAB — CULTURE, BLOOD (ROUTINE X 2): CULTURE: NO GROWTH

## 2015-06-20 ENCOUNTER — Inpatient Hospital Stay
Admission: EM | Admit: 2015-06-20 | Discharge: 2015-06-28 | DRG: 689 | Disposition: A | Discharge status: Skilled Nursing Facility | Payer: Medicare Other | Attending: Internal Medicine | Admitting: Internal Medicine

## 2015-06-20 ENCOUNTER — Inpatient Hospital Stay: Payer: Medicare Other

## 2015-06-20 ENCOUNTER — Emergency Department: Payer: Medicare Other

## 2015-06-20 ENCOUNTER — Encounter: Payer: Self-pay | Admitting: Emergency Medicine

## 2015-06-20 ENCOUNTER — Inpatient Hospital Stay
Admit: 2015-06-20 | Discharge: 2015-06-20 | Disposition: A | Discharge status: Home / Self Care | Payer: Medicare Other | Attending: Internal Medicine | Admitting: Internal Medicine

## 2015-06-20 DIAGNOSIS — Z888 Allergy status to other drugs, medicaments and biological substances status: Secondary | ICD-10-CM | POA: Diagnosis not present

## 2015-06-20 DIAGNOSIS — E785 Hyperlipidemia, unspecified: Secondary | ICD-10-CM | POA: Diagnosis present

## 2015-06-20 DIAGNOSIS — N390 Urinary tract infection, site not specified: Principal | ICD-10-CM | POA: Diagnosis present

## 2015-06-20 DIAGNOSIS — E43 Unspecified severe protein-calorie malnutrition: Secondary | ICD-10-CM | POA: Insufficient documentation

## 2015-06-20 DIAGNOSIS — L97919 Non-pressure chronic ulcer of unspecified part of right lower leg with unspecified severity: Secondary | ICD-10-CM | POA: Diagnosis present

## 2015-06-20 DIAGNOSIS — B9620 Unspecified Escherichia coli [E. coli] as the cause of diseases classified elsewhere: Secondary | ICD-10-CM | POA: Diagnosis present

## 2015-06-20 DIAGNOSIS — Z79899 Other long term (current) drug therapy: Secondary | ICD-10-CM

## 2015-06-20 DIAGNOSIS — M810 Age-related osteoporosis without current pathological fracture: Secondary | ICD-10-CM | POA: Diagnosis present

## 2015-06-20 DIAGNOSIS — R451 Restlessness and agitation: Secondary | ICD-10-CM

## 2015-06-20 DIAGNOSIS — I10 Essential (primary) hypertension: Secondary | ICD-10-CM | POA: Diagnosis present

## 2015-06-20 DIAGNOSIS — L97929 Non-pressure chronic ulcer of unspecified part of left lower leg with unspecified severity: Secondary | ICD-10-CM | POA: Diagnosis present

## 2015-06-20 DIAGNOSIS — I248 Other forms of acute ischemic heart disease: Secondary | ICD-10-CM | POA: Diagnosis present

## 2015-06-20 DIAGNOSIS — Z66 Do not resuscitate: Secondary | ICD-10-CM | POA: Diagnosis present

## 2015-06-20 DIAGNOSIS — I872 Venous insufficiency (chronic) (peripheral): Secondary | ICD-10-CM | POA: Diagnosis present

## 2015-06-20 DIAGNOSIS — I4510 Unspecified right bundle-branch block: Secondary | ICD-10-CM | POA: Diagnosis present

## 2015-06-20 DIAGNOSIS — Z6824 Body mass index (BMI) 24.0-24.9, adult: Secondary | ICD-10-CM

## 2015-06-20 DIAGNOSIS — Z79891 Long term (current) use of opiate analgesic: Secondary | ICD-10-CM

## 2015-06-20 DIAGNOSIS — F419 Anxiety disorder, unspecified: Secondary | ICD-10-CM | POA: Diagnosis present

## 2015-06-20 DIAGNOSIS — M4806 Spinal stenosis, lumbar region: Secondary | ICD-10-CM | POA: Diagnosis present

## 2015-06-20 DIAGNOSIS — G9340 Encephalopathy, unspecified: Secondary | ICD-10-CM | POA: Diagnosis present

## 2015-06-20 DIAGNOSIS — M1990 Unspecified osteoarthritis, unspecified site: Secondary | ICD-10-CM | POA: Diagnosis present

## 2015-06-20 DIAGNOSIS — Z96641 Presence of right artificial hip joint: Secondary | ICD-10-CM | POA: Diagnosis present

## 2015-06-20 DIAGNOSIS — R778 Other specified abnormalities of plasma proteins: Secondary | ICD-10-CM

## 2015-06-20 DIAGNOSIS — H3530 Unspecified macular degeneration: Secondary | ICD-10-CM | POA: Diagnosis present

## 2015-06-20 DIAGNOSIS — F0391 Unspecified dementia with behavioral disturbance: Secondary | ICD-10-CM | POA: Diagnosis present

## 2015-06-20 DIAGNOSIS — R410 Disorientation, unspecified: Secondary | ICD-10-CM

## 2015-06-20 DIAGNOSIS — R7989 Other specified abnormal findings of blood chemistry: Secondary | ICD-10-CM

## 2015-06-20 HISTORY — DX: Gastrointestinal hemorrhage, unspecified: K92.2

## 2015-06-20 LAB — COMPREHENSIVE METABOLIC PANEL
ALT: 15 U/L (ref 14–54)
AST: 22 U/L (ref 15–41)
Albumin: 3 g/dL — ABNORMAL LOW (ref 3.5–5.0)
Alkaline Phosphatase: 75 U/L (ref 38–126)
Anion gap: 8 (ref 5–15)
BILIRUBIN TOTAL: 0.5 mg/dL (ref 0.3–1.2)
BUN: 22 mg/dL — AB (ref 6–20)
CHLORIDE: 105 mmol/L (ref 101–111)
CO2: 26 mmol/L (ref 22–32)
CREATININE: 0.78 mg/dL (ref 0.44–1.00)
Calcium: 9.3 mg/dL (ref 8.9–10.3)
Glucose, Bld: 103 mg/dL — ABNORMAL HIGH (ref 65–99)
POTASSIUM: 3.2 mmol/L — AB (ref 3.5–5.1)
Sodium: 139 mmol/L (ref 135–145)
TOTAL PROTEIN: 6.6 g/dL (ref 6.5–8.1)

## 2015-06-20 LAB — CREATININE, SERUM: Creatinine, Ser: 0.61 mg/dL (ref 0.44–1.00)

## 2015-06-20 LAB — URINALYSIS COMPLETE WITH MICROSCOPIC (ARMC ONLY)
BILIRUBIN URINE: NEGATIVE
GLUCOSE, UA: NEGATIVE mg/dL
HGB URINE DIPSTICK: NEGATIVE
Ketones, ur: NEGATIVE mg/dL
NITRITE: NEGATIVE
Protein, ur: 30 mg/dL — AB
SPECIFIC GRAVITY, URINE: 1.021 (ref 1.005–1.030)
pH: 5 (ref 5.0–8.0)

## 2015-06-20 LAB — CBC
HCT: 33.8 % — ABNORMAL LOW (ref 35.0–47.0)
HEMATOCRIT: 33.5 % — AB (ref 35.0–47.0)
Hemoglobin: 11.2 g/dL — ABNORMAL LOW (ref 12.0–16.0)
Hemoglobin: 11.1 g/dL — ABNORMAL LOW (ref 12.0–16.0)
MCH: 30.2 pg (ref 26.0–34.0)
MCH: 29.8 pg (ref 26.0–34.0)
MCHC: 33.5 g/dL (ref 32.0–36.0)
MCHC: 32.9 g/dL (ref 32.0–36.0)
MCV: 90.2 fL (ref 80.0–100.0)
MCV: 90.5 fL (ref 80.0–100.0)
PLATELETS: 321 10*3/uL (ref 150–440)
PLATELETS: 336 10*3/uL (ref 150–440)
RBC: 3.72 MIL/uL — ABNORMAL LOW (ref 3.80–5.20)
RBC: 3.74 MIL/uL — ABNORMAL LOW (ref 3.80–5.20)
RDW: 14.1 % (ref 11.5–14.5)
RDW: 13.9 % (ref 11.5–14.5)
WBC: 9.2 10*3/uL (ref 3.6–11.0)
WBC: 10 10*3/uL (ref 3.6–11.0)

## 2015-06-20 LAB — TSH: TSH: 1.66 u[IU]/mL (ref 0.350–4.500)

## 2015-06-20 LAB — TROPONIN I
TROPONIN I: 0.27 ng/mL — AB (ref <0.031)
TROPONIN I: 0.21 ng/mL — AB (ref <0.031)
Troponin I: 0.21 ng/mL — ABNORMAL HIGH (ref <0.031)

## 2015-06-20 MED ORDER — BENZONATATE 100 MG PO CAPS
100.0000 mg | ORAL_CAPSULE | Freq: Three times a day (TID) | ORAL | Status: DC | PRN
Start: 2015-06-20 — End: 2015-06-28

## 2015-06-20 MED ORDER — ASPIRIN 81 MG PO CHEW
81.0000 mg | CHEWABLE_TABLET | Freq: Every day | ORAL | Status: DC
  Administered 2015-06-20 – 2015-06-28 (×9): 81 mg via ORAL
  Filled 2015-06-20 (×10): qty 1

## 2015-06-20 MED ORDER — HYDROCODONE-HOMATROPINE 5-1.5 MG/5ML PO SYRP: 5.0000 mL | ORAL_SOLUTION | Freq: Four times a day (QID) | ORAL | Status: DC | PRN

## 2015-06-20 MED ORDER — HEPARIN SODIUM (PORCINE) 5000 UNIT/ML IJ SOLN
5000.0000 [IU] | Freq: Three times a day (TID) | INTRAMUSCULAR | Status: DC
  Administered 2015-06-20 – 2015-06-28 (×24): 5000 [IU] via SUBCUTANEOUS
  Filled 2015-06-20 (×25): qty 1

## 2015-06-20 MED ORDER — TRIAMCINOLONE ACETONIDE 0.1 % EX CREA
1.0000 "application " | TOPICAL_CREAM | Freq: Two times a day (BID) | CUTANEOUS | Status: DC
  Administered 2015-06-20 – 2015-06-28 (×18): 1 via TOPICAL
  Filled 2015-06-20: qty 15

## 2015-06-20 MED ORDER — TRANDOLAPRIL 1 MG PO TABS
1.0000 mg | ORAL_TABLET | Freq: Every day | ORAL | Status: DC
  Administered 2015-06-20 – 2015-06-28 (×8): 1 mg via ORAL
  Filled 2015-06-20 (×6): qty 0.5
  Filled 2015-06-20: qty 1
  Filled 2015-06-20 (×2): qty 0.5
  Filled 2015-06-20 (×2): qty 1
  Filled 2015-06-20: qty 0.5

## 2015-06-20 MED ORDER — ADULT MULTIVITAMIN W/MINERALS CH
1.0000 | ORAL_TABLET | Freq: Every day | ORAL | Status: DC
Start: 2015-06-20 — End: 2015-06-28
  Administered 2015-06-20 – 2015-06-28 (×8): 1 via ORAL
  Filled 2015-06-20 (×8): qty 1

## 2015-06-20 MED ORDER — HALOPERIDOL LACTATE 5 MG/ML IJ SOLN
1.0000 mg | Freq: Four times a day (QID) | INTRAMUSCULAR | Status: DC | PRN
  Administered 2015-06-20 – 2015-06-23 (×3): 1 mg via INTRAVENOUS
  Filled 2015-06-20 (×3): qty 1

## 2015-06-20 MED ORDER — HYPROMELLOSE (GONIOSCOPIC) 2.5 % OP SOLN
1.0000 [drp] | Freq: Two times a day (BID) | OPHTHALMIC | Status: DC
  Filled 2015-06-20: qty 15

## 2015-06-20 MED ORDER — OXYBUTYNIN CHLORIDE ER 5 MG PO TB24
10.0000 mg | ORAL_TABLET | Freq: Every day | ORAL | Status: DC
  Administered 2015-06-20 – 2015-06-28 (×9): 10 mg via ORAL
  Filled 2015-06-20 (×2): qty 2
  Filled 2015-06-20: qty 1
  Filled 2015-06-20: qty 2
  Filled 2015-06-20: qty 1
  Filled 2015-06-20 (×5): qty 2

## 2015-06-20 MED ORDER — NAPROXEN 500 MG PO TABS
250.0000 mg | ORAL_TABLET | Freq: Three times a day (TID) | ORAL | Status: DC
  Administered 2015-06-20 – 2015-06-27 (×20): 250 mg via ORAL
  Filled 2015-06-20 (×20): qty 1

## 2015-06-20 MED ORDER — SODIUM CHLORIDE 0.9 % IV SOLN
1.0000 g | INTRAVENOUS | Status: DC
  Administered 2015-06-20: 1 g via INTRAVENOUS
  Filled 2015-06-20 (×2): qty 1

## 2015-06-20 MED ORDER — CHLORTHALIDONE 25 MG PO TABS
25.0000 mg | ORAL_TABLET | Freq: Every day | ORAL | Status: DC
  Administered 2015-06-20 – 2015-06-28 (×9): 25 mg via ORAL
  Filled 2015-06-20 (×7): qty 1

## 2015-06-20 MED ORDER — AMLODIPINE BESYLATE 5 MG PO TABS
5.0000 mg | ORAL_TABLET | Freq: Every day | ORAL | Status: DC
  Administered 2015-06-20 – 2015-06-22 (×3): 5 mg via ORAL
  Filled 2015-06-20 (×3): qty 1

## 2015-06-20 MED ORDER — LATANOPROST 0.005 % OP SOLN
1.0000 [drp] | Freq: Every day | OPHTHALMIC | Status: DC
  Administered 2015-06-20 – 2015-06-27 (×8): 1 [drp] via OPHTHALMIC
  Filled 2015-06-20: qty 2.5

## 2015-06-20 MED ORDER — ONDANSETRON HCL 4 MG/2ML IJ SOLN: 4.0000 mg | Freq: Four times a day (QID) | INTRAMUSCULAR | Status: DC | PRN

## 2015-06-20 MED ORDER — PRAMOXINE-ZINC OXIDE IN MO 1-12.5 % RE OINT: 1.0000 "application " | TOPICAL_OINTMENT | Freq: Two times a day (BID) | RECTAL | Status: DC | PRN

## 2015-06-20 MED ORDER — SODIUM CHLORIDE 0.9 % IV SOLN
250.0000 mL | INTRAVENOUS | Status: DC | PRN
  Administered 2015-06-20: 250 mL via INTRAVENOUS

## 2015-06-20 MED ORDER — SODIUM CHLORIDE 0.9 % IJ SOLN
3.0000 mL | INTRAMUSCULAR | Status: DC | PRN
  Administered 2015-06-23: 08:00:00 via INTRAVENOUS
  Filled 2015-06-20: qty 10

## 2015-06-20 MED ORDER — ACETAMINOPHEN 650 MG RE SUPP: 650.0000 mg | Freq: Four times a day (QID) | RECTAL | Status: DC | PRN

## 2015-06-20 MED ORDER — SODIUM CHLORIDE 0.9 % IJ SOLN
3.0000 mL | Freq: Two times a day (BID) | INTRAMUSCULAR | Status: DC
  Administered 2015-06-20 – 2015-06-28 (×15): 3 mL via INTRAVENOUS

## 2015-06-20 MED ORDER — ACETAMINOPHEN 325 MG PO TABS
650.0000 mg | ORAL_TABLET | Freq: Four times a day (QID) | ORAL | Status: DC | PRN
  Administered 2015-06-20 – 2015-06-27 (×3): 650 mg via ORAL
  Filled 2015-06-20 (×4): qty 2

## 2015-06-20 MED ORDER — ONDANSETRON HCL 4 MG PO TABS: 4.0000 mg | ORAL_TABLET | Freq: Four times a day (QID) | ORAL | Status: DC | PRN

## 2015-06-20 MED ORDER — POLYETHYLENE GLYCOL 3350 17 G PO PACK
17.0000 g | PACK | ORAL | Status: DC
  Administered 2015-06-20 – 2015-06-22 (×2): 17 g via ORAL
  Filled 2015-06-20 (×2): qty 1

## 2015-06-20 MED ORDER — SODIUM CHLORIDE 0.9 % IJ SOLN
3.0000 mL | Freq: Two times a day (BID) | INTRAMUSCULAR | Status: DC
  Administered 2015-06-20 – 2015-06-25 (×6): 3 mL via INTRAVENOUS

## 2015-06-20 MED ORDER — HYDROCORTISONE 2.5 % RE CREA
TOPICAL_CREAM | Freq: Three times a day (TID) | RECTAL | Status: DC
  Administered 2015-06-20 (×2): via RECTAL
  Administered 2015-06-21: 1 via RECTAL
  Administered 2015-06-21 – 2015-06-28 (×21): via RECTAL
  Filled 2015-06-20: qty 28.35

## 2015-06-20 MED ORDER — PANTOPRAZOLE SODIUM 40 MG PO TBEC
40.0000 mg | DELAYED_RELEASE_TABLET | Freq: Every day | ORAL | Status: DC
  Administered 2015-06-20 – 2015-06-28 (×9): 40 mg via ORAL
  Filled 2015-06-20 (×9): qty 1

## 2015-06-20 MED ORDER — POLYVINYL ALCOHOL 1.4 % OP SOLN
1.0000 [drp] | OPHTHALMIC | Status: DC | PRN
  Filled 2015-06-20: qty 15

## 2015-06-20 MED ORDER — LIDOCAINE 5 % EX PTCH
1.0000 | MEDICATED_PATCH | CUTANEOUS | Status: DC
  Administered 2015-06-21 – 2015-06-28 (×8): 1 via TRANSDERMAL
  Filled 2015-06-20 (×10): qty 1

## 2015-06-20 MED ORDER — VITAMIN D 1000 UNITS PO TABS
2000.0000 [IU] | ORAL_TABLET | Freq: Every day | ORAL | Status: DC
  Administered 2015-06-20 – 2015-06-28 (×9): 2000 [IU] via ORAL
  Filled 2015-06-20 (×9): qty 2

## 2015-06-20 MED ORDER — POLYETHYL GLYCOL-PROPYL GLYCOL 0.4-0.3 % OP SOLN: 1.0000 [drp] | OPHTHALMIC | Status: DC | PRN

## 2015-06-20 NOTE — ED Provider Notes (Signed)
[facility] Emergency Department Provider Note  ____________________________________________  Time seen: Approximately 355 AM  I have reviewed the triage vital signs and the nursing notes.   HISTORY  Chief Complaint Agitation  History limited by patient confusion  HPI [name] is a 81 y.o. female who was sent in by her nursing home facility. The patient was diagnosed with a urinary tract infection last week and was sent back on IV antibiotics. According to EMS the patient became more agitated tonight and attempted to pull out her PICC line. They report the patient has never done this before and became concerned so they sent her here for evaluation. The patient has no complaints at this time.   Past Medical History  Diagnosis Date  . Macular degeneration   . Osteoporosis   . Hypertension   . Spinal stenosis of lumbar region   . Hyperlipidemia   . GI bleed     Patient Active Problem List   Diagnosis Date Noted  . GIB (gastrointestinal bleeding) 06/09/2015  . Traumatic open wound of right lower leg with delayed healing 06/02/2015  . Non-pressure chronic ulcer of left calf with fat layer exposed (HCC) 03/24/2015    Past Surgical History  Procedure Laterality Date  . Joint replacement Right     hip  . Abdominal hysterectomy      Current Outpatient Rx  Name  Route  Sig  Dispense  Refill  . amLODipine (NORVASC) 5 MG tablet   Oral   Take 5 mg by mouth daily.         . benzonatate (TESSALON) 100 MG capsule   Oral   Take 100 mg by mouth 3 (three) times daily as needed for cough.         . chlorthalidone (HYGROTON) 25 MG tablet   Oral   Take 25 mg by mouth daily.         . Cholecalciferol (VITAMIN D3) 2000 UNITS TABS   Oral   Take 1 capsule by mouth daily.         . ertapenem 1 g in sodium chloride 0.9 % 50 mL   Intravenous   Inject 1 g into the vein daily.   14 ampule   0   . heparin flush 10 UNIT/ML SOLN injection  Intravenous   Inject 50 Units into the vein daily.         Marland Kitchen HYDROcodone-acetaminophen (NORCO/VICODIN) 5-325 MG tablet   Oral   Take 1 tablet by mouth every 6 (six) hours as needed for moderate pain.         Marland Kitchen HYDROcodone-homatropine (HYCODAN) 5-1.5 MG/5ML syrup   Oral   Take 5 mLs by mouth every 6 (six) hours as needed for cough.         . hydrocortisone (ANUSOL-HC) 2.5 % rectal cream   Rectal   Place rectally 3 (three) times daily.   30 g   0   . hydroxypropyl methylcellulose / hypromellose (ISOPTO TEARS / GONIOVISC) 2.5 % ophthalmic solution   Both Eyes   Place 1 drop into both eyes 2 (two) times daily.         Marland Kitchen latanoprost (XALATAN) 0.005 % ophthalmic solution   Both Eyes   Place 1 drop into both eyes at bedtime.         . lidocaine (LIDODERM) 5 %   Transdermal   Place 1 patch onto the skin daily. Remove & Discard patch within 12 hours or as directed by  MD         . moexipril (UNIVASC) 15 MG tablet   Oral   Take 15 mg by mouth daily.         . Multiple Vitamins-Minerals (PRESERVISION AREDS 2) CAPS   Oral   Take 1 capsule by mouth daily.         . naproxen (NAPROSYN) 250 MG tablet   Oral   Take 250 mg by mouth 3 (three) times daily with meals.         Marland Kitchen oxybutynin (DITROPAN-XL) 10 MG 24 hr tablet   Oral   Take 10 mg by mouth daily.         . pantoprazole (PROTONIX) 40 MG tablet   Oral   Take 1 tablet (40 mg total) by mouth daily.   30 tablet   0   . Polyethyl Glycol-Propyl Glycol (SYSTANE) 0.4-0.3 % SOLN   Both Eyes   Place 1 drop into both eyes as needed (dry eyes).          . polyethylene glycol (MIRALAX / GLYCOLAX) packet   Oral   Take 17 g by mouth every other day.         . pramoxine-mineral oil-zinc (TUCKS HEMORRHOIDAL) 1-12.5 % rectal ointment   Rectal   Place 1 application rectally 2 (two) times daily as needed for itching.         . sodium chloride 0.9 % injection   Intravenous   Inject 10 mLs into the vein 2  (two) times daily. Flush before and after administration of heparin         . triamcinolone cream (KENALOG) 0.1 %   Topical   Apply 1 application topically 2 (two) times daily. To face           Allergies Celebrex  No family history on file.  Social History Social History  Substance Use Topics  . Smoking status: Never Smoker   . Smokeless tobacco: None  . Alcohol Use: No    Review of Systems Unable to evaluate due to patient confusion  10-point ROS otherwise negative.  ____________________________________________   PHYSICAL EXAM:  VITAL SIGNS: ED Triage Vitals  Enc Vitals Group     BP 06/20/15 0357 131/69 mmHg     Pulse Rate 06/20/15 0357 85     Resp 06/20/15 0357 15     Temp 06/20/15 0357 97.8 F (36.6 C)     Temp Source 06/20/15 0357 Oral     SpO2 06/20/15 0357 93 %     Weight 06/20/15 0357 153 lb (69.4 kg)     Height 06/20/15 0357  (1.626 m)     Head Cir --      Peak Flow --      Pain Score --      Pain Loc --      Pain Edu? --      Excl. in GC? --     Constitutional: Alert and oriented to self, disoriented to place and time. Well appearing and in no acute distress. Eyes: Conjunctivae are normal. PERRL. EOMI. Head: Atraumatic. Nose: No congestion/rhinnorhea. Mouth/Throat: Mucous membranes are moist.  Oropharynx non-erythematous. Cardiovascular: Normal rate, regular rhythm. Grossly normal heart sounds.  Good peripheral circulation. Respiratory: Normal respiratory effort.  No retractions. Lungs CTAB. Gastrointestinal: Soft and nontender. No distention. Positive bowel sounds Musculoskeletal: No lower extremity tenderness nor edema. Neurologic:  Normal speech and language. No gross focal neurologic deficits are appreciated. No gait instability. Skin:  Skin  is warm, dry and intact.  Psychiatric: Mood and affect are normal.   ____________________________________________   LABS (all labs ordered are listed, but only abnormal results are  displayed)  Labs Reviewed  CBC  COMPREHENSIVE METABOLIC PANEL  TROPONIN I  URINALYSIS COMPLETEWITH MICROSCOPIC ([facility] ONLY)   ____________________________________________  EKG  ED ECG REPORT I, Rebecka ApleyWebster,  Mlissa Tamayo P, the attending physician, personally viewed and interpreted this ECG.   Date: 06/20/2015  EKG Time: 404  Rate: 77  Rhythm: normal sinus rhythm, RBBB  Axis: none  Intervals:none  ST&T Change: none  ____________________________________________  RADIOLOGY  CXR:  ____________________________________________   PROCEDURES  Procedure(s) performed: None  Critical Care performed: No  ____________________________________________   INITIAL IMPRESSION / ASSESSMENT AND PLAN / ED COURSE  Pertinent labs & imaging results that were available during my care of the patient were reviewed by me and considered in my medical decision making (see chart for details).  This is a 81 yo F who was sent into the hospital with acute agitation. The patient has some elevation of her troponin, given the agitation and delirium as well as the troponin I will admit the patient to the hospitalist service for further evaluation. Patient continues to have any complaints at this time.  ____________________________________________   FINAL CLINICAL IMPRESSION(S) / ED DIAGNOSES  Final diagnoses:  Delirium  Elevated troponin      Rebecka ApleyAllison P Donnesha Karg, MD 06/20/15 [phone]

## 2015-06-20 NOTE — ED Notes (Signed)
Attempted to give haldol thru picc line, medicine flowed out and down arm. Tried to change position, but med still did not go in. No resistance noted. Wrapped PICC line up with gauze.

## 2015-06-20 NOTE — ED Notes (Signed)
Resumed care from RealitosLuis. Pt talking to herself and picking at her clothing and pulling leads off when this nurse entered room.

## 2015-06-20 NOTE — ED Notes (Signed)
Pt trying to remove her clothing; states she needs to change clothes. Pt redirected.

## 2015-06-20 NOTE — Progress Notes (Signed)
*  PRELIMINARY RESULTS* Echocardiogram 2D Echocardiogram has been performed.  [name] 06/20/2015, 8:41 PM

## 2015-06-20 NOTE — ED Notes (Signed)
Not sure if any of the haldol went in before it leaked out of picc line (still over half of syringe left as it was mixed with 8CC of NS). Inserted 22 g in left ac and gave 0.5 of ordered dose to pt, as she has calmed down some, and do not want to OD pt.

## 2015-06-20 NOTE — ED Notes (Signed)
Pt returned from CT; refuses to wear pulse  O2 sensor.

## 2015-06-20 NOTE — Progress Notes (Signed)
Bilateral unna boots to BLE are clean, dry and intact with brisk capillary refill to toes, toes are warm. No edema noted. No c/o pain or discomfort. Will continue to monitor pt.

## 2015-06-20 NOTE — Plan of Care (Signed)
Problem: Discharge Progression Outcomes Goal: Other Discharge Outcomes/Goals Outcome: Progressing Patient admitted to [facility] to [facility] complaints of agitation.  She was attempting to pull out PICC line which is out about 7 cm and we are not using PICC.  Being treated for ongoing UTI with IV abx.  VSS, swallows pills whole with water.  On contact precautions for esbl in urine.

## 2015-06-20 NOTE — ED Notes (Signed)
Pt presents to ED via EMS from [facility] with c/o of possible UTI; agitation. EMS states pt was diagnosed with a UTI approximately x1 week ago and was given abx, which has been completed. EMS reports pt has been agitated with staff more frequently and has requested to been seen by a medical professional. EMS states staff reports an elevated temperature, afebrile by EMS. Pt arrived to ER disoriented x4. MD at bedside. Pt continues to speak random sentences. NAD noted.

## 2015-06-20 NOTE — ED Notes (Signed)
Pt repositioned in bed. NAD noted.

## 2015-06-20 NOTE — Consult Note (Signed)
WOC wound consult note Reason for Consult:Bilateral [employer] boots application to lower extremities. Changed on Monday and Thursday.  Wound type:Chronic venous insufficiency with ulcerations to bilateral anterior lower legs.   Pressure Ulcer POA: N/A Measurement: Left pretibial leg with 2 nonintact lesions. Distal 1 cm x 0.5 cm x 0.1 cm Proximal 0.5 cm x 0.5 cm x 0.1 cm Right anterior calf 3 lesions 0.5 cm in diameter below knee, proximal 1 cm x 0.5 cm x 0.1 cm distal 0.5 cm x 0.3 cm x 0.1 cm Wound bed:100% pink and moist  Drainage (amount, consistency, odor) Minimal serosanguinous Periwound:Intact Dressing procedure/placement/frequency:Cleanse legs with soap and water. Mepitel silicone contact layer to open wounds, Cover with ABD pad. Wrap from just below toes to just below knee with Kerlix, then Coban. Place stocking over Coban to hold in place, per patient. Change Monday and Thursday.  Will not follow at this time.  Please re-consult if needed.  Maple HudsonKaren Alder Murri RN BSN [facility]ON Pager [phone]

## 2015-06-20 NOTE — H&P (Signed)
[facility] - [place] at [facility]   PATIENT NAME: [name]    MR#:  [id number]  DATE OF BIRTH:  [birth year]  DATE OF ADMISSION:  06/20/2015  PRIMARY CARE PHYSICIAN: Lauro Regulus., MD   REQUESTING/REFERRING PHYSICIAN:   CHIEF COMPLAINT:   Chief Complaint  Patient presents with  . Agitation    HISTORY OF PRESENT ILLNESS: [name]  is a 81 y.o. female with a known history of recent GI bleed, macular degeneration, hypertension, spinal stenosis and hyperlipidemia who was recently hospitalized for GI bleed also at that time was noted to have urinary retention and urinary tract infection. She was growing ESBL Escherichia coli. Patient was sent from the skilled nursing facility for agitation she been trying to pull the PICC line out. Currently she is very agitated her urine analysis still showing infection.  PAST MEDICAL HISTORY:   Past Medical History  Diagnosis Date  . Macular degeneration   . Osteoporosis   . Hypertension   . Spinal stenosis of lumbar region   . Hyperlipidemia   . GI bleed     PAST SURGICAL HISTORY:  Past Surgical History  Procedure Laterality Date  . Joint replacement Right     hip  . Abdominal hysterectomy      SOCIAL HISTORY:  Social History  Substance Use Topics  . Smoking status: Never Smoker   . Smokeless tobacco: Not on file  . Alcohol Use: No    FAMILY HISTORY: No family history on file.  DRUG ALLERGIES:  Allergies  Allergen Reactions  . Celebrex [Celecoxib]     REVIEW OF SYSTEMS:   Confused unable to provide    MEDICATIONS AT HOME:  Prior to Admission medications   Medication Sig Start Date End Date Taking? Authorizing Provider  amLODipine (NORVASC) 5 MG tablet Take 5 mg by mouth daily.   Yes Historical Provider, MD  benzonatate (TESSALON) 100 MG capsule Take 100 mg by mouth 3 (three) times daily as needed for cough.   Yes Historical Provider, MD  chlorthalidone (HYGROTON) 25 MG tablet  Take 25 mg by mouth daily.   Yes Historical Provider, MD  Cholecalciferol (VITAMIN D3) 2000 UNITS TABS Take 1 capsule by mouth daily.   Yes Historical Provider, MD  ertapenem 1 g in sodium chloride 0.9 % 50 mL Inject 1 g into the vein daily. 06/17/15  Yes Katha Hamming, MD  HYDROcodone-acetaminophen (NORCO/VICODIN) 5-325 MG tablet Take 1 tablet by mouth every 6 (six) hours as needed for moderate pain.   Yes Historical Provider, MD  HYDROcodone-homatropine (HYCODAN) 5-1.5 MG/5ML syrup Take 5 mLs by mouth every 6 (six) hours as needed for cough.   Yes Historical Provider, MD  hydrocortisone (ANUSOL-HC) 2.5 % rectal cream Place rectally 3 (three) times daily. 06/17/15  Yes Katha Hamming, MD  hydroxypropyl methylcellulose / hypromellose (ISOPTO TEARS / GONIOVISC) 2.5 % ophthalmic solution Place 1 drop into both eyes 2 (two) times daily.   Yes Historical Provider, MD  latanoprost (XALATAN) 0.005 % ophthalmic solution Place 1 drop into both eyes at bedtime.   Yes Historical Provider, MD  lidocaine (LIDODERM) 5 % Place 1 patch onto the skin daily. Remove & Discard patch within 12 hours or as directed by MD   Yes Historical Provider, MD  moexipril (UNIVASC) 15 MG tablet Take 15 mg by mouth daily.   Yes Historical Provider, MD  Multiple Vitamins-Minerals (PRESERVISION AREDS 2) CAPS Take 1 capsule by mouth daily.   Yes Historical Provider, MD  naproxen (  NAPROSYN) 250 MG tablet Take 250 mg by mouth 3 (three) times daily with meals.   Yes Historical Provider, MD  oxybutynin (DITROPAN-XL) 10 MG 24 hr tablet Take 10 mg by mouth daily.   Yes Historical Provider, MD  pantoprazole (PROTONIX) 40 MG tablet Take 1 tablet (40 mg total) by mouth daily. 06/17/15  Yes Katha Hamming, MD  Polyethyl Glycol-Propyl Glycol (SYSTANE) 0.4-0.3 % SOLN Place 1 drop into both eyes as needed (dry eyes).    Yes Historical Provider, MD  polyethylene glycol (MIRALAX / GLYCOLAX) packet Take 17 g by mouth every other day.    Yes Historical Provider, MD  pramoxine-mineral oil-zinc (TUCKS HEMORRHOIDAL) 1-12.5 % rectal ointment Place 1 application rectally 2 (two) times daily as needed for itching.   Yes Historical Provider, MD  triamcinolone cream (KENALOG) 0.1 % Apply 1 application topically 2 (two) times daily. To face   Yes Historical Provider, MD      PHYSICAL EXAMINATION:   VITAL SIGNS: Blood pressure 119/79, pulse 100, temperature 97.8 F (36.6 C), temperature source Oral, resp. rate 22, height  (1.626 m), weight 69.4 kg (153 lb), SpO2 92 %.  GENERAL:  81 y.o.-year-old patient lying in the bed  agitated EYES: Pupils equal, round, reactive to light and accommodation. No scleral icterus. Extraocular muscles intact.  HEENT: Head atraumatic, normocephalic. Oropharynx and nasopharynx clear.  NECK:  Supple, no jugular venous distention. No thyroid enlargement, no tenderness.  LUNGS: Normal breath sounds bilaterally, no wheezing, rales,rhonchi or crepitation. No use of accessory muscles of respiration.  CARDIOVASCULAR: S1, S2 normal. No murmurs, rubs, or gallops.  ABDOMEN: Soft, nontender, nondistended. Bowel sounds present. No organomegaly or mass.  EXTREMITIES: No pedal edema, cyanosis, or clubbing.  NEUROLOGIC: Cranial nerves II through XII are intact. Muscle strength 5/5 in all extremities. Sensation intact. Gait not checked.  PSYCHIATRIC: Patient's agitated SKIN: No obvious rash, lesion, or ulcer.   LABORATORY PANEL:   CBC  Recent Labs Lab 06/14/15 0413 06/15/15 0536 06/20/15 0401  WBC 8.6 9.1 9.2  HGB 10.6* 11.0* 11.2*  HCT 30.8* 31.0* 33.5*  PLT 254 263 321  MCV 90.0 90.5 90.2  MCH 30.8 32.0 30.2  MCHC 34.2 35.4 33.5  RDW 14.0 14.1 14.1   ------------------------------------------------------------------------------------------------------------------  Chemistries   Recent Labs Lab 06/14/15 0413 06/15/15 0536 06/20/15 0401  NA 140 141 139  K 3.4* 3.1* 3.2*  CL 111 109 105   CO2 GLUCOSE 101* 126* 103*  BUN 12 8 22*  CREATININE 0.58 0.51 0.78  CALCIUM 9.1 9.0 9.3  AST  --   --  22  ALT  --   --  15  ALKPHOS  --   --  75  BILITOT  --   --  0.5   ------------------------------------------------------------------------------------------------------------------ estimated creatinine clearance is 36.7 mL/min (by C-G formula based on Cr of 0.78). ------------------------------------------------------------------------------------------------------------------ No results for input(s): TSH, T4TOTAL, T3FREE, THYROIDAB in the last 72 hours.  Invalid input(s): FREET3   Coagulation profile No results for input(s): INR, PROTIME in the last 168 hours. ------------------------------------------------------------------------------------------------------------------- No results for input(s): DDIMER in the last 72 hours. -------------------------------------------------------------------------------------------------------------------  Cardiac Enzymes  Recent Labs Lab 06/20/15 0401  TROPONINI 0.27*   ------------------------------------------------------------------------------------------------------------------ Invalid input(s): POCBNP  ---------------------------------------------------------------------------------------------------------------  Urinalysis    Component Value Date/Time   COLORURINE YELLOW* 06/20/2015 0425   COLORURINE Yellow 07/13/2012 1100   APPEARANCEUR CLEAR* 06/20/2015 0425   APPEARANCEUR Clear 07/13/2012 1100   LABSPEC 1.021 06/20/2015 0425   LABSPEC 1.016  07/13/2012 1100   PHURINE 5.0 06/20/2015 0425   PHURINE 7.0 07/13/2012 1100   GLUCOSEU NEGATIVE 06/20/2015 0425   GLUCOSEU Negative 07/13/2012 1100   HGBUR NEGATIVE 06/20/2015 0425   HGBUR Negative 07/13/2012 1100   BILIRUBINUR NEGATIVE 06/20/2015 0425   BILIRUBINUR Negative 07/13/2012 1100   KETONESUR NEGATIVE 06/20/2015 0425   KETONESUR Negative 07/13/2012 1100    PROTEINUR 30* 06/20/2015 0425   PROTEINUR Negative 07/13/2012 1100   NITRITE NEGATIVE 06/20/2015 0425   NITRITE Negative 07/13/2012 1100   LEUKOCYTESUR 3+* 06/20/2015 0425   LEUKOCYTESUR Trace 07/13/2012 1100     RADIOLOGY: Ct Head Wo Contrast  06/20/2015  CLINICAL DATA:  Altered mental status. EXAM: CT HEAD WITHOUT CONTRAST TECHNIQUE: Contiguous axial images were obtained from the base of the skull through the vertex without intravenous contrast. COMPARISON:  01/31/2015. FINDINGS: Diffusely enlarged ventricles and subarachnoid spaces. Patchy white matter low density in both cerebral hemispheres. Stable old right thalamic lacunar infarct. No intracranial hemorrhage, mass lesion or CT evidence of acute infarction. The anterior aspect of the left middle nasal turbinate is pneumatized. Otherwise, unremarkable bones and included paranasal sinuses. IMPRESSION: 1. No acute abnormality. 2. Stable atrophy and chronic small vessel white matter ischemic changes. 3. Stable old right thalamic lacunar infarct. Electronically Signed   By: Beckie SaltsSteven  Reid M.D.   On: 06/20/2015 10:05   Dg Chest Portable 1 View  06/20/2015  CLINICAL DATA:  EMS states pt was diagnosed with a UTI approximately x1 week ago and was given abx, which has been completed. EMS reports pt has been agitated with staff more frequently and has requested to been seen by a medical professional. Staff at patient is a nursing facility reports an elevated temperature, not noted currently. Patient is currently disoriented. EXAM: PORTABLE CHEST 1 VIEW COMPARISON:  06/14/2015 FINDINGS: Cardiac silhouette is normal in size and configuration. Aorta is mildly uncoiled. No mediastinal or hilar masses or evidence of adenopathy. Lungs are mildly hyperexpanded. Mild scarring and/ or subsegmental atelectasis at the lung bases is stable. Lungs are otherwise clear. Bony thorax is demineralized. There are advanced arthropathic changes of both shoulder joints,  stable. IMPRESSION: No acute cardiopulmonary disease. Electronically Signed   By: Amie Portlandavid  Ormond M.D.   On: 06/20/2015 09:34    EKG: Orders placed or performed during the hospital encounter of 06/20/15  . ED EKG  . ED EKG    IMPRESSION AND PLAN: Patient is a [age] year old white female who was sent from skilled nursing facility for agitation  1. Acute encephalopathy due to urinary tract infection, she is artery been on antibiotics. At this time I'll ask infectious disease consult to repeat cultures. For agitation will use when necessary haloperidol  2. Recurrent urinary tract infection continue Invanz for time being, however may need to be changed, check a bladder ultrasound  3. Hypertension: Continue amlodipine  4. Elevated troponin likely due to demand ischemia we'll follow her troponin level Place on aspirin echocardiogram of the heart based on her age likely not a candidate for any type of intervention  5. CODE STATUS is DO NOT RESUSCITATE        CODE STATUS: Advance Directive Documentation        Most Recent Value   Type of Advance Directive  Healthcare Power of Attorney   Pre-existing out of facility DNR order (yellow form or pink MOST form)     "MOST" Form in Place?         TOTAL TIME TAKING CARE OF THIS PATIENT:  55 minutes.    Auburn Bilberry M.D on 06/20/2015 at 12:53 PM  Between 7am to 6pm - Pager - [phone]  After 6pm go to [email or web address] - password [email or web address] [facility]  [facility]  Office  [phone]  CC: Primary care physician; Lauro Regulus., MD

## 2015-06-21 ENCOUNTER — Ambulatory Visit: Payer: Self-pay | Admitting: Urology

## 2015-06-21 ENCOUNTER — Encounter: Payer: Self-pay | Admitting: Urology

## 2015-06-21 LAB — BASIC METABOLIC PANEL
ANION GAP: 11 (ref 5–15)
BUN: 18 mg/dL (ref 6–20)
CALCIUM: 9.2 mg/dL (ref 8.9–10.3)
CHLORIDE: 105 mmol/L (ref 101–111)
CO2: 24 mmol/L (ref 22–32)
Creatinine, Ser: 0.66 mg/dL (ref 0.44–1.00)
GFR calc non Af Amer: >60 mL/min (ref >60)
Glucose, Bld: 88 mg/dL (ref 65–99)
Potassium: 3.6 mmol/L (ref 3.5–5.1)
SODIUM: 140 mmol/L (ref 135–145)

## 2015-06-21 LAB — TROPONIN I: TROPONIN I: 0.22 ng/mL — AB (ref <0.031)

## 2015-06-21 LAB — CBC WITH DIFFERENTIAL/PLATELET
BASOS ABS: 0.1 10*3/uL (ref 0–0.1)
BASOS PCT: 1 %
EOS ABS: 0.2 10*3/uL (ref 0–0.7)
Eosinophils Relative: 3 %
HEMATOCRIT: 33 % — AB (ref 35.0–47.0)
HEMOGLOBIN: 11.3 g/dL — AB (ref 12.0–16.0)
Lymphocytes Relative: 17 %
Lymphs Abs: 1.4 10*3/uL (ref 1.0–3.6)
MCH: 30.9 pg (ref 26.0–34.0)
MCHC: 34.4 g/dL (ref 32.0–36.0)
MCV: 89.9 fL (ref 80.0–100.0)
Monocytes Absolute: 0.6 10*3/uL (ref 0.2–0.9)
Monocytes Relative: 7 %
NEUTROS ABS: 6.2 10*3/uL (ref 1.4–6.5)
NEUTROS PCT: 72 %
Platelets: 330 10*3/uL (ref 150–440)
RBC: 3.67 MIL/uL — ABNORMAL LOW (ref 3.80–5.20)
RDW: 14.1 % (ref 11.5–14.5)
WBC: 8.6 10*3/uL (ref 3.6–11.0)

## 2015-06-21 LAB — MRSA PCR SCREENING: MRSA BY PCR: NEGATIVE

## 2015-06-21 MED ORDER — VANCOMYCIN HCL IN DEXTROSE 750-5 MG/150ML-% IV SOLN
750.0000 mg | INTRAVENOUS | Status: DC
  Administered 2015-06-21 – 2015-06-26 (×6): 750 mg via INTRAVENOUS
  Filled 2015-06-21 (×8): qty 150

## 2015-06-21 MED ORDER — SODIUM CHLORIDE 0.9 % IV SOLN
1.0000 g | INTRAVENOUS | Status: DC
  Administered 2015-06-21 – 2015-06-26 (×6): 1 g via INTRAVENOUS
  Filled 2015-06-21 (×8): qty 1

## 2015-06-21 MED ORDER — VANCOMYCIN HCL IN DEXTROSE 750-5 MG/150ML-% IV SOLN
750.0000 mg | Freq: Once | INTRAVENOUS | Status: AC
  Administered 2015-06-21: 750 mg via INTRAVENOUS
  Filled 2015-06-21: qty 150

## 2015-06-21 NOTE — Care Management (Signed)
[facility], then readmit due to encephalopathy.  Midline used for IV antibiotics has become dislodged.  ID consult is pending.  [facility] and it is hoped if does not required continued IV antibiotics that she can discharge back to [facility] with home health.

## 2015-06-21 NOTE — Progress Notes (Signed)
ANTIBIOTIC CONSULT NOTE - INITIAL  Pharmacy Consult for Vancomycin (Day 1) Indication: UTI  Allergies  Allergen Reactions  . Celebrex [Celecoxib]     Patient Measurements: Height:  (162.6 cm) Weight: 144 lb 14.4 oz (65.726 kg) IBW/kg (Calculated) : 54.7 Adjusted Body Weight: 59.1  Vital Signs: Temp: 98.7 F (37.1 C) (10/25 0504) BP: 134/59 mmHg (10/25 0832) Pulse Rate: 87 (10/25 0832) Intake/Output from previous day: 10/24 0701 - 10/25 0700 In: -  Out: 200 [Urine:200] Intake/Output from this shift:    Labs:  Recent Labs  06/20/15 0401 06/20/15 1510 06/21/15 0828  WBC 9.2 10.0 8.6  HGB 11.2* 11.1* 11.3*  PLT 321 336 330  CREATININE 0.78 0.61 0.66   Estimated Creatinine Clearance: 35.8 mL/min (by C-G formula based on Cr of 0.66). No results for input(s): VANCOTROUGH, VANCOPEAK, VANCORANDOM, GENTTROUGH, GENTPEAK, GENTRANDOM, TOBRATROUGH, TOBRAPEAK, TOBRARND, AMIKACINPEAK, AMIKACINTROU, AMIKACIN in the last 72 hours.   Microbiology: Recent Results (from the past 720 hour(s))  Urine culture     Status: None   Collection Time: 06/13/15  2:08 PM  Result Value Ref Range Status   Specimen Description URINE, CLEAN CATCH  Final   Special Requests NONE  Final   Culture   Final    >=100,000 COLONIES/mL ESCHERICHIA COLI Results Called to: ANITA HARRIS AT 1049 ON 06/16/15 CTJ ESBL-EXTENDED SPECTRUM BETA LACTAMASE-THE ORGANISM IS RESISTANT TO PENICILLINS, CEPHALOSPORINS AND AZTREONAM ACCORDING TO CLSI M100-S15 VOL.25 N01 JAN 2005. >=100,000 COLONIES/mL AEROCOCCUS URINAE    Report Status 06/18/2015 FINAL  Final   Organism ID, Bacteria ESCHERICHIA COLI  Final      Susceptibility   Escherichia coli - MIC*    AMPICILLIN >=32 RESISTANT Resistant     CEFAZOLIN >=64 RESISTANT Resistant     CEFTRIAXONE >=64 RESISTANT Resistant     CIPROFLOXACIN >=4 RESISTANT Resistant     GENTAMICIN <=1 SENSITIVE Sensitive     IMIPENEM <=0.25 SENSITIVE Sensitive     NITROFURANTOIN <=16  SENSITIVE Sensitive     TRIMETH/SULFA >=320 RESISTANT Resistant     Extended ESBL POSITIVE Resistant     PIP/TAZO Value in next row Sensitive      SENSITIVE<=4    ERTAPENEM Value in next row Sensitive      SENSITIVE<=0.5    LEVOFLOXACIN Value in next row Resistant      RESISTANT>=8    * >=100,000 COLONIES/mL ESCHERICHIA COLI  Culture, blood (routine x 2)     Status: None   Collection Time: 06/14/15  2:27 PM  Result Value Ref Range Status   Specimen Description BLOOD RIGHT ASSIST CONTROL  Final   Special Requests   Final    BOTTLES DRAWN AEROBIC AND ANAEROBIC 1CC AEROBIC,1CC ANAEROBIC   Culture NO GROWTH 5 DAYS  Final   Report Status 06/19/2015 FINAL  Final    Medical History: Past Medical History  Diagnosis Date  . Macular degeneration   . Osteoporosis   . Hypertension   . Spinal stenosis of lumbar region   . Hyperlipidemia   . GI bleed     Medications:  Scheduled:  . amLODipine  5 mg Oral Daily  . aspirin  81 mg Oral Daily  . chlorthalidone  25 mg Oral Daily  . cholecalciferol  2,000 Units Oral Daily  . ertapenem (INVANZ) IV  1 g Intravenous Q24H  . heparin  5,000 Units Subcutaneous 3 times per day  . hydrocortisone   Rectal TID  . latanoprost  1 drop Both Eyes QHS  . lidocaine  1 patch Transdermal Q24H  . multivitamin with minerals  1 tablet Oral Daily  . naproxen  250 mg Oral TID WC  . oxybutynin  10 mg Oral Daily  . pantoprazole  40 mg Oral Daily  . polyethylene glycol  17 g Oral QODAY  . sodium chloride  3 mL Intravenous Q12H  . sodium chloride  3 mL Intravenous Q12H  . trandolapril  1 mg Oral Daily  . triamcinolone cream  1 application Topical BID  . vancomycin  750 mg Intravenous Once  . vancomycin  750 mg Intravenous Q24H   Infusions:   Assessment: Pharmacy consulted to dose vancomycin for 81 yo female admitted from nursing home with potentially resistant UTI. Patient is on day 2 of ertapenem 1g IV Q24hr.    Goal of Therapy:  Vancomycin trough level  10-15 mcg/ml  Plan:  Will start patient on vancomycin 750mg  IV Q24hr for goal trough of 10-15. Will give stacked dose at 1700. Will continue to follow and obtain trough as clinically indicated.    Expected duration 7 days with resolution of temperature and/or normalization of WBC    Pharmacy will continue to monitor and adjust per consult.    Tava Peery L 06/21/2015,9:38 AM

## 2015-06-21 NOTE — Progress Notes (Signed)
If patient required IV Abx at D/C patient will need skilled nursing level of care. [facility] cannot take care of IV Abx. Clinical Social Worker (CSW) will continue to follow and assist as needed.   Jetta LoutBailey Morgan, LCSWA [phone]

## 2015-06-21 NOTE — Clinical Social Work Note (Signed)
Clinical Social Work Assessment  Patient Details  Name: [name] MRN: [id number] Date of Birth: 08/30/1912  Date of referral:  06/20/15               Reason for consult:  Facility Placement, Other (Comment Required) ([facility]/ [facility])                Permission sought to share information with:  Chartered certified accountant granted to share information::  Yes, Verbal Permission Granted  Name::      Materials engineer (SNF) and [facility] (ALF)   Agency::     Relationship::     Contact Information:     Housing/Transportation Living arrangements for the past 2 months:  [place], [place] of Information:  Patient, Facility Patient Interpreter Needed:  None Criminal Activity/Legal Involvement Pertinent to Current Situation/Hospitalization:  No - Comment as needed Significant Relationships:  Other Family Members ([place] [name] HPOA ) Lives with:  Facility Resident Do you feel safe going back to the place where you live?  Yes Need for family participation in patient care:  Yes (Comment)  Care giving concerns: Patient came to [facility] from [facility] where she was for a couple of days for short term rehab. Patient is a resident at [facility].    Social Worker assessment / plan: Patient is familiar to CSW and is a less then 30 day readmission. Patient discharged from [facility] on 06/17/15 to [facility] for short term rehab. Patient is originally from [place]. Patient was readmitted to [facility] on 06/20/15 for agitation and an UTI. Clinical Education officer, museum (CSW) contacted [employer] at [facility]. Per Caryl Pina patient was verbally abusive and aggressive towards staff. Caryl Pina also reported that patient was biting staff. Per Caryl Pina patient can only return to [facility] if she not longer has agitation or aggressive behaviors.  CSW contacted patient's nurse taking care of her today at [facility]. Per RN patient  is more alert and oriented and has not shown any aggressive behaviors. CSW met with patient alone at bedside. Patient does not have a Actuary. Patient was sitting up in bed and ate most of her breakfast. Patient was oriented to self and place. Patient was distorted to time. Patient did report that it was Tuesday Nov. 2016. Patient was pleasant and reported that she is from [facility]. Patient stated that [facility] staff take very good care of her and she wants to return there. Patient described when she first moved into [place] several years ago and how she packed all her things. Patient did not mention being at [facility] and appears to not remember she was there. Patient also reported that her nephew [name] is her HPOA and gave CSW permission to call him.   CSW contacted Psychologist, prison and probation services at [employer] and made her aware of above. Per Horris Latino if patient can get up on her own she can return to [employer]. CSW asked MD to order PT consult to determine if she can return to [facility] or needs SNF. CSW left patient's nephew [name] HPOA a voicemail making him aware of above. FL2 complete and on chart.    Employment status:  Retired Forensic scientist:  Medicare PT Recommendations:  Not assessed at this time ([facility] asked MD to order PT consult ) Information / Referral to community resources:  [place], Other (Comment Required) (SNF VS. ALF )  Patient/Family's Response to care:  Patient wants to return to [facility]. Voicemail left for [facility]   Patient/Family's Understanding of and Emotional Response to Diagnosis, Current Treatment, and Prognosis: Patient was pleasant throughout assessment and thanked CSW for visit.   Emotional Assessment Appearance:  Appears stated age Attitude/Demeanor/Rapport:    Affect (typically observed):  Accepting, Adaptable, Pleasant Orientation:  Oriented to Self, Oriented to Place, Fluctuating Orientation (Suspected and/or reported  Sundowners) Alcohol / Substance use:  Not Applicable Psych involvement (Current and /or in the community):  No (Comment)  Discharge Needs  Concerns to be addressed:  Discharge Planning Concerns Readmission within the last 30 days:  Yes Current discharge risk:  Chronically ill, Cognitively Impaired Barriers to Discharge:  Continued Medical Work up   Loralyn Freshwater, LCSW 06/21/2015, 10:37 AM

## 2015-06-21 NOTE — Evaluation (Signed)
Physical Therapy Evaluation Patient Details Name: [name] MRN: [id number] DOB: Jan 29, 1913 Today's Date: 06/21/2015   History of Present Illness  Pt was recently admitted with complaints of bright red blood per rectum and hypokalemia. [facility] but returned shortly after admission due to agitation and pulling out her lines. PMH: macular degeneraltion, osteoporosis, HTN, lumbar spinal stenosis, hyperlipidemia, and bilateral leg wounds. Pt is AOx4 at time of evaluation. History supplemented by friend who is present for evaluation. Pt was previously independent with rolling walker for limited facility ambulation.   Clinical Impression  Pt is extremely weak, more so than during her prior admission. She requires maxA+2 for all mobility and is unable to come to full upright standing posture due to poor balance and LE weakness. Unable to ambulate at this time. Pt presents with considerable deconditioning due to prolonged bedrest during 2 admissions. Pt will need to return to SNF at discharge due to weakness and inability to ambulate. Pt will benefit from skilled PT services to address deficits in strength, balance, and mobility in order to return to full function at home.     Follow Up Recommendations SNF    Equipment Recommendations  None recommended by PT    Recommendations for Other Services       Precautions / Restrictions Precautions Precautions: Fall Restrictions Weight Bearing Restrictions: No      Mobility  Bed Mobility Overal bed mobility: Needs Assistance Bed Mobility: Sit to Supine;Supine to Sit     Supine to sit: Max assist;+2 for physical assistance Sit to supine: Max assist;+2 for physical assistance   General bed mobility comments: Pt needs heavy assist for trunk and LEs to get to EOB and back into supine. Pt attempts to use hands for reaching, however she is limited by pain in arms   Transfers Overall transfer level: Needs  assistance Equipment used: Rolling walker (2 wheeled) Transfers: Sit to/from Stand Sit to Stand: Max assist;+2 physical assistance         General transfer comment: Pt with heavy cues required for hand placement and sequencing during transfer. Pt is severely weak in LEs and is unable to come to standing. Pt with poor anterior weight shifting and high fear of falling. Unable to assist pt under arms due to pain so must rely solely on gait belt. Pt unable to come to full standing and unable to ambulate at this time  Ambulation/Gait                Stairs            Wheelchair Mobility    Modified Rankin (Stroke Patients Only)       Balance Overall balance assessment: Needs assistance Sitting-balance support: Bilateral upper extremity supported Sitting balance-[name] Scale: Fair       Standing balance-[name] Scale: Poor Standing balance comment: Unable to come to full standing. Poor anterior weight shifting                             Pertinent Vitals/Pain Pain Assessment: No/denies pain    Home Living Family/patient expects to be discharged to:: Assisted living               Home Equipment: Walker - 2 wheels      Prior Function Level of Independence: Needs assistance   Gait / Transfers Assistance Needed: Independent with rolling walker  ADL's / Homemaking Assistance Needed: Assist for bathing  but independent with dressing        Hand Dominance        Extremity/Trunk Assessment   Upper Extremity Assessment: Generalized weakness           Lower Extremity Assessment: Generalized weakness         Communication   Communication: HOH  Cognition Arousal/Alertness: Awake/alert Behavior During Therapy: WFL for tasks assessed/performed;Agitated Overall Cognitive Status: History of cognitive impairments - at baseline                      General Comments      Exercises General Exercises - Lower Extremity Ankle  Circles/Pumps: Strengthening;Both;15 reps;Supine Short Arc Quad: Strengthening;Both;15 reps;Supine Heel Slides: Strengthening;Both;15 reps;Supine Hip ABduction/ADduction: Strengthening;Both;15 reps;Supine Straight Leg Raises: Strengthening;Both;15 reps;Supine      Assessment/Plan    PT Assessment Patient needs continued PT services  PT Diagnosis Difficulty walking;Generalized weakness   PT Problem List Decreased strength;Decreased activity tolerance;Decreased balance;Decreased range of motion;Decreased mobility;Decreased cognition;Decreased knowledge of use of DME;Decreased safety awareness  PT Treatment Interventions DME instruction;Gait training;Functional mobility training;Therapeutic activities;Therapeutic exercise;Balance training;Neuromuscular re-education;Cognitive remediation;Patient/family education   PT Goals (Current goals can be found in the Care Plan section) Acute Rehab PT Goals Patient Stated Goal: "I want to be able to walk" PT Goal Formulation: With patient Time For Goal Achievement: 07/05/15 Potential to Achieve Goals: Fair    Frequency Min 2X/week   Barriers to discharge        Co-evaluation               End of Session Equipment Utilized During Treatment: Gait belt Activity Tolerance: Patient limited by fatigue Patient left: in bed;with call bell/phone within reach;with family/visitor present Nurse Communication: Mobility status (Need for call bell)         Time: 1620-1650 PT Time Calculation (min) (ACUTE ONLY): 30 min   Charges:   PT Evaluation $Initial PT Evaluation Tier I: 1 Procedure PT Treatments $Therapeutic Exercise: 8-22 mins   PT G Codes:       Sharalyn Ink Rhianne Soman PT, DPT   Marolyn Urschel 06/21/2015, 5:11 PM

## 2015-06-21 NOTE — Consult Note (Signed)
Primary Cardiologist: None  Reason for Consultation : Elevated troponin   HPI : This is a 81yo F who presented from SNF 2/2 agitation and attempting to pull out PICC line. PICC line is present for IV ABX for UTI. She was found to have mildly elevated troponin, thus cardiology was consulted. She denies CP, pressure, tightness, or SOB.        Review of Systems: General: negative for chills, fever, night sweats or weight changes.  Cardiovascular: negative for chest pain, edema, orthopnea, palpitations, paroxysmal nocturnal dyspnea, shortness of breath or dyspnea on exertion Dermatological: negative for rash Respiratory: negative for cough or wheezing Urologic: negative for hematuria Abdominal: negative for nausea, vomiting, diarrhea, bright red blood per rectum, melena, or hematemesis Neurologic: negative for visual changes, syncope, or dizziness All other systems reviewed and are otherwise negative except as noted above.    Past Medical History  Diagnosis Date  . Macular degeneration   . Osteoporosis   . Hypertension   . Spinal stenosis of lumbar region   . Hyperlipidemia   . GI bleed     Medications Prior to Admission  Medication Sig Dispense Refill  . amLODipine (NORVASC) 5 MG tablet Take 5 mg by mouth daily.    . benzonatate (TESSALON) 100 MG capsule Take 100 mg by mouth 3 (three) times daily as needed for cough.    . chlorthalidone (HYGROTON) 25 MG tablet Take 25 mg by mouth daily.    . Cholecalciferol (VITAMIN D3) 2000 UNITS TABS Take 1 capsule by mouth daily.    . ertapenem 1 g in sodium chloride 0.9 % 50 mL Inject 1 g into the vein daily. 14 ampule 0  . HYDROcodone-acetaminophen (NORCO/VICODIN) 5-325 MG tablet Take 1 tablet by mouth every 6 (six) hours as needed for moderate pain.    Marland Kitchen HYDROcodone-homatropine (HYCODAN) 5-1.5 MG/5ML syrup Take 5 mLs by mouth every 6 (six) hours as needed for cough.    . hydrocortisone (ANUSOL-HC) 2.5 % rectal cream Place rectally 3  (three) times daily. 30 g 0  . hydroxypropyl methylcellulose / hypromellose (ISOPTO TEARS / GONIOVISC) 2.5 % ophthalmic solution Place 1 drop into both eyes 2 (two) times daily.    Marland Kitchen latanoprost (XALATAN) 0.005 % ophthalmic solution Place 1 drop into both eyes at bedtime.    . lidocaine (LIDODERM) 5 % Place 1 patch onto the skin daily. Remove & Discard patch within 12 hours or as directed by MD    . moexipril (UNIVASC) 15 MG tablet Take 15 mg by mouth daily.    . Multiple Vitamins-Minerals (PRESERVISION AREDS 2) CAPS Take 1 capsule by mouth daily.    . naproxen (NAPROSYN) 250 MG tablet Take 250 mg by mouth 3 (three) times daily with meals.    Marland Kitchen oxybutynin (DITROPAN-XL) 10 MG 24 hr tablet Take 10 mg by mouth daily.    . pantoprazole (PROTONIX) 40 MG tablet Take 1 tablet (40 mg total) by mouth daily. 30 tablet 0  . Polyethyl Glycol-Propyl Glycol (SYSTANE) 0.4-0.3 % SOLN Place 1 drop into both eyes as needed (dry eyes).     . polyethylene glycol (MIRALAX / GLYCOLAX) packet Take 17 g by mouth every other day.    . pramoxine-mineral oil-zinc (TUCKS HEMORRHOIDAL) 1-12.5 % rectal ointment Place 1 application rectally 2 (two) times daily as needed for itching.    . triamcinolone cream (KENALOG) 0.1 % Apply 1 application topically 2 (two) times daily. To face       . amLODipine  5 mg Oral Daily  . aspirin  81 mg Oral Daily  . chlorthalidone  25 mg Oral Daily  . cholecalciferol  2,000 Units Oral Daily  . ertapenem (INVANZ) IV  1 g Intravenous Q24H  . heparin  5,000 Units Subcutaneous 3 times per day  . hydrocortisone   Rectal TID  . latanoprost  1 drop Both Eyes QHS  . lidocaine  1 patch Transdermal Q24H  . multivitamin with minerals  1 tablet Oral Daily  . naproxen  250 mg Oral TID WC  . oxybutynin  10 mg Oral Daily  . pantoprazole  40 mg Oral Daily  . polyethylene glycol  17 g Oral QODAY  . sodium chloride  3 mL Intravenous Q12H  . sodium chloride  3 mL Intravenous Q12H  . trandolapril  1 mg  Oral Daily  . triamcinolone cream  1 application Topical BID    Infusions:    Allergies  Allergen Reactions  . Celebrex [Celecoxib]     Social History   Social History  . Marital Status: Widowed    Spouse Name: N/A  . Number of Children: N/A  . Years of Education: N/A   Occupational History  . Not on file.   Social History Main Topics  . Smoking status: Never Smoker   . Smokeless tobacco: Not on file  . Alcohol Use: No  . Drug Use: Not on file  . Sexual Activity: Not on file   Other Topics Concern  . Not on file   Social History Narrative    No family history on file.  PHYSICAL EXAM: Filed Vitals:   06/21/15 0832  BP: 134/59  Pulse: 87  Temp:   Resp:      Intake/Output Summary (Last 24 hours) at 06/21/15 0844 Last data filed at 06/21/15 0557  Gross per 24 hour  Intake      0 ml  Output    200 ml  Net   -200 ml    General:  Well appearing. No respiratory difficulty HEENT: normal Neck: supple. no JVD. Carotids 2+ bilat; no bruits. No lymphadenopathy or thryomegaly appreciated. Cor: PMI nondisplaced. Regular rate & rhythm. No rubs, gallops or murmurs. Lungs: clear Abdomen: soft, nontender, nondistended. No hepatosplenomegaly. No bruits or masses. Good bowel sounds. Extremities: no cyanosis, clubbing, rash, edema Neuro: alert & oriented x 3, cranial nerves grossly intact. moves all 4 extremities w/o difficulty. Affect pleasant.  Tele: [name] 80 BPM  Results for orders placed or performed during the hospital encounter of 06/20/15 (from the past 24 hour(s))  CBC     Status: Abnormal   Collection Time: 06/20/15  3:10 PM  Result Value Ref Range   WBC 10.0 3.6 - 11.0 K/uL   RBC 3.74 (L) 3.80 - 5.20 MIL/uL   Hemoglobin 11.1 (L) 12.0 - 16.0 g/dL   HCT 78.233.8 (L) 95.635.0 - 21.347.0 %   MCV 90.5 80.0 - 100.0 fL   MCH 29.8 26.0 - 34.0 pg   MCHC 32.9 32.0 - 36.0 g/dL   RDW 08.613.9 57.811.5 - 46.914.5 %   Platelets 336 150 - 440 K/uL  Creatinine, serum     Status: None    Collection Time: 06/20/15  3:10 PM  Result Value Ref Range   Creatinine, Ser 0.61 0.44 - 1.00 mg/dL   GFR calc non Af Amer >60 >60 mL/min   GFR calc Af Amer >60 >60 mL/min  TSH     Status: None   Collection Time: 06/20/15  3:10  PM  Result Value Ref Range   TSH 1.660 0.350 - 4.500 uIU/mL  Troponin I     Status: Abnormal   Collection Time: 06/20/15  3:10 PM  Result Value Ref Range   Troponin I 0.21 (H) <0.031 ng/mL  Troponin I     Status: Abnormal   Collection Time: 06/20/15  7:04 PM  Result Value Ref Range   Troponin I 0.21 (H) <0.031 ng/mL  Troponin I     Status: Abnormal   Collection Time: 06/21/15  1:38 AM  Result Value Ref Range   Troponin I 0.22 (H) <0.031 ng/mL   Ct Head Wo Contrast  06/20/2015  CLINICAL DATA:  Altered mental status. EXAM: CT HEAD WITHOUT CONTRAST TECHNIQUE: Contiguous axial images were obtained from the base of the skull through the vertex without intravenous contrast. COMPARISON:  01/31/2015. FINDINGS: Diffusely enlarged ventricles and subarachnoid spaces. Patchy white matter low density in both cerebral hemispheres. Stable old right thalamic lacunar infarct. No intracranial hemorrhage, mass lesion or CT evidence of acute infarction. The anterior aspect of the left middle nasal turbinate is pneumatized. Otherwise, unremarkable bones and included paranasal sinuses. IMPRESSION: 1. No acute abnormality. 2. Stable atrophy and chronic small vessel white matter ischemic changes. 3. Stable old right thalamic lacunar infarct. Electronically Signed   By: Beckie Salts M.D.   On: 06/20/2015 10:05   US Renal  06/20/2015  CLINICAL DATA:  Urinary tract infection without hematuria EXAM: RENAL / URINARY TRACT ULTRASOUND COMPLETE COMPARISON:  None. FINDINGS: Right Kidney: Length: 9.6 cm. Echogenicity within normal limits. No mass or hydronephrosis visualized. Left Kidney: Length: 9.6 cm. Echogenicity within normal limits. No mass or hydronephrosis visualized. Bladder: Decompressed  by Foley catheter and therefore not evaluated IMPRESSION: Normal renal ultrasound Electronically Signed   By: Esperanza Heir M.D.   On: 06/20/2015 13:55   Dg Chest Portable 1 View  06/20/2015  CLINICAL DATA:  EMS states pt was diagnosed with a UTI approximately x1 week ago and was given abx, which has been completed. EMS reports pt has been agitated with staff more frequently and has requested to been seen by a medical professional. Staff at patient is a nursing facility reports an elevated temperature, not noted currently. Patient is currently disoriented. EXAM: PORTABLE CHEST 1 VIEW COMPARISON:  06/14/2015 FINDINGS: Cardiac silhouette is normal in size and configuration. Aorta is mildly uncoiled. No mediastinal or hilar masses or evidence of adenopathy. Lungs are mildly hyperexpanded. Mild scarring and/ or subsegmental atelectasis at the lung bases is stable. Lungs are otherwise clear. Bony thorax is demineralized. There are advanced arthropathic changes of both shoulder joints, stable. IMPRESSION: No acute cardiopulmonary disease. Electronically Signed   By: Amie Portland M.D.   On: 06/20/2015 09:34     ASSESSMENT: Mildly elevated troponin likely 2/2 demand ischemia   PLAN/DISCUSSION: Pt has no concerning EKG changes, no CP or SOB, no cardiac hx. Will obtain echocardiogram to assess WM and EF. Conservative management as pt is DNR.     Patient and plan discussed with supervising provider, Dr. Adrian Blackwater, who agrees with above findings.   Alinda Sierras Margarito Courser [facility] 06/21/2015 8:44 AM

## 2015-06-21 NOTE — Consult Note (Signed)
[facility] Infectious Disease     Reason for Consult: Recurrent UTI, ESBL E coli, aerococcus    Referring Physician: Serita Grit Date of Admission:  06/20/2015   Active Problems:   Acute encephalopathy   HPI: [name] is a 81 y.o. female  with a history of recent GI bleed, macular degeneration, hypertension, spinal stenosis and hyperlipidemia who was recently hospitalized for GI bleed.  At that time was noted to have urinary retention and urinary tract infection. She was growing ESBL Escherichia coli as well as aerococcus. She was discharged with picc for ertapenem.  Patient was readmitted 10.24 from the skilled nursing facility for agitation she been trying to pull the PICC line out. Repeat UA still showed TNTC wbc. Her WBC is nml, no fevers.  Past Medical History  Diagnosis Date  . Macular degeneration   . Osteoporosis   . Hypertension   . Spinal stenosis of lumbar region   . Hyperlipidemia   . GI bleed    Past Surgical History  Procedure Laterality Date  . Joint replacement Right     hip  . Abdominal hysterectomy     Social History  Substance Use Topics  . Smoking status: Never Smoker   . Smokeless tobacco: None  . Alcohol Use: No   No family history on file.  Allergies:  Allergies  Allergen Reactions  . Celebrex [Celecoxib]     Current antibiotics: Antibiotics Given (last 72 hours)    Date/Time Action Medication Dose Rate   06/20/15 1719 Given   ertapenem (INVANZ) 1 g in sodium chloride 0.9 % 50 mL IVPB 1 g 100 mL/hr   06/21/15 1100 Given   vancomycin (VANCOCIN) IVPB 750 mg/150 ml premix 750 mg 150 mL/hr      MEDICATIONS: . amLODipine  5 mg Oral Daily  . aspirin  81 mg Oral Daily  . chlorthalidone  25 mg Oral Daily  . cholecalciferol  2,000 Units Oral Daily  . ertapenem (INVANZ) IV  1 g Intravenous Q24H  . heparin  5,000 Units Subcutaneous 3 times per day  . hydrocortisone   Rectal TID  . latanoprost  1 drop Both Eyes QHS  . lidocaine  1 patch  Transdermal Q24H  . multivitamin with minerals  1 tablet Oral Daily  . naproxen  250 mg Oral TID WC  . oxybutynin  10 mg Oral Daily  . pantoprazole  40 mg Oral Daily  . polyethylene glycol  17 g Oral QODAY  . sodium chloride  3 mL Intravenous Q12H  . sodium chloride  3 mL Intravenous Q12H  . trandolapril  1 mg Oral Daily  . triamcinolone cream  1 application Topical BID  . vancomycin  750 mg Intravenous Q24H    Review of Systems - 11 systems reviewed and negative per HPI   OBJECTIVE: Temp:  [97.3 F (36.3 C)-98.7 F (37.1 C)] 98.7 F (37.1 C) (10/25 0504) Pulse Rate:  [66-87] 66 (10/25 1131) Resp:  [16-19] 16 (10/25 1131) BP: (117-134)/(47-66) 128/47 mmHg (10/25 1131) SpO2:  [89 %-99 %] 97 % (10/25 1131) Weight:  [65.726 kg (144 lb 14.4 oz)] 65.726 kg (144 lb 14.4 oz) (10/24 1539) Physical Exam  Constitutional:  Thin frail, sleepy HENT: [place]/AT, PERRLA, no scleral icterus Mouth/Throat: Oropharynx is clear and dry. No oropharyngeal exudate.  Cardiovascular: distant   Pulmonary/Chest: Effort normal and breath sounds normal. No respiratory distress.  has no wheezes.  Neck  supple, no nuchal rigidity Abdominal: Soft. Bowel sounds are normal.  exhibits no distension. There is no tenderness.  Lymphadenopathy: no cervical adenopathy. No axillary adenopathy Neurological: alert and oriented to person, place, and time.  Skin: bil legs in uawraps Psychiatric: flat affect,   LABS: Results for orders placed or performed during the hospital encounter of 06/20/15 (from the past 48 hour(s))  CBC     Status: Abnormal   Collection Time: 06/20/15  3:10 PM  Result Value Ref Range   WBC 10.0 3.6 - 11.0 K/uL   RBC 3.74 (L) 3.80 - 5.20 MIL/uL   Hemoglobin 11.1 (L) 12.0 - 16.0 g/dL   HCT 33.8 (L) 35.0 - 47.0 %   MCV 90.5 80.0 - 100.0 fL   MCH 29.8 26.0 - 34.0 pg   MCHC 32.9 32.0 - 36.0 g/dL   RDW 13.9 11.5 - 14.5 %   Platelets 336 150 - 440 K/uL  Creatinine, serum     Status: None    Collection Time: 06/20/15  3:10 PM  Result Value Ref Range   Creatinine, Ser 0.61 0.44 - 1.00 mg/dL   GFR calc non Af Amer >60 >60 mL/min   GFR calc Af Amer >60 >60 mL/min    Comment: (NOTE) The eGFR has been calculated using the CKD EPI equation. This calculation has not been validated in all clinical situations. eGFR's persistently <60 mL/min signify possible Chronic Kidney Disease.   TSH     Status: None   Collection Time: 06/20/15  3:10 PM  Result Value Ref Range   TSH 1.660 0.350 - 4.500 uIU/mL  Troponin I     Status: Abnormal   Collection Time: 06/20/15  3:10 PM  Result Value Ref Range   Troponin I 0.21 (H) <0.031 ng/mL    Comment: RESULTS PREVIOUSLY CALLED WITH LUIS FLORES ON 06/20/15 AT 0506 BY PMH/TB        PERSISTENTLY INCREASED TROPONIN VALUES IN THE RANGE OF 0.04-0.49 ng/mL CAN BE SEEN IN:       -UNSTABLE ANGINA       -CONGESTIVE HEART FAILURE       -MYOCARDITIS       -CHEST TRAUMA       -ARRYHTHMIAS       -LATE PRESENTING MYOCARDIAL INFARCTION       -COPD   CLINICAL FOLLOW-UP RECOMMENDED.   Troponin I     Status: Abnormal   Collection Time: 06/20/15  7:04 PM  Result Value Ref Range   Troponin I 0.21 (H) <0.031 ng/mL    Comment: RESULTS PREVIOUSLY CALLED TO LUIS FLORES ON 06/20/15 AT 5170YF BY PMH/TB        PERSISTENTLY INCREASED TROPONIN VALUES IN THE RANGE OF 0.04-0.49 ng/mL CAN BE SEEN IN:       -UNSTABLE ANGINA       -CONGESTIVE HEART FAILURE       -MYOCARDITIS       -CHEST TRAUMA       -ARRYHTHMIAS       -LATE PRESENTING MYOCARDIAL INFARCTION       -COPD   CLINICAL FOLLOW-UP RECOMMENDED.   Troponin I     Status: Abnormal   Collection Time: 06/21/15  1:38 AM  Result Value Ref Range   Troponin I 0.22 (H) <0.031 ng/mL    Comment: RESULTS PREVIOUSLY CALLED  06/20/15 AT 0506 BY PMH/TB        PERSISTENTLY INCREASED TROPONIN VALUES IN THE RANGE OF 0.04-0.49 ng/mL CAN BE SEEN IN:       -UNSTABLE ANGINA       -  CONGESTIVE HEART FAILURE        -MYOCARDITIS       -CHEST TRAUMA       -ARRYHTHMIAS       -LATE PRESENTING MYOCARDIAL INFARCTION       -COPD   CLINICAL FOLLOW-UP RECOMMENDED.   CBC with Differential/Platelet     Status: Abnormal   Collection Time: 06/21/15  8:28 AM  Result Value Ref Range   WBC 8.6 3.6 - 11.0 K/uL   RBC 3.67 (L) 3.80 - 5.20 MIL/uL   Hemoglobin 11.3 (L) 12.0 - 16.0 g/dL   HCT 33.0 (L) 35.0 - 47.0 %   MCV 89.9 80.0 - 100.0 fL   MCH 30.9 26.0 - 34.0 pg   MCHC 34.4 32.0 - 36.0 g/dL   RDW 14.1 11.5 - 14.5 %   Platelets 330 150 - 440 K/uL   Neutrophils Relative % 72 %   Neutro Abs 6.2 1.4 - 6.5 K/uL   Lymphocytes Relative 17 %   Lymphs Abs 1.4 1.0 - 3.6 K/uL   Monocytes Relative 7 %   Monocytes Absolute 0.6 0.2 - 0.9 K/uL   Eosinophils Relative 3 %   Eosinophils Absolute 0.2 0 - 0.7 K/uL   Basophils Relative 1 %   Basophils Absolute 0.1 0 - 0.1 K/uL  Basic metabolic panel     Status: None   Collection Time: 06/21/15  8:28 AM  Result Value Ref Range   Sodium 140 135 - 145 mmol/L   Potassium 3.6 3.5 - 5.1 mmol/L   Chloride 105 101 - 111 mmol/L   CO2 24 22 - 32 mmol/L   Glucose, Bld 88 65 - 99 mg/dL   BUN 18 6 - 20 mg/dL   Creatinine, Ser 0.66 0.44 - 1.00 mg/dL   Calcium 9.2 8.9 - 10.3 mg/dL   GFR calc non Af Amer >60 >60 mL/min   GFR calc Af Amer >60 >60 mL/min    Comment: (NOTE) The eGFR has been calculated using the CKD EPI equation. This calculation has not been validated in all clinical situations. eGFR's persistently <60 mL/min signify possible Chronic Kidney Disease.    Anion gap 11 5 - 15  MRSA PCR Screening     Status: None   Collection Time: 06/21/15 11:05 AM  Result Value Ref Range   MRSA by PCR NEGATIVE NEGATIVE    Comment:        The GeneXpert MRSA Assay (FDA approved for NASAL specimens only), is one component of a comprehensive MRSA colonization surveillance program. It is not intended to diagnose MRSA infection nor to guide or monitor treatment for MRSA  infections.    No components found for: ESR, C REACTIVE PROTEIN MICRO: Recent Results (from the past 720 hour(s))  Urine culture     Status: None   Collection Time: 06/13/15  2:08 PM  Result Value Ref Range Status   Specimen Description URINE, CLEAN CATCH  Final   Special Requests NONE  Final   Culture   Final    >=100,000 COLONIES/mL ESCHERICHIA COLI Results Called to: ANITA HARRIS AT 1610 ON 06/16/15 [facility] ESBL-EXTENDED SPECTRUM BETA LACTAMASE-THE ORGANISM IS RESISTANT TO PENICILLINS, CEPHALOSPORINS AND AZTREONAM ACCORDING TO CLSI M100-S15 VOL.[place] 2005. >=100,000 COLONIES/mL AEROCOCCUS URINAE    Report Status 06/18/2015 FINAL  Final   Organism ID, Bacteria ESCHERICHIA COLI  Final      Susceptibility   Escherichia coli - MIC*    AMPICILLIN >=32 RESISTANT Resistant     CEFAZOLIN >=64  RESISTANT Resistant     CEFTRIAXONE >=64 RESISTANT Resistant     CIPROFLOXACIN >=4 RESISTANT Resistant     GENTAMICIN <=1 SENSITIVE Sensitive     IMIPENEM <=0.25 SENSITIVE Sensitive     NITROFURANTOIN <=16 SENSITIVE Sensitive     TRIMETH/SULFA >=320 RESISTANT Resistant     Extended ESBL POSITIVE Resistant     PIP/TAZO Value in next row Sensitive      SENSITIVE<=4    ERTAPENEM Value in next row Sensitive      SENSITIVE<=0.5    LEVOFLOXACIN Value in next row Resistant      RESISTANT>=8    * >=100,000 COLONIES/mL ESCHERICHIA COLI  Culture, blood (routine x 2)     Status: None   Collection Time: 06/14/15  2:27 PM  Result Value Ref Range Status   Specimen Description BLOOD RIGHT ASSIST CONTROL  Final   Special Requests   Final    BOTTLES DRAWN AEROBIC AND ANAEROBIC 1CC AEROBIC,1CC ANAEROBIC   Culture NO GROWTH 5 DAYS  Final   Report Status 06/19/2015 FINAL  Final  MRSA PCR Screening     Status: None   Collection Time: 06/21/15 11:05 AM  Result Value Ref Range Status   MRSA by PCR NEGATIVE NEGATIVE Final    Comment:        The GeneXpert MRSA Assay (FDA approved for NASAL  specimens only), is one component of a comprehensive MRSA colonization surveillance program. It is not intended to diagnose MRSA infection nor to guide or monitor treatment for MRSA infections.     IMAGING: Ct Head Wo Contrast  06/20/2015  CLINICAL DATA:  Altered mental status. EXAM: CT HEAD WITHOUT CONTRAST TECHNIQUE: Contiguous axial images were obtained from the base of the skull through the vertex without intravenous contrast. COMPARISON:  01/31/2015. FINDINGS: Diffusely enlarged ventricles and subarachnoid spaces. Patchy white matter low density in both cerebral hemispheres. Stable old right thalamic lacunar infarct. No intracranial hemorrhage, mass lesion or CT evidence of acute infarction. The anterior aspect of the left middle nasal turbinate is pneumatized. Otherwise, unremarkable bones and included paranasal sinuses. IMPRESSION: 1. No acute abnormality. 2. Stable atrophy and chronic small vessel white matter ischemic changes. 3. Stable old right thalamic lacunar infarct. Electronically Signed   By: Claudie Revering M.D.   On: 06/20/2015 10:05   Nm Gi Blood Loss  06/10/2015  CLINICAL DATA:  Lower gastrointestinal bleeding. EXAM: NUCLEAR MEDICINE GASTROINTESTINAL BLEEDING SCAN TECHNIQUE: Sequential abdominal images were obtained following intravenous administration of Tc-24mlabeled red blood cells. RADIOPHARMACEUTICALS:  21.16 mCi Tc-985mn-vitro labeled red cells. COMPARISON:  None. FINDINGS: No abnormal accumulation is seen to suggest active gastrointestinal bleeding. IMPRESSION: No scintigraphic evidence of active gastrointestinal bleeding. Electronically Signed   By: JaMarijo ConceptionM.D.   On: 06/10/2015 10:37   [place]  06/20/2015  CLINICAL DATA:  Urinary tract infection without hematuria EXAM: RENAL / URINARY TRACT ULTRASOUND COMPLETE COMPARISON:  None. FINDINGS: Right Kidney: Length: 9.6 cm. Echogenicity within normal limits. No mass or hydronephrosis visualized. Left Kidney:  Length: 9.6 cm. Echogenicity within normal limits. No mass or hydronephrosis visualized. Bladder: Decompressed by Foley catheter and therefore not evaluated IMPRESSION: Normal renal ultrasound Electronically Signed   By: RaSkipper Cliche.D.   On: 06/20/2015 13:55   Dg Chest Portable 1 View  06/20/2015  CLINICAL DATA:  EMS states pt was diagnosed with a UTI approximately x1 week ago and was given abx, which has been completed. EMS reports pt has been agitated with staff  more frequently and has requested to been seen by a medical professional. Staff at patient is a nursing facility reports an elevated temperature, not noted currently. Patient is currently disoriented. EXAM: PORTABLE CHEST 1 VIEW COMPARISON:  06/14/2015 FINDINGS: Cardiac silhouette is normal in size and configuration. Aorta is mildly uncoiled. No mediastinal or hilar masses or evidence of adenopathy. Lungs are mildly hyperexpanded. Mild scarring and/ or subsegmental atelectasis at the lung bases is stable. Lungs are otherwise clear. Bony thorax is demineralized. There are advanced arthropathic changes of both shoulder joints, stable. IMPRESSION: No acute cardiopulmonary disease. Electronically Signed   By: Lajean Manes M.D.   On: 06/20/2015 09:34   Dg Chest Port 1 View  06/14/2015  CLINICAL DATA:  Cough. EXAM: PORTABLE CHEST 1 VIEW COMPARISON:  June 03, 2012. FINDINGS: The heart size and mediastinal contours are within normal limits. No pneumothorax or pleural effusion is noted. Mild bibasilar subsegmental atelectasis is noted. Mild degenerative changes seen involving both glenohumeral joints. IMPRESSION: Mild bibasilar subsegmental atelectasis. Electronically Signed   By: Marijo Conception, M.D.   On: 06/14/2015 14:08   Dg Abd 2 Views  06/13/2015  CLINICAL DATA:  Subsequent encounter for several day history of rectal bleeding. EXAM: ABDOMEN - 2 VIEW COMPARISON:  None. FINDINGS: Upright film shows some probable atelectasis at the left  lung base. No evidence for intraperitoneal free air. Supine film shows no gaseous bowel dilatation to suggest obstruction. Bones are diffusely demineralized. IMPRESSION: Negative. Electronically Signed   By: Misty Stanley M.D.   On: 06/13/2015 08:03    Assessment:   [name] is a 81 y.o. female   with a history of recent GI bleed, macular degeneration, hypertension, spinal stenosis and hyperlipidemia who was recently hospitalized for GI bleed.  At that time was noted to have urinary retention and urinary tract infection. She was growing ESBL Escherichia coli as well as aerococcus. She was discharged with picc for ertapenem.  Patient was readmitted 10.24 from the skilled nursing facility for agitation she been trying to pull the PICC line out. Repeat UA still showed TNTC wbc. Her WBC is nml, no fevers.  Renal USS is nml. OF note her ESBL E coli was sensitive to macrobid and her renal rxn is nml.  Recommendations REpeat UCX was not done on admission despite UA with TNTC WBC WIll send UCX now - if aerococcus grows will ask lab to do sensitivity testing on it Cont vanco and ertapenem Can consider Macrobid if sensitiive.at discharge Thank you very much for allowing me to participate in the care of this patient. Please call with questions.   Cheral Marker. Ola Spurr, MD

## 2015-06-21 NOTE — Progress Notes (Signed)
[facility] - [place] at [facility]                                                                                                                                                                                            Patient Demographics   [name], is a 81 y.o. female, DOB - [birth year], ZOX:[id number]  Admit date - 06/20/2015   Admitting Physician Auburn Bilberry, MD  Outpatient Primary MD for the patient is Lauro Regulus., MD   LOS - 1  Subjective: Patient's mental status is improved today denies any complaints     Review of Systems:   CONSTITUTIONAL: No documented fever. No fatigue, weakness. No weight gain, no weight loss.  EYES: No blurry or double vision.  ENT: No tinnitus. No postnasal drip. No redness of the oropharynx.  RESPIRATORY: No cough, no wheeze, no hemoptysis. No dyspnea.  CARDIOVASCULAR: No chest pain. No orthopnea. No palpitations. No syncope.  GASTROINTESTINAL: No nausea, no vomiting or diarrhea. No abdominal pain. No melena or hematochezia.  GENITOURINARY: No dysuria or hematuria.  ENDOCRINE: No polyuria or nocturia. No heat or cold intolerance.  HEMATOLOGY: No anemia. No bruising. No bleeding.  INTEGUMENTARY: No rashes. No lesions.  MUSCULOSKELETAL: No arthritis. No swelling. No gout.  NEUROLOGIC: No numbness, tingling, or ataxia. No seizure-type activity.  PSYCHIATRIC: No anxiety. No insomnia. No ADD.    Vitals:   Filed Vitals:   06/20/15 1952 06/21/15 0054 06/21/15 0504 06/21/15 0832  BP: 117/66 123/61 125/52 134/59  Pulse: 83 78 80 87  Temp: 98.1 F (36.7 C)  98.7 F (37.1 C)   TempSrc:      Resp: 19  19   Height:      Weight:      SpO2: 96% 89% 99%     Wt Readings from Last 3 Encounters:  06/20/15 65.726 kg (144 lb 14.4 oz)  06/09/15 69.718 kg (153 lb 11.2 oz)  01/31/15 74.844 kg (165 lb)     Intake/Output Summary (Last 24 hours) at 06/21/15 1045 Last data filed at 06/21/15 0900  Gross per 24  hour  Intake      0 ml  Output    200 ml  Net   -200 ml    Physical Exam:   GENERAL: Pleasant-appearing in no apparent distress.  HEAD, EYES, EARS, NOSE AND THROAT: Atraumatic, normocephalic. Extraocular muscles are intact. Pupils equal and reactive to light. Sclerae anicteric. No conjunctival injection. No oro-pharyngeal erythema.  NECK: Supple. There is no jugular venous distention. No bruits, no lymphadenopathy, no thyromegaly.  HEART: Regular rate and rhythm,. No murmurs, no rubs, no clicks.  LUNGS: Clear to auscultation bilaterally. No rales or rhonchi. No wheezes.  ABDOMEN: Soft, flat, nontender, nondistended. Has good bowel sounds. No hepatosplenomegaly appreciated.  EXTREMITIES: No evidence of any cyanosis, clubbing, or peripheral edema.  +2 pedal and radial pulses bilaterally.  NEUROLOGIC: The patient is alert, awake, and oriented x3 with no focal motor or sensory deficits appreciated bilaterally.  SKIN: Moist and warm with no rashes appreciated.  Psych: Not anxious, depressed LN: No inguinal LN enlargement    Antibiotics   Anti-infectives    Start     Dose/Rate Route Frequency Ordered Stop   06/21/15 1800  ertapenem (INVANZ) 1 g in sodium chloride 0.9 % 50 mL IVPB     1 g 100 mL/hr over 30 Minutes Intravenous Every 24 hours 06/21/15 0935     06/21/15 1800  vancomycin (VANCOCIN) IVPB 750 mg/150 ml premix     750 mg 150 mL/hr over 60 Minutes Intravenous Every 24 hours 06/21/15 0935     06/21/15 1000  vancomycin (VANCOCIN) IVPB 750 mg/150 ml premix     750 mg 150 mL/hr over 60 Minutes Intravenous  Once 06/21/15 0935     06/20/15 1600  ertapenem (INVANZ) 1 g in sodium chloride 0.9 % 50 mL IVPB  Status:  Discontinued     1 g 100 mL/hr over 30 Minutes Intravenous Every 24 hours 06/20/15 1314 06/21/15 0935      Medications   Scheduled Meds: . amLODipine  5 mg Oral Daily  . aspirin  81 mg Oral Daily  . chlorthalidone  25 mg Oral Daily  . cholecalciferol  2,000 Units  Oral Daily  . ertapenem (INVANZ) IV  1 g Intravenous Q24H  . heparin  5,000 Units Subcutaneous 3 times per day  . hydrocortisone   Rectal TID  . latanoprost  1 drop Both Eyes QHS  . lidocaine  1 patch Transdermal Q24H  . multivitamin with minerals  1 tablet Oral Daily  . naproxen  250 mg Oral TID WC  . oxybutynin  10 mg Oral Daily  . pantoprazole  40 mg Oral Daily  . polyethylene glycol  17 g Oral QODAY  . sodium chloride  3 mL Intravenous Q12H  . sodium chloride  3 mL Intravenous Q12H  . trandolapril  1 mg Oral Daily  . triamcinolone cream  1 application Topical BID  . vancomycin  750 mg Intravenous Once  . vancomycin  750 mg Intravenous Q24H   Continuous Infusions:  PRN Meds:.sodium chloride, acetaminophen **OR** acetaminophen, benzonatate, haloperidol lactate, HYDROcodone-homatropine, ondansetron **OR** ondansetron (ZOFRAN) IV, polyvinyl alcohol, pramoxine-mineral oil-zinc, sodium chloride   Data Review:   Micro Results Recent Results (from the past 240 hour(s))  Urine culture     Status: None   Collection Time: 06/13/15  2:08 PM  Result Value Ref Range Status   Specimen Description URINE, CLEAN CATCH  Final   Special Requests NONE  Final   Culture   Final    >=100,000 COLONIES/mL ESCHERICHIA COLI Results Called to: ANITA HARRIS AT 1049 ON 06/16/15 CTJ ESBL-EXTENDED SPECTRUM BETA LACTAMASE-THE ORGANISM IS RESISTANT TO PENICILLINS, CEPHALOSPORINS AND AZTREONAM ACCORDING TO CLSI M100-S15 VOL.25 N01 JAN 2005. >=100,000 COLONIES/mL AEROCOCCUS URINAE    Report Status 06/18/2015 FINAL  Final   Organism ID, Bacteria ESCHERICHIA COLI  Final      Susceptibility   Escherichia coli - MIC*    AMPICILLIN >=32 RESISTANT Resistant     CEFAZOLIN >=64 RESISTANT Resistant     CEFTRIAXONE >=64 RESISTANT Resistant  CIPROFLOXACIN >=4 RESISTANT Resistant     GENTAMICIN <=1 SENSITIVE Sensitive     IMIPENEM <=0.25 SENSITIVE Sensitive     NITROFURANTOIN <=16 SENSITIVE Sensitive      TRIMETH/SULFA >=320 RESISTANT Resistant     Extended ESBL POSITIVE Resistant     PIP/TAZO Value in next row Sensitive      SENSITIVE<=4    ERTAPENEM Value in next row Sensitive      SENSITIVE<=0.5    LEVOFLOXACIN Value in next row Resistant      RESISTANT>=8    * >=100,000 COLONIES/mL ESCHERICHIA COLI  Culture, blood (routine x 2)     Status: None   Collection Time: 06/14/15  2:27 PM  Result Value Ref Range Status   Specimen Description BLOOD RIGHT ASSIST CONTROL  Final   Special Requests   Final    BOTTLES DRAWN AEROBIC AND ANAEROBIC 1CC AEROBIC,1CC ANAEROBIC   Culture NO GROWTH 5 DAYS  Final   Report Status 06/19/2015 FINAL  Final    Radiology Reports Ct Head Wo Contrast  06/20/2015  CLINICAL DATA:  Altered mental status. EXAM: CT HEAD WITHOUT CONTRAST TECHNIQUE: Contiguous axial images were obtained from the base of the skull through the vertex without intravenous contrast. COMPARISON:  01/31/2015. FINDINGS: Diffusely enlarged ventricles and subarachnoid spaces. Patchy white matter low density in both cerebral hemispheres. Stable old right thalamic lacunar infarct. No intracranial hemorrhage, mass lesion or CT evidence of acute infarction. The anterior aspect of the left middle nasal turbinate is pneumatized. Otherwise, unremarkable bones and included paranasal sinuses. IMPRESSION: 1. No acute abnormality. 2. Stable atrophy and chronic small vessel white matter ischemic changes. 3. Stable old right thalamic lacunar infarct. Electronically Signed   By: Beckie Salts M.D.   On: 06/20/2015 10:05   Nm Gi Blood Loss  06/10/2015  CLINICAL DATA:  Lower gastrointestinal bleeding. EXAM: NUCLEAR MEDICINE GASTROINTESTINAL BLEEDING SCAN TECHNIQUE: Sequential abdominal images were obtained following intravenous administration of Tc-44m labeled red blood cells. RADIOPHARMACEUTICALS:  21.16 mCi Tc-47m in-vitro labeled red cells. COMPARISON:  None. FINDINGS: No abnormal accumulation is seen to suggest  active gastrointestinal bleeding. IMPRESSION: No scintigraphic evidence of active gastrointestinal bleeding. Electronically Signed   By: Lupita Raider, M.D.   On: 06/10/2015 10:37   US Renal  06/20/2015  CLINICAL DATA:  Urinary tract infection without hematuria EXAM: RENAL / URINARY TRACT ULTRASOUND COMPLETE COMPARISON:  None. FINDINGS: Right Kidney: Length: 9.6 cm. Echogenicity within normal limits. No mass or hydronephrosis visualized. Left Kidney: Length: 9.6 cm. Echogenicity within normal limits. No mass or hydronephrosis visualized. Bladder: Decompressed by Foley catheter and therefore not evaluated IMPRESSION: Normal renal ultrasound Electronically Signed   By: Esperanza Heir M.D.   On: 06/20/2015 13:55   Dg Chest Portable 1 View  06/20/2015  CLINICAL DATA:  EMS states pt was diagnosed with a UTI approximately x1 week ago and was given abx, which has been completed. EMS reports pt has been agitated with staff more frequently and has requested to been seen by a medical professional. Staff at patient is a nursing facility reports an elevated temperature, not noted currently. Patient is currently disoriented. EXAM: PORTABLE CHEST 1 VIEW COMPARISON:  06/14/2015 FINDINGS: Cardiac silhouette is normal in size and configuration. Aorta is mildly uncoiled. No mediastinal or hilar masses or evidence of adenopathy. Lungs are mildly hyperexpanded. Mild scarring and/ or subsegmental atelectasis at the lung bases is stable. Lungs are otherwise clear. Bony thorax is demineralized. There are advanced arthropathic changes of both shoulder joints,  stable. IMPRESSION: No acute cardiopulmonary disease. Electronically Signed   By: Amie Portland M.D.   On: 06/20/2015 09:34   Dg Chest Port 1 View  06/14/2015  CLINICAL DATA:  Cough. EXAM: PORTABLE CHEST 1 VIEW COMPARISON:  June 03, 2012. FINDINGS: The heart size and mediastinal contours are within normal limits. No pneumothorax or pleural effusion is noted. Mild  bibasilar subsegmental atelectasis is noted. Mild degenerative changes seen involving both glenohumeral joints. IMPRESSION: Mild bibasilar subsegmental atelectasis. Electronically Signed   By: Lupita Raider, M.D.   On: 06/14/2015 14:08   Dg Abd 2 Views  06/13/2015  CLINICAL DATA:  Subsequent encounter for several day history of rectal bleeding. EXAM: ABDOMEN - 2 VIEW COMPARISON:  None. FINDINGS: Upright film shows some probable atelectasis at the left lung base. No evidence for intraperitoneal free air. Supine film shows no gaseous bowel dilatation to suggest obstruction. Bones are diffusely demineralized. IMPRESSION: Negative. Electronically Signed   By: Kennith Center M.D.   On: 06/13/2015 08:03     CBC  Recent Labs Lab 06/15/15 0536 06/20/15 0401 06/20/15 1510 06/21/15 0828  WBC 9.1 9.2 10.0 8.6  HGB 11.0* 11.2* 11.1* 11.3*  HCT 31.0* 33.5* 33.8* 33.0*  PLT 263 321 336 330  MCV 90.5 90.2 90.5 89.9  MCH 32.0 30.2 29.8 30.9  MCHC 35.4 33.5 32.9 34.4  RDW 14.1 14.1 13.9 14.1  LYMPHSABS  --   --   --  1.4  MONOABS  --   --   --  0.6  EOSABS  --   --   --  0.2  BASOSABS  --   --   --  0.1    Chemistries   Recent Labs Lab 06/15/15 0536 06/20/15 0401 06/20/15 1510 06/21/15 0828  NA 141 139  --  140  K 3.1* 3.2*  --  3.6  CL 109 105  --  105  CO2 24 26  --  24  GLUCOSE 126* 103*  --  88  BUN 8 22*  --  18  CREATININE 0.51 0.78 0.61 0.66  CALCIUM 9.0 9.3  --  9.2  AST  --  22  --   --   ALT  --  15  --   --   ALKPHOS  --  75  --   --   BILITOT  --  0.5  --   --    ------------------------------------------------------------------------------------------------------------------ estimated creatinine clearance is 35.8 mL/min (by C-G formula based on Cr of 0.66). ------------------------------------------------------------------------------------------------------------------ No results for input(s): HGBA1C in the last 72  hours. ------------------------------------------------------------------------------------------------------------------ No results for input(s): CHOL, HDL, LDLCALC, TRIG, CHOLHDL, LDLDIRECT in the last 72 hours. ------------------------------------------------------------------------------------------------------------------  Recent Labs  06/20/15 1510  TSH 1.660   ------------------------------------------------------------------------------------------------------------------ No results for input(s): VITAMINB12, FOLATE, FERRITIN, TIBC, IRON, RETICCTPCT in the last 72 hours.  Coagulation profile No results for input(s): INR, PROTIME in the last 168 hours.  No results for input(s): DDIMER in the last 72 hours.  Cardiac Enzymes  Recent Labs Lab 06/20/15 1510 06/20/15 1904 06/21/15 0138  TROPONINI 0.21* 0.21* 0.22*   ------------------------------------------------------------------------------------------------------------------ Invalid input(s): POCBNP    Assessment & Plan   IMPRESSION AND PLAN: Patient is a 81 year old white female who was sent from skilled nursing facility for agitation  1. Acute encephalopathy due to urinary tract infection, now improved  2. Recurrent urinary tract infection case discussed with Dr. Sampson Goon of infectious disease, patient was noted to have ESBL Escherichia coli as well as another organism  Aerococcus. Her urine still shows nitrates and pyuria he recommends starting her on vancomycin in addition to the Invanz. We'll await cultures drawn yesterday from the urine   3. Hypertension: Continue amlodipine  4. Elevated troponin likely due to demand ischemia awake echocardiogram of the heart  5. CODE STATUS is DO NOT RESUSCITATE     Code Status Orders        Start     Ordered   06/20/15 1315  Do not attempt resuscitation (DNR)   Continuous    Question Answer Comment  In the event of cardiac or respiratory ARREST Do not call a  "code blue"   In the event of cardiac or respiratory ARREST Do not perform Intubation, CPR, defibrillation or ACLS   In the event of cardiac or respiratory ARREST Use medication by any route, position, wound care, and other measures to relive pain and suffering. May use oxygen, suction and manual treatment of airway obstruction as needed for comfort.      06/20/15 1314    Advance Directive Documentation        Most Recent Value   Type of Advance Directive  Healthcare Power of Attorney   Pre-existing out of facility DNR order (yellow form or pink MOST form)     "MOST" Form in Place?                DVT Prophylaxis  heparin  Lab Results  Component Value Date   PLT 330 06/21/2015     Time Spent in minutes   35 minutes  Greater than 50% of time spent in care coordination and counseling.   Auburn BilberryPATEL, Brieana Shimmin M.D on 06/21/2015 at 10:45 AM  Between 7am to 6pm - Pager - [phone]  After 6pm go to [email or web address] - password [email or web address] [facility]  [facility]   Office  [phone]

## 2015-06-21 NOTE — Progress Notes (Signed)
Spoke with Dr. Allena KatzPatel about removal of midline in R upper arm, ordered for it to be removed since malpositioned and have ID evaluate for antibiotic use. Trudee KusterBrandi R Mansfield

## 2015-06-21 NOTE — Progress Notes (Signed)
Pt. agitated and attempting to get out of bed, redirection attempted with no success, pt. Repositioned and diversion attempted also unsuccessful. 1mg  of haldol given. Will continue to monitor pt.

## 2015-06-22 LAB — URINALYSIS COMPLETE WITH MICROSCOPIC (ARMC ONLY)
BACTERIA UA: NONE SEEN
Bilirubin Urine: NEGATIVE
GLUCOSE, UA: NEGATIVE mg/dL
HGB URINE DIPSTICK: NEGATIVE
LEUKOCYTES UA: NEGATIVE
Nitrite: NEGATIVE
PH: 5 (ref 5.0–8.0)
Protein, ur: NEGATIVE mg/dL
SPECIFIC GRAVITY, URINE: 1.016 (ref 1.005–1.030)

## 2015-06-22 MED ORDER — AMLODIPINE BESYLATE 5 MG PO TABS
5.0000 mg | ORAL_TABLET | Freq: Every day | ORAL | Status: DC
  Administered 2015-06-22 – 2015-06-28 (×6): 5 mg via ORAL
  Filled 2015-06-22 (×7): qty 1

## 2015-06-22 MED ORDER — CARVEDILOL 3.125 MG PO TABS: 3.1250 mg | ORAL_TABLET | Freq: Two times a day (BID) | ORAL | Status: DC

## 2015-06-22 NOTE — Progress Notes (Signed)
[facility] [place] [facility]                                                                                                                                                                                            Patient Demographics   [name], is a 81 y.o. female, DOB - 10/20/1912, YQM:[id number]  Admit date - 06/20/2015   Admitting Physician Auburn Bilberry, MD  Outpatient Primary MD for the patient is Lauro Regulus., MD   LOS - 2  Subjective:  Patient had her Foley removed yesterday, has 600 cc of urine on bladder scan Denies any   Review of Systems:   CONSTITUTIONAL: No documented fever. No fatigue, weakness. No weight gain, no weight loss.  EYES: No blurry or double vision.  ENT: No tinnitus. No postnasal drip. No redness of the oropharynx.  RESPIRATORY: No cough, no wheeze, no hemoptysis. No dyspnea.  CARDIOVASCULAR: No chest pain. No orthopnea. No palpitations. No syncope.  GASTROINTESTINAL: No nausea, no vomiting or diarrhea. No abdominal pain. No melena or hematochezia.  GENITOURINARY: No dysuria or hematuria.  ENDOCRINE: No polyuria or nocturia. No heat or cold intolerance.  HEMATOLOGY: No anemia. No bruising. No bleeding.  INTEGUMENTARY: No rashes. No lesions.  MUSCULOSKELETAL: No arthritis. No swelling. No gout.  NEUROLOGIC: No numbness, tingling, or ataxia. No seizure-type activity.  PSYCHIATRIC: No anxiety. No insomnia. No ADD.    Vitals:   Filed Vitals:   06/21/15 2001 06/22/15 0633 06/22/15 0938 06/22/15 1144  BP: 132/82 153/66 119/45 134/54  Pulse: 77 66 85 87  Temp: 97.6 F (36.4 C) 97.4 F (36.3 C) 97.9 F (36.6 C) 98.3 F (36.8 C)  TempSrc: Oral Oral Oral   Resp: 16 18 18 19   Height:      Weight:      SpO2: 91% 100% 93% 95%    Wt Readings from Last 3 Encounters:  06/20/15 65.726 kg (144 lb 14.4 oz)  06/09/15 69.718 kg (153 lb 11.2 oz)  01/31/15 74.844 kg (165 lb)     Intake/Output Summary (Last  24 hours) at 06/22/15 1156 Last data filed at 06/21/15 1752  Gross per 24 hour  Intake    200 ml  Output      0 ml  Net    200 ml    Physical Exam:   GENERAL: Pleasant-appearing in no apparent distress.  HEAD, EYES, EARS, NOSE AND THROAT: Atraumatic, normocephalic. Extraocular muscles are intact. Pupils equal and reactive to light. Sclerae anicteric. No conjunctival injection. No oro-pharyngeal erythema.  NECK: Supple. There is no jugular venous distention. No bruits, no lymphadenopathy, no  thyromegaly.  HEART: Regular rate and rhythm,. No murmurs, no rubs, no clicks.  LUNGS: Clear to auscultation bilaterally. No rales or rhonchi. No wheezes.  ABDOMEN: Soft, flat, nontender, nondistended. Has good bowel sounds. No hepatosplenomegaly appreciated.  EXTREMITIES: No evidence of any cyanosis, clubbing, or peripheral edema.  +2 pedal and radial pulses bilaterally.  NEUROLOGIC: The patient is alert, awake, and oriented x3 with no focal motor or sensory deficits appreciated bilaterally.  SKIN: Moist and warm with no rashes appreciated.  Psych: Not anxious, depressed LN: No inguinal LN enlargement    Antibiotics   Anti-infectives    Start     Dose/Rate Route Frequency Ordered Stop   06/21/15 1800  ertapenem (INVANZ) 1 g in sodium chloride 0.9 % 50 mL IVPB     1 g 100 mL/hr over 30 Minutes Intravenous Every 24 hours 06/21/15 0935     06/21/15 1800  vancomycin (VANCOCIN) IVPB 750 mg/150 ml premix     750 mg 150 mL/hr over 60 Minutes Intravenous Every 24 hours 06/21/15 0935     06/21/15 1000  vancomycin (VANCOCIN) IVPB 750 mg/150 ml premix     750 mg 150 mL/hr over 60 Minutes Intravenous  Once 06/21/15 0935 06/21/15 1200   06/20/15 1600  ertapenem (INVANZ) 1 g in sodium chloride 0.9 % 50 mL IVPB  Status:  Discontinued     1 g 100 mL/hr over 30 Minutes Intravenous Every 24 hours 06/20/15 1314 06/21/15 0935      Medications   Scheduled Meds: . amLODipine  5 mg Oral Daily  . aspirin   81 mg Oral Daily  . chlorthalidone  25 mg Oral Daily  . cholecalciferol  2,000 Units Oral Daily  . ertapenem (INVANZ) IV  1 g Intravenous Q24H  . heparin  5,000 Units Subcutaneous 3 times per day  . hydrocortisone   Rectal TID  . latanoprost  1 drop Both Eyes QHS  . lidocaine  1 patch Transdermal Q24H  . multivitamin with minerals  1 tablet Oral Daily  . naproxen  250 mg Oral TID WC  . oxybutynin  10 mg Oral Daily  . pantoprazole  40 mg Oral Daily  . polyethylene glycol  17 g Oral QODAY  . sodium chloride  3 mL Intravenous Q12H  . sodium chloride  3 mL Intravenous Q12H  . trandolapril  1 mg Oral Daily  . triamcinolone cream  1 application Topical BID  . vancomycin  750 mg Intravenous Q24H   Continuous Infusions:  PRN Meds:.sodium chloride, acetaminophen **OR** acetaminophen, benzonatate, haloperidol lactate, HYDROcodone-homatropine, ondansetron **OR** ondansetron (ZOFRAN) IV, polyvinyl alcohol, pramoxine-mineral oil-zinc, sodium chloride   Data Review:   Micro Results Recent Results (from the past 240 hour(s))  Urine culture     Status: None   Collection Time: 06/13/15  2:08 PM  Result Value Ref Range Status   Specimen Description URINE, CLEAN CATCH  Final   Special Requests NONE  Final   Culture   Final    >=100,000 COLONIES/mL ESCHERICHIA COLI Results Called to: ANITA HARRIS AT 1049 ON 06/16/15 CTJ ESBL-EXTENDED SPECTRUM BETA LACTAMASE-THE ORGANISM IS RESISTANT TO PENICILLINS, CEPHALOSPORINS AND AZTREONAM ACCORDING TO CLSI M100-S15 VOL.25 N01 JAN 2005. >=100,000 COLONIES/mL AEROCOCCUS URINAE    Report Status 06/18/2015 FINAL  Final   Organism ID, Bacteria ESCHERICHIA COLI  Final      Susceptibility   Escherichia coli - MIC*    AMPICILLIN >=32 RESISTANT Resistant     CEFAZOLIN >=64 RESISTANT Resistant  CEFTRIAXONE >=64 RESISTANT Resistant     CIPROFLOXACIN >=4 RESISTANT Resistant     GENTAMICIN <=1 SENSITIVE Sensitive     IMIPENEM <=0.25 SENSITIVE Sensitive      NITROFURANTOIN <=16 SENSITIVE Sensitive     TRIMETH/SULFA >=320 RESISTANT Resistant     Extended ESBL POSITIVE Resistant     PIP/TAZO Value in next row Sensitive      SENSITIVE<=4    ERTAPENEM Value in next row Sensitive      SENSITIVE<=0.5    LEVOFLOXACIN Value in next row Resistant      RESISTANT>=8    * >=100,000 COLONIES/mL ESCHERICHIA COLI  Culture, blood (routine x 2)     Status: None   Collection Time: 06/14/15  2:27 PM  Result Value Ref Range Status   Specimen Description BLOOD RIGHT ASSIST CONTROL  Final   Special Requests   Final    BOTTLES DRAWN AEROBIC AND ANAEROBIC 1CC AEROBIC,1CC ANAEROBIC   Culture NO GROWTH 5 DAYS  Final   Report Status 06/19/2015 FINAL  Final  MRSA PCR Screening     Status: None   Collection Time: 06/21/15 11:05 AM  Result Value Ref Range Status   MRSA by PCR NEGATIVE NEGATIVE Final    Comment:        The GeneXpert MRSA Assay (FDA approved for NASAL specimens only), is one component of a comprehensive MRSA colonization surveillance program. It is not intended to diagnose MRSA infection nor to guide or monitor treatment for MRSA infections.     Radiology Reports Ct Head Wo Contrast  06/20/2015  CLINICAL DATA:  Altered mental status. EXAM: CT HEAD WITHOUT CONTRAST TECHNIQUE: Contiguous axial images were obtained from the base of the skull through the vertex without intravenous contrast. COMPARISON:  01/31/2015. FINDINGS: Diffusely enlarged ventricles and subarachnoid spaces. Patchy white matter low density in both cerebral hemispheres. Stable old right thalamic lacunar infarct. No intracranial hemorrhage, mass lesion or CT evidence of acute infarction. The anterior aspect of the left middle nasal turbinate is pneumatized. Otherwise, unremarkable bones and included paranasal sinuses. IMPRESSION: 1. No acute abnormality. 2. Stable atrophy and chronic small vessel white matter ischemic changes. 3. Stable old right thalamic lacunar infarct.  Electronically Signed   By: Beckie Salts M.D.   On: 06/20/2015 10:05   Nm Gi Blood Loss  06/10/2015  CLINICAL DATA:  Lower gastrointestinal bleeding. EXAM: NUCLEAR MEDICINE GASTROINTESTINAL BLEEDING SCAN TECHNIQUE: Sequential abdominal images were obtained following intravenous administration of Tc-62m labeled red blood cells. RADIOPHARMACEUTICALS:  21.16 mCi Tc-79m in-vitro labeled red cells. COMPARISON:  None. FINDINGS: No abnormal accumulation is seen to suggest active gastrointestinal bleeding. IMPRESSION: No scintigraphic evidence of active gastrointestinal bleeding. Electronically Signed   By: Lupita Raider, M.D.   On: 06/10/2015 10:37   US Renal  06/20/2015  CLINICAL DATA:  Urinary tract infection without hematuria EXAM: RENAL / URINARY TRACT ULTRASOUND COMPLETE COMPARISON:  None. FINDINGS: Right Kidney: Length: 9.6 cm. Echogenicity within normal limits. No mass or hydronephrosis visualized. Left Kidney: Length: 9.6 cm. Echogenicity within normal limits. No mass or hydronephrosis visualized. Bladder: Decompressed by Foley catheter and therefore not evaluated IMPRESSION: Normal renal ultrasound Electronically Signed   By: Esperanza Heir M.D.   On: 06/20/2015 13:55   Dg Chest Portable 1 View  06/20/2015  CLINICAL DATA:  EMS states pt was diagnosed with a UTI approximately x1 week ago and was given abx, which has been completed. EMS reports pt has been agitated with staff more frequently and has requested  to been seen by a medical professional. Staff at patient is a nursing facility reports an elevated temperature, not noted currently. Patient is currently disoriented. EXAM: PORTABLE CHEST 1 VIEW COMPARISON:  06/14/2015 FINDINGS: Cardiac silhouette is normal in size and configuration. Aorta is mildly uncoiled. No mediastinal or hilar masses or evidence of adenopathy. Lungs are mildly hyperexpanded. Mild scarring and/ or subsegmental atelectasis at the lung bases is stable. Lungs are otherwise  clear. Bony thorax is demineralized. There are advanced arthropathic changes of both shoulder joints, stable. IMPRESSION: No acute cardiopulmonary disease. Electronically Signed   By: Amie Portland M.D.   On: 06/20/2015 09:34   Dg Chest Port 1 View  06/14/2015  CLINICAL DATA:  Cough. EXAM: PORTABLE CHEST 1 VIEW COMPARISON:  June 03, 2012. FINDINGS: The heart size and mediastinal contours are within normal limits. No pneumothorax or pleural effusion is noted. Mild bibasilar subsegmental atelectasis is noted. Mild degenerative changes seen involving both glenohumeral joints. IMPRESSION: Mild bibasilar subsegmental atelectasis. Electronically Signed   By: Lupita Raider, M.D.   On: 06/14/2015 14:08   Dg Abd 2 Views  06/13/2015  CLINICAL DATA:  Subsequent encounter for several day history of rectal bleeding. EXAM: ABDOMEN - 2 VIEW COMPARISON:  None. FINDINGS: Upright film shows some probable atelectasis at the left lung base. No evidence for intraperitoneal free air. Supine film shows no gaseous bowel dilatation to suggest obstruction. Bones are diffusely demineralized. IMPRESSION: Negative. Electronically Signed   By: Kennith Center M.D.   On: 06/13/2015 08:03     CBC  Recent Labs Lab 06/20/15 0401 06/20/15 1510 06/21/15 0828  WBC 9.2 10.0 8.6  HGB 11.2* 11.1* 11.3*  HCT 33.5* 33.8* 33.0*  PLT 321 336 330  MCV 90.2 90.5 89.9  MCH 30.2 29.8 30.9  MCHC 33.5 32.9 34.4  RDW 14.1 13.9 14.1  LYMPHSABS  --   --  1.4  MONOABS  --   --  0.6  EOSABS  --   --  0.2  BASOSABS  --   --  0.1    Chemistries   Recent Labs Lab 06/20/15 0401 06/20/15 1510 06/21/15 0828  NA 139  --  140  K 3.2*  --  3.6  CL 105  --  105  CO2 26  --  24  GLUCOSE 103*  --  88  BUN 22*  --  18  CREATININE 0.78 0.61 0.66  CALCIUM 9.3  --  9.2  AST 22  --   --   ALT 15  --   --   ALKPHOS 75  --   --   BILITOT 0.5  --   --     ------------------------------------------------------------------------------------------------------------------ estimated creatinine clearance is 35.8 mL/min (by C-G formula based on Cr of 0.66). ------------------------------------------------------------------------------------------------------------------ No results for input(s): HGBA1C in the last 72 hours. ------------------------------------------------------------------------------------------------------------------ No results for input(s): CHOL, HDL, LDLCALC, TRIG, CHOLHDL, LDLDIRECT in the last 72 hours. ------------------------------------------------------------------------------------------------------------------  Recent Labs  06/20/15 1510  TSH 1.660   ------------------------------------------------------------------------------------------------------------------ No results for input(s): VITAMINB12, FOLATE, FERRITIN, TIBC, IRON, RETICCTPCT in the last 72 hours.  Coagulation profile No results for input(s): INR, PROTIME in the last 168 hours.  No results for input(s): DDIMER in the last 72 hours.  Cardiac Enzymes  Recent Labs Lab 06/20/15 1510 06/20/15 1904 06/21/15 0138  TROPONINI 0.21* 0.21* 0.22*   ------------------------------------------------------------------------------------------------------------------ Invalid input(s): POCBNP    Assessment & Plan   IMPRESSION AND PLAN: Patient is a 81 year old white female who was sent  from skilled nursing facility for agitation  1. Acute encephalopathy due to urinary tract infection, now improved  2. Recurrent urinary tract infection case discussed with Dr. Sampson Goon of infectious disease, patient was noted to have ESBL Escherichia coli as well as another organism Aerococcus. Await urine culture to decide on the final antibiotics.  3. Hypertension: Continue amlodipine  4. Elevated troponin likely due to demand ischemia awake echocardiogram of the  heart  5. CODE STATUS is DO NOT RESUSCITATE     Code Status Orders        Start     Ordered   06/20/15 1315  Do not attempt resuscitation (DNR)   Continuous    Question Answer Comment  In the event of cardiac or respiratory ARREST Do not call a "code blue"   In the event of cardiac or respiratory ARREST Do not perform Intubation, CPR, defibrillation or ACLS   In the event of cardiac or respiratory ARREST Use medication by any route, position, wound care, and other measures to relive pain and suffering. May use oxygen, suction and manual treatment of airway obstruction as needed for comfort.      06/20/15 1314    Advance Directive Documentation        Most Recent Value   Type of Advance Directive  Healthcare Power of Attorney   Pre-existing out of facility DNR order (yellow form or pink MOST form)     "MOST" Form in Place?                DVT Prophylaxis  heparin  Lab Results  Component Value Date   PLT 330 06/21/2015     Time Spent in minutes   32 minutes  Greater than 50% of time spent in care coordination and counseling.   Auburn Bilberry M.D on 06/22/2015 at 11:56 AM  Between 7am to 6pm - Pager - [phone]  After 6pm go to [email or web address] - password [email or web address] [facility]  [facility]   Office  [phone]

## 2015-06-22 NOTE — Progress Notes (Signed)
Physical Therapy Treatment Patient Details Name: [name] MRN: [id number] DOB: [birth year] Today's Date: 06/22/2015    History of Present Illness Pt was recently admitted with complaints of bright red blood per rectum and hypokalemia. She was discharged to [facility] but returned shortly after admission due to agitation and pulling out her lines. PMH: macular degeneraltion, osteoporosis, HTN, lumbar spinal stenosis, hyperlipidemia, and bilateral leg wounds. Pt is AOx4 at time of evaluation. History supplemented by friend who is present for evaluation. Pt was previously independent with rolling walker for limited facility ambulation.     PT Comments    Pt performs all bed exercises as instructed today demonstrating considerable weakness. However she is highly motivated and able to complete. Following bed exercises encouraged pt to continue with bed mobility and transfers however she refuses at this time. Pt reports that she is too fatigued to continue with therapy currently but agrees to participate during next therapy session. At next session will continue to progress bed mobility and transfers with the goal of restoring ambulation ability. Pt was evaluated yesterday and was too weak to complete transfer with maxA+2. Pt will benefit from skilled PT services to address deficits in strength, balance, and mobility in order to return to full function at home.    Follow Up Recommendations  SNF     Equipment Recommendations  None recommended by PT    Recommendations for Other Services       Precautions / Restrictions Precautions Precautions: Fall Restrictions Weight Bearing Restrictions: No    Mobility  Bed Mobility               General bed mobility comments: Pt refuses bed mobility and transfers on this date  Transfers                    Ambulation/Gait                 Stairs            Wheelchair Mobility    Modified Rankin (Stroke  Patients Only)       Balance                                    Cognition Arousal/Alertness: Awake/alert Behavior During Therapy: WFL for tasks assessed/performed;Agitated Overall Cognitive Status: History of cognitive impairments - at baseline                      Exercises General Exercises - Upper Extremity Elbow Flexion: Strengthening;Both;20 reps;Supine (minimal resistance) Elbow Extension: Strengthening;Both;20 reps;Supine General Exercises - Lower Extremity Ankle Circles/Pumps: Strengthening;Both;Supine;20 reps Quad Sets: Strengthening;Both;20 reps;Supine Gluteal Sets: Strengthening;Both;20 reps;Supine Short Arc Quad: Strengthening;Both;Supine;20 reps Heel Slides: Strengthening;Both;Supine;20 reps Hip ABduction/ADduction: Strengthening;Both;Supine;20 reps Straight Leg Raises: Strengthening;Both;Supine;20 reps    General Comments        Pertinent Vitals/Pain Pain Assessment: No/denies pain    Home Living                      Prior Function            PT Goals (current goals can now be found in the care plan section) Acute Rehab PT Goals Patient Stated Goal: "I want to be able to walk" PT Goal Formulation: With patient Time For Goal Achievement: 07/05/15 Potential to Achieve Goals: Fair Progress towards PT goals: Not progressing toward goals - comment (  Fatigued today, unwilling to attempt mobility)    Frequency  Min 2X/week    PT Plan Current plan remains appropriate    Co-evaluation             End of Session   Activity Tolerance: Patient limited by fatigue Patient left: in bed;with call bell/phone within reach;with bed alarm set     Time: [id number] PT Time Calculation (min) (ACUTE ONLY): 29 min  Charges:  $Therapeutic Exercise: 23-37 mins                    G Codes:      Sharalyn InkJason D Huprich PT, DPT   Huprich,Jason 06/22/2015, 9:49 AM

## 2015-06-22 NOTE — Progress Notes (Addendum)
   SUBJECTIVE: Pt currently receiving PT. More A&O than yesterday. No CP, SOB, PND, orthopnea or edema.    Filed Vitals:   06/21/15 1131 06/21/15 2001 06/22/15 0633 06/22/15 0938  BP: 128/47 132/82 153/66 119/45  Pulse: 66 77 66 85  Temp:  97.6 F (36.4 C) 97.4 F (36.3 C) 97.9 F (36.6 C)  TempSrc:  Oral Oral Oral  Resp: 16 16 18 18   Height:      Weight:      SpO2: 97% 91% 100% 93%    Intake/Output Summary (Last 24 hours) at 06/22/15 1020 Last data filed at 06/21/15 1752  Gross per 24 hour  Intake    200 ml  Output    600 ml  Net   -400 ml    LABS: Basic Metabolic Panel:  Recent Labs  16/05/9609/24/16 0401 06/20/15 1510 06/21/15 0828  NA 139  --  140  K 3.2*  --  3.6  CL 105  --  105  CO2 26  --  24  GLUCOSE 103*  --  88  BUN 22*  --  18  CREATININE 0.78 0.61 0.66  CALCIUM 9.3  --  9.2   Liver Function Tests:  Recent Labs  06/20/15 0401  AST 22  ALT 15  ALKPHOS 75  BILITOT 0.5  PROT 6.6  ALBUMIN 3.0*   No results for input(s): LIPASE, AMYLASE in the last 72 hours. CBC:  Recent Labs  06/20/15 1510 06/21/15 0828  WBC 10.0 8.6  NEUTROABS  --  6.2  HGB 11.1* 11.3*  HCT 33.8* 33.0*  MCV 90.5 89.9  PLT 336 330   Cardiac Enzymes:  Recent Labs  06/20/15 1510 06/20/15 1904 06/21/15 0138  TROPONINI 0.21* 0.21* 0.22*   BNP: Invalid input(s): POCBNP D-Dimer: No results for input(s): DDIMER in the last 72 hours. Hemoglobin A1C: No results for input(s): HGBA1C in the last 72 hours. Fasting Lipid Panel: No results for input(s): CHOL, HDL, LDLCALC, TRIG, CHOLHDL, LDLDIRECT in the last 72 hours. Thyroid Function Tests:  Recent Labs  06/20/15 1510  TSH 1.660   Anemia Panel: No results for input(s): VITAMINB12, FOLATE, FERRITIN, TIBC, IRON, RETICCTPCT in the last 72 hours.   PHYSICAL EXAM General: Well developed, well nourished, in no acute distress HEENT:  Normocephalic and atramatic Neck:  No JVD.  Lungs: Clear bilaterally to auscultation  and percussion. Heart: HRRR . Normal S1 and S2 without gallops or murmurs.  Abdomen: Bowel sounds are positive, abdomen soft and non-tender  Msk:  Back normal, normal gait. Normal strength and tone for age. Extremities: No clubbing, cyanosis or edema.   Neuro: Alert and oriented X 3. Psych:  Good affect, responds appropriately  TELEMETRY: Reviewed telemetry pt in [name]  ASSESSMENT AND PLAN:  1. Mildly elevated troponin 2/2 demand ischemia. No CP or EKG changes. Echo shows normal LVEF and WM   Pt given f/u in office 11/8 at 2pm.    Patient and plan discussed with supervising provider, Dr. Adrian BlackwaterShaukat Khan, who agrees with above findings.   Alinda SierrasEileen A. Margarito CourserRomano, PA-C [facility]  06/22/2015 10:20 AM

## 2015-06-22 NOTE — Progress Notes (Signed)
Patient evaluated by PT with recommendation for SNF.  Patient continues to express wanting to return to [facility].  CSW met with patient's HPOA [name] [phone], he would like patient to return to [facility] but understands his options are limited based on her current bed offers.  Only SNF offer at this time is [facility]  [name] is willing to take this offer if patient is unable to return to [place] spoke with [place] [facility] [place] is willing to come evaluate patient tomorrow to determine if they are able to meet her needs if she is not on IV antibiotics.   CSW will continue to follow patient and assist with ongoing and disposition needs  Casimer Lanius. Latanya Presser, MSW Clinical Social Work Department [phone] 11:49 AM

## 2015-06-22 NOTE — Progress Notes (Signed)
Initial Nutrition Assessment     INTERVENTION:  Meals and snacks: Cater to pt prefernces Medical Nutrition Supplement Therapy: Will add mightyshake TID for added nutrition   NUTRITION DIAGNOSIS:   Inadequate oral intake related to acute illness as evidenced by per patient/family report.    GOAL:   Patient will meet greater than or equal to 90% of their needs    MONITOR:    (Energy intake, Electrolyte and renal profile, Anthropometric)  REASON FOR ASSESSMENT:   Malnutrition Screening Tool    ASSESSMENT:    Pt admitted with AMS, UTI  Past Medical History  Diagnosis Date  . Macular degeneration   . Osteoporosis   . Hypertension   . Spinal stenosis of lumbar region   . Hyperlipidemia   . GI bleed     Current Nutrition: ate few bites of breakfast and few bites of lunch (corn and yogurt), tray observed.  Requesting milk that this Clinical research associate provided and served to pt.  Pt only took few sips.    Food/Nutrition-Related History: Pt reports poor po intake but unable to tell this writer how long appetite has been down   Scheduled Medications:  . amLODipine  5 mg Oral Daily  . aspirin  81 mg Oral Daily  . chlorthalidone  25 mg Oral Daily  . cholecalciferol  2,000 Units Oral Daily  . ertapenem (INVANZ) IV  1 g Intravenous Q24H  . heparin  5,000 Units Subcutaneous 3 times per day  . hydrocortisone   Rectal TID  . latanoprost  1 drop Both Eyes QHS  . lidocaine  1 patch Transdermal Q24H  . multivitamin with minerals  1 tablet Oral Daily  . naproxen  250 mg Oral TID WC  . oxybutynin  10 mg Oral Daily  . pantoprazole  40 mg Oral Daily  . polyethylene glycol  17 g Oral QODAY  . sodium chloride  3 mL Intravenous Q12H  . sodium chloride  3 mL Intravenous Q12H  . trandolapril  1 mg Oral Daily  . triamcinolone cream  1 application Topical BID  . vancomycin  750 mg Intravenous Q24H      Electrolyte/Renal Profile and Glucose Profile:   Recent Labs Lab 06/20/15 0401  06/20/15 1510 06/21/15 0828  NA 139  --  140  K 3.2*  --  3.6  CL 105  --  105  CO2 26  --  24  BUN 22*  --  18  CREATININE 0.78 0.61 0.66  CALCIUM 9.3  --  9.2  GLUCOSE 103*  --  88   Protein Profile:  Recent Labs Lab 06/20/15 0401  ALBUMIN 3.0*    Gastrointestinal Profile: Last BM: 10/23   Nutrition-Focused Physical Exam Findings:   Unable to complete Nutrition-Focused physical exam at this time.    Weight Change: per wt encounters 13% weight loss in the last 4 months 01/31/15 wt of 165 pounds,  06/09/15 wt of 153 pounds 06/20/15 wt of 144 pounds   Diet Order:  Diet Heart Room service appropriate?: Yes; Fluid consistency:: Thin  Skin:   reviewed    Height:   Ht Readings from Last 1 Encounters:  06/20/15  (1.626 m)    Weight:   Wt Readings from Last 1 Encounters:  06/20/15 144 lb 14.4 oz (65.726 kg)     BMI:  Body mass index is 24.86 kg/(m^2).  Estimated Nutritional Needs:   Kcal:  BEE 1010 kcals (IF 1.0-1.2, AF 1.3)1313-1575 kcals/d.   Protein:  (1.0-1.3 g/kg)  52-68 g/d  Fluid:  (30-3235ml/kg) 1560-1810120ml/d  EDUCATION NEEDS:   No education needs identified at this time  MODERATE Care Level  Clancy Mullarkey B. Freida BusmanAllen, RD, LDN [phone] (pager)

## 2015-06-22 NOTE — Progress Notes (Signed)
[facility] Date of Admission:  06/20/2015     ID: [name] is a 81 y.o. female with complicated UTI with [facility] aerococcus  Active Problems:   Acute encephalopathy   Subjective: No fevers, had urinary retention so foley replaced  ROS  Eleven systems are reviewed and negative except per hpi  Medications:  Antibiotics Given (last 72 hours)    Date/Time Action Medication Dose Rate   06/20/15 1719 Given   ertapenem (INVANZ) 1 g in sodium chloride 0.9 % 50 mL IVPB 1 g 100 mL/hr   06/21/15 1100 Given   vancomycin (VANCOCIN) IVPB 750 mg/150 ml premix 750 mg 150 mL/hr   06/21/15 1706 Given   ertapenem (INVANZ) 1 g in sodium chloride 0.9 % 50 mL IVPB 1 g 100 mL/hr   06/21/15 1752 Given   vancomycin (VANCOCIN) IVPB 750 mg/150 ml premix 750 mg 150 mL/hr     . amLODipine  5 mg Oral Daily  . aspirin  81 mg Oral Daily  . chlorthalidone  25 mg Oral Daily  . cholecalciferol  2,000 Units Oral Daily  . ertapenem (INVANZ) IV  1 g Intravenous Q24H  . heparin  5,000 Units Subcutaneous 3 times per day  . hydrocortisone   Rectal TID  . latanoprost  1 drop Both Eyes QHS  . lidocaine  1 patch Transdermal Q24H  . multivitamin with minerals  1 tablet Oral Daily  . naproxen  250 mg Oral TID WC  . oxybutynin  10 mg Oral Daily  . pantoprazole  40 mg Oral Daily  . polyethylene glycol  17 g Oral QODAY  . sodium chloride  3 mL Intravenous Q12H  . sodium chloride  3 mL Intravenous Q12H  . trandolapril  1 mg Oral Daily  . triamcinolone cream  1 application Topical BID  . vancomycin  750 mg Intravenous Q24H    Objective: Vital signs in last 24 hours: Temp:  [97.4 F (36.3 C)-98.3 F (36.8 C)] 98.3 F (36.8 C) (10/26 1144) Pulse Rate:  [66-87] 87 (10/26 1144) Resp:  [16-19] 19 (10/26 1144) BP: (119-153)/(45-82) 134/54 mmHg (10/26 1144) SpO2:  [91 %-100 %] 95 % (10/26 1144) Constitutional: Thin frail, sleepy HENT: [place]/AT, PERRLA, no scleral  icterus Mouth/Throat: Oropharynx is clear and dry. No oropharyngeal exudate.  Cardiovascular: distant  Pulmonary/Chest: Effort normal and breath sounds normal. No respiratory distress. has no wheezes.  Neck supple, no nuchal rigidity Abdominal: Soft. Bowel sounds are normal. exhibits no distension. There is no tenderness.  Lymphadenopathy: no cervical adenopathy. No axillary adenopathy Neurological: alert and oriented to person, place, and time.  Skin: bil legs in uawraps Psychiatric: flat affect,   Lab Results  Recent Labs  06/20/15 0401 06/20/15 1510 06/21/15 0828  WBC 9.2 10.0 8.6  HGB 11.2* 11.1* 11.3*  HCT 33.5* 33.8* 33.0*  NA 139  --  140  K 3.2*  --  3.6  CL 105  --  105  CO2 26  --  24  BUN 22*  --  18  CREATININE 0.78 0.61 0.66    Microbiology: Results for orders placed or performed during the hospital encounter of 06/20/15  MRSA PCR Screening     Status: None   Collection Time: 06/21/15 11:05 AM  Result Value Ref Range Status   MRSA by PCR NEGATIVE NEGATIVE Final    Comment:        The GeneXpert MRSA Assay (FDA approved for NASAL specimens only), is one  component of a comprehensive MRSA colonization surveillance program. It is not intended to diagnose MRSA infection nor to guide or monitor treatment for MRSA infections.     Studies/Results: No results found.  Assessment/Plan: [name] is a 81 y.o. female with a history of recent GI bleed, macular degeneration, hypertension, spinal stenosis and hyperlipidemia who was recently hospitalized for GI bleed. At that time was noted to have urinary retention and urinary tract infection. She was growing ESBL Escherichia coli as well as aerococcus. She was discharged with picc for ertapenem. Patient was readmitted 10.24 from the skilled nursing facility for agitation she been trying to pull the PICC line out. Repeat UA still showed TNTC wbc. Her WBC is nml, no fevers. Renal USS is nml. OF note  her ESBL E coli was sensitive to macrobid and her renal rxn is nml. She had to have foley replaced apparently for urinary retention. Recommendations 10/24  UCX -pending-  UA with TNTC WBC - if aerococcus grows will ask lab to do sensitivity testing on it Cont vanco and ertapenem for now I hope we will get growth on her most recent cx. If not then safest thing to do would be to treat her with 10 days vanco and ertapenem and I can see in 2 weeks to ensure clearance.  Thank you very much for the consult. Will follow with you.  Deaaron Fulghum   06/22/2015, 2:43 PM

## 2015-06-23 LAB — CREATININE, SERUM
Creatinine, Ser: 0.65 mg/dL (ref 0.44–1.00)
GFR calc Af Amer: >60 mL/min (ref >60)
GFR calc non Af Amer: >60 mL/min (ref >60)

## 2015-06-23 MED ORDER — POLYETHYLENE GLYCOL 3350 17 G PO PACK
17.0000 g | PACK | Freq: Every day | ORAL | Status: DC
  Administered 2015-06-24 – 2015-06-28 (×4): 17 g via ORAL
  Filled 2015-06-23 (×4): qty 1

## 2015-06-23 MED ORDER — QUETIAPINE FUMARATE ER 50 MG PO TB24
50.0000 mg | ORAL_TABLET | Freq: Every day | ORAL | Status: DC
  Administered 2015-06-23: 50 mg via ORAL
  Filled 2015-06-23 (×2): qty 1

## 2015-06-23 MED ORDER — SENNOSIDES-DOCUSATE SODIUM 8.6-50 MG PO TABS
1.0000 | ORAL_TABLET | Freq: Two times a day (BID) | ORAL | Status: DC
  Administered 2015-06-23 – 2015-06-28 (×11): 1 via ORAL
  Filled 2015-06-23 (×11): qty 1

## 2015-06-23 NOTE — Progress Notes (Signed)
Pt confused and agitated. Pulling telemetry off. Also pulling on foley.

## 2015-06-23 NOTE — Progress Notes (Signed)
Patient evaluated by PT, they continue to recommend SNF.  Patient is max assist.  Spoke with [facility], they strongly believe they are unable to meet patient's needs. Asked CSW to call in the AM with any changes.    Call to patient's POA Angelene GiovanniRodger  [phone] provide an additional bed offer from [facility]. [facility]  Left message for him to call CSW.   Clinical Social Worker will continue to follow patient to assist with ongoing and disposition needs.  Sammuel Hineseborah Niara Bunker. Theresia MajorsLCSWA, MSW Clinical Social Work Department [phone] 12:31 PM

## 2015-06-23 NOTE — Progress Notes (Signed)
ANTIBIOTIC CONSULT NOTE - INITIAL  Pharmacy Consult for Vancomycin (Day 3) Indication: UTI  Allergies  Allergen Reactions  . Celebrex [Celecoxib]     Patient Measurements: Height: 5\' 4"  (162.6 cm) Weight: 144 lb 14.4 oz (65.726 kg) IBW/kg (Calculated) : 54.7 Adjusted Body Weight: 59.1  Vital Signs: Temp: 97.9 F (36.6 C) (10/27 0746) Temp Source: Oral (10/27 0746) BP: 129/62 mmHg (10/27 0746) Pulse Rate: 81 (10/27 0746)  Labs:  Recent Labs  06/20/15 1510 06/21/15 0828 06/23/15 0426  WBC 10.0 8.6  --   HGB 11.1* 11.3*  --   PLT 336 330  --   CREATININE 0.61 0.66 0.65   Estimated Creatinine Clearance: 35.8 mL/min (by C-G formula based on Cr of 0.65). No results for input(s): VANCOTROUGH, VANCOPEAK, VANCORANDOM, GENTTROUGH, GENTPEAK, GENTRANDOM, TOBRATROUGH, TOBRAPEAK, TOBRARND, AMIKACINPEAK, AMIKACINTROU, AMIKACIN in the last 72 hours.   Microbiology: Recent Results (from the past 720 hour(s))  Urine culture     Status: None   Collection Time: 06/13/15  2:08 PM  Result Value Ref Range Status   Specimen Description URINE, CLEAN CATCH  Final   Special Requests NONE  Final   Culture   Final    >=100,000 COLONIES/mL ESCHERICHIA COLI Results Called to: ANITA HARRIS AT 1049 ON 06/16/15 CTJ ESBL-EXTENDED SPECTRUM BETA LACTAMASE-THE ORGANISM IS RESISTANT TO PENICILLINS, CEPHALOSPORINS AND AZTREONAM ACCORDING TO CLSI M100-S15 VOL.25 N01 JAN 2005. >=100,000 COLONIES/mL AEROCOCCUS URINAE    Report Status 06/18/2015 FINAL  Final   Organism ID, Bacteria ESCHERICHIA COLI  Final      Susceptibility   Escherichia coli - MIC*    AMPICILLIN >=32 RESISTANT Resistant     CEFAZOLIN >=64 RESISTANT Resistant     CEFTRIAXONE >=64 RESISTANT Resistant     CIPROFLOXACIN >=4 RESISTANT Resistant     GENTAMICIN <=1 SENSITIVE Sensitive     IMIPENEM <=0.25 SENSITIVE Sensitive     NITROFURANTOIN <=16 SENSITIVE Sensitive     TRIMETH/SULFA >=320 RESISTANT Resistant     Extended ESBL  POSITIVE Resistant     PIP/TAZO Value in next row Sensitive      SENSITIVE<=4    ERTAPENEM Value in next row Sensitive      SENSITIVE<=0.5    LEVOFLOXACIN Value in next row Resistant      RESISTANT>=8    * >=100,000 COLONIES/mL ESCHERICHIA COLI  Culture, blood (routine x 2)     Status: None   Collection Time: 06/14/15  2:27 PM  Result Value Ref Range Status   Specimen Description BLOOD RIGHT ASSIST CONTROL  Final   Special Requests   Final    BOTTLES DRAWN AEROBIC AND ANAEROBIC 1CC AEROBIC,1CC ANAEROBIC   Culture NO GROWTH 5 DAYS  Final   Report Status 06/19/2015 FINAL  Final  Urine culture     Status: None (Preliminary result)   Collection Time: 06/20/15  4:25 AM  Result Value Ref Range Status   Specimen Description URINE, CATHETERIZED  Final   Special Requests vanco ertapenem  Final   Culture HOLDING FOR POSSIBLE PATHOGEN  Final   Report Status PENDING  Incomplete  MRSA PCR Screening     Status: None   Collection Time: 06/21/15 11:05 AM  Result Value Ref Range Status   MRSA by PCR NEGATIVE NEGATIVE Final    Comment:        The GeneXpert MRSA Assay (FDA approved for NASAL specimens only), is one component of a comprehensive MRSA colonization surveillance program. It is not intended to diagnose MRSA infection nor to  guide or monitor treatment for MRSA infections.    Assessment: Pharmacy consulted to dose vancomycin for 81 yo female admitted from nursing home with potentially resistant UTI. Patien ton ertapenem as outpatient for ESBL UTI, admitted with AMS and resistant UTI - aerococcus on previous urine culture. ID following. Currently on Day 3 of vancomycin, Day 7 of ertapenem  Goal of Therapy:  Vancomycin trough level 10-15 mcg/ml  Plan:  Will continue current orders for vancomycin  IV Q24H. Trough ordered for tomorrow, which should be at steady state.   Repeat urine cultures pending.   Pharmacy will continue to monitor and adjust per consult.    Garlon Hatchet, PharmD Clinical Pharmacist 06/23/2015 10:44 AM

## 2015-06-23 NOTE — Progress Notes (Signed)
Physical Therapy Treatment Patient Details Name: [name] MRN: [id number] DOB: 12-Aug-1913 Today's Date: 06/23/2015    History of Present Illness Pt was recently admitted with complaints of bright red blood per rectum and hypokalemia. She was discharged to [facility] but returned shortly after admission due to agitation and pulling out her lines. PMH: macular degeneraltion, osteoporosis, HTN, lumbar spinal stenosis, hyperlipidemia, and bilateral leg wounds. Pt is AOx4 at time of evaluation. History supplemented by friend who is present for evaluation. Pt was previously independent with rolling walker for limited facility ambulation.     PT Comments    Pt very agitated/argumentative this a.m. Pt initially attempting to get up from bed. All efforts made with assist of two to sit pt edge of bed and attempt stand or possibly up in bed. Pt's agitation inhibited ability to do so; therefore, after numerous attempts, pt returned safely to bed. Discussed session with MD. Continue PT as mental status allows for improved strength and safe functional mobility.  Follow Up Recommendations  SNF     Equipment Recommendations  None recommended by PT    Recommendations for Other Services       Precautions / Restrictions Precautions Precautions: Fall Restrictions Weight Bearing Restrictions: No    Mobility  Bed Mobility Overal bed mobility: Needs Assistance Bed Mobility: Sit to Supine;Supine to Sit     Supine to sit: Max assist;+2 for physical assistance;+2 for safety/equipment Sit to supine: Max assist;+2 for physical assistance;+2 for safety/equipment   General bed mobility comments: upon entering room; pt with BLEs off bed attempting to get up; unable to raise trunk at all but attempting. With assit of 2; pt argues to let her go to tell us how to do it or do it herself  Transfers Overall transfer level: Needs assistance               General transfer comment: pt states  she wants to stand, but unable to get her to full seated position with BLEs on the floor long enough due to agitated nature despite efforts.  Ambulation/Gait                 Stairs            Wheelchair Mobility    Modified Rankin (Stroke Patients Only)       Balance Overall balance assessment: Needs assistance Sitting-balance support: Bilateral upper extremity supported;Feet unsupported Sitting balance-[name] Scale: Poor Sitting balance - Comments: with continued efforts to maintain sitting; pt agitated and commands therapist/aide to let go of her; pt slowly retropulsive; repeated efforts numerous times                             Cognition Arousal/Alertness: Awake/alert Behavior During Therapy: Agitated;Restless (Argumentative/fiesty) Overall Cognitive Status: No family/caregiver present to determine baseline cognitive functioning                      Exercises      General Comments        Pertinent Vitals/Pain Pain Assessment: No/denies pain    Home Living                      Prior Function            PT Goals (current goals can now be found in the care plan section) Progress towards PT goals: Not progressing toward goals - comment  Frequency  Min 2X/week    PT Plan Current plan remains appropriate    Co-evaluation             End of Session   Activity Tolerance: Treatment limited secondary to agitation Patient left: in bed;in CPM;with bed alarm set (one foot rail up/moveable tray table placed on other side)     Time: 1030-1048 PT Time Calculation (min) (ACUTE ONLY): 18 min  Charges:  $Therapeutic Activity: 8-22 mins                    G Codes:      Kristeen MissHeidi Elizabeth Bishop 06/23/2015, 11:32 AM

## 2015-06-23 NOTE — Progress Notes (Signed)
SUBJECTIVE: Patient is a bit confused butno chest pain or shortness of breath   Filed Vitals:   06/22/15 1144 06/22/15 2015 06/23/15 0436 06/23/15 0746  BP: 134/54 131/47 130/54 129/62  Pulse: 87 58 91 81  Temp: 98.3 F (36.8 C) 97.7 F (36.5 C) 98 F (36.7 C) 97.9 F (36.6 C)  TempSrc:  Oral Oral Oral  Resp: 19 18 19 20   Height:      Weight:      SpO2: 95% 92% 91% 93%    Intake/Output Summary (Last 24 hours) at 06/23/15 0754 Last data filed at 06/22/15 1850  Gross per 24 hour  Intake    200 ml  Output    550 ml  Net   -350 ml    LABS: Basic Metabolic Panel:  Recent Labs  16/05/9609/25/16 0828 06/23/15 0426  NA 140  --   K 3.6  --   CL 105  --   CO2 24  --   GLUCOSE 88  --   BUN 18  --   CREATININE 0.66 0.65  CALCIUM 9.2  --    Liver Function Tests: No results for input(s): AST, ALT, ALKPHOS, BILITOT, PROT, ALBUMIN in the last 72 hours. No results for input(s): LIPASE, AMYLASE in the last 72 hours. CBC:  Recent Labs  06/20/15 1510 06/21/15 0828  WBC 10.0 8.6  NEUTROABS  --  6.2  HGB 11.1* 11.3*  HCT 33.8* 33.0*  MCV 90.5 89.9  PLT 336 330   Cardiac Enzymes:  Recent Labs  06/20/15 1510 06/20/15 1904 06/21/15 0138  TROPONINI 0.21* 0.21* 0.22*   BNP: Invalid input(s): POCBNP D-Dimer: No results for input(s): DDIMER in the last 72 hours. Hemoglobin A1C: No results for input(s): HGBA1C in the last 72 hours. Fasting Lipid Panel: No results for input(s): CHOL, HDL, LDLCALC, TRIG, CHOLHDL, LDLDIRECT in the last 72 hours. Thyroid Function Tests:  Recent Labs  06/20/15 1510  TSH 1.660   Anemia Panel: No results for input(s): VITAMINB12, FOLATE, FERRITIN, TIBC, IRON, RETICCTPCT in the last 72 hours.   PHYSICAL EXAM General: Well developed, well nourished, in no acute distress HEENT:  Normocephalic and atramatic Neck:  No JVD.  Lungs: Clear bilaterally to auscultation and percussion. Heart: HRRR . Normal S1 and S2 without gallops or murmurs.   Abdomen: Bowel sounds are positive, abdomen soft and non-tender  Msk:  Back normal, normal gait. Normal strength and tone for age. Extremities: No clubbing, cyanosis or edema.   Neuro: Alert and oriented X 3. Psych:  Good affect, responds appropriately  TELEMETRY: Sinus rhythm  ASSESSMENT AND PLAN: Patient had mildly elevated troponin with no chest pain, feeling well.  Active Problems:   Acute encephalopathy    Laurier NancyKHAN,Keilee Denman A, MD, [facility]edoFACC 06/23/2015 7:54 AM

## 2015-06-23 NOTE — Consult Note (Signed)
WOC wound follow up Wound type: DUke boot dressing change to bilateral lower extremities, per MD order. Monday and Thursday.  Dressing procedure/placement/frequency: LEgs cleansed with soap and water. Mepitel to wound beds, kerlix and Coban wrap to bilateral lower legs.  Change Monday and Thursday. No change.  Will not follow at this time.  Please re-consult if needed.  Maple HudsonKaren Mohogany Toppins RN BSN CWON Pager [phone]

## 2015-06-23 NOTE — Progress Notes (Signed)
[facility] [place] at [facility]                                                                                                                                                                                            Patient Demographics   [name], is a 81 y.o. female, DOB - Nov 06, 1912, ZOX:[id number]  Admit date - 06/20/2015   Admitting Physician Auburn Bilberry, MD  Outpatient Primary MD for the patient is Lauro Regulus., MD   LOS - 3  Subjective:  Very agitated today.  Review of Systems:   Unable to provide due to confusion  Vitals:   Filed Vitals:   06/22/15 2015 06/23/15 0436 06/23/15 0746 06/23/15 1156  BP: 131/47 130/54 129/62 120/52  Pulse: 58 91 81 70  Temp: 97.7 F (36.5 C) 98 F (36.7 C) 97.9 F (36.6 C) 97.7 F (36.5 C)  TempSrc: Oral Oral Oral Oral  Resp: 18 19 20 20   Height:      Weight:      SpO2: 92% 91% 93% 96%    Wt Readings from Last 3 Encounters:  06/20/15 65.726 kg (144 lb 14.4 oz)  06/09/15 69.718 kg (153 lb 11.2 oz)  01/31/15 74.844 kg (165 lb)     Intake/Output Summary (Last 24 hours) at 06/23/15 1251 Last data filed at 06/22/15 1850  Gross per 24 hour  Intake    200 ml  Output    550 ml  Net   -350 ml    Physical Exam:   GENERAL: Confused and agitated  HEAD, EYES, EARS, NOSE AND THROAT: Atraumatic, normocephalic. Extraocular muscles are intact. Pupils equal and reactive to light. Sclerae anicteric. No conjunctival injection. No oro-pharyngeal erythema.  NECK: Supple. There is no jugular venous distention. No bruits, no lymphadenopathy, no thyromegaly.  HEART: Regular rate and rhythm,. No murmurs, no rubs, no clicks.  LUNGS: Clear to auscultation bilaterally. No rales or rhonchi. No wheezes.  ABDOMEN: Soft, flat, nontender, nondistended. Has good bowel sounds. No hepatosplenomegaly appreciated.  EXTREMITIES: No evidence of any cyanosis, clubbing, or peripheral edema.  +2 pedal and radial pulses  bilaterally.  NEUROLOGIC: The patient is alert, awake, and oriented x3 with no focal motor or sensory deficits appreciated bilaterally.  SKIN: Moist and warm with no rashes appreciated.  Psych: Confused LN: No inguinal LN enlargement    Antibiotics   Anti-infectives    Start     Dose/Rate Route Frequency Ordered Stop   06/21/15 1800  ertapenem (INVANZ) 1 g in sodium chloride 0.9 % 50 mL IVPB     1 g 100 mL/hr over 30 Minutes  Intravenous Every 24 hours 06/21/15 0935     06/21/15 1800  vancomycin (VANCOCIN) IVPB 750 mg/150 ml premix     750 mg 150 mL/hr over 60 Minutes Intravenous Every 24 hours 06/21/15 0935     06/21/15 1000  vancomycin (VANCOCIN) IVPB 750 mg/150 ml premix     750 mg 150 mL/hr over 60 Minutes Intravenous  Once 06/21/15 0935 06/21/15 1200   06/20/15 1600  ertapenem (INVANZ) 1 g in sodium chloride 0.9 % 50 mL IVPB  Status:  Discontinued     1 g 100 mL/hr over 30 Minutes Intravenous Every 24 hours 06/20/15 1314 06/21/15 0935      Medications   Scheduled Meds: . amLODipine  5 mg Oral Daily  . aspirin  81 mg Oral Daily  . chlorthalidone  25 mg Oral Daily  . cholecalciferol  2,000 Units Oral Daily  . ertapenem (INVANZ) IV  1 g Intravenous Q24H  . heparin  5,000 Units Subcutaneous 3 times per day  . hydrocortisone   Rectal TID  . latanoprost  1 drop Both Eyes QHS  . lidocaine  1 patch Transdermal Q24H  . multivitamin with minerals  1 tablet Oral Daily  . naproxen  250 mg Oral TID WC  . oxybutynin  10 mg Oral Daily  . pantoprazole  40 mg Oral Daily  . polyethylene glycol  17 g Oral QODAY  . senna-docusate  1 tablet Oral BID  . sodium chloride  3 mL Intravenous Q12H  . sodium chloride  3 mL Intravenous Q12H  . trandolapril  1 mg Oral Daily  . triamcinolone cream  1 application Topical BID  . vancomycin  750 mg Intravenous Q24H   Continuous Infusions:  PRN Meds:.sodium chloride, acetaminophen **OR** acetaminophen, benzonatate, haloperidol lactate,  HYDROcodone-homatropine, ondansetron **OR** ondansetron (ZOFRAN) IV, polyvinyl alcohol, pramoxine-mineral oil-zinc, sodium chloride   Data Review:   Micro Results Recent Results (from the past 240 hour(s))  Urine culture     Status: None   Collection Time: 06/13/15  2:08 PM  Result Value Ref Range Status   Specimen Description URINE, CLEAN CATCH  Final   Special Requests NONE  Final   Culture   Final    >=100,000 COLONIES/mL ESCHERICHIA COLI Results Called to: ANITA HARRIS AT 1049 ON 06/16/15 CTJ ESBL-EXTENDED SPECTRUM BETA LACTAMASE-THE ORGANISM IS RESISTANT TO PENICILLINS, CEPHALOSPORINS AND AZTREONAM ACCORDING TO CLSI M100-S15 VOL.25 N01 JAN 2005. >=100,000 COLONIES/mL AEROCOCCUS URINAE    Report Status 06/18/2015 FINAL  Final   Organism ID, Bacteria ESCHERICHIA COLI  Final      Susceptibility   Escherichia coli - MIC*    AMPICILLIN >=32 RESISTANT Resistant     CEFAZOLIN >=64 RESISTANT Resistant     CEFTRIAXONE >=64 RESISTANT Resistant     CIPROFLOXACIN >=4 RESISTANT Resistant     GENTAMICIN <=1 SENSITIVE Sensitive     IMIPENEM <=0.25 SENSITIVE Sensitive     NITROFURANTOIN <=16 SENSITIVE Sensitive     TRIMETH/SULFA >=320 RESISTANT Resistant     Extended ESBL POSITIVE Resistant     PIP/TAZO Value in next row Sensitive      SENSITIVE<=4    ERTAPENEM Value in next row Sensitive      SENSITIVE<=0.5    LEVOFLOXACIN Value in next row Resistant      RESISTANT>=8    * >=100,000 COLONIES/mL ESCHERICHIA COLI  Culture, blood (routine x 2)     Status: None   Collection Time: 06/14/15  2:27 PM  Result Value Ref Range Status  Specimen Description BLOOD RIGHT ASSIST CONTROL  Final   Special Requests   Final    BOTTLES DRAWN AEROBIC AND ANAEROBIC 1CC AEROBIC,1CC ANAEROBIC   Culture NO GROWTH 5 DAYS  Final   Report Status 06/19/2015 FINAL  Final  Urine culture     Status: None (Preliminary result)   Collection Time: 06/20/15  4:25 AM  Result Value Ref Range Status   Specimen  Description URINE, CATHETERIZED  Final   Special Requests vanco ertapenem  Final   Culture HOLDING FOR POSSIBLE PATHOGEN  Final   Report Status PENDING  Incomplete  MRSA PCR Screening     Status: None   Collection Time: 06/21/15 11:05 AM  Result Value Ref Range Status   MRSA by PCR NEGATIVE NEGATIVE Final    Comment:        The GeneXpert MRSA Assay (FDA approved for NASAL specimens only), is one component of a comprehensive MRSA colonization surveillance program. It is not intended to diagnose MRSA infection nor to guide or monitor treatment for MRSA infections.     Radiology Reports Ct Head Wo Contrast  06/20/2015  CLINICAL DATA:  Altered mental status. EXAM: CT HEAD WITHOUT CONTRAST TECHNIQUE: Contiguous axial images were obtained from the base of the skull through the vertex without intravenous contrast. COMPARISON:  01/31/2015. FINDINGS: Diffusely enlarged ventricles and subarachnoid spaces. Patchy white matter low density in both cerebral hemispheres. Stable old right thalamic lacunar infarct. No intracranial hemorrhage, mass lesion or CT evidence of acute infarction. The anterior aspect of the left middle nasal turbinate is pneumatized. Otherwise, unremarkable bones and included paranasal sinuses. IMPRESSION: 1. No acute abnormality. 2. Stable atrophy and chronic small vessel white matter ischemic changes. 3. Stable old right thalamic lacunar infarct. Electronically Signed   By: Beckie Salts M.D.   On: 06/20/2015 10:05   Nm Gi Blood Loss  06/10/2015  CLINICAL DATA:  Lower gastrointestinal bleeding. EXAM: NUCLEAR MEDICINE GASTROINTESTINAL BLEEDING SCAN TECHNIQUE: Sequential abdominal images were obtained following intravenous administration of Tc-26m labeled red blood cells. RADIOPHARMACEUTICALS:  21.16 mCi Tc-23m in-vitro labeled red cells. COMPARISON:  None. FINDINGS: No abnormal accumulation is seen to suggest active gastrointestinal bleeding. IMPRESSION: No scintigraphic evidence  of active gastrointestinal bleeding. Electronically Signed   By: Lupita Raider, M.D.   On: 06/10/2015 10:37   US Renal  06/20/2015  CLINICAL DATA:  Urinary tract infection without hematuria EXAM: RENAL / URINARY TRACT ULTRASOUND COMPLETE COMPARISON:  None. FINDINGS: Right Kidney: Length: 9.6 cm. Echogenicity within normal limits. No mass or hydronephrosis visualized. Left Kidney: Length: 9.6 cm. Echogenicity within normal limits. No mass or hydronephrosis visualized. Bladder: Decompressed by Foley catheter and therefore not evaluated IMPRESSION: Normal renal ultrasound Electronically Signed   By: Esperanza Heir M.D.   On: 06/20/2015 13:55   Dg Chest Portable 1 View  06/20/2015  CLINICAL DATA:  EMS states pt was diagnosed with a UTI approximately x1 week ago and was given abx, which has been completed. EMS reports pt has been agitated with staff more frequently and has requested to been seen by a medical professional. Staff at patient is a nursing facility reports an elevated temperature, not noted currently. Patient is currently disoriented. EXAM: PORTABLE CHEST 1 VIEW COMPARISON:  06/14/2015 FINDINGS: Cardiac silhouette is normal in size and configuration. Aorta is mildly uncoiled. No mediastinal or hilar masses or evidence of adenopathy. Lungs are mildly hyperexpanded. Mild scarring and/ or subsegmental atelectasis at the lung bases is stable. Lungs are otherwise clear. Bony thorax  is demineralized. There are advanced arthropathic changes of both shoulder joints, stable. IMPRESSION: No acute cardiopulmonary disease. Electronically Signed   By: Amie Portland M.D.   On: 06/20/2015 09:34   Dg Chest Port 1 View  06/14/2015  CLINICAL DATA:  Cough. EXAM: PORTABLE CHEST 1 VIEW COMPARISON:  June 03, 2012. FINDINGS: The heart size and mediastinal contours are within normal limits. No pneumothorax or pleural effusion is noted. Mild bibasilar subsegmental atelectasis is noted. Mild degenerative changes seen  involving both glenohumeral joints. IMPRESSION: Mild bibasilar subsegmental atelectasis. Electronically Signed   By: Lupita Raider, M.D.   On: 06/14/2015 14:08   Dg Abd 2 Views  06/13/2015  CLINICAL DATA:  Subsequent encounter for several day history of rectal bleeding. EXAM: ABDOMEN - 2 VIEW COMPARISON:  None. FINDINGS: Upright film shows some probable atelectasis at the left lung base. No evidence for intraperitoneal free air. Supine film shows no gaseous bowel dilatation to suggest obstruction. Bones are diffusely demineralized. IMPRESSION: Negative. Electronically Signed   By: Kennith Center M.D.   On: 06/13/2015 08:03     CBC  Recent Labs Lab 06/20/15 0401 06/20/15 1510 06/21/15 0828  WBC 9.2 10.0 8.6  HGB 11.2* 11.1* 11.3*  HCT 33.5* 33.8* 33.0*  PLT 321 336 330  MCV 90.2 90.5 89.9  MCH 30.2 29.8 30.9  MCHC 33.5 32.9 34.4  RDW 14.1 13.9 14.1  LYMPHSABS  --   --  1.4  MONOABS  --   --  0.6  EOSABS  --   --  0.2  BASOSABS  --   --  0.1    Chemistries   Recent Labs Lab 06/20/15 0401 06/20/15 1510 06/21/15 0828 06/23/15 0426  NA 139  --  140  --   K 3.2*  --  3.6  --   CL 105  --  105  --   CO2 26  --  24  --   GLUCOSE 103*  --  88  --   BUN 22*  --  18  --   CREATININE 0.78 0.61 0.66 0.65  CALCIUM 9.3  --  9.2  --   AST 22  --   --   --   ALT 15  --   --   --   ALKPHOS 75  --   --   --   BILITOT 0.5  --   --   --    ------------------------------------------------------------------------------------------------------------------ estimated creatinine clearance is 35.8 mL/min (by C-G formula based on Cr of 0.65). ------------------------------------------------------------------------------------------------------------------ No results for input(s): HGBA1C in the last 72 hours. ------------------------------------------------------------------------------------------------------------------ No results for input(s): CHOL, HDL, LDLCALC, TRIG, CHOLHDL, LDLDIRECT  in the last 72 hours. ------------------------------------------------------------------------------------------------------------------  Recent Labs  06/20/15 1510  TSH 1.660   ------------------------------------------------------------------------------------------------------------------ No results for input(s): VITAMINB12, FOLATE, FERRITIN, TIBC, IRON, RETICCTPCT in the last 72 hours.  Coagulation profile No results for input(s): INR, PROTIME in the last 168 hours.  No results for input(s): DDIMER in the last 72 hours.  Cardiac Enzymes  Recent Labs Lab 06/20/15 1510 06/20/15 1904 06/21/15 0138  TROPONINI 0.21* 0.21* 0.22*   ------------------------------------------------------------------------------------------------------------------ Invalid input(s): POCBNP    Assessment & Plan   IMPRESSION AND PLAN: Patient is a 81 year old white female who was sent from skilled nursing facility for agitation  1. Acute encephalopathy due to urinary tract infection,  patient mental status now worst again. We'll start her on some sera quill at nighttime  2. Recurrent urinary tract infection case discussed with Dr.  Fitzgerald of infectious disease, patient was noted to have ESBL Escherichia coli as well as another organism Aerococcus. Await urine culture to decide on the final antibiotics.  3. Hypertension: Continue amlodipine  4. Elevated troponin likely due to demand ischemia awake echocardiogram of the heart  5. CODE STATUS is DO NOT RESUSCITATE     Code Status Orders        Start     Ordered   06/20/15 1315  Do not attempt resuscitation (DNR)   Continuous    Question Answer Comment  In the event of cardiac or respiratory ARREST Do not call a "code blue"   In the event of cardiac or respiratory ARREST Do not perform Intubation, CPR, defibrillation or ACLS   In the event of cardiac or respiratory ARREST Use medication by any route, position, wound care, and other  measures to relive pain and suffering. May use oxygen, suction and manual treatment of airway obstruction as needed for comfort.      06/20/15 1314    Advance Directive Documentation        Most Recent Value   Type of Advance Directive  Healthcare Power of Attorney   Pre-existing out of facility DNR order (yellow form or pink MOST form)     "MOST" Form in Place?                DVT Prophylaxis  heparin  Lab Results  Component Value Date   PLT 330 06/21/2015     Time Spent in minutes  25 minutes   Auburn Bilberry M.D on 06/23/2015 at 12:51 PM  Between 7am to 6pm - Pager - [phone]  After 6pm go to [email or web address] - password [email or web address] [facility]  [facility]   Office  [phone]

## 2015-06-24 LAB — VANCOMYCIN, TROUGH: Vancomycin Tr: 13 ug/mL (ref 10–20)

## 2015-06-24 LAB — CBC
HEMATOCRIT: 33.5 % — AB (ref 35.0–47.0)
Hemoglobin: 11.3 g/dL — ABNORMAL LOW (ref 12.0–16.0)
MCH: 30.1 pg (ref 26.0–34.0)
MCHC: 33.6 g/dL (ref 32.0–36.0)
MCV: 89.5 fL (ref 80.0–100.0)
PLATELETS: 371 10*3/uL (ref 150–440)
RBC: 3.75 MIL/uL — AB (ref 3.80–5.20)
RDW: 14.3 % (ref 11.5–14.5)
WBC: 9.7 10*3/uL (ref 3.6–11.0)

## 2015-06-24 LAB — URINALYSIS COMPLETE WITH MICROSCOPIC (ARMC ONLY)
BACTERIA UA: NONE SEEN
BILIRUBIN URINE: NEGATIVE
GLUCOSE, UA: NEGATIVE mg/dL
HGB URINE DIPSTICK: NEGATIVE
Ketones, ur: NEGATIVE mg/dL
LEUKOCYTES UA: NEGATIVE
NITRITE: NEGATIVE
PH: 7 (ref 5.0–8.0)
Protein, ur: NEGATIVE mg/dL
SPECIFIC GRAVITY, URINE: 1.003 — AB (ref 1.005–1.030)
Squamous Epithelial / LPF: NONE SEEN

## 2015-06-24 MED ORDER — QUETIAPINE FUMARATE 25 MG PO TABS
25.0000 mg | ORAL_TABLET | Freq: Every day | ORAL | Status: DC
  Administered 2015-06-24 – 2015-06-27 (×4): 25 mg via ORAL
  Filled 2015-06-24 (×4): qty 1

## 2015-06-24 NOTE — Progress Notes (Signed)
Clinical Social Worker received return call from patient's nephew/ PAO Fredrik CoveRoger [phone]  he has selected [facility] for patient at discharge.   Call to [facility] to informed them patient will not discharge today.    Clinical Social Worker will continue to follow patient to assist with ongoing and disposition needs.  Sammuel Hineseborah Rolly Magri. Theresia MajorsLCSWA, MSW Clinical Social Work Department [phone] 1:12 PM

## 2015-06-24 NOTE — Progress Notes (Signed)
[facility] - [place] at [facility]                                                                                                                                                                                            Patient Demographics   [name], is a 81 y.o. female, DOB - 02/01/1913, ZOX:[id number]  Admit date - 06/20/2015   Admitting Physician Auburn Bilberry, MD  Outpatient Primary MD for the patient is Lauro Regulus., MD   LOS - 4  Subjective:  Started on Seroquel yesterday and now sleepy.  Review of Systems:   Due to being drowsy  Vitals:   Filed Vitals:   06/23/15 1156 06/23/15 2025 06/24/15 0533 06/24/15 1021  BP: 120/52 120/61 124/49 120/63  Pulse: 70 81 82 66  Temp: 97.7 F (36.5 C) 97.6 F (36.4 C) 97.9 F (36.6 C) 97.3 F (36.3 C)  TempSrc: Oral Oral Oral Oral  Resp: 20 18 18 17   Height:      Weight:      SpO2: 96% 93% 97% 97%    Wt Readings from Last 3 Encounters:  06/20/15 65.726 kg (144 lb 14.4 oz)  06/09/15 69.718 kg (153 lb 11.2 oz)  01/31/15 74.844 kg (165 lb)     Intake/Output Summary (Last 24 hours) at 06/24/15 1150 Last data filed at 06/24/15 0052  Gross per 24 hour  Intake      0 ml  Output    800 ml  Net   -800 ml    Physical Exam:   GENERAL: Confused and agitated  HEAD, EYES, EARS, NOSE AND THROAT: Atraumatic, normocephalic. Extraocular muscles are intact. Pupils equal and reactive to light. Sclerae anicteric. No conjunctival injection. No oro-pharyngeal erythema.  NECK: Supple. There is no jugular venous distention. No bruits, no lymphadenopathy, no thyromegaly.  HEART: Regular rate and rhythm,. No murmurs, no rubs, no clicks.  LUNGS: Clear to auscultation bilaterally. No rales or rhonchi. No wheezes.  ABDOMEN: Soft, flat, nontender, nondistended. Has good bowel sounds. No hepatosplenomegaly appreciated.  EXTREMITIES: No evidence of any cyanosis, clubbing, or peripheral edema.  +2 pedal and  radial pulses bilaterally.  NEUROLOGIC: The patient is alert, awake, and oriented x3 with no focal motor or sensory deficits appreciated bilaterally.  SKIN: Moist and warm with no rashes appreciated.  Psych: Sleepy LN: No inguinal LN enlargement    Antibiotics   Anti-infectives    Start     Dose/Rate Route Frequency Ordered Stop   06/21/15 1800  ertapenem (INVANZ) 1 g in sodium chloride 0.9 % 50 mL IVPB     1 g 100  mL/hr over 30 Minutes Intravenous Every 24 hours 06/21/15 0935     06/21/15 1800  vancomycin (VANCOCIN) IVPB 750 mg/150 ml premix     750 mg 150 mL/hr over 60 Minutes Intravenous Every 24 hours 06/21/15 0935     06/21/15 1000  vancomycin (VANCOCIN) IVPB 750 mg/150 ml premix     750 mg 150 mL/hr over 60 Minutes Intravenous  Once 06/21/15 0935 06/21/15 1200   06/20/15 1600  ertapenem (INVANZ) 1 g in sodium chloride 0.9 % 50 mL IVPB  Status:  Discontinued     1 g 100 mL/hr over 30 Minutes Intravenous Every 24 hours 06/20/15 1314 06/21/15 0935      Medications   Scheduled Meds: . amLODipine  5 mg Oral Daily  . aspirin  81 mg Oral Daily  . chlorthalidone  25 mg Oral Daily  . cholecalciferol  2,000 Units Oral Daily  . ertapenem (INVANZ) IV  1 g Intravenous Q24H  . heparin  5,000 Units Subcutaneous 3 times per day  . hydrocortisone   Rectal TID  . latanoprost  1 drop Both Eyes QHS  . lidocaine  1 patch Transdermal Q24H  . multivitamin with minerals  1 tablet Oral Daily  . naproxen  250 mg Oral TID WC  . oxybutynin  10 mg Oral Daily  . pantoprazole  40 mg Oral Daily  . polyethylene glycol  17 g Oral Daily  . QUEtiapine  25 mg Oral QHS  . senna-docusate  1 tablet Oral BID  . sodium chloride  3 mL Intravenous Q12H  . sodium chloride  3 mL Intravenous Q12H  . trandolapril  1 mg Oral Daily  . triamcinolone cream  1 application Topical BID  . vancomycin  750 mg Intravenous Q24H   Continuous Infusions:  PRN Meds:.sodium chloride, acetaminophen **OR** acetaminophen,  benzonatate, haloperidol lactate, HYDROcodone-homatropine, ondansetron **OR** ondansetron (ZOFRAN) IV, polyvinyl alcohol, pramoxine-mineral oil-zinc, sodium chloride   Data Review:   Micro Results Recent Results (from the past 240 hour(s))  Culture, blood (routine x 2)     Status: None   Collection Time: 06/14/15  2:27 PM  Result Value Ref Range Status   Specimen Description BLOOD RIGHT ASSIST CONTROL  Final   Special Requests   Final    BOTTLES DRAWN AEROBIC AND ANAEROBIC 1CC AEROBIC,1CC ANAEROBIC   Culture NO GROWTH 5 DAYS  Final   Report Status 06/19/2015 FINAL  Final  Urine culture     Status: None (Preliminary result)   Collection Time: 06/20/15  4:25 AM  Result Value Ref Range Status   Specimen Description URINE, CATHETERIZED  Final   Special Requests vanco ertapenem  Final   Culture   Final    100 COLONIES/mL GRAM NEGATIVE RODS IDENTIFICATION AND SUSCEPTIBILITIES TO FOLLOW 80,000 COLONIES/ml BUDDING YEAST SEEN  ID TO FOLLOW ONCE MORE GROWTH    Report Status PENDING  Incomplete  MRSA PCR Screening     Status: None   Collection Time: 06/21/15 11:05 AM  Result Value Ref Range Status   MRSA by PCR NEGATIVE NEGATIVE Final    Comment:        The GeneXpert MRSA Assay (FDA approved for NASAL specimens only), is one component of a comprehensive MRSA colonization surveillance program. It is not intended to diagnose MRSA infection nor to guide or monitor treatment for MRSA infections.     Radiology Reports Ct Head Wo Contrast  06/20/2015  CLINICAL DATA:  Altered mental status. EXAM: CT HEAD WITHOUT CONTRAST TECHNIQUE: Contiguous  axial images were obtained from the base of the skull through the vertex without intravenous contrast. COMPARISON:  01/31/2015. FINDINGS: Diffusely enlarged ventricles and subarachnoid spaces. Patchy white matter low density in both cerebral hemispheres. Stable old right thalamic lacunar infarct. No intracranial hemorrhage, mass lesion or CT  evidence of acute infarction. The anterior aspect of the left middle nasal turbinate is pneumatized. Otherwise, unremarkable bones and included paranasal sinuses. IMPRESSION: 1. No acute abnormality. 2. Stable atrophy and chronic small vessel white matter ischemic changes. 3. Stable old right thalamic lacunar infarct. Electronically Signed   By: Beckie Salts M.D.   On: 06/20/2015 10:05   Nm Gi Blood Loss  06/10/2015  CLINICAL DATA:  Lower gastrointestinal bleeding. EXAM: NUCLEAR MEDICINE GASTROINTESTINAL BLEEDING SCAN TECHNIQUE: Sequential abdominal images were obtained following intravenous administration of Tc-107m labeled red blood cells. RADIOPHARMACEUTICALS:  21.16 mCi Tc-68m in-vitro labeled red cells. COMPARISON:  None. FINDINGS: No abnormal accumulation is seen to suggest active gastrointestinal bleeding. IMPRESSION: No scintigraphic evidence of active gastrointestinal bleeding. Electronically Signed   By: Lupita Raider, M.D.   On: 06/10/2015 10:37   US Renal  06/20/2015  CLINICAL DATA:  Urinary tract infection without hematuria EXAM: RENAL / URINARY TRACT ULTRASOUND COMPLETE COMPARISON:  None. FINDINGS: Right Kidney: Length: 9.6 cm. Echogenicity within normal limits. No mass or hydronephrosis visualized. Left Kidney: Length: 9.6 cm. Echogenicity within normal limits. No mass or hydronephrosis visualized. Bladder: Decompressed by Foley catheter and therefore not evaluated IMPRESSION: Normal renal ultrasound Electronically Signed   By: Esperanza Heir M.D.   On: 06/20/2015 13:55   Dg Chest Portable 1 View  06/20/2015  CLINICAL DATA:  EMS states pt was diagnosed with a UTI approximately x1 week ago and was given abx, which has been completed. EMS reports pt has been agitated with staff more frequently and has requested to been seen by a medical professional. Staff at patient is a nursing facility reports an elevated temperature, not noted currently. Patient is currently disoriented. EXAM: PORTABLE  CHEST 1 VIEW COMPARISON:  06/14/2015 FINDINGS: Cardiac silhouette is normal in size and configuration. Aorta is mildly uncoiled. No mediastinal or hilar masses or evidence of adenopathy. Lungs are mildly hyperexpanded. Mild scarring and/ or subsegmental atelectasis at the lung bases is stable. Lungs are otherwise clear. Bony thorax is demineralized. There are advanced arthropathic changes of both shoulder joints, stable. IMPRESSION: No acute cardiopulmonary disease. Electronically Signed   By: Amie Portland M.D.   On: 06/20/2015 09:34   Dg Chest Port 1 View  06/14/2015  CLINICAL DATA:  Cough. EXAM: PORTABLE CHEST 1 VIEW COMPARISON:  June 03, 2012. FINDINGS: The heart size and mediastinal contours are within normal limits. No pneumothorax or pleural effusion is noted. Mild bibasilar subsegmental atelectasis is noted. Mild degenerative changes seen involving both glenohumeral joints. IMPRESSION: Mild bibasilar subsegmental atelectasis. Electronically Signed   By: Lupita Raider, M.D.   On: 06/14/2015 14:08   Dg Abd 2 Views  06/13/2015  CLINICAL DATA:  Subsequent encounter for several day history of rectal bleeding. EXAM: ABDOMEN - 2 VIEW COMPARISON:  None. FINDINGS: Upright film shows some probable atelectasis at the left lung base. No evidence for intraperitoneal free air. Supine film shows no gaseous bowel dilatation to suggest obstruction. Bones are diffusely demineralized. IMPRESSION: Negative. Electronically Signed   By: Kennith Center M.D.   On: 06/13/2015 08:03     CBC  Recent Labs Lab 06/20/15 0401 06/20/15 1510 06/21/15 0828 06/24/15 0352  WBC 9.2 10.0  8.6 9.7  HGB 11.2* 11.1* 11.3* 11.3*  HCT 33.5* 33.8* 33.0* 33.5*  PLT 321 336 330 371  MCV 90.2 90.5 89.9 89.5  MCH 30.2 29.8 30.9 30.1  MCHC 33.5 32.9 34.4 33.6  RDW 14.1 13.9 14.1 14.3  LYMPHSABS  --   --  1.4  --   MONOABS  --   --  0.6  --   EOSABS  --   --  0.2  --   BASOSABS  --   --  0.1  --     Chemistries   Recent  Labs Lab 06/20/15 0401 06/20/15 1510 06/21/15 0828 06/23/15 0426  NA 139  --  140  --   K 3.2*  --  3.6  --   CL 105  --  105  --   CO2 26  --  24  --   GLUCOSE 103*  --  88  --   BUN 22*  --  18  --   CREATININE 0.78 0.61 0.66 0.65  CALCIUM 9.3  --  9.2  --   AST 22  --   --   --   ALT 15  --   --   --   ALKPHOS 75  --   --   --   BILITOT 0.5  --   --   --    ------------------------------------------------------------------------------------------------------------------ estimated creatinine clearance is 35.8 mL/min (by C-G formula based on Cr of 0.65). ------------------------------------------------------------------------------------------------------------------ No results for input(s): HGBA1C in the last 72 hours. ------------------------------------------------------------------------------------------------------------------ No results for input(s): CHOL, HDL, LDLCALC, TRIG, CHOLHDL, LDLDIRECT in the last 72 hours. ------------------------------------------------------------------------------------------------------------------ No results for input(s): TSH, T4TOTAL, T3FREE, THYROIDAB in the last 72 hours.  Invalid input(s): FREET3 ------------------------------------------------------------------------------------------------------------------ No results for input(s): VITAMINB12, FOLATE, FERRITIN, TIBC, IRON, RETICCTPCT in the last 72 hours.  Coagulation profile No results for input(s): INR, PROTIME in the last 168 hours.  No results for input(s): DDIMER in the last 72 hours.  Cardiac Enzymes  Recent Labs Lab 06/20/15 1510 06/20/15 1904 06/21/15 0138  TROPONINI 0.21* 0.21* 0.22*   ------------------------------------------------------------------------------------------------------------------ Invalid input(s): POCBNP    Assessment & Plan   IMPRESSION AND PLAN: Patient is a 81 year old white female who was sent from skilled nursing facility for  agitation  1. Acute encephalopathy due to urinary tract infection,   mental status waxing and waning certainly could be related to her age I do not believe that this is due to UTI. UA shows no significant bacteria now. Urine cultures are not growing any further bacteria. 2. Recurrent urinary tract infection case discussed with Dr. Sampson GoonFitzgerald of infectious disease, patient was noted to have ESBL Escherichia coli as well as another organism Aerococcus.  I have discussed the case with Dr. Sampson GoonFitzgerald who recommends doing oral Macrobid. And repeating a urinalysis next week if she still has symptoms or has a very abnormal urinalysis he will treat her. 3. Hypertension: Continue amlodipine  4. Elevated troponin likely due to demand ischemia awake echocardiogram of the heart  5. CODE STATUS is DO NOT RESUSCITATE     Code Status Orders        Start     Ordered   06/20/15 1315  Do not attempt resuscitation (DNR)   Continuous    Question Answer Comment  In the event of cardiac or respiratory ARREST Do not call a "code blue"   In the event of cardiac or respiratory ARREST Do not perform Intubation, CPR, defibrillation or ACLS   In the event  of cardiac or respiratory ARREST Use medication by any route, position, wound care, and other measures to relive pain and suffering. May use oxygen, suction and manual treatment of airway obstruction as needed for comfort.      06/20/15 1314    Advance Directive Documentation        Most Recent Value   Type of Advance Directive  Healthcare Power of Attorney   Pre-existing out of facility DNR order (yellow form or pink MOST form)     "MOST" Form in Place?        Await mental status improvement prior to discharge.        DVT Prophylaxis  heparin  Lab Results  Component Value Date   PLT 371 06/24/2015     Time Spent in minutes  25 minutes   Auburn Bilberry M.D on 06/24/2015 at 11:50 AM  Between 7am to 6pm - Pager - [phone]  After 6pm go  to [email or web address] - password [email or web address] [facility]  [facility]   Office  [phone]

## 2015-06-24 NOTE — Progress Notes (Signed)
   SUBJECTIVE: pt resting comfortably   Filed Vitals:   06/23/15 0746 06/23/15 1156 06/23/15 2025 06/24/15 0533  BP: 129/62 120/52 120/61 124/49  Pulse: 81 70 81 82  Temp: 97.9 F (36.6 C) 97.7 F (36.5 C) 97.6 F (36.4 C) 97.9 F (36.6 C)  TempSrc: Oral Oral Oral Oral  Resp: 20 20 18 18   Height:      Weight:      SpO2: 93% 96% 93% 97%    Intake/Output Summary (Last 24 hours) at 06/24/15 0853 Last data filed at 06/24/15 0052  Gross per 24 hour  Intake      0 ml  Output    800 ml  Net   -800 ml    LABS: Basic Metabolic Panel:  Recent Labs  16/05/9609/27/16 0426  CREATININE 0.65   Liver Function Tests: No results for input(s): AST, ALT, ALKPHOS, BILITOT, PROT, ALBUMIN in the last 72 hours. No results for input(s): LIPASE, AMYLASE in the last 72 hours. CBC:  Recent Labs  06/24/15 0352  WBC 9.7  HGB 11.3*  HCT 33.5*  MCV 89.5  PLT 371   Cardiac Enzymes: No results for input(s): CKTOTAL, CKMB, CKMBINDEX, TROPONINI in the last 72 hours. BNP: Invalid input(s): POCBNP D-Dimer: No results for input(s): DDIMER in the last 72 hours. Hemoglobin A1C: No results for input(s): HGBA1C in the last 72 hours. Fasting Lipid Panel: No results for input(s): CHOL, HDL, LDLCALC, TRIG, CHOLHDL, LDLDIRECT in the last 72 hours. Thyroid Function Tests: No results for input(s): TSH, T4TOTAL, T3FREE, THYROIDAB in the last 72 hours.  Invalid input(s): FREET3 Anemia Panel: No results for input(s): VITAMINB12, FOLATE, FERRITIN, TIBC, IRON, RETICCTPCT in the last 72 hours.   PHYSICAL EXAM General: Well developed, well nourished, in no acute distress HEENT:  Normocephalic and atramatic Neck:  No JVD.  Lungs: Clear bilaterally to auscultation and percussion. Heart: HRRR . Normal S1 and S2 without gallops or murmurs.  Abdomen: Bowel sounds are positive, abdomen soft and non-tender  Msk:  Back normal, normal gait. Normal strength and tone for age. Extremities: No clubbing, cyanosis or  edema.     TELEMETRY: Reviewed telemetry pt in [name]  ASSESSMENT AND PLAN:   1. Mildly elevated troponin 2/2 demand ischemia. No CP or EKG changes. Echo shows normal LVEF and WM   Pt given f/u in office 11/8 at 2pm.   Patient and plan discussed with supervising provider, Dr. Adrian BlackwaterShaukat Khan, who agrees with above findings.   Alinda SierrasEileen A. Margarito CourserRomano, PA-C [facility]  06/24/2015 8:53 AM

## 2015-06-24 NOTE — Progress Notes (Signed)
ANTIBIOTIC CONSULT NOTE - FOLLOW UP  Pharmacy Consult for vancomycin Indication: UTI  Allergies  Allergen Reactions  . Celebrex [Celecoxib]     Patient Measurements: Height:  (162.6 cm) Weight: 144 lb 14.4 oz (65.726 kg) IBW/kg (Calculated) : 54.7  Vital Signs: Temp: 97.1 F (36.2 C) (10/28 1211) Temp Source: Axillary (10/28 1211) BP: 117/38 mmHg (10/28 1211) Pulse Rate: 56 (10/28 1211) Intake/Output from previous day: 10/27 0701 - 10/28 0700 In: -  Out: 800 [Urine:800] Intake/Output from this shift: Total I/O In: -  Out: 450 [Urine:450]  Labs:  Recent Labs  06/23/15 0426 06/24/15 0352  WBC  --  9.7  HGB  --  11.3*  PLT  --  371  CREATININE 0.65  --    Estimated Creatinine Clearance: 35.8 mL/min (by C-G formula based on Cr of 0.65).  Recent Labs  06/24/15 1740  VANCOTROUGH 13     Microbiology: Recent Results (from the past 720 hour(s))  Urine culture     Status: None   Collection Time: 06/13/15  2:08 PM  Result Value Ref Range Status   Specimen Description URINE, CLEAN CATCH  Final   Special Requests NONE  Final   Culture   Final    >=100,000 COLONIES/mL ESCHERICHIA COLI Results Called to: ANITA HARRIS AT 1049 ON 06/16/15 CTJ ESBL-EXTENDED SPECTRUM BETA LACTAMASE-THE ORGANISM IS RESISTANT TO PENICILLINS, CEPHALOSPORINS AND AZTREONAM ACCORDING TO CLSI M100-S15 VOL.25 N01 JAN 2005. >=100,000 COLONIES/mL AEROCOCCUS URINAE    Report Status 06/18/2015 FINAL  Final   Organism ID, Bacteria ESCHERICHIA COLI  Final      Susceptibility   Escherichia coli - MIC*    AMPICILLIN >=32 RESISTANT Resistant     CEFAZOLIN >=64 RESISTANT Resistant     CEFTRIAXONE >=64 RESISTANT Resistant     CIPROFLOXACIN >=4 RESISTANT Resistant     GENTAMICIN <=1 SENSITIVE Sensitive     IMIPENEM <=0.25 SENSITIVE Sensitive     NITROFURANTOIN <=16 SENSITIVE Sensitive     TRIMETH/SULFA >=320 RESISTANT Resistant     Extended ESBL POSITIVE Resistant     PIP/TAZO Value in next  row Sensitive      SENSITIVE<=4    ERTAPENEM Value in next row Sensitive      SENSITIVE<=0.5    LEVOFLOXACIN Value in next row Resistant      RESISTANT>=8    * >=100,000 COLONIES/mL ESCHERICHIA COLI  Culture, blood (routine x 2)     Status: None   Collection Time: 06/14/15  2:27 PM  Result Value Ref Range Status   Specimen Description BLOOD RIGHT ASSIST CONTROL  Final   Special Requests   Final    BOTTLES DRAWN AEROBIC AND ANAEROBIC 1CC AEROBIC,1CC ANAEROBIC   Culture NO GROWTH 5 DAYS  Final   Report Status 06/19/2015 FINAL  Final  Urine culture     Status: None (Preliminary result)   Collection Time: 06/20/15  4:25 AM  Result Value Ref Range Status   Specimen Description URINE, CATHETERIZED  Final   Special Requests vanco ertapenem  Final   Culture   Final    100 COLONIES/mL GRAM NEGATIVE RODS IDENTIFICATION AND SUSCEPTIBILITIES TO FOLLOW 80,000 COLONIES/ml BUDDING YEAST SEEN  ID TO FOLLOW ONCE MORE GROWTH    Report Status PENDING  Incomplete  MRSA PCR Screening     Status: None   Collection Time: 06/21/15 11:05 AM  Result Value Ref Range Status   MRSA by PCR NEGATIVE NEGATIVE Final    Comment:  The GeneXpert MRSA Assay (FDA approved for NASAL specimens only), is one component of a comprehensive MRSA colonization surveillance program. It is not intended to diagnose MRSA infection nor to guide or monitor treatment for MRSA infections.     Anti-infectives    Start     Dose/Rate Route Frequency Ordered Stop   06/21/15 1800  ertapenem (INVANZ) 1 g in sodium chloride 0.9 % 50 mL IVPB     1 g 100 mL/hr over 30 Minutes Intravenous Every 24 hours 06/21/15 0935     06/21/15 1800  vancomycin (VANCOCIN) IVPB 750 mg/150 ml premix     750 mg 150 mL/hr over 60 Minutes Intravenous Every 24 hours 06/21/15 0935     06/21/15 1000  vancomycin (VANCOCIN) IVPB 750 mg/150 ml premix     750 mg 150 mL/hr over 60 Minutes Intravenous  Once 06/21/15 0935 06/21/15 1200   06/20/15  1600  ertapenem (INVANZ) 1 g in sodium chloride 0.9 % 50 mL IVPB  Status:  Discontinued     1 g 100 mL/hr over 30 Minutes Intravenous Every 24 hours 06/20/15 1314 06/21/15 0935      Assessment: Pharmacy is dosing vancomycin in this 81 year old female admitted from nursing home with a potentially resistant UTI. Patient was on ertapenem PTA for ESBL-positive UTI. ID is following. Currently on day #4 vancomycin and day #8 ertapenem. Cultures have not yet finalized.   Goal of Therapy:  Vancomycin trough level 10-15 mcg/ml  Plan:  Measure antibiotic drug levels at steady state Follow up culture results  Vancomycin trough drawn appropriately today was therapeutic at 13 mcg/mL and should represent steady state. Will continue with current regimen of vancomycin 750 mg IV q24 hours.  Pharmacy will continue to monitor renal function and  Vancomycin levels and adjust as needed.   Jodelle RedMary M Obinna Ehresman 06/24/2015,6:23 PM

## 2015-06-25 LAB — BASIC METABOLIC PANEL
ANION GAP: 11 (ref 5–15)
BUN: 23 mg/dL — AB (ref 6–20)
CHLORIDE: 105 mmol/L (ref 101–111)
CO2: 24 mmol/L (ref 22–32)
Calcium: 9.4 mg/dL (ref 8.9–10.3)
Creatinine, Ser: 0.93 mg/dL (ref 0.44–1.00)
GFR calc non Af Amer: 49 mL/min — ABNORMAL LOW (ref >60)
GFR, EST AFRICAN AMERICAN: 57 mL/min — AB (ref >60)
Glucose, Bld: 81 mg/dL (ref 65–99)
POTASSIUM: 4 mmol/L (ref 3.5–5.1)
Sodium: 140 mmol/L (ref 135–145)

## 2015-06-25 NOTE — Progress Notes (Signed)
[facility] [place] at [facility]                                                                                                                                                                                            Patient Demographics   [name], is a 81 y.o. female, DOB - [birth year], MVH:[id number]  Admit date - 06/20/2015   Admitting Physician Auburn Bilberry, MD  Outpatient Primary MD for the patient is Lauro Regulus., MD   LOS - 5  Subjective: Confused  Review of Systems:   Confused  Vitals:   Filed Vitals:   06/24/15 1021 06/24/15 1211 06/24/15 2047 06/25/15 0554  BP: 120/63 117/38 119/47 117/48  Pulse: 66 56 67 74  Temp: 97.3 F (36.3 C) 97.1 F (36.2 C) 97.8 F (36.6 C) 98.5 F (36.9 C)  TempSrc: Oral Axillary    Resp: 17 16 14 16   Height:      Weight:      SpO2: 97% 95% 94% 94%    Wt Readings from Last 3 Encounters:  06/20/15 65.726 kg (144 lb 14.4 oz)  06/09/15 69.718 kg (153 lb 11.2 oz)  01/31/15 74.844 kg (165 lb)     Intake/Output Summary (Last 24 hours) at 06/25/15 1014 Last data filed at 06/25/15 0800  Gross per 24 hour  Intake    200 ml  Output    600 ml  Net   -400 ml    Physical Exam:   GENERAL: Confused and agitated  HEAD, EYES, EARS, NOSE AND THROAT: Atraumatic, normocephalic. Extraocular muscles are intact. Pupils equal and reactive to light. Sclerae anicteric. No conjunctival injection. No oro-pharyngeal erythema.  NECK: Supple. There is no jugular venous distention. No bruits, no lymphadenopathy, no thyromegaly.  HEART: Regular rate and rhythm,. No murmurs, no rubs, no clicks.  LUNGS: Clear to auscultation bilaterally. No rales or rhonchi. No wheezes.  ABDOMEN: Soft, flat, nontender, nondistended. Has good bowel sounds. No hepatosplenomegaly appreciated.  EXTREMITIES: No evidence of any cyanosis, clubbing, or peripheral edema.  +2 pedal and radial pulses bilaterally.  NEUROLOGIC: The patient is  confused, awake, and no focal motor or sensory deficits appreciated bilaterally.  SKIN: Moist and warm with no rashes appreciated.  Psych: Confused LN: No inguinal LN enlargement    Antibiotics   Anti-infectives    Start     Dose/Rate Route Frequency Ordered Stop   06/21/15 1800  ertapenem (INVANZ) 1 g in sodium chloride 0.9 % 50 mL IVPB     1 g 100 mL/hr over 30 Minutes Intravenous Every 24 hours 06/21/15 0935     06/21/15  1800  vancomycin (VANCOCIN) IVPB 750 mg/150 ml premix     750 mg 150 mL/hr over 60 Minutes Intravenous Every 24 hours 06/21/15 0935     06/21/15 1000  vancomycin (VANCOCIN) IVPB 750 mg/150 ml premix     750 mg 150 mL/hr over 60 Minutes Intravenous  Once 06/21/15 0935 06/21/15 1200   06/20/15 1600  ertapenem (INVANZ) 1 g in sodium chloride 0.9 % 50 mL IVPB  Status:  Discontinued     1 g 100 mL/hr over 30 Minutes Intravenous Every 24 hours 06/20/15 1314 06/21/15 0935      Medications   Scheduled Meds: . amLODipine  5 mg Oral Daily  . aspirin  81 mg Oral Daily  . chlorthalidone  25 mg Oral Daily  . cholecalciferol  2,000 Units Oral Daily  . ertapenem (INVANZ) IV  1 g Intravenous Q24H  . heparin  5,000 Units Subcutaneous 3 times per day  . hydrocortisone   Rectal TID  . latanoprost  1 drop Both Eyes QHS  . lidocaine  1 patch Transdermal Q24H  . multivitamin with minerals  1 tablet Oral Daily  . naproxen  250 mg Oral TID WC  . oxybutynin  10 mg Oral Daily  . pantoprazole  40 mg Oral Daily  . polyethylene glycol  17 g Oral Daily  . QUEtiapine  25 mg Oral QHS  . senna-docusate  1 tablet Oral BID  . sodium chloride  3 mL Intravenous Q12H  . sodium chloride  3 mL Intravenous Q12H  . trandolapril  1 mg Oral Daily  . triamcinolone cream  1 application Topical BID  . vancomycin  750 mg Intravenous Q24H   Continuous Infusions:  PRN Meds:.sodium chloride, acetaminophen **OR** acetaminophen, benzonatate, haloperidol lactate, HYDROcodone-homatropine, ondansetron  **OR** ondansetron (ZOFRAN) IV, polyvinyl alcohol, pramoxine-mineral oil-zinc, sodium chloride   Data Review:   Micro Results Recent Results (from the past 240 hour(s))  Urine culture     Status: None (Preliminary result)   Collection Time: 06/20/15  4:25 AM  Result Value Ref Range Status   Specimen Description URINE, CATHETERIZED  Final   Special Requests vanco ertapenem  Final   Culture   Final    100 COLONIES/mL GRAM NEGATIVE RODS IDENTIFICATION AND SUSCEPTIBILITIES TO FOLLOW 80,000 COLONIES/ml BUDDING YEAST SEEN  ID TO FOLLOW ONCE MORE GROWTH    Report Status PENDING  Incomplete  MRSA PCR Screening     Status: None   Collection Time: 06/21/15 11:05 AM  Result Value Ref Range Status   MRSA by PCR NEGATIVE NEGATIVE Final    Comment:        The GeneXpert MRSA Assay (FDA approved for NASAL specimens only), is one component of a comprehensive MRSA colonization surveillance program. It is not intended to diagnose MRSA infection nor to guide or monitor treatment for MRSA infections.     Radiology Reports Ct Head Wo Contrast  06/20/2015  CLINICAL DATA:  Altered mental status. EXAM: CT HEAD WITHOUT CONTRAST TECHNIQUE: Contiguous axial images were obtained from the base of the skull through the vertex without intravenous contrast. COMPARISON:  01/31/2015. FINDINGS: Diffusely enlarged ventricles and subarachnoid spaces. Patchy white matter low density in both cerebral hemispheres. Stable old right thalamic lacunar infarct. No intracranial hemorrhage, mass lesion or CT evidence of acute infarction. The anterior aspect of the left middle nasal turbinate is pneumatized. Otherwise, unremarkable bones and included paranasal sinuses. IMPRESSION: 1. No acute abnormality. 2. Stable atrophy and chronic small vessel white matter ischemic changes.  3. Stable old right thalamic lacunar infarct. Electronically Signed   By: Beckie Salts M.D.   On: 06/20/2015 10:05   Nm Gi Blood Loss  06/10/2015   CLINICAL DATA:  Lower gastrointestinal bleeding. EXAM: NUCLEAR MEDICINE GASTROINTESTINAL BLEEDING SCAN TECHNIQUE: Sequential abdominal images were obtained following intravenous administration of Tc-65m labeled red blood cells. RADIOPHARMACEUTICALS:  21.16 mCi Tc-59m in-vitro labeled red cells. COMPARISON:  None. FINDINGS: No abnormal accumulation is seen to suggest active gastrointestinal bleeding. IMPRESSION: No scintigraphic evidence of active gastrointestinal bleeding. Electronically Signed   By: Lupita Raider, M.D.   On: 06/10/2015 10:37   US Renal  06/20/2015  CLINICAL DATA:  Urinary tract infection without hematuria EXAM: RENAL / URINARY TRACT ULTRASOUND COMPLETE COMPARISON:  None. FINDINGS: Right Kidney: Length: 9.6 cm. Echogenicity within normal limits. No mass or hydronephrosis visualized. Left Kidney: Length: 9.6 cm. Echogenicity within normal limits. No mass or hydronephrosis visualized. Bladder: Decompressed by Foley catheter and therefore not evaluated IMPRESSION: Normal renal ultrasound Electronically Signed   By: Esperanza Heir M.D.   On: 06/20/2015 13:55   Dg Chest Portable 1 View  06/20/2015  CLINICAL DATA:  EMS states pt was diagnosed with a UTI approximately x1 week ago and was given abx, which has been completed. EMS reports pt has been agitated with staff more frequently and has requested to been seen by a medical professional. Staff at patient is a nursing facility reports an elevated temperature, not noted currently. Patient is currently disoriented. EXAM: PORTABLE CHEST 1 VIEW COMPARISON:  06/14/2015 FINDINGS: Cardiac silhouette is normal in size and configuration. Aorta is mildly uncoiled. No mediastinal or hilar masses or evidence of adenopathy. Lungs are mildly hyperexpanded. Mild scarring and/ or subsegmental atelectasis at the lung bases is stable. Lungs are otherwise clear. Bony thorax is demineralized. There are advanced arthropathic changes of both shoulder joints, stable.  IMPRESSION: No acute cardiopulmonary disease. Electronically Signed   By: Amie Portland M.D.   On: 06/20/2015 09:34   Dg Chest Port 1 View  06/14/2015  CLINICAL DATA:  Cough. EXAM: PORTABLE CHEST 1 VIEW COMPARISON:  June 03, 2012. FINDINGS: The heart size and mediastinal contours are within normal limits. No pneumothorax or pleural effusion is noted. Mild bibasilar subsegmental atelectasis is noted. Mild degenerative changes seen involving both glenohumeral joints. IMPRESSION: Mild bibasilar subsegmental atelectasis. Electronically Signed   By: Lupita Raider, M.D.   On: 06/14/2015 14:08   Dg Abd 2 Views  06/13/2015  CLINICAL DATA:  Subsequent encounter for several day history of rectal bleeding. EXAM: ABDOMEN - 2 VIEW COMPARISON:  None. FINDINGS: Upright film shows some probable atelectasis at the left lung base. No evidence for intraperitoneal free air. Supine film shows no gaseous bowel dilatation to suggest obstruction. Bones are diffusely demineralized. IMPRESSION: Negative. Electronically Signed   By: Kennith Center M.D.   On: 06/13/2015 08:03     CBC  Recent Labs Lab 06/20/15 0401 06/20/15 1510 06/21/15 0828 06/24/15 0352  WBC 9.2 10.0 8.6 9.7  HGB 11.2* 11.1* 11.3* 11.3*  HCT 33.5* 33.8* 33.0* 33.5*  PLT 321 336 330 371  MCV 90.2 90.5 89.9 89.5  MCH 30.2 29.8 30.9 30.1  MCHC 33.5 32.9 34.4 33.6  RDW 14.1 13.9 14.1 14.3  LYMPHSABS  --   --  1.4  --   MONOABS  --   --  0.6  --   EOSABS  --   --  0.2  --   BASOSABS  --   --  0.1  --     Chemistries   Recent Labs Lab 06/20/15 0401 06/20/15 1510 06/21/15 0828 06/23/15 0426 06/25/15 0713  NA 139  --  140  --  140  K 3.2*  --  3.6  --  4.0  CL 105  --  105  --  105  CO2 26  --  24  --  24  GLUCOSE 103*  --  88  --  81  BUN 22*  --  18  --  23*  CREATININE 0.78 0.61 0.66 0.65 0.93  CALCIUM 9.3  --  9.2  --  9.4  AST 22  --   --   --   --   ALT 15  --   --   --   --   ALKPHOS 75  --   --   --   --   BILITOT 0.5   --   --   --   --    ------------------------------------------------------------------------------------------------------------------ estimated creatinine clearance is 30.8 mL/min (by C-G formula based on Cr of 0.93). ------------------------------------------------------------------------------------------------------------------ No results for input(s): HGBA1C in the last 72 hours. ------------------------------------------------------------------------------------------------------------------ No results for input(s): CHOL, HDL, LDLCALC, TRIG, CHOLHDL, LDLDIRECT in the last 72 hours. ------------------------------------------------------------------------------------------------------------------ No results for input(s): TSH, T4TOTAL, T3FREE, THYROIDAB in the last 72 hours.  Invalid input(s): FREET3 ------------------------------------------------------------------------------------------------------------------ No results for input(s): VITAMINB12, FOLATE, FERRITIN, TIBC, IRON, RETICCTPCT in the last 72 hours.  Coagulation profile No results for input(s): INR, PROTIME in the last 168 hours.  No results for input(s): DDIMER in the last 72 hours.  Cardiac Enzymes  Recent Labs Lab 06/20/15 1510 06/20/15 1904 06/21/15 0138  TROPONINI 0.21* 0.21* 0.22*   ------------------------------------------------------------------------------------------------------------------ Invalid input(s): POCBNP    Assessment & Plan   IMPRESSION AND PLAN: Patient is a 81 year old white female who was sent from skilled nursing facility for agitation  1. Acute encephalopathy due to urinary tract infection,  Likely having underlying dementia which is worsening at this time.   2. Recurrent urinary tract infection case discussed with Dr. Sampson GoonFitzgerald of infectious disease, patient was noted to have ESBL Escherichia coli as well as another organism Aerococcus. Await urine culture to decide on the final  antibiotics.  3. Hypertension: Continue amlodipine  4. Elevated troponin likely due to demand ischemia , echo shows EF 60-65%. 5. CODE STATUS is DO NOT RESUSCITATE     Code Status Orders        Start     Ordered   06/20/15 1315  Do not attempt resuscitation (DNR)   Continuous    Question Answer Comment  In the event of cardiac or respiratory ARREST Do not call a "code blue"   In the event of cardiac or respiratory ARREST Do not perform Intubation, CPR, defibrillation or ACLS   In the event of cardiac or respiratory ARREST Use medication by any route, position, wound care, and other measures to relive pain and suffering. May use oxygen, suction and manual treatment of airway obstruction as needed for comfort.      06/20/15 1314    Advance Directive Documentation        Most Recent Value   Type of Advance Directive  Healthcare Power of Attorney   Pre-existing out of facility DNR order (yellow form or pink MOST form)     "MOST" Form in Place?                DVT Prophylaxis  heparin  Lab Results  Component Value Date  PLT 371 06/24/2015     Time Spent in minutes  25 minutes   Lillyan Hitson M.D on 06/25/2015 at 10:14 AM  Between 7am to 6pm - Pager - [phone]  After 6pm go to [email or web address] - password [email or web address] [facility]  [facility]   Office  [phone]

## 2015-06-25 NOTE — Progress Notes (Signed)
150 cc output obtained from the Foley cath the whole shift. DR. Anne HahnWillis notified with a new order for BMP to be done. Patient is alert, no acute distress noted. Resting quietly with her eyes closed at this time, respirations even and unlabored.  Will continue to monitor.

## 2015-06-26 MED ORDER — SODIUM CHLORIDE 0.9 % IV SOLN
INTRAVENOUS | Status: AC
  Administered 2015-06-26: 10:00:00 via INTRAVENOUS

## 2015-06-26 NOTE — Progress Notes (Signed)
[facility] [place] at [facility]                                                                                                                                                                                            Patient Demographics   [name], is a 81 y.o. female, DOB - May 31, 1913, ZOX:[id number]  Admit date - 06/20/2015   Admitting Physician Auburn Bilberry, MD  Outpatient Primary MD for the patient is Lauro Regulus., MD   LOS - 6  Subjective: Thank you confused, patient thinks she is at home. Review of Systems:   Confused  Vitals:   Filed Vitals:   06/25/15 0554 06/25/15 1205 06/25/15 2032 06/26/15 0446  BP: 117/48 126/53 117/51 149/62  Pulse: 74 69 76 85  Temp: 98.5 F (36.9 C) 98 F (36.7 C) 97.4 F (36.3 C) 97.9 F (36.6 C)  TempSrc:   Oral Oral  Resp: 16 19 20 18   Height:      Weight:      SpO2: 94% 97% 94% 94%    Wt Readings from Last 3 Encounters:  06/20/15 65.726 kg (144 lb 14.4 oz)  06/09/15 69.718 kg (153 lb 11.2 oz)  01/31/15 74.844 kg (165 lb)     Intake/Output Summary (Last 24 hours) at 06/26/15 0734 Last data filed at 06/26/15 0448  Gross per 24 hour  Intake      0 ml  Output    350 ml  Net   -350 ml    Physical Exam:   GENERAL: Confused and agitated  HEAD, EYES, EARS, NOSE AND THROAT: Atraumatic, normocephalic. Extraocular muscles are intact. Pupils equal and reactive to light. Sclerae anicteric. No conjunctival injection. No oro-pharyngeal erythema.  NECK: Supple. There is no jugular venous distention. No bruits, no lymphadenopathy, no thyromegaly.  HEART: Regular rate and rhythm,. No murmurs, no rubs, no clicks.  LUNGS: Clear to auscultation bilaterally. No rales or rhonchi. No wheezes.  ABDOMEN: Soft, flat, nontender, nondistended. Has good bowel sounds. No hepatosplenomegaly appreciated.  EXTREMITIES: No evidence of any cyanosis, clubbing, or peripheral edema.  +2 pedal and radial pulses  bilaterally.  NEUROLOGIC: The patient is confused, awake, and no focal motor or sensory deficits appreciated bilaterally.  SKIN: Moist and warm with no rashes appreciated.  Psych: Confused LN: No inguinal LN enlargement    Antibiotics   Anti-infectives    Start     Dose/Rate Route Frequency Ordered Stop   06/21/15 1800  ertapenem (INVANZ) 1 g in sodium chloride 0.9 % 50 mL IVPB     1 g 100 mL/hr over 30 Minutes Intravenous Every  24 hours 06/21/15 0935     06/21/15 1800  vancomycin (VANCOCIN) IVPB 750 mg/150 ml premix     750 mg 150 mL/hr over 60 Minutes Intravenous Every 24 hours 06/21/15 0935     06/21/15 1000  vancomycin (VANCOCIN) IVPB 750 mg/150 ml premix     750 mg 150 mL/hr over 60 Minutes Intravenous  Once 06/21/15 0935 06/21/15 1200   06/20/15 1600  ertapenem (INVANZ) 1 g in sodium chloride 0.9 % 50 mL IVPB  Status:  Discontinued     1 g 100 mL/hr over 30 Minutes Intravenous Every 24 hours 06/20/15 1314 06/21/15 0935      Medications   Scheduled Meds: . amLODipine  5 mg Oral Daily  . aspirin  81 mg Oral Daily  . chlorthalidone  25 mg Oral Daily  . cholecalciferol  2,000 Units Oral Daily  . ertapenem (INVANZ) IV  1 g Intravenous Q24H  . heparin  5,000 Units Subcutaneous 3 times per day  . hydrocortisone   Rectal TID  . latanoprost  1 drop Both Eyes QHS  . lidocaine  1 patch Transdermal Q24H  . multivitamin with minerals  1 tablet Oral Daily  . naproxen  250 mg Oral TID WC  . oxybutynin  10 mg Oral Daily  . pantoprazole  40 mg Oral Daily  . polyethylene glycol  17 g Oral Daily  . QUEtiapine  25 mg Oral QHS  . senna-docusate  1 tablet Oral BID  . sodium chloride  3 mL Intravenous Q12H  . sodium chloride  3 mL Intravenous Q12H  . trandolapril  1 mg Oral Daily  . triamcinolone cream  1 application Topical BID  . vancomycin  750 mg Intravenous Q24H   Continuous Infusions:  PRN Meds:.sodium chloride, acetaminophen **OR** acetaminophen, benzonatate, haloperidol  lactate, HYDROcodone-homatropine, ondansetron **OR** ondansetron (ZOFRAN) IV, polyvinyl alcohol, pramoxine-mineral oil-zinc, sodium chloride   Data Review:   Micro Results Recent Results (from the past 240 hour(s))  Urine culture     Status: None (Preliminary result)   Collection Time: 06/20/15  4:25 AM  Result Value Ref Range Status   Specimen Description URINE, CATHETERIZED  Final   Special Requests vanco ertapenem  Final   Culture   Final    100 COLONIES/mL GRAM NEGATIVE RODS IDENTIFICATION AND SUSCEPTIBILITIES TO FOLLOW 80,000 COLONIES/ml BUDDING YEAST SEEN  ID TO FOLLOW ONCE MORE GROWTH    Report Status PENDING  Incomplete  MRSA PCR Screening     Status: None   Collection Time: 06/21/15 11:05 AM  Result Value Ref Range Status   MRSA by PCR NEGATIVE NEGATIVE Final    Comment:        The GeneXpert MRSA Assay (FDA approved for NASAL specimens only), is one component of a comprehensive MRSA colonization surveillance program. It is not intended to diagnose MRSA infection nor to guide or monitor treatment for MRSA infections.     Radiology Reports Ct Head Wo Contrast  06/20/2015  CLINICAL DATA:  Altered mental status. EXAM: CT HEAD WITHOUT CONTRAST TECHNIQUE: Contiguous axial images were obtained from the base of the skull through the vertex without intravenous contrast. COMPARISON:  01/31/2015. FINDINGS: Diffusely enlarged ventricles and subarachnoid spaces. Patchy white matter low density in both cerebral hemispheres. Stable old right thalamic lacunar infarct. No intracranial hemorrhage, mass lesion or CT evidence of acute infarction. The anterior aspect of the left middle nasal turbinate is pneumatized. Otherwise, unremarkable bones and included paranasal sinuses. IMPRESSION: 1. No acute abnormality. 2. Stable  atrophy and chronic small vessel white matter ischemic changes. 3. Stable old right thalamic lacunar infarct. Electronically Signed   By: Beckie SaltsSteven  Reid M.D.   On:  06/20/2015 10:05   Nm Gi Blood Loss  06/10/2015  CLINICAL DATA:  Lower gastrointestinal bleeding. EXAM: NUCLEAR MEDICINE GASTROINTESTINAL BLEEDING SCAN TECHNIQUE: Sequential abdominal images were obtained following intravenous administration of Tc-6838m labeled red blood cells. RADIOPHARMACEUTICALS:  21.16 mCi Tc-4038m in-vitro labeled red cells. COMPARISON:  None. FINDINGS: No abnormal accumulation is seen to suggest active gastrointestinal bleeding. IMPRESSION: No scintigraphic evidence of active gastrointestinal bleeding. Electronically Signed   By: Lupita RaiderJames  Green Jr, M.D.   On: 06/10/2015 10:37   [place] Renal  06/20/2015  CLINICAL DATA:  Urinary tract infection without hematuria EXAM: RENAL / URINARY TRACT ULTRASOUND COMPLETE COMPARISON:  None. FINDINGS: Right Kidney: Length: 9.6 cm. Echogenicity within normal limits. No mass or hydronephrosis visualized. Left Kidney: Length: 9.6 cm. Echogenicity within normal limits. No mass or hydronephrosis visualized. Bladder: Decompressed by Foley catheter and therefore not evaluated IMPRESSION: Normal renal ultrasound Electronically Signed   By: Esperanza Heiraymond  Rubner M.D.   On: 06/20/2015 13:55   Dg Chest Portable 1 View  06/20/2015  CLINICAL DATA:  EMS states pt was diagnosed with a UTI approximately x1 week ago and was given abx, which has been completed. EMS reports pt has been agitated with staff more frequently and has requested to been seen by a medical professional. Staff at patient is a nursing facility reports an elevated temperature, not noted currently. Patient is currently disoriented. EXAM: PORTABLE CHEST 1 VIEW COMPARISON:  06/14/2015 FINDINGS: Cardiac silhouette is normal in size and configuration. Aorta is mildly uncoiled. No mediastinal or hilar masses or evidence of adenopathy. Lungs are mildly hyperexpanded. Mild scarring and/ or subsegmental atelectasis at the lung bases is stable. Lungs are otherwise clear. Bony thorax is demineralized. There are advanced  arthropathic changes of both shoulder joints, stable. IMPRESSION: No acute cardiopulmonary disease. Electronically Signed   By: Amie Portlandavid  Ormond M.D.   On: 06/20/2015 09:34   Dg Chest Port 1 View  06/14/2015  CLINICAL DATA:  Cough. EXAM: PORTABLE CHEST 1 VIEW COMPARISON:  June 03, 2012. FINDINGS: The heart size and mediastinal contours are within normal limits. No pneumothorax or pleural effusion is noted. Mild bibasilar subsegmental atelectasis is noted. Mild degenerative changes seen involving both glenohumeral joints. IMPRESSION: Mild bibasilar subsegmental atelectasis. Electronically Signed   By: Lupita RaiderJames  Green Jr, M.D.   On: 06/14/2015 14:08   Dg Abd 2 Views  06/13/2015  CLINICAL DATA:  Subsequent encounter for several day history of rectal bleeding. EXAM: ABDOMEN - 2 VIEW COMPARISON:  None. FINDINGS: Upright film shows some probable atelectasis at the left lung base. No evidence for intraperitoneal free air. Supine film shows no gaseous bowel dilatation to suggest obstruction. Bones are diffusely demineralized. IMPRESSION: Negative. Electronically Signed   By: Kennith CenterEric  Mansell M.D.   On: 06/13/2015 08:03     CBC  Recent Labs Lab 06/20/15 0401 06/20/15 1510 06/21/15 0828 06/24/15 0352  WBC 9.2 10.0 8.6 9.7  HGB 11.2* 11.1* 11.3* 11.3*  HCT 33.5* 33.8* 33.0* 33.5*  PLT 321 336 330 371  MCV 90.2 90.5 89.9 89.5  MCH 30.2 29.8 30.9 30.1  MCHC 33.5 32.9 34.4 33.6  RDW 14.1 13.9 14.1 14.3  LYMPHSABS  --   --  1.4  --   MONOABS  --   --  0.6  --   EOSABS  --   --  0.2  --  BASOSABS  --   --  0.1  --     Chemistries   Recent Labs Lab 06/20/15 0401 06/20/15 1510 06/21/15 0828 06/23/15 0426 06/25/15 0713  NA 139  --  140  --  140  K 3.2*  --  3.6  --  4.0  CL 105  --  105  --  105  CO2 26  --  24  --  24  GLUCOSE 103*  --  88  --  81  BUN 22*  --  18  --  23*  CREATININE 0.78 0.61 0.66 0.65 0.93  CALCIUM 9.3  --  9.2  --  9.4  AST 22  --   --   --   --   ALT 15  --   --   --    --   ALKPHOS 75  --   --   --   --   BILITOT 0.5  --   --   --   --    ------------------------------------------------------------------------------------------------------------------ estimated creatinine clearance is 30.8 mL/min (by C-G formula based on Cr of 0.93). ------------------------------------------------------------------------------------------------------------------ No results for input(s): HGBA1C in the last 72 hours. ------------------------------------------------------------------------------------------------------------------ No results for input(s): CHOL, HDL, LDLCALC, TRIG, CHOLHDL, LDLDIRECT in the last 72 hours. ------------------------------------------------------------------------------------------------------------------ No results for input(s): TSH, T4TOTAL, T3FREE, THYROIDAB in the last 72 hours.  Invalid input(s): FREET3 ------------------------------------------------------------------------------------------------------------------ No results for input(s): VITAMINB12, FOLATE, FERRITIN, TIBC, IRON, RETICCTPCT in the last 72 hours.  Coagulation profile No results for input(s): INR, PROTIME in the last 168 hours.  No results for input(s): DDIMER in the last 72 hours.  Cardiac Enzymes  Recent Labs Lab 06/20/15 1510 06/20/15 1904 06/21/15 0138  TROPONINI 0.21* 0.21* 0.22*   ------------------------------------------------------------------------------------------------------------------ Invalid input(s): POCBNP    Assessment & Plan   IMPRESSION AND PLAN: Patient is a 81 year old white female who was sent from skilled nursing facility for agitation  1. Acute encephalopathy due to urinary tract infection,  Likely having underlying dementia which is worsening at this time.   2. Recurrent urinary tract infection case discussed with Dr. Sampson Goon of infectious disease, patient was noted to have ESBL Escherichia coli as well as another organism  Aerococcus. Urine cultures are still pending. Continue vancomycin, Invanz.  3. Hypertension: Continue amlodipine  4. Elevated troponin likely due to demand ischemia , echo shows EF 60-65%. 5. CODE STATUS is DO NOT RESUSCITATE     Code Status Orders        Start     Ordered   06/20/15 1315  Do not attempt resuscitation (DNR)   Continuous    Question Answer Comment  In the event of cardiac or respiratory ARREST Do not call a "code blue"   In the event of cardiac or respiratory ARREST Do not perform Intubation, CPR, defibrillation or ACLS   In the event of cardiac or respiratory ARREST Use medication by any route, position, wound care, and other measures to relive pain and suffering. May use oxygen, suction and manual treatment of airway obstruction as needed for comfort.      06/20/15 1314    Advance Directive Documentation        Most Recent Value   Type of Advance Directive  Healthcare Power of Attorney   Pre-existing out of facility DNR order (yellow form or pink MOST form)     "MOST" Form in Place?                DVT Prophylaxis  heparin  Lab Results  Component Value Date   PLT 371 06/24/2015     Time Spent in minutes  25 minutes   Lasaundra Riche M.D on 06/26/2015 at 7:34 AM  Between 7am to 6pm - Pager - [phone]  After 6pm go to [email or web address] - password [email or web address] [facility]  [facility]   Office  [phone]

## 2015-06-26 NOTE — Progress Notes (Signed)
Patient very confused but pleasant. Bedbound, total care. No complaints. Foley in place. On IV antibiotics. Rested well during the night.

## 2015-06-26 NOTE — Progress Notes (Signed)
SUBJECTIVE: Patient is eating breakfast and denies any chest pain or shortness of breath but has some sore throat and cough without any productive cough   Filed Vitals:   06/25/15 0554 06/25/15 1205 06/25/15 2032 06/26/15 0446  BP: 117/48 126/53 117/51 149/62  Pulse: 74 69 76 85  Temp: 98.5 F (36.9 C) 98 F (36.7 C) 97.4 F (36.3 C) 97.9 F (36.6 C)  TempSrc:   Oral Oral  Resp: 16 19 20 18   Height:      Weight:      SpO2: 94% 97% 94% 94%    Intake/Output Summary (Last 24 hours) at 06/26/15 0951 Last data filed at 06/26/15 0828  Gross per 24 hour  Intake      0 ml  Output    750 ml  Net   -750 ml    LABS: Basic Metabolic Panel:  Recent Labs  40/98/1110/29/16 0713  NA 140  K 4.0  CL 105  CO2 24  GLUCOSE 81  BUN 23*  CREATININE 0.93  CALCIUM 9.4   Liver Function Tests: No results for input(s): AST, ALT, ALKPHOS, BILITOT, PROT, ALBUMIN in the last 72 hours. No results for input(s): LIPASE, AMYLASE in the last 72 hours. CBC:  Recent Labs  06/24/15 0352  WBC 9.7  HGB 11.3*  HCT 33.5*  MCV 89.5  PLT 371   Cardiac Enzymes: No results for input(s): CKTOTAL, CKMB, CKMBINDEX, TROPONINI in the last 72 hours. BNP: Invalid input(s): POCBNP D-Dimer: No results for input(s): DDIMER in the last 72 hours. Hemoglobin A1C: No results for input(s): HGBA1C in the last 72 hours. Fasting Lipid Panel: No results for input(s): CHOL, HDL, LDLCALC, TRIG, CHOLHDL, LDLDIRECT in the last 72 hours. Thyroid Function Tests: No results for input(s): TSH, T4TOTAL, T3FREE, THYROIDAB in the last 72 hours.  Invalid input(s): FREET3 Anemia Panel: No results for input(s): VITAMINB12, FOLATE, FERRITIN, TIBC, IRON, RETICCTPCT in the last 72 hours.   PHYSICAL EXAM General: Well developed, well nourished, in no acute distress HEENT:  Normocephalic and atramatic Neck:  No JVD.  Lungs: Clear bilaterally to auscultation and percussion. Heart: HRRR . Normal S1 and S2 without gallops or  murmurs.  Abdomen: Bowel sounds are positive, abdomen soft and non-tender  Msk:  Back normal, normal gait. Normal strength and tone for age. Extremities: No clubbing, cyanosis or edema.   Neuro: Alert and oriented X 3. Psych:  Good affect, responds appropriately  TELEMETRY: Sinus rhythm  ASSESSMENT AND PLAN: Patient is feeling well no chest pain or shortness of breath admitted with mildly elevated troponin without any significant ekg changes. we will sign off as no further workup is needed.  Active Problems:   Acute encephalopathy    Laurier NancyKHAN,SHAUKAT A, MD, [facility]qu[facility] Regional [facility] CenterFACC 06/26/2015 9:51 AM

## 2015-06-27 LAB — URINE CULTURE: Culture: 1000

## 2015-06-27 MED ORDER — NAPROXEN 500 MG PO TABS: 250.0000 mg | ORAL_TABLET | Freq: Three times a day (TID) | ORAL | Status: DC | PRN

## 2015-06-27 NOTE — Progress Notes (Signed)
[facility] CLINIC INFECTIOUS DISEASE PROGRESS NOTE Date of Admission:  06/20/2015     ID: JAKYRAH HOLLADAY is a 81 y.o. female with complicated UTI with EColi and aerococcus  Active Problems:   Acute encephalopathy   Subjective: No fevers, remains confused. Has foley in place still  ROS  Eleven systems are reviewed and negative except per hpi  Medications:  Antibiotics Given (last 72 hours)    Date/Time Action Medication Dose Rate   06/24/15 1723 Given   ertapenem (INVANZ) 1 g in sodium chloride 0.9 % 50 mL IVPB 1 g 100 mL/hr   06/24/15 1803 Given   vancomycin (VANCOCIN) IVPB 750 mg/150 ml premix 750 mg 150 mL/hr   06/25/15 1702 Given   vancomycin (VANCOCIN) IVPB 750 mg/150 ml premix 750 mg 150 mL/hr   06/25/15 1703 Given   ertapenem (INVANZ) 1 g in sodium chloride 0.9 % 50 mL IVPB 1 g 100 mL/hr   06/26/15 1850 Given   vancomycin (VANCOCIN) IVPB 750 mg/150 ml premix 750 mg 150 mL/hr   06/26/15 2135 Given   ertapenem (INVANZ) 1 g in sodium chloride 0.9 % 50 mL IVPB 1 g 100 mL/hr     . amLODipine  5 mg Oral Daily  . aspirin  81 mg Oral Daily  . chlorthalidone  25 mg Oral Daily  . cholecalciferol  2,000 Units Oral Daily  . ertapenem (INVANZ) IV  1 g Intravenous Q24H  . heparin  5,000 Units Subcutaneous 3 times per day  . hydrocortisone   Rectal TID  . latanoprost  1 drop Both Eyes QHS  . lidocaine  1 patch Transdermal Q24H  . multivitamin with minerals  1 tablet Oral Daily  . naproxen  250 mg Oral TID WC  . oxybutynin  10 mg Oral Daily  . pantoprazole  40 mg Oral Daily  . polyethylene glycol  17 g Oral Daily  . QUEtiapine  25 mg Oral QHS  . senna-docusate  1 tablet Oral BID  . sodium chloride  3 mL Intravenous Q12H  . sodium chloride  3 mL Intravenous Q12H  . trandolapril  1 mg Oral Daily  . triamcinolone cream  1 application Topical BID  . vancomycin  750 mg Intravenous Q24H    Objective: Vital signs in last 24 hours: Temp:  [97.8 F (36.6 C)-98.3 F (36.8 C)] 98.3  F (36.8 C) (10/31 0521) Pulse Rate:  [71-83] 73 (10/31 1246) Resp:  [17-22] 17 (10/31 1246) BP: (97-142)/(48-99) 142/48 mmHg (10/31 1246) SpO2:  [95 %] 95 % (10/31 1246) Constitutional: Thin frail, sleepy HENT: [place]/AT, PERRLA, no scleral icterus Mouth/Throat: Oropharynx is clear and dry. No oropharyngeal exudate.  Cardiovascular: distant  Pulmonary/Chest: Effort normal and breath sounds normal. No respiratory distress. has no wheezes.  Neck supple, no nuchal rigidity Abdominal: Soft. Bowel sounds are normal. exhibits no distension. There is no tenderness.  Lymphadenopathy: no cervical adenopathy. No axillary adenopathy Neurological: alert and oriented to person, place, and time.  Skin: bil legs in uawraps Psychiatric: flat affect,   Lab Results  Recent Labs  06/25/15 0713  NA 140  K 4.0  CL 105  CO2 24  BUN 23*  CREATININE 0.93    Microbiology: Results for orders placed or performed during the hospital encounter of 06/20/15  Urine culture     Status: None   Collection Time: 06/20/15  4:25 AM  Result Value Ref Range Status   Specimen Description URINE, CATHETERIZED  Final   Special Requests vanco ertapenem  Final   Culture   Final    1,000 COLONIES/mL PSEUDOMONAS AERUGINOSA 80,000 COLONIES/ml CANDIDA GLABRATA    Report Status 06/27/2015 FINAL  Final   Organism ID, Bacteria PSEUDOMONAS AERUGINOSA  Final      Susceptibility   Pseudomonas aeruginosa - MIC*    CEFTAZIDIME 8 SENSITIVE Sensitive     CIPROFLOXACIN >=4 RESISTANT Resistant     GENTAMICIN 4 SENSITIVE Sensitive     IMIPENEM 2 SENSITIVE Sensitive     PIP/TAZO Value in next row Sensitive      SENSITIVE32    LEVOFLOXACIN Value in next row Resistant      RESISTANT>=8    * 1,000 COLONIES/mL PSEUDOMONAS AERUGINOSA  MRSA PCR Screening     Status: None   Collection Time: 06/21/15 11:05 AM  Result Value Ref Range Status   MRSA by PCR NEGATIVE NEGATIVE Final    Comment:        The GeneXpert MRSA Assay  (FDA approved for NASAL specimens only), is one component of a comprehensive MRSA colonization surveillance program. It is not intended to diagnose MRSA infection nor to guide or monitor treatment for MRSA infections.     Studies/Results: No results found.  Assessment/Plan: Della Gooancy L Grecco is a 81 y.o. female with a history of recent GI bleed, macular degeneration, hypertension, spinal stenosis and hyperlipidemia who was recently hospitalized for GI bleed. At that time was noted to have urinary retention and urinary tract infection. She was growing ESBL Escherichia coli as well as aerococcus. She was discharged with picc for ertapenem. Patient was readmitted 10.24 from the skilled nursing facility for agitation she been trying to pull the PICC line out. Repeat UA still showed TNTC wbc. Fu ucx grew 1000 Pseudoomonas and 80 K candida glabrata Of note her ESBL E coli was sensitive to macrobid and her renal rxn is nml. She had to have foley replaced apparently for urinary retention. Her WBC is nml, no fevers. Renal USS is nml.   So far pathogens isolate in urine include: ESBL E coli, Aerococcus, psuedomonas and candida glabrata Day 6 of vanco Day 13-14 (at least) of ertapenem  Recommendations Repeat ua on 10/28 is negative with no wbx noted At this point would dc abx and follow. She has a foley at this point and would suggest seeing if can do without it. If recurs will consider fosfomycin which should cover the pseudomonas, ecoli and aerococcus. Thank you very much for the consult. Will follow with you.  Ariell Gunnels   06/27/2015, 2:59 PM

## 2015-06-27 NOTE — Progress Notes (Signed)
Nutrition Follow-up   Severe malnutrition in context of chronic illness INTERVENTION:    Meals and Snacks: Cater to patient preferences Medical Food Supplement Therapy: recommend addition of Magic Cup and Mighty Shakes TID with meals Coordination of Care: nsg order placed for feeding assistance as well as reminder for documentation of po intake as pt is on Isolation   NUTRITION DIAGNOSIS:   Inadequate oral intake related to acute illness as evidenced by per patient/family report.  GOAL:   Patient will meet greater than or equal to 90% of their needs   MONITOR:    (Energy intake, Electrolyte and renal profile, Anthropometric)  REASON FOR ASSESSMENT:   Malnutrition Screening Tool    ASSESSMENT:    Pt alert but confused, admitted with acute encephlaophathy due to UTI; noted possible discharge tomorrow  Diet Order:  Diet Heart Room service appropriate?: Yes; Fluid consistency:: Thin   Energy Intake: recorded po intake 10% at dinner last night, bites at lunch and 50% at breakfast yesterday. No other po intake documented Observed lunch tray today, pt ate bites of open faced sandwich and mashed potatoes at lunch. Did not appear to drink any fluids.   Nutrition Focused Physical Exam: Nutrition-Focused physical exam completed. Findings are mild to severe fat depletion, moderate to severe muscle depletion, and non-pitting edema. Unable to assess LE; calves wrapped up in bandages, pt position making it difficult to assess thighs  Height:   Ht Readings from Last 1 Encounters:  06/20/15 5\' 4"  (1.626 m)    Weight: per wt encounters 13% weight loss in the last 4 months  Wt Readings from Last 1 Encounters:  06/20/15 144 lb 14.4 oz (65.726 kg)   Wt Readings from Last 10 Encounters:  06/20/15 144 lb 14.4 oz (65.726 kg)  06/09/15 153 lb 11.2 oz (69.718 kg)  01/31/15 165 lb (74.844 kg)   BMI:  Body mass index is 24.86 kg/(m^2).  Estimated Nutritional Needs:   Kcal:  BEE 1010  kcals (IF 1.0-1.2, AF 1.3)1313-1575 kcals/d.   Protein:  (1.0-1.3 g/kg) 52-68 g/d  Fluid:  (30-8135ml/kg) 1560-18220ml/d  EDUCATION NEEDS:   No education needs identified at this time  HIGH Care Level  Romelle Starcherate Taylan Marez MS, RD, LDN [phone] Pager

## 2015-06-27 NOTE — Progress Notes (Signed)
Patient very confused. Bedbound. Foley in place. IVF infusing well. Received IV antibiotics. NO complaints. Did have an episode of agitation during the night becoming combative and pulled out IV. New IV started and patient calm the rest of the night.

## 2015-06-27 NOTE — Clinical Social Work Note (Signed)
Jomarie LongsJoseph with [employer] came to see patient today and requested an update. MD has documented discharge tomorrow. CSW has updated Jomarie LongsJoseph. York SpanielMonica Lanell Dubie MSW,LCSW [phone]

## 2015-06-27 NOTE — Progress Notes (Signed)
Patient BP 96/71 this am, BP meds held. Dr. Luberta MutterKonidena notified. No new orders at this time. Will cont to assess. Patient [name] on telemetry. [name]

## 2015-06-27 NOTE — Consult Note (Signed)
WOC wound follow up Reason for Consult:Bilateral Duke boots application to lower extremities. Changed on Monday and Thursday.  Wound type:Chronic venous insufficiency with ulcerations to bilateral anterior lower legs.  Pressure Ulcer POA: N/A Measurement: Left pretibial leg with 2 nonintact lesions. Distal 1 cm x 0.5 cm x 0.1 cm Proximal 0.5 cm x 0.5 cm x 0.1 cm Right anterior calf 3 lesions 0.5 cm in diameter below knee, proximal 1 cm x 0.5 cm x 0.1 cm distal 0.5 cm x 0.3 cm x 0.1 cm Wound bed:100% pink and moist Drainage (amount, consistency, odor) Scant serous weeping Periwound:Intact Dressing procedure/placement/frequency:Cleanse legs with soap and water. Mepitel silicone contact layer to open wounds, Cover with ABD pad. Wrap from just below toes to just below knee with Kerlix, then Coban. Place stocking over Coban to hold in place, per patient. Change Monday and Thursday.  WOC team will follow.  Maple HudsonKaren Khallid Pasillas RN BSN CWON Pager [phone]

## 2015-06-27 NOTE — Progress Notes (Signed)
   SUBJECTIVE: Pt lying in bed comfortably. No CP or SOB.    Filed Vitals:   06/26/15 0446 06/26/15 1133 06/26/15 2054 06/27/15 0521  BP: 149/62 142/58 129/99 136/59  Pulse: 85 81 71 80  Temp: 97.9 F (36.6 C) 97.7 F (36.5 C) 97.8 F (36.6 C) 98.3 F (36.8 C)  TempSrc: Oral Oral Oral   Resp: 18 16 22 19   Height:      Weight:      SpO2: 94% 94% 95% 95%    Intake/Output Summary (Last 24 hours) at 06/27/15 0851 Last data filed at 06/27/15 0523  Gross per 24 hour  Intake 1394.17 ml  Output   1500 ml  Net -105.83 ml    LABS: Basic Metabolic Panel:  Recent Labs  16/05/9609/29/16 0713  NA 140  K 4.0  CL 105  CO2 24  GLUCOSE 81  BUN 23*  CREATININE 0.93  CALCIUM 9.4   Liver Function Tests: No results for input(s): AST, ALT, ALKPHOS, BILITOT, PROT, ALBUMIN in the last 72 hours. No results for input(s): LIPASE, AMYLASE in the last 72 hours. CBC: No results for input(s): WBC, NEUTROABS, HGB, HCT, MCV, PLT in the last 72 hours. Cardiac Enzymes: No results for input(s): CKTOTAL, CKMB, CKMBINDEX, TROPONINI in the last 72 hours. BNP: Invalid input(s): POCBNP D-Dimer: No results for input(s): DDIMER in the last 72 hours. Hemoglobin A1C: No results for input(s): HGBA1C in the last 72 hours. Fasting Lipid Panel: No results for input(s): CHOL, HDL, LDLCALC, TRIG, CHOLHDL, LDLDIRECT in the last 72 hours. Thyroid Function Tests: No results for input(s): TSH, T4TOTAL, T3FREE, THYROIDAB in the last 72 hours.  Invalid input(s): FREET3 Anemia Panel: No results for input(s): VITAMINB12, FOLATE, FERRITIN, TIBC, IRON, RETICCTPCT in the last 72 hours.   PHYSICAL EXAM General: Well developed, well nourished, in no acute distress HEENT:  Normocephalic and atramatic Neck:  No JVD.  Lungs: Clear bilaterally to auscultation and percussion. Heart: HRRR . Normal S1 and S2 without gallops or murmurs.  Abdomen: Bowel sounds are positive, abdomen soft and non-tender  Msk:  Back normal,  normal gait. Normal strength and tone for age. Extremities: No clubbing, cyanosis or edema.   Neuro: Alert and oriented X 3. Psych:  Good affect, responds appropriately  TELEMETRY: Reviewed telemetry pt in [name]  ASSESSMENT AND PLAN:  1. Mildly elevated troponin 2/2 demand ischemia. No CP or EKG changes. Echo shows normal LVEF and WM   Pt given f/u in office 11/8 at 2pm.    Patient and plan discussed with supervising provider, Dr. Adrian BlackwaterShaukat Khan, who agrees with above findings.   Alinda SierrasEileen A. Margarito Courseromano, PA-C [facility]  06/27/2015 8:51 AM

## 2015-06-27 NOTE — Progress Notes (Signed)
PT Cancellation Note  Patient Details Name: [name] MRN: [id number] DOB: [birth year]   Cancelled Treatment:    Reason Eval/Treat Not Completed: Other (comment) (See PT note for further details ) Therapy treatment attempted this morning but pt is too confused to participate in therapy tasks. She has limited ability to follow simple commands at this time. Pt not agitated. Will attempt at later time/date when cognition has improved.    Benna DunksCasey Ozias Dicenzo 06/27/2015, 10:15 AM  Benna Dunksasey Kethan Papadopoulos, SPT. [phone]

## 2015-06-27 NOTE — Care Management (Signed)
ID has recommended discontinuing IV antibiotics. Recommend to see if patient can void without the foley.  It was reported last weeketo CM that [facility] staff was to have cone and assessed patient for readmission to [facility].  Unsure if this was done.  Spoke with primary nurse to discuss plan for foley with attending.  Foley was inserted over the weekend for urinary retention.

## 2015-06-27 NOTE — Progress Notes (Signed)
[facility] [place] [facility]                                                                                                                                                                                            Patient Demographics   [name], is a 81 y.o. female, DOB - 1913-06-06, ZOX:[id number]  Admit date - 06/20/2015   Admitting Physician Auburn Bilberry, MD  Outpatient Primary MD for the patient is Lauro Regulus., MD   LOS - 7  Subjective: pleasantly confused. Review of Systems:   Confused  Vitals:   Filed Vitals:   06/26/15 1133 06/26/15 2054 06/27/15 0521 06/27/15 0908  BP: 142/58 129/99 136/59 97/61  Pulse: 81 71 80 83  Temp: 97.7 F (36.5 C) 97.8 F (36.6 C) 98.3 F (36.8 C)   TempSrc: Oral Oral    Resp: Height:      Weight:      SpO2: 94% 95% 95%     Wt Readings from Last 3 Encounters:  06/20/15 65.726 kg (144 lb 14.4 oz)  06/09/15 69.718 kg (153 lb 11.2 oz)  01/31/15 74.844 kg (165 lb)     Intake/Output Summary (Last 24 hours) at 06/27/15 1135 Last data filed at 06/27/15 0523  Gross per 24 hour  Intake 1394.17 ml  Output   1500 ml  Net -105.83 ml    Physical Exam:   GENERAL: Confused and agitated  HEAD, EYES, EARS, NOSE AND THROAT: Atraumatic, normocephalic. Extraocular muscles are intact. Pupils equal and reactive to light. Sclerae anicteric. No conjunctival injection. No oro-pharyngeal erythema.  NECK: Supple. There is no jugular venous distention. No bruits, no lymphadenopathy, no thyromegaly.  HEART: Regular rate and rhythm,. No murmurs, no rubs, no clicks.  LUNGS: Clear to auscultation bilaterally. No rales or rhonchi. No wheezes.  ABDOMEN: Soft, flat, nontender, nondistended. Has good bowel sounds. No hepatosplenomegaly appreciated.  EXTREMITIES: No evidence of any cyanosis, clubbing, or peripheral edema.  +2 pedal and radial pulses bilaterally.  NEUROLOGIC: The patient is confused,  awake, and no focal motor or sensory deficits appreciated bilaterally.  SKIN: Moist and warm with no rashes appreciated.  Psych: Confused LN: No inguinal LN enlargement    Antibiotics   Anti-infectives    Start     Dose/Rate Route Frequency Ordered Stop   06/21/15 1800  ertapenem (INVANZ) 1 g in sodium chloride 0.9 % 50 mL IVPB     1 g 100 mL/hr over 30 Minutes Intravenous Every 24 hours 06/21/15 0935     06/21/15 1800  vancomycin (VANCOCIN) IVPB 750 mg/150 ml premix  750 mg 150 mL/hr over 60 Minutes Intravenous Every 24 hours 06/21/15 0935     06/21/15 1000  vancomycin (VANCOCIN) IVPB 750 mg/150 ml premix     750 mg 150 mL/hr over 60 Minutes Intravenous  Once 06/21/15 0935 06/21/15 1200   06/20/15 1600  ertapenem (INVANZ) 1 g in sodium chloride 0.9 % 50 mL IVPB  Status:  Discontinued     1 g 100 mL/hr over 30 Minutes Intravenous Every 24 hours 06/20/15 1314 06/21/15 0935      Medications   Scheduled Meds: . amLODipine  5 mg Oral Daily  . aspirin  81 mg Oral Daily  . chlorthalidone  25 mg Oral Daily  . cholecalciferol  2,000 Units Oral Daily  . ertapenem (INVANZ) IV  1 g Intravenous Q24H  . heparin  5,000 Units Subcutaneous 3 times per day  . hydrocortisone   Rectal TID  . latanoprost  1 drop Both Eyes QHS  . lidocaine  1 patch Transdermal Q24H  . multivitamin with minerals  1 tablet Oral Daily  . naproxen  250 mg Oral TID WC  . oxybutynin  10 mg Oral Daily  . pantoprazole  40 mg Oral Daily  . polyethylene glycol  17 g Oral Daily  . QUEtiapine  25 mg Oral QHS  . senna-docusate  1 tablet Oral BID  . sodium chloride  3 mL Intravenous Q12H  . sodium chloride  3 mL Intravenous Q12H  . trandolapril  1 mg Oral Daily  . triamcinolone cream  1 application Topical BID  . vancomycin  750 mg Intravenous Q24H   Continuous Infusions:  PRN Meds:.sodium chloride, acetaminophen **OR** acetaminophen, benzonatate, haloperidol lactate, HYDROcodone-homatropine, ondansetron **OR**  ondansetron (ZOFRAN) IV, polyvinyl alcohol, pramoxine-mineral oil-zinc, sodium chloride   Data Review:   Micro Results Recent Results (from the past 240 hour(s))  Urine culture     Status: None   Collection Time: 06/20/15  4:25 AM  Result Value Ref Range Status   Specimen Description URINE, CATHETERIZED  Final   Special Requests vanco ertapenem  Final   Culture   Final    1,000 COLONIES/mL PSEUDOMONAS AERUGINOSA 80,000 COLONIES/ml CANDIDA GLABRATA    Report Status 06/27/2015 FINAL  Final   Organism ID, Bacteria PSEUDOMONAS AERUGINOSA  Final      Susceptibility   Pseudomonas aeruginosa - MIC*    CEFTAZIDIME 8 SENSITIVE Sensitive     CIPROFLOXACIN >=4 RESISTANT Resistant     GENTAMICIN 4 SENSITIVE Sensitive     IMIPENEM 2 SENSITIVE Sensitive     PIP/TAZO Value in next row Sensitive      SENSITIVE32    LEVOFLOXACIN Value in next row Resistant      RESISTANT>=8    * 1,000 COLONIES/mL PSEUDOMONAS AERUGINOSA  MRSA PCR Screening     Status: None   Collection Time: 06/21/15 11:05 AM  Result Value Ref Range Status   MRSA by PCR NEGATIVE NEGATIVE Final    Comment:        The GeneXpert MRSA Assay (FDA approved for NASAL specimens only), is one component of a comprehensive MRSA colonization surveillance program. It is not intended to diagnose MRSA infection nor to guide or monitor treatment for MRSA infections.     Radiology Reports Ct Head Wo Contrast  06/20/2015  CLINICAL DATA:  Altered mental status. EXAM: CT HEAD WITHOUT CONTRAST TECHNIQUE: Contiguous axial images were obtained from the base of the skull through the vertex without intravenous contrast. COMPARISON:  01/31/2015. FINDINGS: Diffusely enlarged ventricles  and subarachnoid spaces. Patchy white matter low density in both cerebral hemispheres. Stable old right thalamic lacunar infarct. No intracranial hemorrhage, mass lesion or CT evidence of acute infarction. The anterior aspect of the left middle nasal turbinate is  pneumatized. Otherwise, unremarkable bones and included paranasal sinuses. IMPRESSION: 1. No acute abnormality. 2. Stable atrophy and chronic small vessel white matter ischemic changes. 3. Stable old right thalamic lacunar infarct. Electronically Signed   By: Beckie Salts M.D.   On: 06/20/2015 10:05   Nm Gi Blood Loss  06/10/2015  CLINICAL DATA:  Lower gastrointestinal bleeding. EXAM: NUCLEAR MEDICINE GASTROINTESTINAL BLEEDING SCAN TECHNIQUE: Sequential abdominal images were obtained following intravenous administration of Tc-72m labeled red blood cells. RADIOPHARMACEUTICALS:  21.16 mCi Tc-36m in-vitro labeled red cells. COMPARISON:  None. FINDINGS: No abnormal accumulation is seen to suggest active gastrointestinal bleeding. IMPRESSION: No scintigraphic evidence of active gastrointestinal bleeding. Electronically Signed   By: Lupita Raider, M.D.   On: 06/10/2015 10:37   US Renal  06/20/2015  CLINICAL DATA:  Urinary tract infection without hematuria EXAM: RENAL / URINARY TRACT ULTRASOUND COMPLETE COMPARISON:  None. FINDINGS: Right Kidney: Length: 9.6 cm. Echogenicity within normal limits. No mass or hydronephrosis visualized. Left Kidney: Length: 9.6 cm. Echogenicity within normal limits. No mass or hydronephrosis visualized. Bladder: Decompressed by Foley catheter and therefore not evaluated IMPRESSION: Normal renal ultrasound Electronically Signed   By: Esperanza Heir M.D.   On: 06/20/2015 13:55   Dg Chest Portable 1 View  06/20/2015  CLINICAL DATA:  EMS states pt was diagnosed with a UTI approximately x1 week ago and was given abx, which has been completed. EMS reports pt has been agitated with staff more frequently and has requested to been seen by a medical professional. Staff at patient is a nursing facility reports an elevated temperature, not noted currently. Patient is currently disoriented. EXAM: PORTABLE CHEST 1 VIEW COMPARISON:  06/14/2015 FINDINGS: Cardiac silhouette is normal in size and  configuration. Aorta is mildly uncoiled. No mediastinal or hilar masses or evidence of adenopathy. Lungs are mildly hyperexpanded. Mild scarring and/ or subsegmental atelectasis at the lung bases is stable. Lungs are otherwise clear. Bony thorax is demineralized. There are advanced arthropathic changes of both shoulder joints, stable. IMPRESSION: No acute cardiopulmonary disease. Electronically Signed   By: Amie Portland M.D.   On: 06/20/2015 09:34   Dg Chest Port 1 View  06/14/2015  CLINICAL DATA:  Cough. EXAM: PORTABLE CHEST 1 VIEW COMPARISON:  June 03, 2012. FINDINGS: The heart size and mediastinal contours are within normal limits. No pneumothorax or pleural effusion is noted. Mild bibasilar subsegmental atelectasis is noted. Mild degenerative changes seen involving both glenohumeral joints. IMPRESSION: Mild bibasilar subsegmental atelectasis. Electronically Signed   By: Lupita Raider, M.D.   On: 06/14/2015 14:08   Dg Abd 2 Views  06/13/2015  CLINICAL DATA:  Subsequent encounter for several day history of rectal bleeding. EXAM: ABDOMEN - 2 VIEW COMPARISON:  None. FINDINGS: Upright film shows some probable atelectasis at the left lung base. No evidence for intraperitoneal free air. Supine film shows no gaseous bowel dilatation to suggest obstruction. Bones are diffusely demineralized. IMPRESSION: Negative. Electronically Signed   By: Kennith Center M.D.   On: 06/13/2015 08:03     CBC  Recent Labs Lab 06/20/15 1510 06/21/15 0828 06/24/15 0352  WBC 10.0 8.6 9.7  HGB 11.1* 11.3* 11.3*  HCT 33.8* 33.0* 33.5*  PLT 336 330 371  MCV 90.5 89.9 89.5  MCH 29.8 30.9  30.1  MCHC 32.9 34.4 33.6  RDW 13.9 14.1 14.3  LYMPHSABS  --  1.4  --   MONOABS  --  0.6  --   EOSABS  --  0.2  --   BASOSABS  --  0.1  --     Chemistries   Recent Labs Lab 06/20/15 1510 06/21/15 0828 06/23/15 0426 06/25/15 0713  NA  --  140  --  140  K  --  3.6  --  4.0  CL  --  105  --  105  CO2  --  24  --  24   GLUCOSE  --  88  --  81  BUN  --  18  --  23*  CREATININE 0.61 0.66 0.65 0.93  CALCIUM  --  9.2  --  9.4   ------------------------------------------------------------------------------------------------------------------ estimated creatinine clearance is 30.8 mL/min (by C-G formula based on Cr of 0.93). ------------------------------------------------------------------------------------------------------------------ No results for input(s): HGBA1C in the last 72 hours. ------------------------------------------------------------------------------------------------------------------ No results for input(s): CHOL, HDL, LDLCALC, TRIG, CHOLHDL, LDLDIRECT in the last 72 hours. ------------------------------------------------------------------------------------------------------------------ No results for input(s): TSH, T4TOTAL, T3FREE, THYROIDAB in the last 72 hours.  Invalid input(s): FREET3 ------------------------------------------------------------------------------------------------------------------ No results for input(s): VITAMINB12, FOLATE, FERRITIN, TIBC, IRON, RETICCTPCT in the last 72 hours.  Coagulation profile No results for input(s): INR, PROTIME in the last 168 hours.  No results for input(s): DDIMER in the last 72 hours.  Cardiac Enzymes  Recent Labs Lab 06/20/15 1510 06/20/15 1904 06/21/15 0138  TROPONINI 0.21* 0.21* 0.22*   ------------------------------------------------------------------------------------------------------------------ Invalid input(s): POCBNP    Assessment & Plan   IMPRESSION AND PLAN: Patient is a 81 year old white female who was sent from skilled nursing facility for agitation  1. Acute encephalopathy due to urinary tract infection, urine cultures showing 1000 colonies of Pseudomonas and 80,000 candida:appreciate ID input to adjust abx at d/c if at all she needs any? L23. Hypertension: Continue amlodipine  4. Elevated troponin likely  due to demand ischemia , echo shows EF 60-65%. 5. CODE STATUS is DO NOT RESUSCITATE likely d/c to [facility] tomorrow,.    Code Status Orders        Start     Ordered   06/20/15 1315  Do not attempt resuscitation (DNR)   Continuous    Question Answer Comment  In the event of cardiac or respiratory ARREST Do not call a "code blue"   In the event of cardiac or respiratory ARREST Do not perform Intubation, CPR, defibrillation or ACLS   In the event of cardiac or respiratory ARREST Use medication by any route, position, wound care, and other measures to relive pain and suffering. May use oxygen, suction and manual treatment of airway obstruction as needed for comfort.      06/20/15 1314    Advance Directive Documentation        Most Recent Value   Type of Advance Directive  Healthcare Power of Attorney   Pre-existing out of facility DNR order (yellow form or pink MOST form)     "MOST" Form in Place?                DVT Prophylaxis  heparin  Lab Results  Component Value Date   PLT 371 06/24/2015     Time Spent in minutes  25 minutes   Jamie Hafford M.D on 06/27/2015 at 11:35 AM  Between 7am to 6pm - Pager - [phone]  After 6pm go to [email or web address] - password [email or web address] [email or web address]  [facility]  Office  [phone]

## 2015-06-28 DIAGNOSIS — E43 Unspecified severe protein-calorie malnutrition: Secondary | ICD-10-CM | POA: Insufficient documentation

## 2015-06-28 LAB — CBC
HEMATOCRIT: 34.7 % — AB (ref 35.0–47.0)
HEMOGLOBIN: 11.4 g/dL — AB (ref 12.0–16.0)
MCH: 29.7 pg (ref 26.0–34.0)
MCHC: 33 g/dL (ref 32.0–36.0)
MCV: 90 fL (ref 80.0–100.0)
Platelets: 371 10*3/uL (ref 150–440)
RBC: 3.85 MIL/uL (ref 3.80–5.20)
RDW: 14.5 % (ref 11.5–14.5)
WBC: 9.1 10*3/uL (ref 3.6–11.0)

## 2015-06-28 LAB — PLATELET COUNT: PLATELETS: 346 10*3/uL (ref 150–440)

## 2015-06-28 MED ORDER — QUETIAPINE FUMARATE 25 MG PO TABS: 25.0000 mg | ORAL_TABLET | Freq: Every day | ORAL | Status: AC

## 2015-06-28 NOTE — Care Management (Signed)
Per CSW- patient will not be able to discahrge back to [facility].  A bed has been accepted at [facility]. Lleft message for POA- Garnette GunnerRoger Vaughn regarding IM

## 2015-06-28 NOTE — Discharge Summary (Signed)
[name], is a 81 y.o. female  DOB 02-Feb-1913  MRN [id number].  Admission date:  06/20/2015  Admitting Physician  Auburn Bilberry, MD  Discharge Date:  06/28/2015   Primary MD  Lauro Regulus., MD  Recommendations for primary care physician for things to follow:   Follow up  Dr. Sampson Goon in 10 days   Admission Diagnosis  Delirium [R41.0] Agitation [R45.1] UTI (lower urinary tract infection) [N39.0] Elevated troponin [R79.89]   Discharge Diagnosis  Delirium [R41.0] Agitation [R45.1] UTI (lower urinary tract infection) [N39.0] Elevated troponin [R79.89]    Active Problems:   Acute encephalopathy   Protein-calorie malnutrition, severe      Past Medical History  Diagnosis Date  . Macular degeneration   . Osteoporosis   . Hypertension   . Spinal stenosis of lumbar region   . Hyperlipidemia   . GI bleed     Past Surgical History  Procedure Laterality Date  . Joint replacement Right     hip  . Abdominal hysterectomy         History of present illness and  Hospital Course:     Kindly see H&P for history of present illness and admission details, please review complete Labs, Consult reports and Test reports for all details in brief  HPI  from the history and physical done on the day of admission 81 year old female patient with macular degeneration, hypertension, hyperlipidemia, recent history of GI bleed came in secondary to agitation, persistent urinary infection. Admitted to hospitalist service for recurrent UTI: Patient was here recently and had ESBL UTI and discharged to home place with IV antibiotics.  Hospital Course  #1 encephalopathy secondary to UTI: ID consult obtained with Dr. Sampson Goon, returns 1 or 2 Fahrenheit at skilled nursing. Ultrasound was normal. WBC is normal. Urinalysis  showed a lot of WBC. Dr. Sampson Goon recommended repeating the urine cultures, initially she was started on vancomycin and ertapenem. Repeat urine cultures showed only less than 100,000 colonies of Pseudomonas,. So Dr. Sampson Goon recommended discontinuing the antibiotics and follow. Patient has a Foley secondary to persistent urine retention. Unable to discontinue Foley due to that. The plan is to discharge her to skilled nursing facility. If she gets any symptoms of urine infection urine cultures to be done and patient needs to see Dr. Sampson Goon at that time.  #2 anxiety agitation: Patient is 81 year old and she started to have dementia with agitation. Seroquel restarted during this admission and her prescription for that. #3 hypertension: Controlled. #4 history of arthritis: Patient can take naproxen,avoid narcotics. #5 mildly elevated troponins likely due to secondary demand ischemia: No EKG changes. Echo showed normal LVEF. Followed by Dr. Jaquita Folds Patient never had chest pain.  Discharge Condition: Stable.   Follow UP      Discharge Instructions  and  Discharge Medications        Medication List    STOP taking these medications        ertapenem 1 g in sodium chloride 0.9 % 50 mL     HYDROcodone-acetaminophen 5-325 MG tablet  Commonly known as:  NORCO/VICODIN     HYDROcodone-homatropine 5-1.5 MG/5ML syrup  Commonly known as:  HYCODAN      TAKE these medications        amLODipine 5 MG tablet  Commonly known as:  NORVASC  Take 5 mg by mouth daily.     benzonatate 100 MG capsule  Commonly known as:  TESSALON  Take 100 mg by mouth 3 (three) times daily  as needed for cough.     chlorthalidone 25 MG tablet  Commonly known as:  HYGROTON  Take 25 mg by mouth daily.     hydrocortisone 2.5 % rectal cream  Commonly known as:  ANUSOL-HC  Place rectally 3 (three) times daily.     hydroxypropyl methylcellulose / hypromellose 2.5 % ophthalmic solution  Commonly known as:   ISOPTO TEARS / GONIOVISC  Place 1 drop into both eyes 2 (two) times daily.     latanoprost 0.005 % ophthalmic solution  Commonly known as:  XALATAN  Place 1 drop into both eyes at bedtime.     lidocaine 5 %  Commonly known as:  LIDODERM  Place 1 patch onto the skin daily. Remove & Discard patch within 12 hours or as directed by MD     moexipril 15 MG tablet  Commonly known as:  UNIVASC  Take 15 mg by mouth daily.     naproxen 250 MG tablet  Commonly known as:  NAPROSYN  Take 250 mg by mouth 3 (three) times daily with meals.     oxybutynin 10 MG 24 hr tablet  Commonly known as:  DITROPAN-XL  Take 10 mg by mouth daily.     pantoprazole 40 MG tablet  Commonly known as:  PROTONIX  Take 1 tablet (40 mg total) by mouth daily.     polyethylene glycol packet  Commonly known as:  MIRALAX / GLYCOLAX  Take 17 g by mouth every other day.     PRESERVISION AREDS 2 Caps  Take 1 capsule by mouth daily.     QUEtiapine 25 MG tablet  Commonly known as:  SEROQUEL  Take 1 tablet (25 mg total) by mouth at bedtime.     SYSTANE 0.4-0.3 % Soln  Generic drug:  Polyethyl Glycol-Propyl Glycol  Place 1 drop into both eyes as needed (dry eyes).     triamcinolone cream 0.1 %  Commonly known as:  KENALOG  Apply 1 application topically 2 (two) times daily. To face     TUCKS HEMORRHOIDAL 1-12.5 % rectal ointment  Generic drug:  pramoxine-mineral oil-zinc  Place 1 application rectally 2 (two) times daily as needed for itching.     Vitamin D3 2000 UNITS Tabs  Take 1 capsule by mouth daily.          Diet and Activity recommendation: See Discharge Instructions above   Consults obtained - ID consult Cardiology consult  Major procedures and Radiology Reports - PLEASE review detailed and final reports for all details, in brief -     Ct Head Wo Contrast  06/20/2015  CLINICAL DATA:  Altered mental status. EXAM: CT HEAD WITHOUT CONTRAST TECHNIQUE: Contiguous axial images were obtained from  the base of the skull through the vertex without intravenous contrast. COMPARISON:  01/31/2015. FINDINGS: Diffusely enlarged ventricles and subarachnoid spaces. Patchy white matter low density in both cerebral hemispheres. Stable old right thalamic lacunar infarct. No intracranial hemorrhage, mass lesion or CT evidence of acute infarction. The anterior aspect of the left middle nasal turbinate is pneumatized. Otherwise, unremarkable bones and included paranasal sinuses. IMPRESSION: 1. No acute abnormality. 2. Stable atrophy and chronic small vessel white matter ischemic changes. 3. Stable old right thalamic lacunar infarct. Electronically Signed   By: Beckie Salts M.D.   On: 06/20/2015 10:05   Nm Gi Blood Loss  06/10/2015  CLINICAL DATA:  Lower gastrointestinal bleeding. EXAM: NUCLEAR MEDICINE GASTROINTESTINAL BLEEDING SCAN TECHNIQUE: Sequential abdominal images were obtained following intravenous administration of Tc-65m  labeled red blood cells. RADIOPHARMACEUTICALS:  21.16 mCi Tc-47m in-vitro labeled red cells. COMPARISON:  None. FINDINGS: No abnormal accumulation is seen to suggest active gastrointestinal bleeding. IMPRESSION: No scintigraphic evidence of active gastrointestinal bleeding. Electronically Signed   By: Lupita Raider, M.D.   On: 06/10/2015 10:37   US Renal  06/20/2015  CLINICAL DATA:  Urinary tract infection without hematuria EXAM: RENAL / URINARY TRACT ULTRASOUND COMPLETE COMPARISON:  None. FINDINGS: Right Kidney: Length: 9.6 cm. Echogenicity within normal limits. No mass or hydronephrosis visualized. Left Kidney: Length: 9.6 cm. Echogenicity within normal limits. No mass or hydronephrosis visualized. Bladder: Decompressed by Foley catheter and therefore not evaluated IMPRESSION: Normal renal ultrasound Electronically Signed   By: Esperanza Heir M.D.   On: 06/20/2015 13:55   Dg Chest Portable 1 View  06/20/2015  CLINICAL DATA:  EMS states pt was diagnosed with a UTI approximately x1  week ago and was given abx, which has been completed. EMS reports pt has been agitated with staff more frequently and has requested to been seen by a medical professional. Staff at patient is a nursing facility reports an elevated temperature, not noted currently. Patient is currently disoriented. EXAM: PORTABLE CHEST 1 VIEW COMPARISON:  06/14/2015 FINDINGS: Cardiac silhouette is normal in size and configuration. Aorta is mildly uncoiled. No mediastinal or hilar masses or evidence of adenopathy. Lungs are mildly hyperexpanded. Mild scarring and/ or subsegmental atelectasis at the lung bases is stable. Lungs are otherwise clear. Bony thorax is demineralized. There are advanced arthropathic changes of both shoulder joints, stable. IMPRESSION: No acute cardiopulmonary disease. Electronically Signed   By: Amie Portland M.D.   On: 06/20/2015 09:34   Dg Chest Port 1 View  06/14/2015  CLINICAL DATA:  Cough. EXAM: PORTABLE CHEST 1 VIEW COMPARISON:  June 03, 2012. FINDINGS: The heart size and mediastinal contours are within normal limits. No pneumothorax or pleural effusion is noted. Mild bibasilar subsegmental atelectasis is noted. Mild degenerative changes seen involving both glenohumeral joints. IMPRESSION: Mild bibasilar subsegmental atelectasis. Electronically Signed   By: Lupita Raider, M.D.   On: 06/14/2015 14:08   Dg Abd 2 Views  06/13/2015  CLINICAL DATA:  Subsequent encounter for several day history of rectal bleeding. EXAM: ABDOMEN - 2 VIEW COMPARISON:  None. FINDINGS: Upright film shows some probable atelectasis at the left lung base. No evidence for intraperitoneal free air. Supine film shows no gaseous bowel dilatation to suggest obstruction. Bones are diffusely demineralized. IMPRESSION: Negative. Electronically Signed   By: Kennith Center M.D.   On: 06/13/2015 08:03    Micro Results    Recent Results (from the past 240 hour(s))  Urine culture     Status: None   Collection Time: 06/20/15   4:25 AM  Result Value Ref Range Status   Specimen Description URINE, CATHETERIZED  Final   Special Requests vanco ertapenem  Final   Culture   Final    1,000 COLONIES/mL PSEUDOMONAS AERUGINOSA 80,000 COLONIES/ml CANDIDA GLABRATA    Report Status 06/27/2015 FINAL  Final   Organism ID, Bacteria PSEUDOMONAS AERUGINOSA  Final      Susceptibility   Pseudomonas aeruginosa - MIC*    CEFTAZIDIME 8 SENSITIVE Sensitive     CIPROFLOXACIN >=4 RESISTANT Resistant     GENTAMICIN 4 SENSITIVE Sensitive     IMIPENEM 2 SENSITIVE Sensitive     PIP/TAZO Value in next row Sensitive      SENSITIVE32    LEVOFLOXACIN Value in next row Resistant  RESISTANT>=8    * 1,000 COLONIES/mL PSEUDOMONAS AERUGINOSA  MRSA PCR Screening     Status: None   Collection Time: 06/21/15 11:05 AM  Result Value Ref Range Status   MRSA by PCR NEGATIVE NEGATIVE Final    Comment:        The GeneXpert MRSA Assay (FDA approved for NASAL specimens only), is one component of a comprehensive MRSA colonization surveillance program. It is not intended to diagnose MRSA infection nor to guide or monitor treatment for MRSA infections.        Today   Subjective:   [name] today  ,confused ,at baseline with dementia,  Objective:   Blood pressure 121/80, pulse 91, temperature 98.2 F (36.8 C), temperature source Oral, resp. rate 20, height 5\' 4"  (1.626 m), weight 65.726 kg (144 lb 14.4 oz), SpO2 93 %.   Intake/Output Summary (Last 24 hours) at 06/28/15 1028 Last data filed at 06/28/15 0414  Gross per 24 hour  Intake 1724.83 ml  Output    900 ml  Net 824.83 ml    Exam Awake Alert, Oriented x 3, No new F.N deficits, Normal affect [place].AT,PERRAL Supple Neck,No JVD, No cervical lymphadenopathy appriciated.  Symmetrical Chest wall movement, Good air movement bilaterally, CTAB RRR,No Gallops,Rubs or new Murmurs, No Parasternal Heave +ve B.Sounds, Abd Soft, Non tender, No organomegaly appriciated, No rebound  -guarding or rigidity. No Cyanosis, Clubbing or edema, No new Rash or bruise  Data Review   CBC w Diff: Lab Results  Component Value Date   WBC 9.7 06/24/2015   WBC 11.9* 06/04/2012   HGB 11.3* 06/24/2015   HGB 12.2 06/04/2012   HCT 33.5* 06/24/2015   HCT 34.2* 06/04/2012   PLT 346 06/28/2015   PLT 203 06/04/2012   LYMPHOPCT 17 06/21/2015   LYMPHOPCT 7.1 06/04/2012   MONOPCT 7 06/21/2015   MONOPCT 6.4 06/04/2012   EOSPCT 3 06/21/2015   EOSPCT 0.1 06/04/2012   BASOPCT 1 06/21/2015   BASOPCT 0.5 06/04/2012    CMP: Lab Results  Component Value Date   NA 140 06/25/2015   NA 142 06/04/2012   K 4.0 06/25/2015   K 3.6 06/04/2012   CL 105 06/25/2015   CL 104 06/04/2012   CO2 24 06/25/2015   CO2 28 06/04/2012   BUN 23* 06/25/2015   BUN 21* 06/04/2012   CREATININE 0.93 06/25/2015   CREATININE 0.84 06/04/2012   PROT 6.6 06/20/2015   PROT 8.3* 12/20/2011   ALBUMIN 3.0* 06/20/2015   ALBUMIN 4.1 12/20/2011   BILITOT 0.5 06/20/2015   BILITOT 0.6 12/20/2011   ALKPHOS 75 06/20/2015   ALKPHOS 86 12/20/2011   AST 22 06/20/2015   AST 20 12/20/2011   ALT 15 06/20/2015   ALT 18 12/20/2011  . Continue contact isolation for history of MRSA, ESBL.  Total Time in preparing paper work, data evaluation and todays exam - 35 minutes  Britanie Harshman M.D on 06/28/2015 at 10:28 AM    Note: This dictation was prepared with Dragon dictation along with smaller phrase technology. Any transcriptional errors that result from this process are unintentional.

## 2015-06-28 NOTE — Progress Notes (Signed)
   SUBJECTIVE: No CP or SOB   Filed Vitals:   06/27/15 0908 06/27/15 1246 06/27/15 2025 06/28/15 0414  BP: 97/61 142/48 115/74 121/80  Pulse: 83 73 88 91  Temp:   98.8 F (37.1 C) 98.2 F (36.8 C)  TempSrc:   Oral Oral  Resp:  17 22 20   Height:      Weight:      SpO2:  95% 94% 93%    Intake/Output Summary (Last 24 hours) at 06/28/15 0858 Last data filed at 06/28/15 0414  Gross per 24 hour  Intake 1724.83 ml  Output    900 ml  Net 824.83 ml    LABS: Basic Metabolic Panel: No results for input(s): NA, K, CL, CO2, GLUCOSE, BUN, CREATININE, CALCIUM, MG, PHOS in the last 72 hours. Liver Function Tests: No results for input(s): AST, ALT, ALKPHOS, BILITOT, PROT, ALBUMIN in the last 72 hours. No results for input(s): LIPASE, AMYLASE in the last 72 hours. CBC:  Recent Labs  06/28/15 0422  PLT 346   Cardiac Enzymes: No results for input(s): CKTOTAL, CKMB, CKMBINDEX, TROPONINI in the last 72 hours. BNP: Invalid input(s): POCBNP D-Dimer: No results for input(s): DDIMER in the last 72 hours. Hemoglobin A1C: No results for input(s): HGBA1C in the last 72 hours. Fasting Lipid Panel: No results for input(s): CHOL, HDL, LDLCALC, TRIG, CHOLHDL, LDLDIRECT in the last 72 hours. Thyroid Function Tests: No results for input(s): TSH, T4TOTAL, T3FREE, THYROIDAB in the last 72 hours.  Invalid input(s): FREET3 Anemia Panel: No results for input(s): VITAMINB12, FOLATE, FERRITIN, TIBC, IRON, RETICCTPCT in the last 72 hours.   PHYSICAL EXAM General: Well developed, well nourished, in no acute distress HEENT: Normocephalic and atramatic Neck: No JVD.  Lungs: Clear bilaterally to auscultation and percussion. Heart: HRRR . Normal S1 and S2 without gallops or murmurs.  Abdomen: Bowel sounds are positive, abdomen soft and non-tender  Msk: Back normal, normal gait. Normal strength and tone for age. Extremities: No clubbing, cyanosis or edema.  Neuro: Alert and oriented X  1. Psych: Good affect, responds appropriately  TELEMETRY: Reviewed telemetry pt in [name]  ASSESSMENT AND PLAN:  1. Mildly elevated troponin 2/2 demand ischemia. No CP or EKG changes. Echo shows normal LVEF and WM   Pt given f/u in office 11/8 at 2pm.    Patient and plan discussed with supervising provider, Dr. Adrian BlackwaterShaukat Khan, who agrees with above findings.   Alinda SierrasEileen A. Margarito CourserRomano, PA-C [facility]  06/28/2015 8:58 AM

## 2015-06-28 NOTE — Care Management Important Message (Signed)
Important Message  Patient Details  Name: [name] L Thoman MRN: [id number] Date of Birth: 12/17/1912   Medicare Important Message Given:  Yes-third notification given - Garnette Gunneroger Vaughn nephew POA    Eber HongGreene, Jora Galluzzo R, RN 06/28/2015, 10:12 AM

## 2015-06-28 NOTE — Progress Notes (Signed)
Patient is medically stable for D/C today to [facility]. Per [facility] liaison patient is going to room 117-A. RN will report to [name] at [phone] and arrange EMS for transport. Clinical Education officer, museum (CSW) prepared D/C packet and sent D/C Summary to [employer] via carefinder. CSW met with patient to make her aware of above however she appeared confused. CSW left patient's nephew HPOA [name] a voicemail making him aware of above. Please reconsult if future social work needs arise. CSW signing off.   [name], [place] [phone]

## 2015-06-28 NOTE — Progress Notes (Signed)
Patient d/c'd to [facility] resources. Education provided, no questions at this time. Patient to be picked up by EMS. Telemetry removed. Report given to Algon[place]y. Trudee KusterBrandi R [place]

## 2015-06-28 NOTE — Clinical Social Work Placement (Signed)
   CLINICAL SOCIAL WORK PLACEMENT  NOTE  Date:  06/28/2015  Patient Details  Name: [name] MRN: [id number] Date of Birth: 05-Aug-1913  Clinical Social Work is seeking post-discharge placement for this patient at the Skilled  Nursing Facility level of care (*CSW will initial, date and re-position this form in  chart as items are completed):  Yes   Patient/family provided with [facility] Clinical Social Work Department's list of facilities offering this level of care within the geographic area requested by the patient (or if unable, by the patient's family).  Yes   Patient/family informed of their freedom to choose among providers that offer the needed level of care, that participate in Medicare, Medicaid or managed care program needed by the patient, have an available bed and are willing to accept the patient.  Yes   Patient/family informed of [facility] ownership interest [facility] and [facility], as well as of the fact that they are under no obligation to receive care at these facilities.  PASRR submitted to EDS on       PASRR number received on       Existing PASRR number confirmed on 06/28/15     FL2 transmitted to all facilities in geographic area requested by pt/family on 06/20/15     FL2 transmitted to all facilities within larger geographic area on       Patient informed that his/her managed care company has contracts with or will negotiate with certain facilities, including the following:        Yes   Patient/family informed of bed offers received.  Patient chooses bed at  ([facility] )     Physician recommends and patient chooses bed at      Patient to be transferred to  ([facility] ) on 06/28/15.  Patient to be transferred to facility by  [facility]     Patient family notified on 06/28/15 of transfer.  Name of family member notified:   (Nephew Fredrik CoveRoger was made aware of D/C via voicemail )     PHYSICIAN       Additional Comment:     _______________________________________________ Haig ProphetMorgan, Captain Blucher G, LCSW 06/28/2015, 12:10 PM

## 2015-08-28 DEATH — deceased

## 2016-10-27 IMAGING — NM NM GI BLOOD LOSS
2 series · 12 of 12 positions shown · non-contrast
Comparison: None.

CLINICAL DATA: Lower gastrointestinal bleeding.

EXAM:
NUCLEAR MEDICINE GASTROINTESTINAL BLEEDING SCAN
TECHNIQUE: Sequential abdominal images were obtained following intravenous
administration of Nc-77m labeled red blood cells.
RADIOPHARMACEUTICALS:  21.16 mCi Nc-77m in-vitro labeled red cells.

[Series 1000: gi bleed hour 2 · 4.80mm/px · 6 of 60 frames shown]
[frame 6/60]
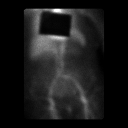
[frame 16/60]
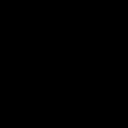
[frame 26/60]
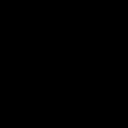
[frame 36/60]
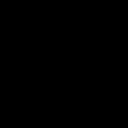
[frame 46/60]
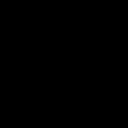
[frame 56/60]
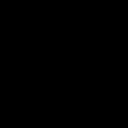

[Series 1000: hour 1 gi bleed · 4.80mm/px · 6 of 60 frames shown]
[frame 6/60]
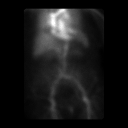
[frame 16/60]
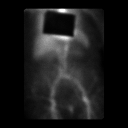
[frame 26/60]
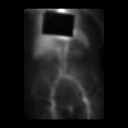
[frame 36/60]
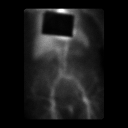
[frame 46/60]
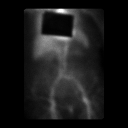
[frame 56/60]
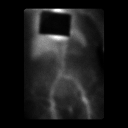

[12 of 12 positions shown; findings below may reference images not displayed]

FINDINGS: No abnormal accumulation is seen to suggest active gastrointestinal
bleeding.
IMPRESSION: No scintigraphic evidence of active gastrointestinal bleeding.

## 2017-01-08 IMAGING — US US RENAL
1 series · 14 of 22 positions shown · non-contrast
Comparison: None.

CLINICAL DATA: Urinary tract infection without hematuria

EXAM:
RENAL / URINARY TRACT ULTRASOUND COMPLETE

[Series 1: us renal · 0.24mm/px · 14 of 22 slices shown]
[im 1/22]
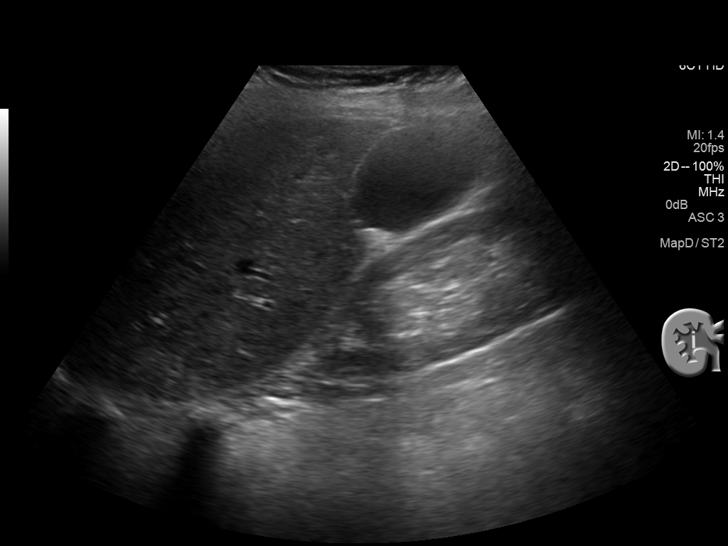
[im 3/22]
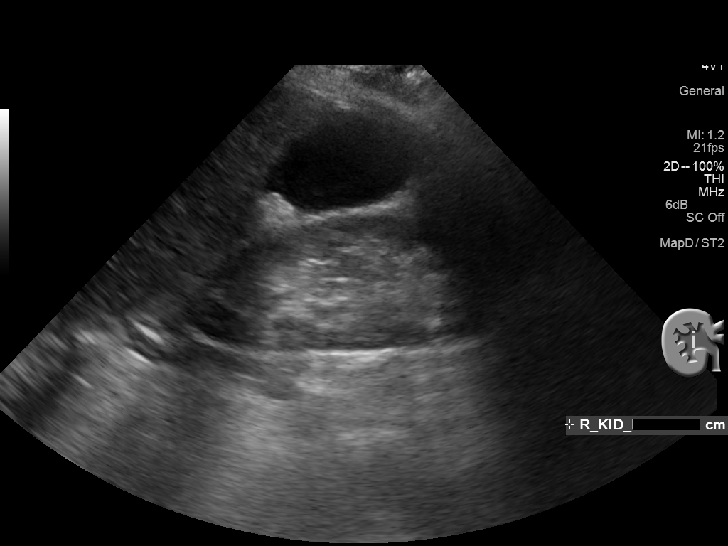
[im 4/22]
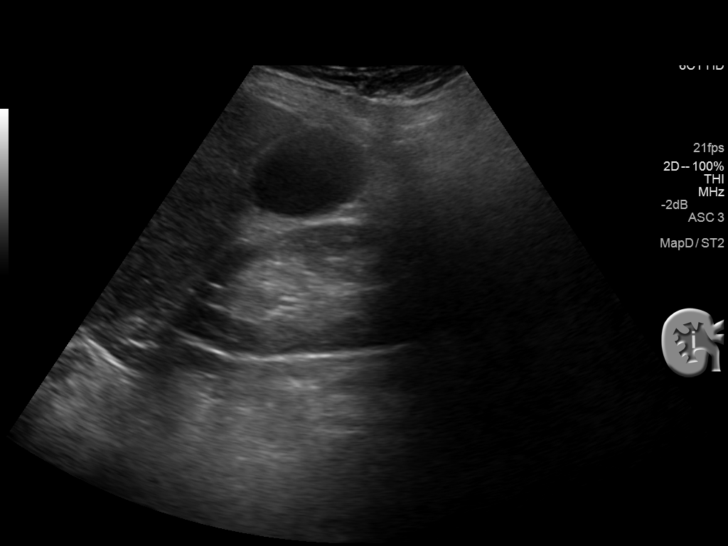
[im 6/22]
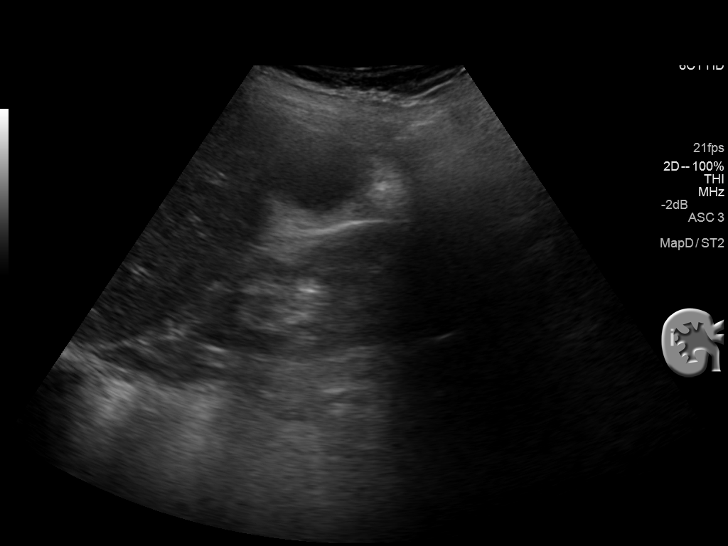
[im 8/22]
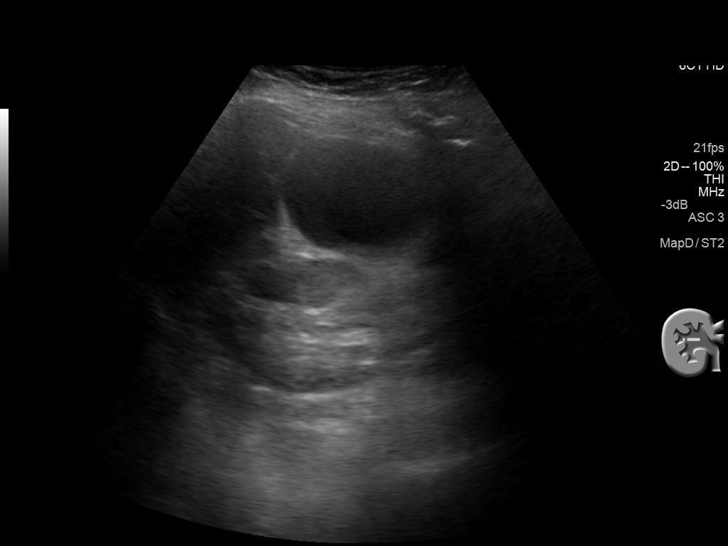
[im 9/22]
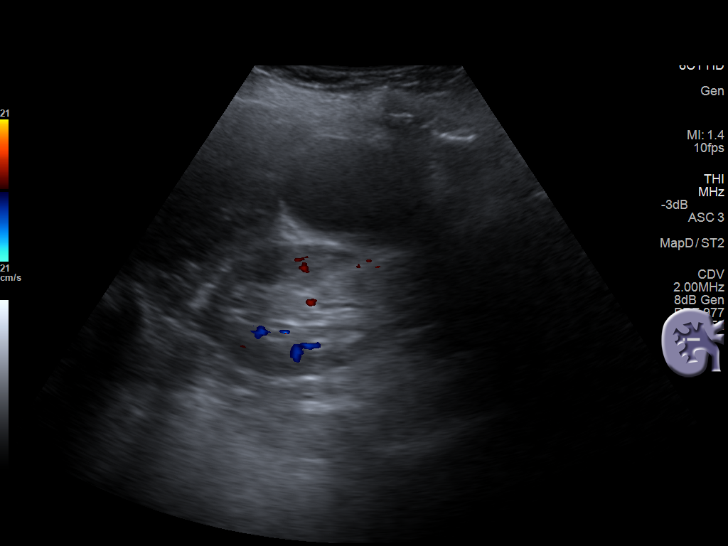
[im 11/22]
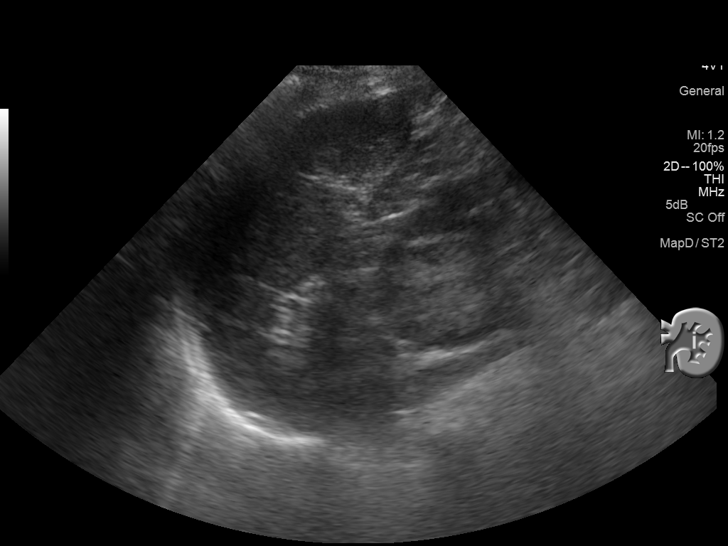
[im 12/22]
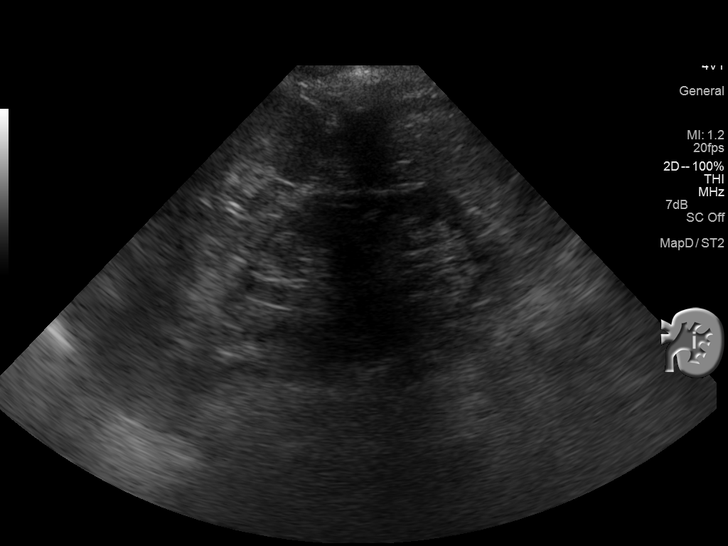
[im 14/22]
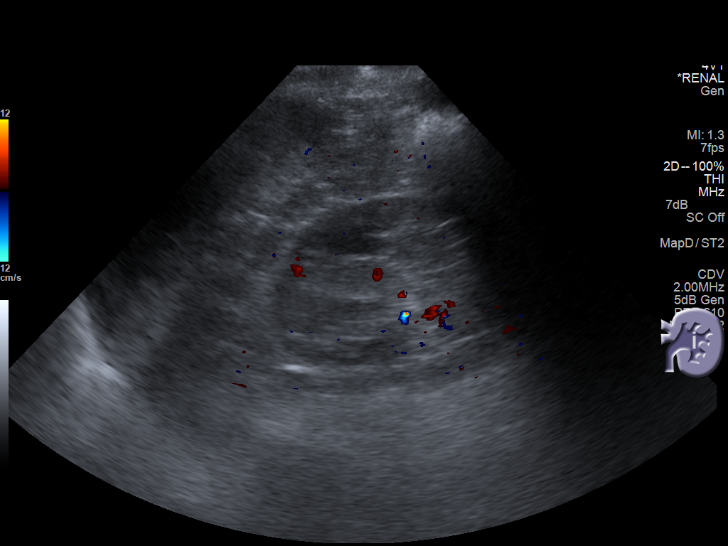
[im 15/22]
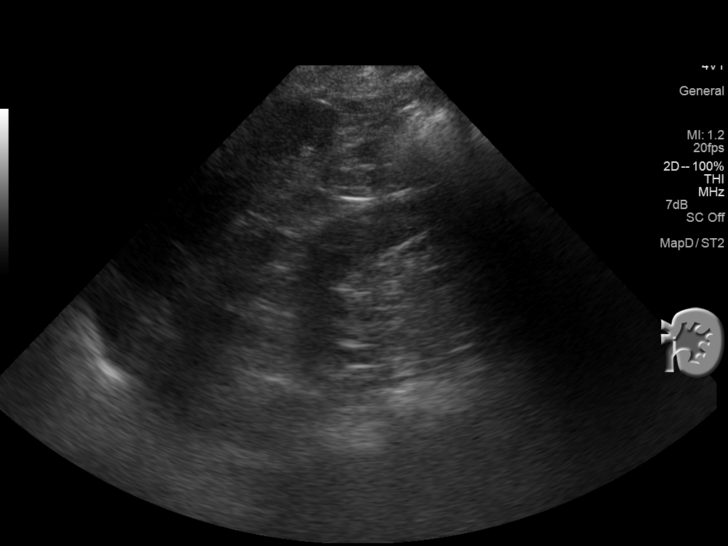
[im 17/22]
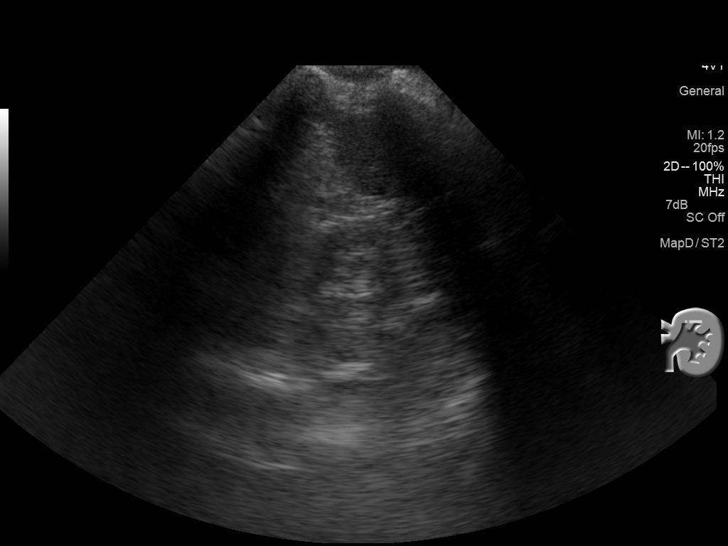
[im 19/22]
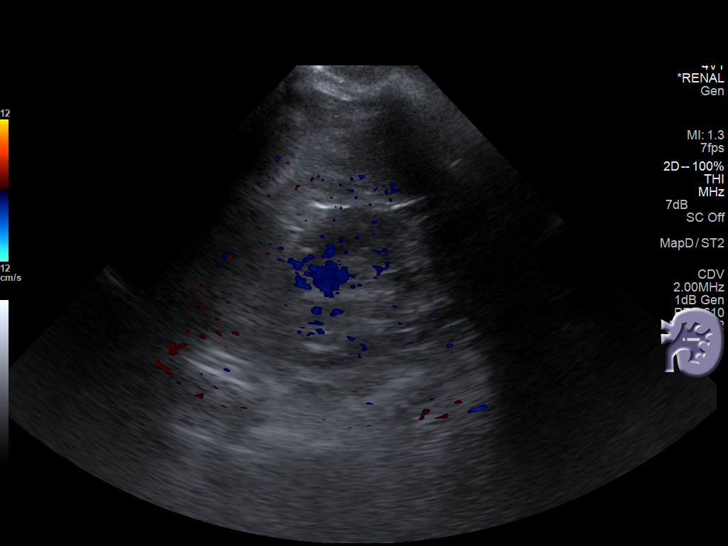
[im 20/22]
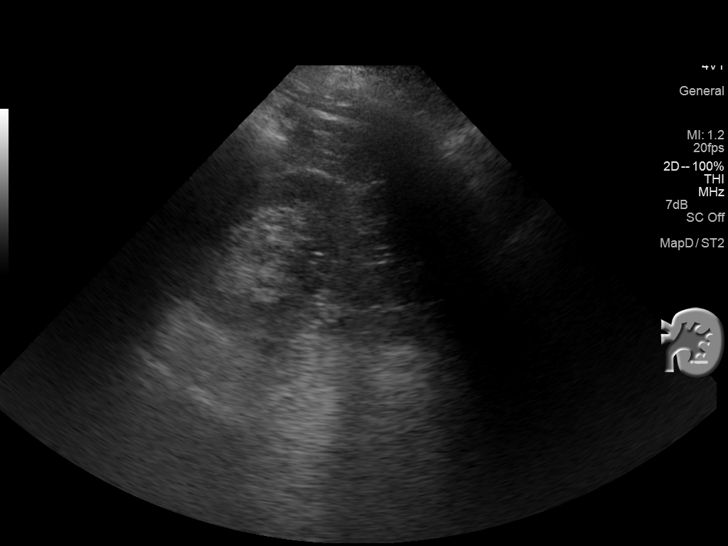
[im 22/22]
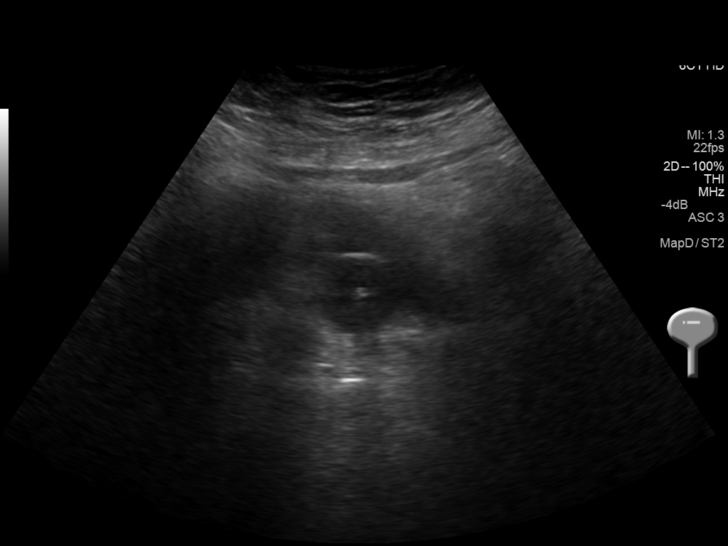

[14 of 22 positions shown; findings below may reference images not displayed]

FINDINGS: Right Kidney:

Length: 9.6 cm. Echogenicity within normal limits. No mass or
hydronephrosis visualized.

Left Kidney:

Length: 9.6 cm. Echogenicity within normal limits. No mass or
hydronephrosis visualized.

Bladder:

Decompressed by Foley catheter and therefore not evaluated
IMPRESSION: Normal renal ultrasound
